# Patient Record
Sex: Female | Born: 1935 | Race: White | Hispanic: No | Smoking: Never smoker
Health system: Southern US, Community
[De-identification: ages and names within clinical notes are randomized; demographics above are authoritative.]

## PROBLEM LIST (undated history)

## (undated) DIAGNOSIS — F419 Anxiety disorder, unspecified: Secondary | ICD-10-CM

## (undated) DIAGNOSIS — I4729 Other ventricular tachycardia: Secondary | ICD-10-CM

## (undated) DIAGNOSIS — I5022 Chronic systolic (congestive) heart failure: Secondary | ICD-10-CM

## (undated) DIAGNOSIS — I472 Ventricular tachycardia, unspecified: Secondary | ICD-10-CM

## (undated) DIAGNOSIS — I255 Ischemic cardiomyopathy: Secondary | ICD-10-CM

## (undated) DIAGNOSIS — I10 Essential (primary) hypertension: Secondary | ICD-10-CM

## (undated) DIAGNOSIS — E785 Hyperlipidemia, unspecified: Secondary | ICD-10-CM

## (undated) DIAGNOSIS — K222 Esophageal obstruction: Secondary | ICD-10-CM

## (undated) DIAGNOSIS — I2510 Atherosclerotic heart disease of native coronary artery without angina pectoris: Secondary | ICD-10-CM

## (undated) DIAGNOSIS — M1990 Unspecified osteoarthritis, unspecified site: Secondary | ICD-10-CM

## (undated) DIAGNOSIS — E039 Hypothyroidism, unspecified: Secondary | ICD-10-CM

## (undated) DIAGNOSIS — K449 Diaphragmatic hernia without obstruction or gangrene: Secondary | ICD-10-CM

## (undated) DIAGNOSIS — Z950 Presence of cardiac pacemaker: Secondary | ICD-10-CM

## (undated) DIAGNOSIS — G4730 Sleep apnea, unspecified: Secondary | ICD-10-CM

## (undated) DIAGNOSIS — K5790 Diverticulosis of intestine, part unspecified, without perforation or abscess without bleeding: Secondary | ICD-10-CM

## (undated) DIAGNOSIS — I447 Left bundle-branch block, unspecified: Secondary | ICD-10-CM

## (undated) DIAGNOSIS — K219 Gastro-esophageal reflux disease without esophagitis: Secondary | ICD-10-CM

## (undated) DIAGNOSIS — N390 Urinary tract infection, site not specified: Secondary | ICD-10-CM

## (undated) HISTORY — PX: CARDIAC DEFIBRILLATOR PLACEMENT: SHX171

## (undated) HISTORY — PX: TONSILLECTOMY: SUR1361

## (undated) HISTORY — DX: Gastro-esophageal reflux disease without esophagitis: K21.9

## (undated) HISTORY — DX: Diverticulosis of intestine, part unspecified, without perforation or abscess without bleeding: K57.90

## (undated) HISTORY — DX: Hyperlipidemia, unspecified: E78.5

## (undated) HISTORY — DX: Esophageal obstruction: K22.2

## (undated) HISTORY — DX: Essential (primary) hypertension: I10

## (undated) HISTORY — PX: ANGIOPLASTY: SHX39

## (undated) HISTORY — PX: COLONOSCOPY W/ POLYPECTOMY: SHX1380

## (undated) HISTORY — DX: Unspecified osteoarthritis, unspecified site: M19.90

## (undated) HISTORY — PX: APPENDECTOMY: SHX54

## (undated) HISTORY — PX: TOTAL ABDOMINAL HYSTERECTOMY: SHX209

## (undated) HISTORY — PX: CATARACT EXTRACTION, BILATERAL: SHX1313

## (undated) HISTORY — DX: Ischemic cardiomyopathy: I25.5

## (undated) HISTORY — DX: Other ventricular tachycardia: I47.29

## (undated) HISTORY — PX: TOTAL KNEE ARTHROPLASTY: SHX125

## (undated) HISTORY — DX: Atherosclerotic heart disease of native coronary artery without angina pectoris: I25.10

## (undated) HISTORY — DX: Ventricular tachycardia, unspecified: I47.20

## (undated) HISTORY — PX: INSERT / REPLACE / REMOVE PACEMAKER: SUR710

## (undated) HISTORY — DX: Urinary tract infection, site not specified: N39.0

## (undated) HISTORY — DX: Ventricular tachycardia: I47.2

## (undated) HISTORY — PX: TUBAL LIGATION: SHX77

## (undated) HISTORY — DX: Diaphragmatic hernia without obstruction or gangrene: K44.9

## (undated) HISTORY — DX: Sleep apnea, unspecified: G47.30

## (undated) HISTORY — PX: CARDIAC CATHETERIZATION: SHX172

---

## 1997-06-01 ENCOUNTER — Inpatient Hospital Stay (HOSPITAL_COMMUNITY): Admission: RE | Admit: 1997-06-01 | Discharge: 1997-06-04 | Payer: Self-pay | Admitting: Orthopedic Surgery

## 1997-06-04 ENCOUNTER — Inpatient Hospital Stay (HOSPITAL_COMMUNITY)
Admission: RE | Admit: 1997-06-04 | Discharge: 1997-06-09 | Payer: Self-pay | Admitting: Physical Medicine and Rehabilitation

## 1997-06-14 ENCOUNTER — Other Ambulatory Visit: Admission: RE | Admit: 1997-06-14 | Discharge: 1997-06-14 | Payer: Self-pay | Admitting: Emergency Medicine

## 1997-06-18 ENCOUNTER — Other Ambulatory Visit: Admission: RE | Admit: 1997-06-18 | Discharge: 1997-06-18 | Payer: Self-pay

## 1997-09-22 ENCOUNTER — Ambulatory Visit (HOSPITAL_COMMUNITY): Admission: RE | Admit: 1997-09-22 | Discharge: 1997-09-23 | Payer: Self-pay | Admitting: Cardiology

## 1998-10-05 ENCOUNTER — Emergency Department (HOSPITAL_COMMUNITY): Admission: EM | Admit: 1998-10-05 | Discharge: 1998-10-05 | Payer: Self-pay | Admitting: Emergency Medicine

## 1998-10-06 ENCOUNTER — Encounter: Payer: Self-pay | Admitting: Emergency Medicine

## 1999-07-14 ENCOUNTER — Encounter: Payer: Self-pay | Admitting: Emergency Medicine

## 1999-07-14 ENCOUNTER — Encounter: Admission: RE | Admit: 1999-07-14 | Discharge: 1999-07-14 | Payer: Self-pay | Admitting: Emergency Medicine

## 2001-04-28 ENCOUNTER — Encounter: Admission: RE | Admit: 2001-04-28 | Discharge: 2001-04-28 | Payer: Self-pay | Admitting: Cardiology

## 2001-04-28 ENCOUNTER — Encounter: Payer: Self-pay | Admitting: Cardiology

## 2001-05-15 ENCOUNTER — Ambulatory Visit (HOSPITAL_COMMUNITY): Admission: RE | Admit: 2001-05-15 | Discharge: 2001-05-16 | Payer: Self-pay | Admitting: Cardiology

## 2001-05-30 ENCOUNTER — Ambulatory Visit (HOSPITAL_COMMUNITY): Admission: RE | Admit: 2001-05-30 | Discharge: 2001-05-30 | Payer: Self-pay | Admitting: Internal Medicine

## 2001-06-03 ENCOUNTER — Ambulatory Visit (HOSPITAL_COMMUNITY): Admission: RE | Admit: 2001-06-03 | Discharge: 2001-06-03 | Payer: Self-pay | Admitting: Internal Medicine

## 2001-06-03 ENCOUNTER — Encounter (INDEPENDENT_AMBULATORY_CARE_PROVIDER_SITE_OTHER): Payer: Self-pay | Admitting: *Deleted

## 2002-01-30 ENCOUNTER — Encounter: Payer: Self-pay | Admitting: Orthopedic Surgery

## 2002-02-03 ENCOUNTER — Inpatient Hospital Stay (HOSPITAL_COMMUNITY): Admission: RE | Admit: 2002-02-03 | Discharge: 2002-02-06 | Payer: Self-pay | Admitting: Orthopedic Surgery

## 2002-02-06 ENCOUNTER — Encounter: Payer: Self-pay | Admitting: Orthopedic Surgery

## 2002-05-12 ENCOUNTER — Encounter: Payer: Self-pay | Admitting: Emergency Medicine

## 2002-05-12 ENCOUNTER — Encounter: Admission: RE | Admit: 2002-05-12 | Discharge: 2002-05-12 | Payer: Self-pay | Admitting: Emergency Medicine

## 2002-10-09 ENCOUNTER — Encounter: Admission: RE | Admit: 2002-10-09 | Discharge: 2002-10-09 | Payer: Self-pay | Admitting: Emergency Medicine

## 2002-10-09 ENCOUNTER — Encounter: Payer: Self-pay | Admitting: Emergency Medicine

## 2002-11-04 ENCOUNTER — Encounter: Payer: Self-pay | Admitting: Internal Medicine

## 2002-11-04 ENCOUNTER — Ambulatory Visit (HOSPITAL_COMMUNITY): Admission: RE | Admit: 2002-11-04 | Discharge: 2002-11-04 | Payer: Self-pay | Admitting: Internal Medicine

## 2003-10-14 ENCOUNTER — Inpatient Hospital Stay (HOSPITAL_COMMUNITY): Admission: EM | Admit: 2003-10-14 | Discharge: 2003-10-16 | Payer: Self-pay

## 2003-11-16 ENCOUNTER — Ambulatory Visit: Payer: Self-pay | Admitting: Internal Medicine

## 2003-11-22 ENCOUNTER — Ambulatory Visit: Payer: Self-pay | Admitting: Internal Medicine

## 2003-12-10 ENCOUNTER — Ambulatory Visit: Payer: Self-pay | Admitting: Cardiology

## 2003-12-13 ENCOUNTER — Ambulatory Visit: Payer: Self-pay | Admitting: Internal Medicine

## 2003-12-14 ENCOUNTER — Inpatient Hospital Stay (HOSPITAL_COMMUNITY): Admission: AD | Admit: 2003-12-14 | Discharge: 2003-12-15 | Payer: Self-pay | Admitting: Internal Medicine

## 2003-12-22 ENCOUNTER — Ambulatory Visit: Payer: Self-pay | Admitting: Cardiology

## 2003-12-27 ENCOUNTER — Ambulatory Visit: Payer: Self-pay

## 2003-12-27 ENCOUNTER — Ambulatory Visit: Payer: Self-pay | Admitting: Internal Medicine

## 2004-02-23 ENCOUNTER — Ambulatory Visit: Payer: Self-pay | Admitting: Cardiology

## 2004-03-22 ENCOUNTER — Ambulatory Visit: Payer: Self-pay | Admitting: Internal Medicine

## 2004-04-14 ENCOUNTER — Ambulatory Visit: Payer: Self-pay

## 2004-07-13 ENCOUNTER — Ambulatory Visit: Payer: Self-pay | Admitting: Cardiology

## 2004-07-26 ENCOUNTER — Ambulatory Visit: Payer: Self-pay

## 2004-08-31 ENCOUNTER — Encounter (INDEPENDENT_AMBULATORY_CARE_PROVIDER_SITE_OTHER): Payer: Self-pay | Admitting: *Deleted

## 2004-08-31 ENCOUNTER — Encounter: Admission: RE | Admit: 2004-08-31 | Discharge: 2004-08-31 | Payer: Self-pay | Admitting: Emergency Medicine

## 2004-09-22 ENCOUNTER — Ambulatory Visit: Payer: Self-pay | Admitting: Cardiology

## 2004-10-06 ENCOUNTER — Ambulatory Visit: Payer: Self-pay | Admitting: Cardiology

## 2004-10-13 ENCOUNTER — Ambulatory Visit: Payer: Self-pay | Admitting: Cardiology

## 2004-10-20 ENCOUNTER — Ambulatory Visit: Payer: Self-pay | Admitting: Internal Medicine

## 2004-10-20 ENCOUNTER — Ambulatory Visit: Payer: Self-pay | Admitting: Cardiology

## 2004-11-03 ENCOUNTER — Ambulatory Visit: Payer: Self-pay | Admitting: Internal Medicine

## 2004-11-15 ENCOUNTER — Ambulatory Visit: Payer: Self-pay | Admitting: Cardiology

## 2004-12-01 ENCOUNTER — Ambulatory Visit: Payer: Self-pay | Admitting: Cardiology

## 2005-01-19 ENCOUNTER — Encounter: Admission: RE | Admit: 2005-01-19 | Discharge: 2005-01-19 | Payer: Self-pay | Admitting: Emergency Medicine

## 2005-01-19 ENCOUNTER — Encounter (INDEPENDENT_AMBULATORY_CARE_PROVIDER_SITE_OTHER): Payer: Self-pay | Admitting: *Deleted

## 2005-03-21 ENCOUNTER — Ambulatory Visit: Payer: Self-pay | Admitting: Cardiology

## 2005-03-30 ENCOUNTER — Ambulatory Visit: Payer: Self-pay | Admitting: Cardiology

## 2005-05-02 ENCOUNTER — Ambulatory Visit: Payer: Self-pay | Admitting: Cardiology

## 2005-06-14 ENCOUNTER — Ambulatory Visit: Payer: Self-pay | Admitting: Internal Medicine

## 2005-09-03 ENCOUNTER — Ambulatory Visit: Payer: Self-pay | Admitting: Cardiology

## 2005-11-19 ENCOUNTER — Ambulatory Visit: Payer: Self-pay | Admitting: Internal Medicine

## 2005-11-20 ENCOUNTER — Ambulatory Visit: Payer: Self-pay | Admitting: Internal Medicine

## 2005-12-20 ENCOUNTER — Ambulatory Visit: Payer: Self-pay | Admitting: Internal Medicine

## 2006-02-14 ENCOUNTER — Ambulatory Visit: Payer: Self-pay | Admitting: Internal Medicine

## 2006-02-14 ENCOUNTER — Ambulatory Visit: Payer: Self-pay | Admitting: Cardiology

## 2006-02-28 ENCOUNTER — Ambulatory Visit: Payer: Self-pay | Admitting: *Deleted

## 2006-02-28 LAB — CONVERTED CEMR LAB
ALT: 14 units/L (ref 0–40)
AST: 19 units/L (ref 0–37)
Albumin: 3.6 g/dL (ref 3.5–5.2)
Alkaline Phosphatase: 124 units/L — ABNORMAL HIGH (ref 39–117)
BUN: 32 mg/dL — ABNORMAL HIGH (ref 6–23)
Basophils Absolute: 0.1 10*3/uL (ref 0.0–0.1)
Basophils Relative: 0.8 % (ref 0.0–1.0)
Bilirubin, Direct: 0.1 mg/dL (ref 0.0–0.3)
CO2: 29 meq/L (ref 19–32)
Calcium: 9.9 mg/dL (ref 8.4–10.5)
Chloride: 106 meq/L (ref 96–112)
Cholesterol: 124 mg/dL (ref 0–200)
Creatinine, Ser: 1.1 mg/dL (ref 0.4–1.2)
Eosinophils Absolute: 0.5 10*3/uL (ref 0.0–0.6)
Eosinophils Relative: 5.7 % — ABNORMAL HIGH (ref 0.0–5.0)
GFR calc Af Amer: 61 mL/min
GFR calc non Af Amer: 51 mL/min
Glucose, Bld: 121 mg/dL — ABNORMAL HIGH (ref 70–99)
HCT: 31.7 % — ABNORMAL LOW (ref 36.0–46.0)
HDL: 31.1 mg/dL — ABNORMAL LOW (ref >39.0)
Hemoglobin: 10.8 g/dL — ABNORMAL LOW (ref 12.0–15.0)
LDL Cholesterol: 59 mg/dL (ref 0–99)
Lymphocytes Relative: 21 % (ref 12.0–46.0)
MCHC: 34.1 g/dL (ref 30.0–36.0)
MCV: 85.7 fL (ref 78.0–100.0)
Monocytes Absolute: 0.9 10*3/uL — ABNORMAL HIGH (ref 0.2–0.7)
Monocytes Relative: 11 % (ref 3.0–11.0)
Neutro Abs: 4.9 10*3/uL (ref 1.4–7.7)
Neutrophils Relative %: 61.5 % (ref 43.0–77.0)
Platelets: 247 10*3/uL (ref 150–400)
Potassium: 4.6 meq/L (ref 3.5–5.1)
Pro B Natriuretic peptide (BNP): 268 pg/mL — ABNORMAL HIGH (ref 0.0–100.0)
RBC: 3.71 M/uL — ABNORMAL LOW (ref 3.87–5.11)
RDW: 12.5 % (ref 11.5–14.6)
Sodium: 142 meq/L (ref 135–145)
TSH: 0.79 microintl units/mL (ref 0.35–5.50)
Total Bilirubin: 0.6 mg/dL (ref 0.3–1.2)
Total CHOL/HDL Ratio: 4
Total Protein: 6.4 g/dL (ref 6.0–8.3)
Triglycerides: 170 mg/dL — ABNORMAL HIGH (ref 0–149)
VLDL: 34 mg/dL (ref 0–40)
WBC: 8.1 10*3/uL (ref 4.5–10.5)

## 2006-05-15 ENCOUNTER — Ambulatory Visit: Payer: Self-pay | Admitting: *Deleted

## 2006-05-15 LAB — CONVERTED CEMR LAB
BUN: 29 mg/dL — ABNORMAL HIGH (ref 6–23)
Basophils Absolute: 0.1 10*3/uL (ref 0.0–0.1)
Basophils Relative: 1.5 % — ABNORMAL HIGH (ref 0.0–1.0)
CO2: 31 meq/L (ref 19–32)
Calcium: 9.9 mg/dL (ref 8.4–10.5)
Chloride: 108 meq/L (ref 96–112)
Creatinine, Ser: 0.9 mg/dL (ref 0.4–1.2)
Eosinophils Absolute: 0.6 10*3/uL (ref 0.0–0.6)
Eosinophils Relative: 9 % — ABNORMAL HIGH (ref 0.0–5.0)
GFR calc Af Amer: 77 mL/min
GFR calc non Af Amer: 64 mL/min
Glucose, Bld: 89 mg/dL (ref 70–99)
HCT: 32.7 % — ABNORMAL LOW (ref 36.0–46.0)
Hemoglobin: 11.1 g/dL — ABNORMAL LOW (ref 12.0–15.0)
Iron: 73 ug/dL (ref 42–145)
Lymphocytes Relative: 30.6 % (ref 12.0–46.0)
MCHC: 33.9 g/dL (ref 30.0–36.0)
MCV: 87 fL (ref 78.0–100.0)
Monocytes Absolute: 0.7 10*3/uL (ref 0.2–0.7)
Monocytes Relative: 11.2 % — ABNORMAL HIGH (ref 3.0–11.0)
Neutro Abs: 3 10*3/uL (ref 1.4–7.7)
Neutrophils Relative %: 47.7 % (ref 43.0–77.0)
Platelets: 294 10*3/uL (ref 150–400)
Potassium: 4.3 meq/L (ref 3.5–5.1)
RBC: 3.76 M/uL — ABNORMAL LOW (ref 3.87–5.11)
RDW: 12.8 % (ref 11.5–14.6)
Saturation Ratios: 23.8 % (ref 20.0–50.0)
Sodium: 144 meq/L (ref 135–145)
Transferrin: 218.7 mg/dL (ref >=212.0)
WBC: 6.4 10*3/uL (ref 4.5–10.5)

## 2006-06-05 ENCOUNTER — Encounter: Payer: Self-pay | Admitting: Cardiology

## 2006-06-05 ENCOUNTER — Ambulatory Visit: Payer: Self-pay

## 2006-06-07 ENCOUNTER — Ambulatory Visit: Payer: Self-pay

## 2006-06-12 ENCOUNTER — Ambulatory Visit: Payer: Self-pay | Admitting: Internal Medicine

## 2006-06-12 LAB — CONVERTED CEMR LAB
BUN: 51 mg/dL — ABNORMAL HIGH (ref 6–23)
CO2: 32 meq/L (ref 19–32)
Calcium: 9.7 mg/dL (ref 8.4–10.5)
Chloride: 100 meq/L (ref 96–112)
Creatinine, Ser: 1.3 mg/dL — ABNORMAL HIGH (ref 0.4–1.2)
GFR calc Af Amer: 50 mL/min
GFR calc non Af Amer: 42 mL/min
Glucose, Bld: 112 mg/dL — ABNORMAL HIGH (ref 70–99)
Potassium: 4.5 meq/L (ref 3.5–5.1)
Sodium: 138 meq/L (ref 135–145)

## 2006-07-09 ENCOUNTER — Ambulatory Visit: Payer: Self-pay | Admitting: Cardiology

## 2006-07-09 LAB — CONVERTED CEMR LAB
BUN: 47 mg/dL — ABNORMAL HIGH (ref 6–23)
Basophils Absolute: 0.1 10*3/uL (ref 0.0–0.1)
Basophils Relative: 0.8 % (ref 0.0–1.0)
CO2: 31 meq/L (ref 19–32)
Calcium: 9.1 mg/dL (ref 8.4–10.5)
Chloride: 104 meq/L (ref 96–112)
Creatinine, Ser: 1.3 mg/dL — ABNORMAL HIGH (ref 0.4–1.2)
Eosinophils Absolute: 0.6 10*3/uL (ref 0.0–0.6)
Eosinophils Relative: 7.6 % — ABNORMAL HIGH (ref 0.0–5.0)
GFR calc Af Amer: 50 mL/min
GFR calc non Af Amer: 42 mL/min
Glucose, Bld: 118 mg/dL — ABNORMAL HIGH (ref 70–99)
HCT: 31 % — ABNORMAL LOW (ref 36.0–46.0)
Hemoglobin: 10.4 g/dL — ABNORMAL LOW (ref 12.0–15.0)
Lymphocytes Relative: 21.8 % (ref 12.0–46.0)
MCHC: 33.5 g/dL (ref 30.0–36.0)
MCV: 85.6 fL (ref 78.0–100.0)
Monocytes Absolute: 0.7 10*3/uL (ref 0.2–0.7)
Monocytes Relative: 9.2 % (ref 3.0–11.0)
Neutro Abs: 4.8 10*3/uL (ref 1.4–7.7)
Neutrophils Relative %: 60.6 % (ref 43.0–77.0)
Platelets: 297 10*3/uL (ref 150–400)
Potassium: 4.1 meq/L (ref 3.5–5.1)
Pro B Natriuretic peptide (BNP): 153 pg/mL — ABNORMAL HIGH (ref 0.0–100.0)
RBC: 3.62 M/uL — ABNORMAL LOW (ref 3.87–5.11)
RDW: 12.3 % (ref 11.5–14.6)
Sodium: 140 meq/L (ref 135–145)
WBC: 7.9 10*3/uL (ref 4.5–10.5)

## 2006-08-19 ENCOUNTER — Ambulatory Visit: Payer: Self-pay

## 2006-09-13 ENCOUNTER — Ambulatory Visit: Payer: Self-pay | Admitting: Internal Medicine

## 2006-11-06 ENCOUNTER — Ambulatory Visit: Payer: Self-pay | Admitting: Cardiology

## 2007-01-30 ENCOUNTER — Ambulatory Visit: Payer: Self-pay | Admitting: Internal Medicine

## 2007-02-11 ENCOUNTER — Ambulatory Visit: Payer: Self-pay | Admitting: Cardiology

## 2007-02-11 LAB — CONVERTED CEMR LAB
BUN: 34 mg/dL — ABNORMAL HIGH (ref 6–23)
Basophils Absolute: 0.1 10*3/uL (ref 0.0–0.1)
Basophils Relative: 0.9 % (ref 0.0–1.0)
CO2: 30 meq/L (ref 19–32)
Calcium: 10.1 mg/dL (ref 8.4–10.5)
Chloride: 104 meq/L (ref 96–112)
Creatinine, Ser: 1.2 mg/dL (ref 0.4–1.2)
Eosinophils Absolute: 0.5 10*3/uL (ref 0.0–0.6)
Eosinophils Relative: 6.6 % — ABNORMAL HIGH (ref 0.0–5.0)
GFR calc Af Amer: 55 mL/min
GFR calc non Af Amer: 46 mL/min
Glucose, Bld: 114 mg/dL — ABNORMAL HIGH (ref 70–99)
HCT: 34.9 % — ABNORMAL LOW (ref 36.0–46.0)
Hemoglobin: 11.4 g/dL — ABNORMAL LOW (ref 12.0–15.0)
Lymphocytes Relative: 29.7 % (ref 12.0–46.0)
MCHC: 32.6 g/dL (ref 30.0–36.0)
MCV: 87.4 fL (ref 78.0–100.0)
Monocytes Absolute: 0.9 10*3/uL — ABNORMAL HIGH (ref 0.2–0.7)
Monocytes Relative: 12.1 % — ABNORMAL HIGH (ref 3.0–11.0)
Neutro Abs: 3.5 10*3/uL (ref 1.4–7.7)
Neutrophils Relative %: 50.7 % (ref 43.0–77.0)
Platelets: 227 10*3/uL (ref 150–400)
Potassium: 4.2 meq/L (ref 3.5–5.1)
RBC: 3.99 M/uL (ref 3.87–5.11)
RDW: 12.4 % (ref 11.5–14.6)
Sodium: 141 meq/L (ref 135–145)
WBC: 7.1 10*3/uL (ref 4.5–10.5)

## 2007-05-08 ENCOUNTER — Ambulatory Visit: Payer: Self-pay | Admitting: Internal Medicine

## 2007-07-10 ENCOUNTER — Ambulatory Visit: Payer: Self-pay | Admitting: Internal Medicine

## 2007-08-12 ENCOUNTER — Ambulatory Visit: Payer: Self-pay | Admitting: Cardiology

## 2007-08-12 LAB — CONVERTED CEMR LAB
BUN: 17 mg/dL (ref 6–23)
Basophils Absolute: 0.1 10*3/uL (ref 0.0–0.1)
Basophils Relative: 0.9 % (ref 0.0–3.0)
CO2: 29 meq/L (ref 19–32)
Calcium: 9.9 mg/dL (ref 8.4–10.5)
Chloride: 106 meq/L (ref 96–112)
Creatinine, Ser: 0.9 mg/dL (ref 0.4–1.2)
Eosinophils Absolute: 0.3 10*3/uL (ref 0.0–0.7)
Eosinophils Relative: 5.2 % — ABNORMAL HIGH (ref 0.0–5.0)
GFR calc Af Amer: 77 mL/min
GFR calc non Af Amer: 63 mL/min
Glucose, Bld: 116 mg/dL — ABNORMAL HIGH (ref 70–99)
HCT: 33.6 % — ABNORMAL LOW (ref 36.0–46.0)
Hemoglobin: 11.6 g/dL — ABNORMAL LOW (ref 12.0–15.0)
Lymphocytes Relative: 24.7 % (ref 12.0–46.0)
MCHC: 34.6 g/dL (ref 30.0–36.0)
MCV: 88.2 fL (ref 78.0–100.0)
Monocytes Absolute: 0.6 10*3/uL (ref 0.1–1.0)
Monocytes Relative: 10.1 % (ref 3.0–12.0)
Neutro Abs: 3.4 10*3/uL (ref 1.4–7.7)
Neutrophils Relative %: 59.1 % (ref 43.0–77.0)
Platelets: 236 10*3/uL (ref 150–400)
Potassium: 4.6 meq/L (ref 3.5–5.1)
RBC: 3.81 M/uL — ABNORMAL LOW (ref 3.87–5.11)
RDW: 12.4 % (ref 11.5–14.6)
Sodium: 141 meq/L (ref 135–145)
WBC: 5.8 10*3/uL (ref 4.5–10.5)

## 2007-10-22 DIAGNOSIS — E785 Hyperlipidemia, unspecified: Secondary | ICD-10-CM

## 2007-10-22 DIAGNOSIS — I10 Essential (primary) hypertension: Secondary | ICD-10-CM | POA: Insufficient documentation

## 2007-10-22 DIAGNOSIS — I5022 Chronic systolic (congestive) heart failure: Secondary | ICD-10-CM

## 2007-10-22 DIAGNOSIS — I255 Ischemic cardiomyopathy: Secondary | ICD-10-CM | POA: Insufficient documentation

## 2007-11-15 ENCOUNTER — Emergency Department (HOSPITAL_COMMUNITY): Admission: EM | Admit: 2007-11-15 | Discharge: 2007-11-15 | Payer: Self-pay | Admitting: Emergency Medicine

## 2007-12-15 ENCOUNTER — Ambulatory Visit: Payer: Self-pay | Admitting: Internal Medicine

## 2008-01-02 ENCOUNTER — Observation Stay (HOSPITAL_COMMUNITY): Admission: EM | Admit: 2008-01-02 | Discharge: 2008-01-02 | Payer: Self-pay | Admitting: *Deleted

## 2008-01-02 ENCOUNTER — Ambulatory Visit: Payer: Self-pay | Admitting: Internal Medicine

## 2008-01-14 ENCOUNTER — Ambulatory Visit: Payer: Self-pay

## 2008-01-23 ENCOUNTER — Ambulatory Visit: Payer: Self-pay | Admitting: Cardiology

## 2008-03-18 ENCOUNTER — Encounter: Payer: Self-pay | Admitting: Internal Medicine

## 2008-03-29 ENCOUNTER — Ambulatory Visit: Payer: Self-pay | Admitting: Internal Medicine

## 2008-08-04 ENCOUNTER — Ambulatory Visit: Payer: Self-pay | Admitting: Cardiology

## 2008-08-05 LAB — CONVERTED CEMR LAB
BUN: 24 mg/dL — ABNORMAL HIGH (ref 6–23)
Basophils Absolute: 0 10*3/uL (ref 0.0–0.1)
Basophils Relative: 0.6 % (ref 0.0–3.0)
CO2: 32 meq/L (ref 19–32)
Calcium: 9.7 mg/dL (ref 8.4–10.5)
Chloride: 107 meq/L (ref 96–112)
Creatinine, Ser: 1.1 mg/dL (ref 0.4–1.2)
Eosinophils Absolute: 0.4 10*3/uL (ref 0.0–0.7)
Eosinophils Relative: 6.4 % — ABNORMAL HIGH (ref 0.0–5.0)
GFR calc non Af Amer: 50.14 mL/min (ref >60)
Glucose, Bld: 91 mg/dL (ref 70–99)
HCT: 35.1 % — ABNORMAL LOW (ref 36.0–46.0)
Hemoglobin: 12.3 g/dL (ref 12.0–15.0)
Lymphocytes Relative: 26.7 % (ref 12.0–46.0)
Lymphs Abs: 1.8 10*3/uL (ref 0.7–4.0)
MCHC: 35.1 g/dL (ref 30.0–36.0)
MCV: 88.8 fL (ref 78.0–100.0)
Monocytes Absolute: 0.9 10*3/uL (ref 0.1–1.0)
Monocytes Relative: 13.3 % — ABNORMAL HIGH (ref 3.0–12.0)
Neutro Abs: 3.5 10*3/uL (ref 1.4–7.7)
Neutrophils Relative %: 53 % (ref 43.0–77.0)
Platelets: 186 10*3/uL (ref 150.0–400.0)
Potassium: 4.4 meq/L (ref 3.5–5.1)
RBC: 3.95 M/uL (ref 3.87–5.11)
RDW: 12.1 % (ref 11.5–14.6)
Sodium: 143 meq/L (ref 135–145)
WBC: 6.6 10*3/uL (ref 4.5–10.5)

## 2008-08-31 ENCOUNTER — Encounter: Payer: Self-pay | Admitting: Nurse Practitioner

## 2008-09-01 ENCOUNTER — Telehealth: Payer: Self-pay | Admitting: Internal Medicine

## 2008-09-01 ENCOUNTER — Encounter: Payer: Self-pay | Admitting: Nurse Practitioner

## 2008-09-03 ENCOUNTER — Ambulatory Visit: Payer: Self-pay | Admitting: Gastroenterology

## 2008-09-03 DIAGNOSIS — R634 Abnormal weight loss: Secondary | ICD-10-CM

## 2008-09-03 DIAGNOSIS — R1084 Generalized abdominal pain: Secondary | ICD-10-CM

## 2008-09-03 DIAGNOSIS — K222 Esophageal obstruction: Secondary | ICD-10-CM

## 2008-09-03 DIAGNOSIS — R110 Nausea: Secondary | ICD-10-CM

## 2008-09-20 ENCOUNTER — Ambulatory Visit: Payer: Self-pay | Admitting: Internal Medicine

## 2008-11-05 ENCOUNTER — Telehealth: Payer: Self-pay | Admitting: Internal Medicine

## 2008-11-08 ENCOUNTER — Ambulatory Visit: Payer: Self-pay | Admitting: Internal Medicine

## 2008-11-29 ENCOUNTER — Telehealth: Payer: Self-pay | Admitting: Cardiology

## 2008-12-09 ENCOUNTER — Telehealth (INDEPENDENT_AMBULATORY_CARE_PROVIDER_SITE_OTHER): Payer: Self-pay

## 2008-12-13 ENCOUNTER — Ambulatory Visit: Payer: Self-pay

## 2008-12-13 ENCOUNTER — Encounter (HOSPITAL_COMMUNITY): Admission: RE | Admit: 2008-12-13 | Discharge: 2009-01-05 | Payer: Self-pay | Admitting: Cardiology

## 2008-12-13 ENCOUNTER — Ambulatory Visit: Payer: Self-pay | Admitting: Cardiology

## 2008-12-22 ENCOUNTER — Emergency Department (HOSPITAL_COMMUNITY): Admission: EM | Admit: 2008-12-22 | Discharge: 2008-12-23 | Payer: Self-pay | Admitting: Emergency Medicine

## 2008-12-22 ENCOUNTER — Ambulatory Visit: Payer: Self-pay | Admitting: Cardiology

## 2008-12-24 ENCOUNTER — Telehealth (INDEPENDENT_AMBULATORY_CARE_PROVIDER_SITE_OTHER): Payer: Self-pay | Admitting: *Deleted

## 2008-12-24 ENCOUNTER — Ambulatory Visit: Payer: Self-pay | Admitting: Cardiology

## 2008-12-24 LAB — CONVERTED CEMR LAB
ALT: 191 units/L — ABNORMAL HIGH (ref 0–35)
AST: 182 units/L — ABNORMAL HIGH (ref 0–37)
Albumin: 4.1 g/dL (ref 3.5–5.2)
Alkaline Phosphatase: 189 units/L — ABNORMAL HIGH (ref 39–117)
BUN: 14 mg/dL (ref 6–23)
Basophils Absolute: 0.1 10*3/uL (ref 0.0–0.1)
Basophils Relative: 1 % (ref 0.0–3.0)
Bilirubin, Direct: 0 mg/dL (ref 0.0–0.3)
CO2: 28 meq/L (ref 19–32)
Calcium: 10.3 mg/dL (ref 8.4–10.5)
Chloride: 103 meq/L (ref 96–112)
Cholesterol: 115 mg/dL (ref 0–200)
Creatinine, Ser: 0.9 mg/dL (ref 0.4–1.2)
Eosinophils Absolute: 0.5 10*3/uL (ref 0.0–0.7)
Eosinophils Relative: 8 % — ABNORMAL HIGH (ref 0.0–5.0)
GFR calc non Af Amer: 63.15 mL/min (ref >60)
Glucose, Bld: 119 mg/dL — ABNORMAL HIGH (ref 70–99)
HCT: 37.9 % (ref 36.0–46.0)
HDL: 36.6 mg/dL — ABNORMAL LOW (ref >39.00)
Hemoglobin: 12.5 g/dL (ref 12.0–15.0)
LDL Cholesterol: 58 mg/dL (ref 0–99)
Lymphocytes Relative: 19.9 % (ref 12.0–46.0)
Lymphs Abs: 1.2 10*3/uL (ref 0.7–4.0)
MCHC: 32.9 g/dL (ref 30.0–36.0)
MCV: 93.3 fL (ref 78.0–100.0)
Monocytes Absolute: 0.7 10*3/uL (ref 0.1–1.0)
Monocytes Relative: 11.9 % (ref 3.0–12.0)
Neutro Abs: 3.6 10*3/uL (ref 1.4–7.7)
Neutrophils Relative %: 59.2 % (ref 43.0–77.0)
Platelets: 176 10*3/uL (ref 150.0–400.0)
Potassium: 4.1 meq/L (ref 3.5–5.1)
RBC: 4.06 M/uL (ref 3.87–5.11)
RDW: 12.9 % (ref 11.5–14.6)
Sodium: 138 meq/L (ref 135–145)
Total Bilirubin: 0.9 mg/dL (ref 0.3–1.2)
Total CHOL/HDL Ratio: 3
Total Protein: 6.9 g/dL (ref 6.0–8.3)
Triglycerides: 103 mg/dL (ref 0.0–149.0)
VLDL: 20.6 mg/dL (ref 0.0–40.0)
WBC: 6.1 10*3/uL (ref 4.5–10.5)

## 2008-12-28 ENCOUNTER — Encounter: Payer: Self-pay | Admitting: Cardiology

## 2009-01-24 ENCOUNTER — Ambulatory Visit: Payer: Self-pay | Admitting: Cardiology

## 2009-01-25 ENCOUNTER — Telehealth: Payer: Self-pay | Admitting: Cardiology

## 2009-01-25 ENCOUNTER — Telehealth (INDEPENDENT_AMBULATORY_CARE_PROVIDER_SITE_OTHER): Payer: Self-pay | Admitting: *Deleted

## 2009-01-26 ENCOUNTER — Telehealth: Payer: Self-pay | Admitting: Cardiology

## 2009-01-26 ENCOUNTER — Ambulatory Visit: Payer: Self-pay | Admitting: Cardiovascular Disease

## 2009-01-27 ENCOUNTER — Encounter: Admission: RE | Admit: 2009-01-27 | Discharge: 2009-01-27 | Payer: Self-pay | Admitting: Emergency Medicine

## 2009-01-28 ENCOUNTER — Telehealth: Payer: Self-pay | Admitting: Cardiology

## 2009-01-28 LAB — CONVERTED CEMR LAB
ALT: 20 units/L (ref 0–35)
AST: 27 units/L (ref 0–37)
Albumin: 4.1 g/dL (ref 3.5–5.2)
Alkaline Phosphatase: 87 units/L (ref 39–117)
Bilirubin, Direct: 0.1 mg/dL (ref 0.0–0.3)
Cholesterol: 162 mg/dL (ref 0–200)
HDL: 31.5 mg/dL — ABNORMAL LOW (ref >39.00)
LDL Cholesterol: 105 mg/dL — ABNORMAL HIGH (ref 0–99)
Total Bilirubin: 0.4 mg/dL (ref 0.3–1.2)
Total CHOL/HDL Ratio: 5
Total Protein: 7 g/dL (ref 6.0–8.3)
Triglycerides: 129 mg/dL (ref 0.0–149.0)
VLDL: 25.8 mg/dL (ref 0.0–40.0)

## 2009-02-03 ENCOUNTER — Telehealth: Payer: Self-pay | Admitting: Cardiology

## 2009-02-03 ENCOUNTER — Encounter: Payer: Self-pay | Admitting: Infectious Diseases

## 2009-02-07 ENCOUNTER — Encounter: Payer: Self-pay | Admitting: Cardiology

## 2009-02-07 ENCOUNTER — Telehealth: Payer: Self-pay | Admitting: Cardiology

## 2009-02-21 ENCOUNTER — Telehealth: Payer: Self-pay | Admitting: Cardiology

## 2009-02-21 ENCOUNTER — Ambulatory Visit: Payer: Self-pay | Admitting: Cardiology

## 2009-02-23 LAB — CONVERTED CEMR LAB
ALT: 19 units/L (ref 0–35)
AST: 26 units/L (ref 0–37)
Albumin: 4.1 g/dL (ref 3.5–5.2)
Alkaline Phosphatase: 113 units/L (ref 39–117)
Bilirubin, Direct: 0 mg/dL (ref 0.0–0.3)
Cholesterol: 117 mg/dL (ref 0–200)
HDL: 38.4 mg/dL — ABNORMAL LOW (ref >39.00)
LDL Cholesterol: 60 mg/dL (ref 0–99)
Total Bilirubin: 0.5 mg/dL (ref 0.3–1.2)
Total CHOL/HDL Ratio: 3
Total Protein: 6.9 g/dL (ref 6.0–8.3)
Triglycerides: 94 mg/dL (ref 0.0–149.0)
VLDL: 18.8 mg/dL (ref 0.0–40.0)

## 2009-02-25 ENCOUNTER — Encounter (INDEPENDENT_AMBULATORY_CARE_PROVIDER_SITE_OTHER): Payer: Self-pay | Admitting: *Deleted

## 2009-02-25 DIAGNOSIS — R12 Heartburn: Secondary | ICD-10-CM

## 2009-02-25 DIAGNOSIS — N393 Stress incontinence (female) (male): Secondary | ICD-10-CM

## 2009-02-25 DIAGNOSIS — Z87440 Personal history of urinary (tract) infections: Secondary | ICD-10-CM

## 2009-02-25 DIAGNOSIS — N952 Postmenopausal atrophic vaginitis: Secondary | ICD-10-CM

## 2009-02-25 DIAGNOSIS — N3020 Other chronic cystitis without hematuria: Secondary | ICD-10-CM | POA: Insufficient documentation

## 2009-02-25 DIAGNOSIS — Z9089 Acquired absence of other organs: Secondary | ICD-10-CM

## 2009-02-25 DIAGNOSIS — Z9889 Other specified postprocedural states: Secondary | ICD-10-CM

## 2009-02-25 DIAGNOSIS — Z8739 Personal history of other diseases of the musculoskeletal system and connective tissue: Secondary | ICD-10-CM

## 2009-03-01 ENCOUNTER — Ambulatory Visit: Payer: Self-pay | Admitting: Infectious Diseases

## 2009-03-02 ENCOUNTER — Encounter: Payer: Self-pay | Admitting: Infectious Diseases

## 2009-04-11 ENCOUNTER — Ambulatory Visit: Payer: Self-pay | Admitting: Internal Medicine

## 2009-04-11 DIAGNOSIS — Z8679 Personal history of other diseases of the circulatory system: Secondary | ICD-10-CM | POA: Insufficient documentation

## 2009-05-13 ENCOUNTER — Telehealth: Payer: Self-pay | Admitting: Infectious Diseases

## 2009-05-13 ENCOUNTER — Emergency Department (HOSPITAL_COMMUNITY): Admission: EM | Admit: 2009-05-13 | Discharge: 2009-05-13 | Payer: Self-pay | Admitting: Emergency Medicine

## 2009-06-16 ENCOUNTER — Ambulatory Visit: Payer: Self-pay | Admitting: Cardiology

## 2009-06-16 ENCOUNTER — Encounter: Payer: Self-pay | Admitting: Internal Medicine

## 2009-06-27 LAB — CONVERTED CEMR LAB
BUN: 19 mg/dL (ref 6–23)
Basophils Absolute: 0 10*3/uL (ref 0.0–0.1)
Basophils Relative: 0.5 % (ref 0.0–3.0)
CO2: 31 meq/L (ref 19–32)
Calcium: 10.4 mg/dL (ref 8.4–10.5)
Chloride: 101 meq/L (ref 96–112)
Creatinine, Ser: 0.9 mg/dL (ref 0.4–1.2)
Eosinophils Absolute: 0.2 10*3/uL (ref 0.0–0.7)
Eosinophils Relative: 3.4 % (ref 0.0–5.0)
GFR calc non Af Amer: 64.74 mL/min (ref >60)
Glucose, Bld: 109 mg/dL — ABNORMAL HIGH (ref 70–99)
HCT: 39.1 % (ref 36.0–46.0)
Hemoglobin: 13.3 g/dL (ref 12.0–15.0)
Lymphocytes Relative: 26.5 % (ref 12.0–46.0)
Lymphs Abs: 1.8 10*3/uL (ref 0.7–4.0)
MCHC: 34.1 g/dL (ref 30.0–36.0)
MCV: 90.5 fL (ref 78.0–100.0)
Magnesium: 2.4 mg/dL (ref 1.5–2.5)
Monocytes Absolute: 0.7 10*3/uL (ref 0.1–1.0)
Monocytes Relative: 10.6 % (ref 3.0–12.0)
Neutro Abs: 4 10*3/uL (ref 1.4–7.7)
Neutrophils Relative %: 59 % (ref 43.0–77.0)
Platelets: 232 10*3/uL (ref 150.0–400.0)
Potassium: 4.7 meq/L (ref 3.5–5.1)
Pro B Natriuretic peptide (BNP): 310 pg/mL — ABNORMAL HIGH (ref 0.0–100.0)
RBC: 4.32 M/uL (ref 3.87–5.11)
RDW: 13.6 % (ref 11.5–14.6)
Sodium: 140 meq/L (ref 135–145)
WBC: 6.7 10*3/uL (ref 4.5–10.5)

## 2009-08-12 ENCOUNTER — Encounter: Payer: Self-pay | Admitting: Internal Medicine

## 2009-09-16 ENCOUNTER — Telehealth: Payer: Self-pay | Admitting: Cardiology

## 2009-09-19 ENCOUNTER — Ambulatory Visit: Payer: Self-pay | Admitting: Infectious Diseases

## 2009-10-14 ENCOUNTER — Ambulatory Visit: Payer: Self-pay | Admitting: Internal Medicine

## 2009-10-18 ENCOUNTER — Encounter: Admission: RE | Admit: 2009-10-18 | Discharge: 2009-10-18 | Payer: Self-pay | Admitting: Emergency Medicine

## 2009-10-27 ENCOUNTER — Ambulatory Visit: Payer: Self-pay | Admitting: Internal Medicine

## 2009-10-31 ENCOUNTER — Telehealth: Payer: Self-pay | Admitting: Internal Medicine

## 2009-11-11 ENCOUNTER — Encounter: Payer: Self-pay | Admitting: Internal Medicine

## 2009-11-11 ENCOUNTER — Ambulatory Visit: Payer: Self-pay | Admitting: Cardiology

## 2009-11-14 ENCOUNTER — Telehealth: Payer: Self-pay | Admitting: Internal Medicine

## 2009-11-14 ENCOUNTER — Ambulatory Visit: Payer: Self-pay | Admitting: Internal Medicine

## 2009-11-14 DIAGNOSIS — I472 Ventricular tachycardia, unspecified: Secondary | ICD-10-CM | POA: Insufficient documentation

## 2009-11-14 LAB — CONVERTED CEMR LAB
ALT: 14 units/L (ref 0–35)
AST: 23 units/L (ref 0–37)
Albumin: 4.1 g/dL (ref 3.5–5.2)
Alkaline Phosphatase: 101 units/L (ref 39–117)
BUN: 23 mg/dL (ref 6–23)
Basophils Absolute: 0 10*3/uL (ref 0.0–0.1)
Basophils Relative: 0.5 % (ref 0.0–3.0)
Bilirubin, Direct: 0.1 mg/dL (ref 0.0–0.3)
CO2: 28 meq/L (ref 19–32)
Calcium: 10.2 mg/dL (ref 8.4–10.5)
Chloride: 105 meq/L (ref 96–112)
Creatinine, Ser: 0.9 mg/dL (ref 0.4–1.2)
Eosinophils Absolute: 0.3 10*3/uL (ref 0.0–0.7)
Eosinophils Relative: 5.1 % — ABNORMAL HIGH (ref 0.0–5.0)
GFR calc non Af Amer: 64.67 mL/min (ref >60)
Glucose, Bld: 104 mg/dL — ABNORMAL HIGH (ref 70–99)
HCT: 37.5 % (ref 36.0–46.0)
Hemoglobin: 12.8 g/dL (ref 12.0–15.0)
INR: 0.9 (ref 0.8–1.0)
Lymphocytes Relative: 26.7 % (ref 12.0–46.0)
Lymphs Abs: 1.8 10*3/uL (ref 0.7–4.0)
MCHC: 34.2 g/dL (ref 30.0–36.0)
MCV: 93 fL (ref 78.0–100.0)
Monocytes Absolute: 0.8 10*3/uL (ref 0.1–1.0)
Monocytes Relative: 11.4 % (ref 3.0–12.0)
Neutro Abs: 3.9 10*3/uL (ref 1.4–7.7)
Neutrophils Relative %: 56.3 % (ref 43.0–77.0)
Platelets: 200 10*3/uL (ref 150.0–400.0)
Potassium: 4.6 meq/L (ref 3.5–5.1)
Prothrombin Time: 9.9 s (ref 9.7–11.8)
RBC: 4.03 M/uL (ref 3.87–5.11)
RDW: 14 % (ref 11.5–14.6)
Sodium: 141 meq/L (ref 135–145)
Total Bilirubin: 0.7 mg/dL (ref 0.3–1.2)
Total Protein: 6.6 g/dL (ref 6.0–8.3)
WBC: 6.8 10*3/uL (ref 4.5–10.5)
aPTT: 27.3 s (ref 21.7–28.8)

## 2009-11-16 ENCOUNTER — Ambulatory Visit: Payer: Self-pay | Admitting: Internal Medicine

## 2009-11-16 ENCOUNTER — Ambulatory Visit (HOSPITAL_COMMUNITY): Admission: RE | Admit: 2009-11-16 | Discharge: 2009-11-16 | Payer: Self-pay | Admitting: Internal Medicine

## 2009-11-18 ENCOUNTER — Encounter: Payer: Self-pay | Admitting: Internal Medicine

## 2009-11-30 ENCOUNTER — Ambulatory Visit: Payer: Self-pay | Admitting: Internal Medicine

## 2010-02-09 NOTE — Cardiovascular Report (Signed)
Summary: Pre Op Orders   Pre Op Orders   Imported By: Roderic Ovens 11/09/2009 11:47:18  _____________________________________________________________________  External Attachment:    Type:   Image     Comment:   External Document

## 2010-02-09 NOTE — Letter (Signed)
Summary: Implantable Device Instructions  Architectural technologist, Main Office  [address]   [place], [place] [address]   Phone: [phone]  Fax: [phone]      Implantable Device Instructions  You are scheduled for:   ___x__ Generator Change-CRT-D to CRT-P  on November 16, 2009 at 10:45 am with Dr. Graciela Husbands.  1.  Please arrive at the Short Stay Center at [facility] at 8:45 am on the day of your procedure.  2.  Do not eat or drink after midnight. May take pills with a sip of water in the morning.  3.  Complete lab work on November 14, 2009 at 8:30.  The lab at [facility] is open from 8:30 AM to 1:30 PM and from 2:30 PM to 5:00 PM.  The lab at [facility] is open from 7:30 AM to 5:30 PM.  You do not have to be fasting.   4.  Plan for an overnight stay.  Bring your insurance cards and a list of your medications.  5.  Wash your chest and neck with antibacterial soap (any brand) the evening before and the morning of your procedure.  Rinse well.    *If you have ANY questions after you get home, please call the office [phone]. Claris Gladden, RN  *Every attempt is made to prevent procedures from being rescheduled.  Due to the nauture of Electrophysiology, rescheduling can happen.  The physician is always aware and directs the staff when this occurs.

## 2010-02-09 NOTE — Miscellaneous (Signed)
Summary: HIPAA Restrictions  HIPAA Restrictions   Imported By: Florinda Marker 03/02/2009 14:28:17  _____________________________________________________________________  External Attachment:    Type:   Image     Comment:   External Document

## 2010-02-09 NOTE — Cardiovascular Report (Signed)
Summary: Office Visit   Office Visit   Imported By: Roderic Ovens 10/28/2009 09:28:00  _____________________________________________________________________  External Attachment:    Type:   Image     Comment:   External Document

## 2010-02-09 NOTE — Assessment & Plan Note (Signed)
Summary: rov/ pacer @ [facility]/jml  Medications Added HYDROXYZINE HCL 25 MG TABS (HYDROXYZINE HCL) 1 tablet as needed      Allergies Added:   Referring Provider:  n/a Primary Provider:  Leslee Home, MD    CC:  rov/pacer at [facility].  History of Present Illness: 1. Coronary artery disease with prior myocardial infarction. 2. Congestive heart failure - chronic systolic. 3. Status post cardiac resynchronization therapy - implantable     cardioverter-defibrillator.  Mrs. tachycardia is seen in followup for coronary disease with prior myocardial infarction compensated by congestive heart failure-systolic. She is status post CRT implantation. There isn't intercurrent improvement in her left ventricular function up to about 45% following CRT. Her most recent   Myoview scan  from a fall 2010showed no evidence of ischemia and an ejection fraction of 54%.  She has had no recent chest pain or palpitations. She feels her exercise tolerance has been well-maintained.  Or other major issue has been syncope which was attributed to orthostatic intolerance. Her symptoms have been ameliorated by the decrease in her diuretics.  Current Medications (verified): 1)  Metamucil 48.57 % Powd (Psyllium) .... Daily 2)  Aspirin 81 Mg Tbec (Aspirin) .... Take One Tablet By Mouth Daily 3)  Prevacid 30 Mg Cpdr (Lansoprazole) .... One Tablet By Mouth Once Daily 4)  Furosemide 40 Mg Tabs (Furosemide) .... 1/2 Tab By Mouth Once Daily 5)  Toprol Xl 50 Mg Xr24h-Tab (Metoprolol Succinate) .[name].. 1 Tab Two Times A Day 6)  Ramipril 5 Mg Caps (Ramipril) .... Take One Capsule By Mouth Daily 7)  Isosorbide Mononitrate Cr 30 Mg Xr24h-Tab (Isosorbide Mononitrate) .... Take One Tablet By Mouth Daily 8)  Hydroxyzine Hcl 25 Mg Tabs (Hydroxyzine Hcl) .[name].. 1 Tablet As Needed 9)  Levothroid 125 Mcg Tabs (Levothyroxine Sodium) .[name].. 1 Tab Once Daily 10)  Tranxene-T 3.75 Mg Tabs (Clorazepate Dipotassium) .[name].. 1 Tab Prn 11)  Pravastatin Sodium  20 Mg Tabs (Pravastatin Sodium) .... Take One Tablet By Mouth Daily At Bedtime 12)  Estrace 0.1 Mg/gm Crea (Estradiol) .... Apply Twice Weekly Per Pcp  Allergies (verified): 1)  ! Sulfa  Past History:  Past Medical History: Last updated: 04/09/2009 1. Recent hospitalization for chest pain 12/2007 felt to be noncardiac. 2. Coronary artery disease status post prior anterior wall myocardial     infarction and multiple percutaneous coronary intervention. 3. Ischemic cardiomyopathy, ejection fraction 25-30% improved to 45%     with biventricular pacing. 4. Systolic heart failure improved to class I to II. 5. Status post implantable cardioverter-defibrillator biventricular     pacer (Dec 2005).-- St. Jude Atlas 6. Hypertension. 7. Hyperlipidemia. 8. Gastroesophageal reflux disease.  9. Esophageal Stricture, last dilated 2007 10. Hiatal Hernia 11. History of severe diverticular disease, left colon 2004. 12. Urinary Incontenance 13. Recurrent UTIs (Klebsiella pneumoniae)     Last Cx 02-03-09 (R- ancef, Nitrofurantoin, Augmentin, Zosyn)  Past Surgical History: Last updated: 04/09/2009 Angioplasty/Stent Hysterectomy Tubal Ligation Appendectomy St. Jude Atlas-implantation-2005  Family History: Last updated: 09/15/2008 No FH of Colon Cancer: Family History of Colon Polyps: Sister Family History of Heart Disease: Mother, Father, Brothers, Sisters Family History of Prostate Cancer: Brother Family History of Diabetes: Aunt, Daughter  Social History: Last updated: 09/15/2008 SOCIAL HISTORY:  Ms. Frick recently lost her husband in 2009 also lost the son to brain tumor.  She has a son who lives in [place], [place] who was in an automobile accident for DWI.  He has had a long history of alcohol problem.  She has helped him out financially in the past, but is ready to be much stricter. She has a daughter who lives in [place] with breast Ca. Patient has never smoked.  Alcohol Use -  no  Vital Signs:  Patient profile:   77 year old female Height:      62 inches Weight:      143 pounds Pulse rate:   80 / minute Pulse rhythm:   regular BP sitting:   108 / 56  (left arm) Cuff size:   regular  Vitals Entered By: Judithe Modest CMA (April 11, 2009 9:43 AM)  Physical Exam  General:  Well developed, well nourished, in no acute distress. Head:  normal HEENT Neck:  supple with flat neck veins Lungs:  clear to auscultation Heart:  irregular rhythm with normal S1 and S2. There is an early systolic murmur Abdomen:  soft nontender with active bowel Msk:  No major deformities. She does walk with a cane because of back issue Extremities:  without clubbing cyanosis or edema    ICD Specifications Following MD:  Sherryl Manges, MD     ICD Vendor:  St Jude     ICD Model Number:  [phone]     ICD Serial Number:  [id number] ICD DOI:  12/14/2003     ICD Implanting MD:  Sherryl Manges, MD  Lead 1:    Location: RA     DOI: 12/14/2003     Model #: 1488TC     Serial #: [id number]     Status: active Lead 2:    Location: RV     DOI: 12/14/2003     Model #: 1581     Serial #: [id number]     Status: active Lead 3:    Location: LV     DOI: 12/14/2003     Model #: 1056T     Serial #: [id number]     Status: active  Indications::  ICM, CHF   ICD Follow Up Remote Check?  No Battery Voltage:  2.54 V     Charge Time:  13.1 seconds     Underlying rhythm:  SR WITH FREQUENT PVC'S ICD Dependent:  No       ICD Device Measurements Atrium:  Amplitude: 3.0 mV, Impedance: 400 ohms, Threshold: 0.5 V at 0.5 msec Right Ventricle:  Amplitude: 7.9 mV, Impedance: 340 ohms, Threshold: 1.25 V at 0.5 msec Left Ventricle:  Impedance: 570 ohms, Threshold: 1.0 V at 0.8 msec Configuration: BIPOLAR Shock Impedance: 36 ohms   Episodes MS Episodes:  1     Percent Mode Switch:  <1%     Coumadin:  No Shock:  0     ATP:  0     Nonsustained:  0     Atrial Pacing:  1%     Ventricular Pacing:  93%  Brady Parameters Mode DDD      Lower Rate Limit:  50     Upper Rate Limit 120 PAV 170     Sensed AV Delay:  120  Tachy Zones VF:  222     VT:  200     VT1:  164     Next Remote Date:  06/13/2009     Next Cardiology Appt Due:  04/10/2010 Tech Comments:  Normal device function.  Pt with frequent PVC's during check today.  Mode switch episode was less than 1 minute. 1 episode of PMT, VA conduction time around 188 msec.  PVARP programmed at .  Since battery voltage less than 2.55, will start checks every 8 weeks.  ROV 12 months SK. Gypsy Balsam RN BSN  April 11, 2009 10:06 AM   Impression & Recommendations:  Problem # 1:  SYSTOLIC HEART FAILURE, CHRONIC (ICD-428.22) She isstable on her current medications. Adjustments were required because of her orthostatic syncope.catheters also been significant intercurrent improvement following CRT implantation Her updated medication list for this problem includes:    Aspirin 81 Mg Tbec (Aspirin) .[name]... Take one tablet by mouth daily    Furosemide 40 Mg Tabs (Furosemide) .[name]... 1/2 tab by mouth once daily    Toprol Xl 50 Mg Xr24h-tab (Metoprolol succinate) .[name]... 1 tab two times a day    Ramipril 5 Mg Caps (Ramipril) .[name]... Take one capsule by mouth daily    Isosorbide Mononitrate Cr 30 Mg Xr24h-tab (Isosorbide mononitrate) .[name]... Take one tablet by mouth daily  Problem # 2:  CARDIOMYOPATHY, ISCHEMIC (ICD-414.8) stable with a negative Myoview in the fall  Problem # 3:  HYPOTENSION, ORTHOSTATIC, HX OF (ICD-V12.50) reviewed the history with her. I suggested that she increase the head of her bed elevation to 6 inches. As most of her symptoms have been arising from bed  Problem # 4:  IMPLANTABLE  DEFIBRILLATOR  CRT ST J (ICD-V45.02) Device parameters and data were reviewed and no changes were made  Other Orders: EKG w/ Interpretation (93000)  Patient Instructions: 1)  You are scheduled for a device check from home on June 13, 2009. You may send your transmission at any time that day.  If you have a wireless device, the transmission will be sent automatically. After your physician reviews your transmission, you will receive a postcard with your next transmission date. 2)  Your physician wants you to follow-up in:  12 month with Dr Graciela Husbands.  You will receive a reminder letter in the mail two months in advance. If you don't receive a letter, please call our office to schedule the follow-up appointment.

## 2010-02-09 NOTE — Assessment & Plan Note (Signed)
Summary: icd close to eri/mt  Medications Added BENZONATATE 200 MG CAPS (BENZONATATE) three times a day CEFDINIR 300 MG CAPS (CEFDINIR) two times a day NITROSTAT 0.4 MG SUBL (NITROGLYCERIN) 1 tablet under tongue at onset of chest pain; you may repeat every 5 minutes for up to 3 doses.      Allergies Added:   Visit Type:  ov Referring Nyisha Clippard:  n/a Primary Ralyn Stlaurent:  Leslee Home, MD    CC:  swelling.  History of Present Illness: [name] is seen in followup for management of CAD and CHF. She has had multiple PCI procedures. She has an ischemic cardiomyopathy with an ejection fraction of 25-30% and improved to 45% after CRT.-D SThe  last echocardiogram was in May of 2008 at which time her ejection fraction was 45% and she then underwent Myoview scan he last fall demonstrated normalization of left ventricular function.   She's had some fatigue recently. There is ventricular stress in her life over the last year. she wonders to what degree this is contributing. She has mild chronic peripheral edema but no significant change in her functional status   Current Medications (verified): 1)  Metamucil 48.57 % Powd (Psyllium) .... Daily 2)  Aspirin 81 Mg Tbec (Aspirin) .... Take One Tablet By Mouth Daily 3)  Prevacid 30 Mg Cpdr (Lansoprazole) .... One Tablet By Mouth Once Daily 4)  Furosemide 40 Mg Tabs (Furosemide) .... 1/2 Tab By Mouth Once Daily 5)  Toprol Xl 50 Mg Xr24h-Tab (Metoprolol Succinate) .[name].. 1 Tab Two Times A Day 6)  Ramipril 5 Mg Caps (Ramipril) .... Take One Capsule By Mouth Daily 7)  Isosorbide Mononitrate Cr 30 Mg Xr24h-Tab (Isosorbide Mononitrate) .... Take One Tablet By Mouth Daily 8)  Levothroid 125 Mcg Tabs (Levothyroxine Sodium) .[name].. 1 Tab Once Daily 9)  Tranxene-T 3.75 Mg Tabs (Clorazepate Dipotassium) .[name].. 1 Tab Prn 10)  Pravastatin Sodium 20 Mg Tabs (Pravastatin Sodium) .... Take One Tablet By Mouth Daily At Bedtime 11)  Benzonatate 200 Mg Caps (Benzonatate)  .... Three Times A Day 12)  Cefdinir 300 Mg Caps (Cefdinir) .... Two Times A Day 13)  Nitrostat 0.4 Mg Subl (Nitroglycerin) .[name].. 1 Tablet Under Tongue At Onset of Chest Pain; You May Repeat Every 5 Minutes For Up To 3 Doses.  Allergies (verified): 1)  ! Sulfa  Past History:  Past Medical History: Last updated: 04/09/2009 1. Recent hospitalization for chest pain 12/2007 felt to be noncardiac. 2. Coronary artery disease status post prior anterior wall myocardial     infarction and multiple percutaneous coronary intervention. 3. Ischemic cardiomyopathy, ejection fraction 25-30% improved to 45%     with biventricular pacing. 4. Systolic heart failure improved to class I to II. 5. Status post implantable cardioverter-defibrillator biventricular     pacer (Dec 2005).-- St. Jude Atlas 6. Hypertension. 7. Hyperlipidemia. 8. Gastroesophageal reflux disease.  9. Esophageal Stricture, last dilated 2007 10. Hiatal Hernia 11. History of severe diverticular disease, left colon 2004. 12. Urinary Incontenance 13. Recurrent UTIs (Klebsiella pneumoniae)     Last Cx 02-03-09 (R- ancef, Nitrofurantoin, Augmentin, Zosyn)  Past Surgical History: Last updated: 04/09/2009 Angioplasty/Stent Hysterectomy Tubal Ligation Appendectomy St. Jude Atlas-implantation-2005  Family History: Last updated: 09/15/2008 No FH of Colon Cancer: Family History of Colon Polyps: Sister Family History of Heart Disease: Mother, Father, Brothers, Sisters Family History of Prostate Cancer: Brother Family History of Diabetes: Aunt, Daughter  Social History: Last updated: 09/15/2008 SOCIAL HISTORY:  [name] recently lost her husband in 2009 also lost  the son to brain tumor.  She has a son who lives in [place], [place] who was in an automobile accident for DWI.  He has had a long history of alcohol problem.  She has helped him out financially in the past, but is ready to be much stricter. She has a daughter who lives  in [place] with breast Ca. Patient has never smoked.  Alcohol Use - no  Vital Signs:  Patient profile:   77 year old female Height:      62 inches Weight:      137 pounds BMI:     25.15 Pulse rate:   64 / minute BP sitting:   98 / 60  (left arm) Cuff size:   regular  Vitals Entered By: Caralee Ates CMA (October 27, 2009 2:02 PM)  Physical Exam  General:  well-developed, well-nourished, and well-hydrated.   Head:  normal HEENT Neck:  supple with flat neck veins Lungs:  normal respiratory effort and normal breath sounds.   Heart:  normal rate, regular rhythm, and no murmur.   Abdomen:  soft, non-tender, and normal bowel sounds.   no flank pain.  Msk:  No major deformities. She does walk with a cane because of back issue Extremities:  left pretibial edema and right pretibial edema.   Neurologic:  Alert and  oriented x4;  grossly normal neurologically. Skin:  Intact without significant lesions or rashes.    ICD Specifications Following MD:  Sherryl Manges, MD     ICD Vendor:  [facility] Jude     ICD Model Number:  [phone]     ICD Serial Number:  [id number] ICD DOI:  12/14/2003     ICD Implanting MD:  Sherryl Manges, MD  Lead 1:    Location: RA     DOI: 12/14/2003     Model #: 1488TC     Serial #: [id number]     Status: active Lead 2:    Location: RV     DOI: 12/14/2003     Model #: 1581     Serial #: [id number]     Status: active Lead 3:    Location: LV     DOI: 12/14/2003     Model #: 1056T     Serial #: [id number]     Status: active  Indications::  ICM, CHF   ICD Follow Up Battery Voltage:  2.43 V     Charge Time:  14.5 seconds     Battery Est. Longevity:  [facility]I Underlying rhythm:  SR ICD Dependent:  No       ICD Device Measurements Atrium:  Amplitude: 3.0 mV, Impedance: 395 ohms, Threshold: 0.75 V at 0.5 msec Right Ventricle:  Amplitude: 4.7 mV, Impedance: 345 ohms, Threshold: 1.25 V at 0.5 msec Left Ventricle:  Impedance: 550 ohms, Threshold: 1.0 V at 0.8 msec Configuration: BIPOLAR Shock  Impedance: 36 ohms   Episodes MS Episodes:  2     Percent Mode Switch:  <1%     Coumadin:  No Shock:  0     ATP:  0     Nonsustained:  0     Atrial Therapies:  0 Atrial Pacing:  3%     Ventricular Pacing:  92%  Brady Parameters Mode DDD     Lower Rate Limit:  50     Upper Rate Limit 120 PAV 170     Sensed AV Delay:  120  Tachy Zones VF:  222     VT:  200  VT1:  164     Tech Comments:  DEVICE AT [facility].  NORMAL DEVICE FUNCTION.  NO CHANGES MADE. PT TO BE SCHEDULED FOR GEN CHANGE. Vella Kohler  October 27, 2009 2:32 PM  Impression & Recommendations:  Problem # 1:  CARDIOMYOPATHY, ISCHEMIC (ICD-414.8) there has been intercurrent normalization of her left ventricular systolic function. As a result, it is not clear that there is an indication for defibrillator generator replacement. This is further supported by recent data demonstrating a lack of benefit in octogenarians. As a consequence, after a lengthy discussion with her and her granddaughter, we elected to proceed with CRT-D removal and CRT P re implantation.  we have discussed potential benefits and risks including but not limited to infection S he understands risks and are willing to proceed Her updated medication list for this problem includes:    Aspirin 81 Mg Tbec (Aspirin) .[name]... Take one tablet by mouth daily    Furosemide 40 Mg Tabs (Furosemide) .[name]... 1/2 tab by mouth once daily    Toprol Xl 50 Mg Xr24h-tab (Metoprolol succinate) .[name]... 1 tab two times a day    Ramipril 5 Mg Caps (Ramipril) .[name]... Take one capsule by mouth daily    Isosorbide Mononitrate Cr 30 Mg Xr24h-tab (Isosorbide mononitrate) .[name]... Take one tablet by mouth daily    Nitrostat 0.4 Mg Subl (Nitroglycerin) .[name]... 1 tablet under tongue at onset of chest pain; you may repeat every 5 minutes for up to 3 doses.  Problem # 2:  DIASTOLIC HEART FAILURE, CHRONIC (ICD-428.32) stable on current medications Her updated medication list for this problem includes:    Aspirin  81 Mg Tbec (Aspirin) .[name]... Take one tablet by mouth daily    Furosemide 40 Mg Tabs (Furosemide) .[name]... 1/2 tab by mouth once daily    Toprol Xl 50 Mg Xr24h-tab (Metoprolol succinate) .[name]... 1 tab two times a day    Ramipril 5 Mg Caps (Ramipril) .[name]... Take one capsule by mouth daily    Isosorbide Mononitrate Cr 30 Mg Xr24h-tab (Isosorbide mononitrate) .[name]... Take one tablet by mouth daily    Nitrostat 0.4 Mg Subl (Nitroglycerin) .[name]... 1 tablet under tongue at onset of chest pain; you may repeat every 5 minutes for up to 3 doses.  Problem # 3:  IMPLANTABLE  DEFIBRILLATOR  CRT ST J (ICD-V45.02) Device parameters and data were reviewed and no changes were made

## 2010-02-09 NOTE — Miscellaneous (Signed)
Summary: med update  Clinical Lists Changes  Medications: Removed medication of BENAZEPRIL HCL 20 MG TABS (BENAZEPRIL HCL) 1 tab once daily

## 2010-02-09 NOTE — Assessment & Plan Note (Signed)
Summary: UTI?/PER TAMIKA/VS   Referring Provider:  n/a Primary Provider:  Leslee Home, MD    CC:  UTI? [name]  History of Present Illness: 77 yo F with hx of recurrent Klebsiella pneumonia UTIs, stress urinary incontenance, and cystitis glandularis on recent cystoscopy as well as recurrent UTIs. She was seen for an exacerbation in ED on Feb 03, 2009- her UCx grew > 100k K pneumo (R- Augmentin, Zosyn,  Septra and Nitrofurantoin). She was given Septra.  She was previously taking Nitrofurantoin chronically for suppression. States she is "stillhaving trouble". Saw family MD and was given 3 different strengths of medication. Has also been seen by urology. Had episode of shaking and chills and thought she was having a stroke. Was treated with IV antibiotics. Had UCX May 2011   COLONY COUNT:                 >=100,000 COLONIES/ML  CULTURE:                      KLEBSIELLA PNEUMONIAE  METHOD:                       MIC  AMPICILLIN:                   16                                RESISTANT  CEFAZOLIN:                    <=4                                SENSITIVE  CEFTRIAXONE:                  <=1                                SENSITIVE  CIPROFLOXACIN:                <=0.25                                SENSITIVE  GENTAMICIN:                   <=1                                SENSITIVE  LEVOFLOXACIN:                 <=0.12                                SENSITIVE  NITROFURANTOIN:               <=16                                SENSITIVE  TOBRAMYCIN:                   <=1  SENSITIVE  TRIMETH/SULFA:                40                                SENSITIVE She continues to have discomfort but states that this is improved from previous. No recent fever or chills.  No blood or cloudiness in her urine.   Preventive Screening-Counseling & Management  Alcohol-Tobacco     Alcohol drinks/day: 0     Smoking Status: never  Caffeine-Diet-Exercise     Caffeine  use/day: tea occassionally     Does Patient Exercise: yes     Type of exercise: exercises at home     Exercise (avg: min/session): 30-60     Times/week: 4  Safety-Violence-Falls     Seat Belt Use: yes  Current Medications (verified): 1)  Metamucil 48.57 % Powd (Psyllium) .... Daily 2)  Aspirin 81 Mg Tbec (Aspirin) .... Take One Tablet By Mouth Daily 3)  Prevacid 30 Mg Cpdr (Lansoprazole) .... One Tablet By Mouth Once Daily 4)  Furosemide 40 Mg Tabs (Furosemide) .... 1/2 Tab By Mouth Once Daily 5)  Toprol Xl 50 Mg Xr24h-Tab (Metoprolol Succinate) .[name].. 1 Tab Two Times A Day 6)  Ramipril 5 Mg Caps (Ramipril) .... Take One Capsule By Mouth Daily 7)  Isosorbide Mononitrate Cr 30 Mg Xr24h-Tab (Isosorbide Mononitrate) .... Take One Tablet By Mouth Daily 8)  Hydroxyzine Hcl 25 Mg Tabs (Hydroxyzine Hcl) .[name].. 1 Tablet As Needed 9)  Levothroid 125 Mcg Tabs (Levothyroxine Sodium) .[name].. 1 Tab Once Daily 10)  Tranxene-T 3.75 Mg Tabs (Clorazepate Dipotassium) .[name].. 1 Tab Prn 11)  Pravastatin Sodium 20 Mg Tabs (Pravastatin Sodium) .... Take One Tablet By Mouth Daily At Bedtime 12)  Estrace 0.1 Mg/gm Crea (Estradiol) .... Apply Twice Weekly Per Pcp 13)  Cimetidine 400 Mg Tabs (Cimetidine) .... Take One Two Times A Day  Allergies (verified): 1)  ! Sulfa    Updated Prior Medication List: METAMUCIL 48.57 % POWD (PSYLLIUM) daily ASPIRIN 81 MG TBEC (ASPIRIN) Take one tablet by mouth daily PREVACID 30 MG CPDR (LANSOPRAZOLE) one tablet by mouth once daily FUROSEMIDE 40 MG TABS (FUROSEMIDE) 1/2 tab by mouth once daily TOPROL XL 50 MG XR24H-TAB (METOPROLOL SUCCINATE) 1 tab two times a day RAMIPRIL 5 MG CAPS (RAMIPRIL) Take one capsule by mouth daily ISOSORBIDE MONONITRATE CR 30 MG XR24H-TAB (ISOSORBIDE MONONITRATE) Take one tablet by mouth daily HYDROXYZINE HCL 25 MG TABS (HYDROXYZINE HCL) 1 tablet as needed LEVOTHROID 125 MCG TABS (LEVOTHYROXINE SODIUM) 1 tab once daily TRANXENE-T 3.75 MG TABS  (CLORAZEPATE DIPOTASSIUM) 1 tab prn PRAVASTATIN SODIUM 20 MG TABS (PRAVASTATIN SODIUM) Take one tablet by mouth daily at bedtime ESTRACE 0.1 MG/GM CREA (ESTRADIOL) Apply twice weekly per PCP CIMETIDINE 400 MG TABS (CIMETIDINE) take one two times a day  Current Allergies (reviewed today): ! SULFA Review of Systems       has some mild supra-pubic "touchy" feeling. no pain.   Vital Signs:  Patient profile:   77 year old female Height:      62 inches (157.48 cm) Weight:      139 pounds (63.18 kg) BMI:     25.52 Temp:     97.7 degrees F (36.50 degrees C) Pulse rate:   75 / minute BP sitting:   119 / 65  (left arm)  Vitals Entered By: Baxter Hire) (September 19, 2009 10:10 AM) CC:  UTI?  Pain Assessment Patient in pain? yes     Location: lower abdomen Intensity: 2 Type: discomfort Onset of pain  Constant Nutritional Status BMI of 25 - 29 = overweight Nutritional Status Detail appetite is pretty good per patient  Does patient need assistance? Functional Status Self care Ambulation Normal   Physical Exam  General:  well-developed, well-nourished, and well-hydrated.   Lungs:  normal respiratory effort and normal breath sounds.   Heart:  normal rate, regular rhythm, and no murmur.   Abdomen:  soft, non-tender, and normal bowel sounds.   no flank pain.  Extremities:  left pretibial edema and right pretibial edema.     Impression & Recommendations:  Problem # 1:  CYSTITIS, CHRONIC (ICD-595.2)  she i s aware of bladder exercising and trying to completely evacuate her bladder at each, wiping front to back, taking cranberry pills/juice, drinking extra fluid. Will not treat her as she is not  symptomatic ( fevers, chills, flank pain, urinary d/c, hematuria). have asked that she call when she has these signs and will start her on therapy and recheck her UCx then.   Orders: Est. Patient Level III ([id number])

## 2010-02-09 NOTE — Procedures (Signed)
Summary: [facility] CHECK/MT  Medications Added METAMUCIL 48.57 % POWD (PSYLLIUM) two times a day      Allergies Added:   Current Medications (verified): 1)  Metamucil 48.57 % Powd (Psyllium) .... Two Times A Day 2)  Aspirin 81 Mg Tbec (Aspirin) .... Take One Tablet By Mouth Daily 3)  Prevacid 30 Mg Cpdr (Lansoprazole) .... One Tablet By Mouth Once Daily 4)  Furosemide 40 Mg Tabs (Furosemide) .... 1/2 Tab By Mouth Once Daily 5)  Toprol Xl 50 Mg Xr24h-Tab (Metoprolol Succinate) .[name].. 1 Tab Two Times A Day 6)  Ramipril 5 Mg Caps (Ramipril) .... Take One Capsule By Mouth Daily 7)  Isosorbide Mononitrate Cr 30 Mg Xr24h-Tab (Isosorbide Mononitrate) .... Take One Tablet By Mouth Daily 8)  Levothroid 125 Mcg Tabs (Levothyroxine Sodium) .[name].. 1 Tab Once Daily 9)  Tranxene-T 3.75 Mg Tabs (Clorazepate Dipotassium) .[name].. 1 Tab Prn 10)  Pravastatin Sodium 20 Mg Tabs (Pravastatin Sodium) .... Take One Tablet By Mouth Daily At Bedtime 11)  Nitrostat 0.4 Mg Subl (Nitroglycerin) .[name].. 1 Tablet Under Tongue At Onset of Chest Pain; You May Repeat Every 5 Minutes For Up To 3 Doses. 12)  Fexofenadine Hcl 180 Mg Tabs (Fexofenadine Hcl) .... Once Daily 13)  Cimetidine 400 Mg Tabs (Cimetidine) .... Once Daily  Allergies (verified): 1)  ! Sulfa   ICD Specifications Following MD:  Sherryl Manges, MD     ICD Vendor:  [facility] Jude     ICD Model Number:  [id number]     ICD Serial Number:  [id number] ICD DOI:  11/16/2009     ICD Implanting MD:  Sherryl Manges, MD  Lead 1:    Location: RA     DOI: 12/14/2003     Model #: 1488TC     Serial #: [id number]     Status: active Lead 2:    Location: RV     DOI: 12/14/2003     Model #: 1581     Serial #: [id number]     Status: active Lead 3:    Location: LV     DOI: 12/14/2003     Model #: 1056T     Serial #: [id number]     Status: active  Indications::  ICM, CHF  Explantation Comments: 11/16/09 [employer] [id number] explanted  ICD Follow Up Remote Check?  No Charge Time:  8.0 seconds      Battery Est. Longevity:  6.2 years Underlying rhythm:  SR ICD Dependent:  No       ICD Device Measurements Atrium:  Amplitude: 2.8 mV, Impedance: 390 ohms, Threshold: 0.75 V at 0.5 msec Right Ventricle:  Amplitude: 9.2 mV, Impedance: 360 ohms, Threshold: 1.25 V at 0.5 msec Left Ventricle:  Impedance: 580 ohms, Threshold: 1.25 V at 0.5 msec Configuration: BIPOLAR Shock Impedance: 39 ohms   Episodes MS Episodes:  1     Percent Mode Switch:  <1%     Coumadin:  No Shock:  0     ATP:  0     Nonsustained:  0     Atrial Pacing:  <1%     Ventricular Pacing:  97%  Brady Parameters Mode DDD     Lower Rate Limit:  50     Upper Rate Limit 120 PAV 170     Sensed AV Delay:  120  Tachy Zones VF:  222     VT:  200     VT1:  164     Next Cardiology Appt Due:  02/15/2010 Tech Comments:  Steri strips removed, no redness or edema.  RV autocapture on.  Device function normal.  ROV 02/15/10 with Dr. Graciela Husbands.  Altha Harm, LPN  November 30, 2009 10:09 AM

## 2010-02-09 NOTE — Procedures (Signed)
Summary: Cardiology Device Clinic    Allergies: 1)  ! Sulfa   ICD Specifications Following MD:  Sherryl Manges, MD     ICD Vendor:  [facility] Jude     ICD Model Number:  [phone]     ICD Serial Number:  [id number] ICD DOI:  12/14/2003     ICD Implanting MD:  Sherryl Manges, MD  Lead 1:    Location: RA     DOI: 12/14/2003     Model #: 1488TC     Serial #: [id number]     Status: active Lead 2:    Location: RV     DOI: 12/14/2003     Model #: 1581     Serial #: [id number]     Status: active Lead 3:    Location: LV     DOI: 12/14/2003     Model #: 1056T     Serial #: [id number]     Status: active  Indications::  ICM, CHF   ICD Follow Up Battery Voltage:  2.53 V     Charge Time:  13.3 seconds     Underlying rhythm:  SR ICD Dependent:  No       ICD Device Measurements Atrium:  Amplitude: 3.0 mV, Impedance: 410 ohms, Threshold: 0.5 V at 0.5 msec Right Ventricle:  Amplitude: 5.5 mV, Impedance: 345 ohms, Threshold: 1.25 V at 0.5 msec Left Ventricle:  Impedance: 560 ohms, Threshold: 1.0 V at 0.8 msec Configuration: BIPOLAR Shock Impedance: 36 ohms   Episodes MS Episodes:  1     Percent Mode Switch:  <1%     Coumadin:  No Shock:  0     ATP:  0     Nonsustained:  0     Atrial Therapies:  0 Atrial Pacing:  2%     Ventricular Pacing:  91%  Brady Parameters Mode DDD     Lower Rate Limit:  50     Upper Rate Limit 120 PAV 170     Sensed AV Delay:  120  Tachy Zones VF:  222     VT:  200     VT1:  164     Next Remote Date:  08/11/2009     Tech Comments:  1 AMS EPISODE LASTING LESS THAN 1 MINUTE.  2 PMT EPISODES-PVARP SET AT .  BATTERY VOLTAGE 2.53 V --CHECK EVERY 8 WKS.  PT AWARE OF BATTERY AND 8 WK CHECKS. CHANGED RV AMPLITUDE FROM 2.0 TO 2.5 V. MERLIN CHECK 08-11-09. Vella Kohler  June 16, 2009 11:23 AM

## 2010-02-09 NOTE — Progress Notes (Signed)
Summary: TALK TO DR (discuss w/ BB)   Phone Note From Other Clinic Call back at [phone] OR [phone]   Caller: Provider DR Lorenz Coaster Summary of Call: PER DR KELLER PLEASE CALL TO DISCUSS PT [phone] or [phone]. HE IS AWARE THAT DR Juanda Chance IS OUT TIL 26TH Initial call taken by: Edman Circle,  January 26, 2009 10:58 AM  Follow-up for Phone Call        I verified with Asher Muir in scheduling that Dr. Lorenz Coaster is ok to speak with Dr. Juanda Chance on 02/02/09. Sherri Rad, RN, BSN  January 26, 2009 11:54 AM

## 2010-02-09 NOTE — Progress Notes (Signed)
Summary: Question about doing exercises   Phone Note Call from Patient Call back at Home Phone [phone]   Caller: Patient Summary of Call: Pt calling with question about doing exercise at the [facility] Initial call taken by: Judie Grieve,  September 16, 2009 1:20 PM  Follow-up for Phone Call        I spoke with the pt. She wanted to know if she could do water aerobics. I advised she could do this. Follow-up by: Sherri Rad, RN, BSN,  September 16, 2009 4:55 PM

## 2010-02-09 NOTE — Progress Notes (Signed)
Summary: returning call  Medications Added PRAVASTATIN SODIUM 20 MG TABS (PRAVASTATIN SODIUM) Take one tablet by mouth daily at bedtime       Phone Note Call from Patient Call back at Home Phone [phone]   Caller: Patient Reason for Call: Talk to Nurse Summary of Call: returning call Initial call taken by: Migdalia Dk,  January 28, 2009 4:26 PM  Follow-up for Phone Call        I spoke with pt. Pt aware of results.    New/Updated Medications: PRAVASTATIN SODIUM 20 MG TABS (PRAVASTATIN SODIUM) Take one tablet by mouth daily at bedtime Prescriptions: PRAVASTATIN SODIUM 20 MG TABS (PRAVASTATIN SODIUM) Take one tablet by mouth daily at bedtime  #30 x 6   Entered by:   Sherri Rad, RN, BSN   Authorized by:   Lenoria Farrier, MD, [facility]   Signed by:   Sherri Rad, RN, BSN on 01/28/2009   Method used:   Electronically to        [facility] (retail)       [address]       [place], [place]  [address]       Ph: [phone] or [phone]       Fax: [phone]   RxID:   [id number]

## 2010-02-09 NOTE — Progress Notes (Signed)
Summary: Need device rep for procedure   Phone Note From Other Clinic Call back at [phone] ext- [phone]   Caller: Debbie with [facility] Summary of Call: Per Eunice Blase, the pt is [facility].They are requesting that a rep come to turn off her ICD for the procedure. She is scheduled for 10:30am procedure- rep would need to be there around 10:00am @ [address]. I will discuss with a rep for the pt's device and call Debbie back. Sherri Rad, RN, BSN  February 21, 2009 2:25 PM  I spoke with Joice Lofts- device RN regarding the above call. Per [facility]- she just gives the surgeons office the phone number for the device rep and makes them responsible for contacting the rep. I called and left Debbie a message stating this and the # for [employer] with [employer]- 1-800-PACEICD.  Initial call taken by: Sherri Rad, RN, BSN,  February 21, 2009 2:33 PM

## 2010-02-09 NOTE — Progress Notes (Signed)
Summary: Pt having chills  Phone Note Call from Patient   Caller: Daughter Summary of Call: Mother is shaking.  She was told by Dr Ninetta Lights to call if these symptoms return.  She was seen by Dr Ninetta Lights for recurrent UTI's. They are requesting an appointment for today.   We do not have a provider here this late in the day. , Pt was advised to call their primary care office for immediate help today.  Tomasita Morrow RN  May 13, 2009 3:40 PM

## 2010-02-09 NOTE — Progress Notes (Signed)
   Phone Note Other Incoming   Caller: Debbie Details for Reason: Pt.Information Initial call taken by: Marijean Niemann LOV,Stress over to [facility]  January 25, 2009 1:02 PM

## 2010-02-09 NOTE — Progress Notes (Signed)
   Phone Note Outgoing Call   Call placed by: rhonda Call placed to: Patient Summary of Call: adv patient not to take lasix am of procedure on 11/9

## 2010-02-09 NOTE — Assessment & Plan Note (Signed)
Summary: f21m  Medications Added FEXOFENADINE HCL 180 MG TABS (FEXOFENADINE HCL) once daily CIMETIDINE 400 MG TABS (CIMETIDINE) once daily      Allergies Added:   Visit Type:  Follow-up Referring Provider:  n/a Primary Provider:  Leslee Home, MD    CC:  some discomfort in chest (maybe acid).  History of Present Illness: [name] is 77 years old and return for management of CAD and CHF. She has had multiple PCI procedures and has had a prior anterior infarction. She had ischemic cardiopathy with an ejection fraction of 25-30% which improved to 45% after biventricular pacing. She has systolic CHF. She has had no recent chest pain shortness of breath or palpitations.  Her ICD/biventricular pacemaker recently reached ERI. She and Dr. Alberteen Spindle talked about the possibility of not replacing the ICD and just replacing the biventricular pacer. She has thought about this a great deal and now feels like she would like to have the ICD left on.  She is hoping to take a trip to [place] about 3 weeks after her ICD change.  Current Medications (verified): 1)  Metamucil 48.57 % Powd (Psyllium) .... Daily 2)  Aspirin 81 Mg Tbec (Aspirin) .... Take One Tablet By Mouth Daily 3)  Prevacid 30 Mg Cpdr (Lansoprazole) .... One Tablet By Mouth Once Daily 4)  Furosemide 40 Mg Tabs (Furosemide) .... 1/2 Tab By Mouth Once Daily 5)  Toprol Xl 50 Mg Xr24h-Tab (Metoprolol Succinate) .[name].. 1 Tab Two Times A Day 6)  Ramipril 5 Mg Caps (Ramipril) .... Take One Capsule By Mouth Daily 7)  Isosorbide Mononitrate Cr 30 Mg Xr24h-Tab (Isosorbide Mononitrate) .... Take One Tablet By Mouth Daily 8)  Levothroid 125 Mcg Tabs (Levothyroxine Sodium) .[name].. 1 Tab Once Daily 9)  Tranxene-T 3.75 Mg Tabs (Clorazepate Dipotassium) .[name].. 1 Tab Prn 10)  Pravastatin Sodium 20 Mg Tabs (Pravastatin Sodium) .... Take One Tablet By Mouth Daily At Bedtime 11)  Nitrostat 0.4 Mg Subl (Nitroglycerin) .[name].. 1 Tablet Under Tongue At Onset of Chest  Pain; You May Repeat Every 5 Minutes For Up To 3 Doses. 12)  Fexofenadine Hcl 180 Mg Tabs (Fexofenadine Hcl) .... Once Daily 13)  Cimetidine 400 Mg Tabs (Cimetidine) .... Once Daily  Allergies (verified): 1)  ! Sulfa  Past History:  Past Medical History: Reviewed history from 04/09/2009 and no changes required. 1. Recent hospitalization for chest pain 12/2007 felt to be noncardiac. 2. Coronary artery disease status post prior anterior wall myocardial     infarction and multiple percutaneous coronary intervention. 3. Ischemic cardiomyopathy, ejection fraction 25-30% improved to 45%     with biventricular pacing. 4. Systolic heart failure improved to class I to II. 5. Status post implantable cardioverter-defibrillator biventricular     pacer (Dec 2005).-- [employer] 6. Hypertension. 7. Hyperlipidemia. 8. Gastroesophageal reflux disease.  9. Esophageal Stricture, last dilated 2007 10. Hiatal Hernia 11. History of severe diverticular disease, left colon 2004. 12. Urinary Incontenance 13. Recurrent UTIs (Klebsiella pneumoniae)     Last Cx 02-03-09 (R- ancef, Nitrofurantoin, Augmentin, Zosyn)  Review of Systems       ROS is negative except as outlined in HPI.   Vital Signs:  Patient profile:   77 year old female Height:      62 inches Weight:      140 pounds BMI:     25.70 Pulse rate:   77 / minute BP sitting:   154 / 68  (left arm) Cuff size:   regular  Vitals Entered  By: Hardin Negus, RMA (November 11, 2009 10:01 AM)  Physical Exam  Additional Exam:  Gen. Well-nourished, in no distress   Neck: No JVD, thyroid not enlarged, no carotid bruits Lungs: No tachypnea, clear without rales, rhonchi or wheezes Cardiovascular: Rhythm regular, PMI not displaced,  heart sounds  normal, no murmurs or gallops, no peripheral edema, pulses normal in all 4 extremities. Abdomen: BS normal, abdomen soft and non-tender without masses or organomegaly, no hepatosplenomegaly. MS: No  deformities, no cyanosis or clubbing   Neuro:  No focal sns   Skin:  no lesions     ICD Specifications Following MD:  Sherryl Manges, MD     ICD Vendor:  [employer]     ICD Model Number:  [id number]     ICD Serial Number:  [id number] ICD DOI:  12/14/2003     ICD Implanting MD:  Sherryl Manges, MD  Lead 1:    Location: RA     DOI: 12/14/2003     Model #: 1488TC     Serial #: [id number]     Status: active Lead 2:    Location: RV     DOI: 12/14/2003     Model #: 1581     Serial #: [id number]     Status: active Lead 3:    Location: LV     DOI: 12/14/2003     Model #: 1056T     Serial #: [id number]     Status: active  Indications::  ICM, CHF   ICD Follow Up ICD Dependent:  No       ICD Device Measurements Configuration: BIPOLAR  Episodes Coumadin:  No  Brady Parameters Mode DDD     Lower Rate Limit:  50     Upper Rate Limit 120 PAV 170     Sensed AV Delay:  120  Tachy Zones VF:  222     VT:  200     VT1:  164     Impression & Recommendations:  Problem # 1:  IMPLANTABLE  DEFIBRILLATOR  CRT ST J (ICD-V45.02) She has an ICD/biventricular pacemaker that has reached ERI. Dr. Graciela Husbands had originally talked to her about not replacing the ICD and just replacing the biventricular pacemaker. She has thought about this a great deal and has decided she would like to have the ICD left in place and active. She is 77 years old but she is a very young 77 years old and travels and is very active  Problem # 2:  SYSTOLIC HEART FAILURE, CHRONIC (ICD-428.22) She has a history of systolic CHF. She usually me today and this problem is stable. Her updated medication list for this problem includes:    Aspirin 81 Mg Tbec (Aspirin) .[name]... Take one tablet by mouth daily    Furosemide 40 Mg Tabs (Furosemide) .[name]... 1/2 tab by mouth once daily    Toprol Xl 50 Mg Xr24h-tab (Metoprolol succinate) .[name]... 1 tab two times a day    Ramipril 5 Mg Caps (Ramipril) .[name]... Take one capsule by mouth daily    Isosorbide Mononitrate Cr 30 Mg Xr24h-tab  (Isosorbide mononitrate) .[name]... Take one tablet by mouth daily    Nitrostat 0.4 Mg Subl (Nitroglycerin) .[name]... 1 tablet under tongue at onset of chest pain; you may repeat every 5 minutes for up to 3 doses.  Problem # 3:  CORONARY ARTERY DISEASE (ICD-414.00) She has had multiple PCI procedures. She has had no chest pain this problem appears stable. Her updated medication list  for this problem includes:    Aspirin 81 Mg Tbec (Aspirin) .[name]... Take one tablet by mouth daily    Toprol Xl 50 Mg Xr24h-tab (Metoprolol succinate) .[name]... 1 tab two times a day    Ramipril 5 Mg Caps (Ramipril) .[name]... Take one capsule by mouth daily    Isosorbide Mononitrate Cr 30 Mg Xr24h-tab (Isosorbide mononitrate) .[name]... Take one tablet by mouth daily    Nitrostat 0.4 Mg Subl (Nitroglycerin) .[name]... 1 tablet under tongue at onset of chest pain; you may repeat every 5 minutes for up to 3 doses.  Orders: EKG w/ Interpretation (93000)  Problem # 4:  HYPERTENSION, BENIGN (ICD-401.1) This is controlled on current medications. Her updated medication list for this problem includes:    Aspirin 81 Mg Tbec (Aspirin) .[name]... Take one tablet by mouth daily    Furosemide 40 Mg Tabs (Furosemide) .[name]... 1/2 tab by mouth once daily    Toprol Xl 50 Mg Xr24h-tab (Metoprolol succinate) .[name]... 1 tab two times a day    Ramipril 5 Mg Caps (Ramipril) .[name]... Take one capsule by mouth daily  Patient Instructions: 1)  Labwork as scheduled per Dr. Graciela Husbands. 2)  Your physician recommends that you continue on your current medications as directed. Please refer to the Current Medication list given to you today.

## 2010-02-09 NOTE — Progress Notes (Signed)
Summary: question regarding procedure   Phone Note Call from Patient Call back at [facility] Phone [phone]   Caller: Other Relative- donna william- grand-dtr.  Reason for Call: Talk to Nurse Summary of Call: has question regarding procedure.  Initial call taken by: Lorne Skeens,  November 14, 2009 12:13 PM  Follow-up for Phone Call        spoke w/pt and adv her to go to Short stay at [facility] and to take all meds but Furosemide. Pt stated understanding.  Follow-up by: Claris Gladden RN,  November 14, 2009 12:37 PM

## 2010-02-09 NOTE — Cardiovascular Report (Signed)
Summary: Office Visit Remote   Office Visit Remote   Imported By: Roderic Ovens 11/17/2009 15:48:56  _____________________________________________________________________  External Attachment:    Type:   Image     Comment:   External Document

## 2010-02-09 NOTE — Assessment & Plan Note (Signed)
Summary: new pt recurrent uti   Referring Provider:  n/a Primary Provider:  Leslee Home, MD    CC:  new patient recurrent uti.  History of Present Illness: 77 yo F with hx of recurrent Klebsiella pneumonia UTIs, stress urinary incontenance, and cystitis glandularis on recent cystoscopy as well as recurrent UTIs. She was seen for an exacerbation in ED on Jan 27, her UCx grew > 100k K pneumo (R- Augmentin, Zosyn,  Septra and Nitrofurantoin). She was given Septra.  She takes Nitrofurantoin chronically for suppression. At her Urology f/u she was asx and was not started on f/u meds.   Today states she has had bacterial infection in her kidneys or bladder for several months. Her with family member who states that this has been ongoing since July 2010 after travel. Had chills then but none since. No fevers. Has lost wt during this period, food did not taste right. No dysuria. Feels uncomfortable after she finnishes. No back or flank pain. No dysuria or hematuria.    Preventive Screening-Counseling & Management  Alcohol-Tobacco     Alcohol drinks/day: 0     Smoking Status: never  Caffeine-Diet-Exercise     Caffeine use/day: tea occassionally     Does Patient Exercise: yes     Type of exercise: exercises at home     Exercise (avg: min/session): 30-60     Times/week: 4  Safety-Violence-Falls     Seat Belt Use: yes   Updated Prior Medication List: METAMUCIL 48.57 % POWD (PSYLLIUM) daily ASPIRIN 81 MG TBEC (ASPIRIN) Take one tablet by mouth daily PREVACID 30 MG CPDR (LANSOPRAZOLE) one tablet by mouth once daily FUROSEMIDE 40 MG TABS (FUROSEMIDE) 1/2 tab by mouth once daily TOPROL XL 50 MG XR24H-TAB (METOPROLOL SUCCINATE) 1 tab two times a day RAMIPRIL 5 MG CAPS (RAMIPRIL) Take one capsule by mouth daily ISOSORBIDE MONONITRATE CR 30 MG XR24H-TAB (ISOSORBIDE MONONITRATE) Take one tablet by mouth daily HYDROXYZINE HCL 25 MG TABS (HYDROXYZINE HCL) 1 tab three times a day as needed LEVOTHROID  125 MCG TABS (LEVOTHYROXINE SODIUM) 1 tab once daily MACRODANTIN 50 MG CAPS (NITROFURANTOIN MACROCRYSTAL) 1 tab qhs TRANXENE-T 3.75 MG TABS (CLORAZEPATE DIPOTASSIUM) 1 tab prn TRAMADOL HCL 50 MG TABS (TRAMADOL HCL) take 1 to 2 tabs every 8 hours as needed pain PRAVASTATIN SODIUM 20 MG TABS (PRAVASTATIN SODIUM) Take one tablet by mouth daily at bedtime ESTRACE 0.1 MG/GM CREA (ESTRADIOL) Apply twice weekly per PCP  Current Allergies (reviewed today): ! SULFA Past History:  Past medical, surgical, family and social histories (including risk factors) reviewed, and no changes noted (except as noted below).  Past Medical History: 1. Recent hospitalization for chest pain 12/2007 felt to be noncardiac. 2. Coronary artery disease status post prior anterior wall myocardial     infarction and multiple percutaneous coronary intervention. 3. Ischemic cardiomyopathy, ejection fraction 25-30% improved to 45%     with biventricular pacing. 4. Systolic heart failure improved to class I to II. 5. Status post implantable cardioverter-defibrillator biventricular     pacer (Dec 2005). 6. Hypertension. 7. Hyperlipidemia. 8. Gastroesophageal reflux disease.  9. Esophageal Stricture, last dilated 2007 10. Hiatal Hernia 11. History of severe diverticular disease, left colon 2004. 12. Urinary Incontenance 13. Recurrent UTIs (Klebsiella pneumoniae)     Last Cx 02-03-09 (R- ancef, Nitrofurantoin, Augmentin, Zosyn)  Past Surgical History: Angioplasty/Stent Hysterectomy Tubal Ligation Appendectomy  Family History: Reviewed history from 09/15/2008 and no changes required. No FH of Colon Cancer: Family History of Colon Polyps: Sister Family  History of Heart Disease: Mother, Father, Brothers, Sisters Family History of Prostate Cancer: Brother Family History of Diabetes: Aunt, Daughter  Social History: Reviewed history from 09/15/2008 and no changes required. SOCIAL HISTORY:  Ms. Golembeski recently lost  her husband in 2009 also lost the son to brain tumor.  She has a son who lives in [place], [place] who was in an automobile accident for DWI.  He has had a long history of alcohol problem.  She has helped him out financially in the past, but is ready to be much stricter. She has a daughter who lives in [place] with breast Ca. Patient has never smoked.  Alcohol Use - no  Review of Systems       see HPI. worsening urinary incontenance. monitors fluid intake, urinary output.   Vital Signs:  Patient profile:   77 year old female Height:      62 inches (157.48 cm) Weight:      139.7 pounds (63.50 kg) Temp:     98.3 degrees F (36.83 degrees C) oral Pulse rate:   82 / minute BP sitting:   127 / 71  (right arm)  Vitals Entered By: Baxter Hire) (March 01, 2009 3:42 PM) CC: new patient recurrent uti Pain Assessment Patient in pain? no      Nutritional Status Detail appetite is good per patient  Does patient need assistance? Functional Status Self care Ambulation Normal   Physical Exam  General:  well-developed, well-nourished, and well-hydrated.   Eyes:  pupils equal, pupils round, and pupils reactive to light.   Mouth:  pharynx pink and moist and no exudates.   Neck:  no masses.   Chest Wall:  pacer L ant chest wall, nontender Lungs:  normal respiratory effort and normal breath sounds.   Heart:  normal rate, regular rhythm, and no murmur.   Abdomen:  soft, non-tender, and normal bowel sounds.   no flank pain.  Extremities:  no edema in BLE   Impression & Recommendations:  Problem # 1:  UTI'S, RECURRENT (ICD-599.0)  Spoke with pt and her grand-daughter at length- she has most likely become colonized due to her bladder dysfuntion. She is currently asx and I have asked her to call me if she does develop symptoms (fever, chills, dysuria, hematuria, flank pain; these are explained to her). She does have 2 good oral options remaining that I would start her on if needed-  flouroquinolones and doxycycline. i stressed to her that if she is treated repeatedly, she is at high risk to develop an organism that is resistant to all antibiotics. She agrees to defer therapy to as needed and will call for antibiotics if she feels any change and we will reculture her and start her on anbx.   The following medications were removed from the medication list:    Macrodantin 50 Mg Caps (Nitrofurantoin macrocrystal) .[name]... 1 tab qhs  Orders: Consultation Level IV (40981)

## 2010-02-09 NOTE — Cardiovascular Report (Signed)
Summary: Office Visit   Office Visit   Imported By: Roderic Ovens 04/15/2009 13:26:40  _____________________________________________________________________  External Attachment:    Type:   Image     Comment:   External Document

## 2010-02-09 NOTE — Progress Notes (Signed)
Summary: question about med   Phone Note Call from Patient Call back at Home Phone [phone]   Caller: Patient Summary of Call: pt have question about medication Initial call taken by: Judie Grieve,  February 07, 2009 4:26 PM  Follow-up for Phone Call        I called and spoke with the pt. She states the pharmacy had a question about her receiving multiple rx's of the same class of drug. I called and spoke with her pharmacy and the question was about her ramipril. They had filled the 10mg  tabs on 1/26 and we decreased her dose to 5mg  on 1/27. I explained to the pharmacist that we did this due to low BP and that I had spoken with the pt and she is aware to only take the 5mg  (red) capsules. The pt is aware that I spoke with the pharmacy. Follow-up by: Sherri Rad, RN, BSN,  February 07, 2009 5:34 PM

## 2010-02-09 NOTE — Cardiovascular Report (Signed)
Summary: Office Visit   Office Visit   Imported By: Roderic Ovens 06/24/2009 12:46:15  _____________________________________________________________________  External Attachment:    Type:   Image     Comment:   External Document

## 2010-02-09 NOTE — Cardiovascular Report (Signed)
Summary: Office Visit   Office Visit   Imported By: Roderic Ovens 12/13/2009 16:21:39  _____________________________________________________________________  External Attachment:    Type:   Image     Comment:   External Document

## 2010-02-09 NOTE — Assessment & Plan Note (Signed)
Summary: orthostatic bp check  Nurse Visit   Vital Signs:  Patient profile:   77 year old female Pulse (ortho):   78 / minute Resp:     20 per minute BP standing:   104 / 58  Serial Vital Signs/Assessments:  Time      Position  BP       Pulse  Resp  Temp     By           Lying RA  96/46    68                    Sherri Rad, RN, BSN           Sitting   102/58   70                    Sherri Rad, RN, BSN           Standing  104/58   78                    Sherri Rad, RN, BSN  Comments: Standing - 110/58 HR- 78 Standing - 104/58 HR- 78 The pt was without any symptoms during these recordings. By: Sherri Rad, RN, BSN    Current Medications (verified): 1)  Metamucil 48.57 % Powd (Psyllium) .... Daily 2)  Aspirin 81 Mg Tbec (Aspirin) .... Take One Tablet By Mouth Daily 3)  Prevacid 30 Mg Cpdr (Lansoprazole) .... One Tablet By Mouth Once Daily 4)  Furosemide 40 Mg Tabs (Furosemide) .... Daily 5)  Toprol Xl 50 Mg Xr24h-Tab (Metoprolol Succinate) .[name].. 1 Tab Two Times A Day 6)  Altace 10 Mg Caps (Ramipril) .... One Tablet By Mouth Once Daily 7)  Isosorbide Mononitrate Cr 30 Mg Xr24h-Tab (Isosorbide Mononitrate) .... Take One Tablet By Mouth Daily 8)  Benazepril Hcl 20 Mg Tabs (Benazepril Hcl) .[name].. 1 Tab Once Daily 9)  Hydroxyzine Hcl 25 Mg Tabs (Hydroxyzine Hcl) .[name].. 1 Tab Three Times A Day As Needed 10)  Levothroid 125 Mcg Tabs (Levothyroxine Sodium) .[name].. 1 Tab Once Daily 11)  Macrodantin 50 Mg Caps (Nitrofurantoin Macrocrystal) .[name].. 1 Tab Qhs 12)  Tranxene-T 3.75 Mg Tabs (Clorazepate Dipotassium) .[name].. 1 Tab Prn 13)  Tramadol Hcl 50 Mg Tabs (Tramadol Hcl) .... Take 1 To 2 Tabs Every 8 Hours As Needed Pain 14)  Simvastatin 40 Mg Tabs (Simvastatin) .... Take One Tablet By Mouth Daily At Bedtime  Allergies (verified): No Known Drug Allergies

## 2010-02-09 NOTE — Assessment & Plan Note (Signed)
Summary: f60m  Medications Added CIMETIDINE 400 MG TABS (CIMETIDINE) take one two times a day        Visit Type:  Follow-up Referring Provider:  n/a Primary Provider:  Leslee Home, MD     History of Present Illness: The patient is 77 years old and return for management of CAD and CHF. She has had multiple PCI procedures. She has an ischemic cardiomyopathy with an ejection fraction of 25-30% and improved to 45% after CRT. She has an ICD and biventricular pacemaker which was implanted before 2007. His history of systolic CHF. Her last echocardiogram was in May of 2008 at which time her ejection fraction was 45%.  She's been doing fairly well. She has had some slight dizzy spells but she takes her blood pressure home and they have not been low. She's had no chest pain. She indicated that her ICD had about 4 months left and she is being followed on phone checks.    She was in the emergency room on May 6 at [facility] with a UTI.  Current Medications (verified): 1)  Metamucil 48.57 % Powd (Psyllium) .... Daily 2)  Aspirin 81 Mg Tbec (Aspirin) .... Take One Tablet By Mouth Daily 3)  Prevacid 30 Mg Cpdr (Lansoprazole) .... One Tablet By Mouth Once Daily 4)  Furosemide 40 Mg Tabs (Furosemide) .... 1/2 Tab By Mouth Once Daily 5)  Toprol Xl 50 Mg Xr24h-Tab (Metoprolol Succinate) .[name].. 1 Tab Two Times A Day 6)  Ramipril 5 Mg Caps (Ramipril) .... Take One Capsule By Mouth Daily 7)  Isosorbide Mononitrate Cr 30 Mg Xr24h-Tab (Isosorbide Mononitrate) .... Take One Tablet By Mouth Daily 8)  Hydroxyzine Hcl 25 Mg Tabs (Hydroxyzine Hcl) .[name].. 1 Tablet As Needed 9)  Levothroid 125 Mcg Tabs (Levothyroxine Sodium) .[name].. 1 Tab Once Daily 10)  Tranxene-T 3.75 Mg Tabs (Clorazepate Dipotassium) .[name].. 1 Tab Prn 11)  Pravastatin Sodium 20 Mg Tabs (Pravastatin Sodium) .... Take One Tablet By Mouth Daily At Bedtime 12)  Estrace 0.1 Mg/gm Crea (Estradiol) .... Apply Twice Weekly Per Pcp 13)  Cimetidine 400 Mg Tabs  (Cimetidine) .... Take One Two Times A Day  Allergies: 1)  ! Sulfa  Past History:  Past Medical History: Reviewed history from 04/09/2009 and no changes required. 1. Recent hospitalization for chest pain 12/2007 felt to be noncardiac. 2. Coronary artery disease status post prior anterior wall myocardial     infarction and multiple percutaneous coronary intervention. 3. Ischemic cardiomyopathy, ejection fraction 25-30% improved to 45%     with biventricular pacing. 4. Systolic heart failure improved to class I to II. 5. Status post implantable cardioverter-defibrillator biventricular     pacer (Dec 2005).-- [employer] 6. Hypertension. 7. Hyperlipidemia. 8. Gastroesophageal reflux disease.  9. Esophageal Stricture, last dilated 2007 10. Hiatal Hernia 11. History of severe diverticular disease, left colon 2004. 12. Urinary Incontenance 13. Recurrent UTIs (Klebsiella pneumoniae)     Last Cx 02-03-09 (R- ancef, Nitrofurantoin, Augmentin, Zosyn)  Review of Systems       ROS is negative except as outlined in HPI.   Vital Signs:  Patient profile:   77 year old female Height:      62 inches Weight:      137 pounds Pulse rate:   60 / minute Pulse rhythm:   regular BP sitting:   122 / 62  (left arm)  Vitals Entered By: Jacquelin Hawking, CMA (June 16, 2009 9:54 AM)  Physical Exam  Additional Exam:  Gen.  Well-nourished, in no distress   Neck: No JVD, thyroid not enlarged, no carotid bruits Lungs: No tachypnea, clear without rales, rhonchi or wheezes Cardiovascular: Rhythm regular, PMI not displaced,  heart sounds  normal, no murmurs or gallops, no peripheral edema, pulses normal in all 4 extremities. Abdomen: BS normal, abdomen soft and non-tender without masses or organomegaly, no hepatosplenomegaly. MS: No deformities, no cyanosis or clubbing   Neuro:  No focal sns   Skin:  no lesions     ICD Specifications Following MD:  Sherryl Manges, MD     ICD Vendor:  St Jude     ICD  Model Number:  [id number]     ICD Serial Number:  [id number] ICD DOI:  12/14/2003     ICD Implanting MD:  Sherryl Manges, MD  Lead 1:    Location: RA     DOI: 12/14/2003     Model #: 1488TC     Serial #: [id number]     Status: active Lead 2:    Location: RV     DOI: 12/14/2003     Model #: 1581     Serial #: [id number]     Status: active Lead 3:    Location: LV     DOI: 12/14/2003     Model #: [id number]     Serial #: [id number]     Status: active  Indications::  ICM, CHF   ICD Follow Up ICD Dependent:  No       ICD Device Measurements Configuration: BIPOLAR  Episodes Coumadin:  No  Brady Parameters Mode DDD     Lower Rate Limit:  50     Upper Rate Limit 120 PAV 170     Sensed AV Delay:  120  Tachy Zones VF:  222     VT:  200     VT1:  164     Impression & Recommendations:  Problem # 1:  SYSTOLIC HEART FAILURE, CHRONIC (ICD-428.22) She appears euvolemic today and well compensated. Her last ejection fraction was 45% in May of 2008. Her updated medication list for this problem includes:    Aspirin 81 Mg Tbec (Aspirin) .[name]... Take one tablet by mouth daily    Furosemide 40 Mg Tabs (Furosemide) .[name]... 1/2 tab by mouth once daily    Toprol Xl 50 Mg Xr24h-tab (Metoprolol succinate) .[name]... 1 tab two times a day    Ramipril 5 Mg Caps (Ramipril) .[name]... Take one capsule by mouth daily    Isosorbide Mononitrate Cr 30 Mg Xr24h-tab (Isosorbide mononitrate) .[name]... Take one tablet by mouth daily  Problem # 2:  CORONARY ARTERY DISEASE (ICD-414.00)  She has had multiple PCI and stent procedures. She has not had any recent chest pain. This appears stable. Her updated medication list for this problem includes:    Aspirin 81 Mg Tbec (Aspirin) .[name]... Take one tablet by mouth daily    Toprol Xl 50 Mg Xr24h-tab (Metoprolol succinate) .[name]... 1 tab two times a day    Ramipril 5 Mg Caps (Ramipril) .[name]... Take one capsule by mouth daily    Isosorbide Mononitrate Cr 30 Mg Xr24h-tab (Isosorbide mononitrate) .[name]... Take one tablet by  mouth daily  Orders: TLB-BMP (Basic Metabolic Panel-BMET) (80048-METABOL) TLB-BNP (B-Natriuretic Peptide) (83880-BNPR) TLB-CBC Platelet - w/Differential (85025-CBCD) TLB-Magnesium (Mg) (83735-MG)  Her updated medication list for this problem includes:    Aspirin 81 Mg Tbec (Aspirin) .[name]... Take one tablet by mouth daily    Toprol Xl 50 Mg Xr24h-tab (Metoprolol succinate) .[name]... 1 tab  two times a day    Ramipril 5 Mg Caps (Ramipril) .[name]... Take one capsule by mouth daily    Isosorbide Mononitrate Cr 30 Mg Xr24h-tab (Isosorbide mononitrate) .[name]... Take one tablet by mouth daily  Problem # 3:  IMPLANTABLE  DEFIBRILLATOR  CRT ST J (ICD-V45.02) Shas an ICD and biventricular pacemaker which was implanted before 2007. She saw Dr. Clide Cliff recently and is being followed by telephone and indicated she has about 4 months left on her battery life. She is planning on postponing any trips until after she has her generator change.  Problem # 4:  HYPERTENSION, BENIGN (ICD-401.1) This appears well controlled on current medications. Her updated medication list for this problem includes:    Aspirin 81 Mg Tbec (Aspirin) .[name]... Take one tablet by mouth daily    Furosemide 40 Mg Tabs (Furosemide) .[name]... 1/2 tab by mouth once daily    Toprol Xl 50 Mg Xr24h-tab (Metoprolol succinate) .[name]... 1 tab two times a day    Ramipril 5 Mg Caps (Ramipril) .[name]... Take one capsule by mouth daily  Patient Instructions: 1)  Your physician wants you to follow-up in: 6 months.  You will receive a reminder letter in the mail two months in advance. If you don't receive a letter, please call our office to schedule the follow-up appointment. 2)  Your physician recommends that you continue on your current medications as directed. Please refer to the Current Medication list given to you today. 3)  Your physician recommends that you have lab work today- bmet/cbc/bnp/magnesium (428.22;414.01)

## 2010-02-09 NOTE — Miscellaneous (Signed)
Summary: Device preload  Clinical Lists Changes  Observations: Added new observation of ICD IMPL DTE: 11/16/2009 (11/18/2009 7:31) Added new observation of ICD SERL#: [id number]  (11/18/2009 7:31) Added new observation of ICD MODL#: [id number]  (11/18/2009 2:13) Added new observation of ICDEXPLCOMM: 11/16/09 Linus Orn [id number] explanted  (11/18/2009 7:31)       ICD Specifications Following MD:  Sherryl Manges, MD     ICD Vendor:  St Jude     ICD Model Number:  [id number]     ICD Serial Number:  [id number] ICD DOI:  11/16/2009     ICD Implanting MD:  Sherryl Manges, MD  Lead 1:    Location: RA     DOI: 12/14/2003     Model #: [id number]     Serial #: [id number]     Status: active Lead 2:    Location: RV     DOI: 12/14/2003     Model #: 1581     Serial #: [id number]     Status: active Lead 3:    Location: LV     DOI: 12/14/2003     Model #: 1056T     Serial #: [id number]     Status: active  Indications::  ICM, CHF  Explantation Comments: 11/16/09 [facility] [id number] explanted  ICD Follow Up ICD Dependent:  No       ICD Device Measurements Configuration: BIPOLAR  Episodes Coumadin:  No  Brady Parameters Mode DDD     Lower Rate Limit:  50     Upper Rate Limit 120 PAV 170     Sensed AV Delay:  120  Tachy Zones VF:  222     VT:  200     VT1:  164

## 2010-02-09 NOTE — Progress Notes (Signed)
Summary: pt collapse    Phone Note Call from Patient Call back at Home Phone [phone] Call back at CELL# [phone]   Caller: Patient Reason for Call: Talk to Nurse Summary of Call: pt has some pain in chest and back right before christmas and she took a nitro and she colapse but they didn't find anything in the hospital but it happen again this morning about 3am but she feels fine now but she is concerned Initial call taken by: Omer Jack,  January 25, 2009 9:28 AM  Follow-up for Phone Call        [facility] @ cell # & home #. Sherri Rad, RN, BSN  January 25, 2009 11:26 AM   I spoke with the pt. She states that just before Christmas, she was at  church and ate some chili soup. She developed chest/back pain during the night (the pt also has a hiatal hernia). She took NTG and did not pass out, but her legs became very weak and she collapsed. Last night the pt got up about 3am to check and see if her burglar alarm was set. She got very weak in the legs again and even though she was holding on to her walker, her legs collapsed again. She does not think she actually blacked out.  The pt has been off her aspirin x 2 weeks in preparation for a mini face lift. She has cancelled this for thursday due to her incident last night. She has resumed her aspirin. She was off a few weeks ago for 2 weeks in preparation for her face lift, but had to r/s due to a cold. The pt states she has probably been off her aspirin in total for close to a month. Dr. Minda Ditto has her scheduled this thursday for a carotid US at [facility]. The pt denies any SOB/CP/ leg pain with her episode. I explained to the pt that she may be getting orthostatic. She is agreeable to come in tommorrow and let me check her orthostatic BP readings. We will proceed from there.  Follow-up by: Sherri Rad, RN, BSN,  January 25, 2009 3:18 PM

## 2010-02-09 NOTE — Progress Notes (Signed)
Summary: med change  Medications Added FUROSEMIDE 40 MG TABS (FUROSEMIDE) 1/2 tab by mouth once daily RAMIPRIL 5 MG CAPS (RAMIPRIL) Take one capsule by mouth daily       Phone Note Outgoing Call   Call placed by: Sherri Rad, RN, BSN,  February 03, 2009 6:08 PM Call placed to: Patient Summary of Call: I called the pt to advise her that per Dr. Regino Schultze discussion with Dr. Lorenz Coaster, Dr. Juanda Chance has decided to decrease Altace to 5mg  once daily and Lasix 20mg  once daily. The pt is aware of these changes. I also advised her that Dr. Juanda Chance prefers that the pt only be off aspirin a week total for her mini face lift. The pt will let Dr. Stephens November know.   Initial call taken by: Sherri Rad, RN, BSN,  February 03, 2009 6:11 PM    New/Updated Medications: FUROSEMIDE 40 MG TABS (FUROSEMIDE) 1/2 tab by mouth once daily RAMIPRIL 5 MG CAPS (RAMIPRIL) Take one capsule by mouth daily Prescriptions: RAMIPRIL 5 MG CAPS (RAMIPRIL) Take one capsule by mouth daily  #30 x 6   Entered by:   Sherri Rad, RN, BSN   Authorized by:   Lenoria Farrier, MD, [facility]   Signed by:   Sherri Rad, RN, BSN on 02/03/2009   Method used:   Electronically to        [facility] (retail)       [address]       [place], [place]  [address]       Ph: [phone] or [phone]       Fax: [phone]   RxID:   [id number]

## 2010-02-09 NOTE — Consult Note (Signed)
[facility]: [facility]   Imported By: Florinda Marker 03/02/2009 14:27:38  _____________________________________________________________________  External Attachment:    Type:   Image     Comment:   External Document

## 2010-02-09 NOTE — Letter (Signed)
Summary: Device-Delinquent Phone Journalist, newspaper, Main Office  [address]   [place], [place] [address]   Phone: [phone]  Fax: [phone]     August 12, 2009 MRN: [id number]   [facility] [address] [place], [place]  [address]   Dear [name],  According to our records, you were scheduled for a device phone transmission on 08-11-2009.     We did not receive any results from this check.  If you transmitted on your scheduled day, please call us to help troubleshoot your system.  If you forgot to send your transmission, please send one upon receipt of this letter.  Thank you,   Architectural technologist [facility]

## 2010-02-09 NOTE — Miscellaneous (Signed)
Summary: Problems and Medications updated  Clinical Lists Changes  Problems: Added new problem of PERSONAL HISTORY OF ARTHRITIS (ICD-V13.4) Added new problem of HEARTBURN (ICD-787.1) Added new problem of APPENDECTOMY, HX OF (ICD-V45.79) Added new problem of TOTAL KNEE REPLACEMENT, HX OF (ICD-V45.89) Added new problem of TONSILLECTOMY, HX OF (ICD-V45.79) Added new problem of UTI'S, RECURRENT (ICD-599.0) Added new problem of POSTMENOPAUSAL ATROPHIC VAGINITIS (ICD-627.3) Added new problem of FEMALE STRESS INCONTINENCE (ICD-625.6) Added new problem of CYSTITIS, CHRONIC (ICD-595.2) Medications: Added new medication of ESTRACE 0.1 MG/GM CREA (ESTRADIOL) Apply twice weekly per PCP

## 2010-02-15 ENCOUNTER — Encounter (INDEPENDENT_AMBULATORY_CARE_PROVIDER_SITE_OTHER): Payer: Medicare Other | Admitting: Internal Medicine

## 2010-02-15 ENCOUNTER — Encounter: Payer: Self-pay | Admitting: Internal Medicine

## 2010-02-15 DIAGNOSIS — Z95810 Presence of automatic (implantable) cardiac defibrillator: Secondary | ICD-10-CM

## 2010-02-15 DIAGNOSIS — I5032 Chronic diastolic (congestive) heart failure: Secondary | ICD-10-CM

## 2010-02-15 DIAGNOSIS — I2589 Other forms of chronic ischemic heart disease: Secondary | ICD-10-CM

## 2010-02-15 DIAGNOSIS — E785 Hyperlipidemia, unspecified: Secondary | ICD-10-CM

## 2010-02-23 NOTE — Assessment & Plan Note (Signed)
Summary: follow up/eph/mt/per amber/kl      Allergies Added:   Referring Provider:  n/a Primary Provider:  Leslee Home, MD    CC:  3 month follow up.  Pt states she is feeling well overall.  History of Present Illness: [name] is seen in followup for ischemic cardiomyopathy depressed left ventricular function and congestive heart failure. She is status post CRT-D implantation and recently underwent generator replacement. After long discussions she elected to pursue CRT-D replacement as opposed to pacemaker replacement.  Her most recent ejection fraction is 45% after biventricular pacing. She has systolic CHF. She has had no recent chest pain shortness of breath or palpitations.  She took her trip to [place] and had a great time. She is brought here for to keep at the beach. She loves walking on the sand even with her walker.     Current Medications (verified): 1)  Metamucil 48.57 % Powd (Psyllium) .... Two Times A Day 2)  Aspirin 81 Mg Tbec (Aspirin) .... Take One Tablet By Mouth Daily 3)  Prevacid 30 Mg Cpdr (Lansoprazole) .... One Tablet By Mouth Once Daily 4)  Furosemide 40 Mg Tabs (Furosemide) .... 1/2 Tab By Mouth Once Daily 5)  Toprol Xl 50 Mg Xr24h-Tab (Metoprolol Succinate) .[name].. 1 Tab Two Times A Day 6)  Ramipril 5 Mg Caps (Ramipril) .... Take One Capsule By Mouth Daily 7)  Isosorbide Mononitrate Cr 30 Mg Xr24h-Tab (Isosorbide Mononitrate) .... Take One Tablet By Mouth Daily 8)  Levothroid 125 Mcg Tabs (Levothyroxine Sodium) .[name].. 1 Tab Once Daily 9)  Tranxene-T 3.75 Mg Tabs (Clorazepate Dipotassium) .[name].. 1 Tab Prn 10)  Pravastatin Sodium 20 Mg Tabs (Pravastatin Sodium) .... Take One Tablet By Mouth Daily At Bedtime 11)  Nitrostat 0.4 Mg Subl (Nitroglycerin) .[name].. 1 Tablet Under Tongue At Onset of Chest Pain; You May Repeat Every 5 Minutes For Up To 3 Doses. 12)  Fexofenadine Hcl 180 Mg Tabs (Fexofenadine Hcl) .... Once Daily 13)  Cimetidine 400 Mg Tabs (Cimetidine)  .... Once Daily  Allergies (verified): 1)  ! Sulfa  Past History:  Past Medical History: Last updated: 04/09/2009 1. Recent hospitalization for chest pain 12/2007 felt to be noncardiac. 2. Coronary artery disease status post prior anterior wall myocardial     infarction and multiple percutaneous coronary intervention. 3. Ischemic cardiomyopathy, ejection fraction 25-30% improved to 45%     with biventricular pacing. 4. Systolic heart failure improved to class I to II. 5. Status post implantable cardioverter-defibrillator biventricular     pacer (Dec 2005).-- St. Jude Atlas 6. Hypertension. 7. Hyperlipidemia. 8. Gastroesophageal reflux disease.  9. Esophageal Stricture, last dilated 2007 10. Hiatal Hernia 11. History of severe diverticular disease, left colon 2004. 12. Urinary Incontenance 13. Recurrent UTIs (Klebsiella pneumoniae)     Last Cx 02-03-09 (R- ancef, Nitrofurantoin, Augmentin, Zosyn)  Past Surgical History: Last updated: 04/09/2009 Angioplasty/Stent Hysterectomy Tubal Ligation Appendectomy St. Jude Atlas-implantation-2005  Family History: Last updated: 09/15/2008 No FH of Colon Cancer: Family History of Colon Polyps: Sister Family History of Heart Disease: Mother, Father, Brothers, Sisters Family History of Prostate Cancer: Brother Family History of Diabetes: Aunt, Daughter  Social History: Last updated: 09/15/2008 SOCIAL HISTORY:  [name] recently lost her husband in 2009 also lost the son to brain tumor.  She has a son who lives in [place], [place] who was in an automobile accident for DWI.  He has had a long history of alcohol problem.  She has helped him out financially in the  past, but is ready to be much stricter. She has a daughter who lives in [place] with breast Ca. Patient has never smoked.  Alcohol Use - no  Vital Signs:  Patient profile:   77 year old female Height:      62 inches Weight:      143 pounds BMI:     26.25 Pulse rate:   80 /  minute Pulse rhythm:   regular BP sitting:   118 / 62  (right arm) Cuff size:   regular  Vitals Entered By: Judithe Modest CMA (February 15, 2010 9:14 AM)  Physical Exam  General:  The patient was alert and oriented in no acute distress. HEENT Normal.  Neck veins were flat, carotids were brisk.  Lungs were clear.  Heart sounds were regular without murmurs or gallops.  Abdomen was soft with active bowel sounds. There is no clubbing cyanosis or edema. Skin Warm and dry    EKG  Procedure date:  02/15/2010  Findings:      P. synchronous pacing at 80 with evidence of biventricular capture  PPM Specifications Following MD:  P. synchronous pacing with evidence of biventricular capture       ICD Specifications Following MD:  Sherryl Manges, MD     ICD Vendor:  St Jude     ICD Model Number:  [id number]     ICD Serial Number:  [id number] ICD DOI:  11/16/2009     ICD Implanting MD:  Sherryl Manges, MD  Lead 1:    Location: RA     DOI: 12/14/2003     Model #: [id number]     Serial #: [id number]     Status: active Lead 2:    Location: RV     DOI: 12/14/2003     Model #: [id number]     Serial #: [id number]     Status: active Lead 3:    Location: LV     DOI: 12/14/2003     Model #: [id number]     Serial #: [id number]     Status: active  Indications::  ICM, CHF  Explantation Comments: 11/16/09 [employer] [id number] explanted  ICD Follow Up Battery Voltage:  95% V     Charge Time:  8.0 seconds     Battery Est. Longevity:  5.9-7.0 yrs Underlying rhythm:  SR ICD Dependent:  No       ICD Device Measurements Atrium:  Amplitude: 4.2 mV, Impedance: 390 ohms, Threshold: 0.75 V at 0.5 msec Right Ventricle:  Amplitude: 12.0 mV, Impedance: 360 ohms, Threshold: 1.0 V at 0.5 msec Left Ventricle:  Impedance: 550 ohms, Threshold: 1.25 V at 0.8 msec Configuration: BIPOLAR Shock Impedance: 39 ohms   Episodes MS Episodes:  21     Percent Mode Switch:  <1%     Coumadin:  No Shock:  0     ATP:  0     Nonsustained:  0      Atrial Therapies:  0 Atrial Pacing:  1.7%     Ventricular Pacing:  97%  Brady Parameters Mode DDD     Lower Rate Limit:  50     Upper Rate Limit 120 PAV 170     Sensed AV Delay:  120  Tachy Zones VF:  222     VT:  200     VT1:  164     Next Remote Date:  05/18/2010     Next Cardiology Appt Due:  08/09/2010 Tech Comments:  21  AMS EPISODES--LONGEST WAS 3 MIN 52 SECONDS. NORMAL DEVICE FUNCTION.  NO CHANGES MADE. MERLIN 05-18-10 AND ROV IN 6 MTHS W/SK. Vella Kohler  February 15, 2010 10:26 AM  Impression & Recommendations:  Problem # 1:  PAROXYSMAL VENTRICULAR TACHYCARDIA (ICD-427.1)  no intercurrent ventricular tachycardia  Problem # 2:  DIASTOLIC HEART FAILURE, CHRONIC (ICD-428.32) stable on her current medications. With improved ejection fraction we will withhold Aldactone Her updated medication list for this problem includes:    Aspirin 81 Mg Tbec (Aspirin) .[name]... Take one tablet by mouth daily    Furosemide 40 Mg Tabs (Furosemide) .[name]... 1/2 tab by mouth once daily    Toprol Xl 50 Mg Xr24h-tab (Metoprolol succinate) .[name]... 1 tab two times a day    Ramipril 5 Mg Caps (Ramipril) .[name]... Take one capsule by mouth daily    Isosorbide Mononitrate Cr 30 Mg Xr24h-tab (Isosorbide mononitrate) .[name]... Take one tablet by mouth daily    Nitrostat 0.4 Mg Subl (Nitroglycerin) .[name]... 1 tablet under tongue at onset of chest pain; you may repeat every 5 minutes for up to 3 doses.  Problem # 3:  IMPLANTABLE  DEFIBRILLATOR  CRT ST J (ICD-V45.02) Device parameters and data were reviewed and no changes were made   Orders: EKG w/ Interpretation (93000)  Problem # 4:  CARDIOMYOPATHY, ISCHEMIC (ICD-414.8) stable on current medications  Her updated medication list for this problem includes:    Aspirin 81 Mg Tbec (Aspirin) .[name]... Take one tablet by mouth daily    Furosemide 40 Mg Tabs (Furosemide) .[name]... 1/2 tab by mouth once daily    Toprol Xl 50 Mg Xr24h-tab (Metoprolol succinate) .[name]... 1 tab two times a  day    Ramipril 5 Mg Caps (Ramipril) .[name]... Take one capsule by mouth daily    Isosorbide Mononitrate Cr 30 Mg Xr24h-tab (Isosorbide mononitrate) .[name]... Take one tablet by mouth daily    Nitrostat 0.4 Mg Subl (Nitroglycerin) .[name]... 1 tablet under tongue at onset of chest pain; you may repeat every 5 minutes for up to 3 doses.  Problem # 5:  HYPERTENSION, BENIGN (ICD-401.1) well-controlled  Her updated medication list for this problem includes:    Aspirin 81 Mg Tbec (Aspirin) .[name]... Take one tablet by mouth daily    Furosemide 40 Mg Tabs (Furosemide) .[name]... 1/2 tab by mouth once daily    Toprol Xl 50 Mg Xr24h-tab (Metoprolol succinate) .[name]... 1 tab two times a day    Ramipril 5 Mg Caps (Ramipril) .[name]... Take one capsule by mouth daily  Her updated medication list for this problem includes:    Aspirin 81 Mg Tbec (Aspirin) .[name]... Take one tablet by mouth daily    Furosemide 40 Mg Tabs (Furosemide) .[name]... 1/2 tab by mouth once daily    Toprol Xl 50 Mg Xr24h-tab (Metoprolol succinate) .[name]... 1 tab two times a day    Ramipril 5 Mg Caps (Ramipril) .[name]... Take one capsule by mouth daily  Problem # 6:  HYPERLIPIDEMIA-MIXED (ICD-272.4)  her last LFTs were November 2011; they were normal. Her last lipids a year ago had an HDL in the 40s and an LDL in the 60s Her updated medication list for this problem includes:    Pravastatin Sodium 20 Mg Tabs (Pravastatin sodium) .[name]... Take one tablet by mouth daily at bedtime  Her updated medication list for this problem includes:    Pravastatin Sodium 20 Mg Tabs (Pravastatin sodium) .[name]... Take one tablet by mouth daily at bedtime  Patient Instructions: 1)  Your physician recommends  that you schedule a follow-up appointment in: 6 months with Dr. Graciela Husbands 2)  Remote Merlin device check on May 18, 2010. 3)  Your physician recommends that you continue on your current medications as directed. Please refer to the Current Medication list given to you today.

## 2010-03-16 NOTE — Cardiovascular Report (Signed)
Summary: Office Visit   Office Visit   Imported By: Roderic Ovens 03/10/2010 11:41:13  _____________________________________________________________________  External Attachment:    Type:   Image     Comment:   External Document

## 2010-03-16 NOTE — Cardiovascular Report (Signed)
Summary: Office Visit   Office Visit   Imported By: Roderic Ovens 03/06/2010 15:37:43  _____________________________________________________________________  External Attachment:    Type:   Image     Comment:   External Document

## 2010-03-20 ENCOUNTER — Encounter: Payer: Self-pay | Admitting: *Deleted

## 2010-03-21 LAB — SURGICAL PCR SCREEN
MRSA, PCR: NEGATIVE
Staphylococcus aureus: NEGATIVE

## 2010-03-28 LAB — COMPREHENSIVE METABOLIC PANEL
Albumin: 3.9 g/dL (ref 3.5–5.2)
Alkaline Phosphatase: 108 U/L (ref 39–117)
BUN: 18 mg/dL (ref 6–23)
Potassium: 4.5 mEq/L (ref 3.5–5.1)
Sodium: 137 mEq/L (ref 135–145)
Total Protein: 6.4 g/dL (ref 6.0–8.3)

## 2010-03-28 LAB — URINE CULTURE: Colony Count: >=100000

## 2010-03-28 LAB — DIFFERENTIAL
Basophils Relative: 0 % (ref 0–1)
Eosinophils Absolute: 0.2 10*3/uL (ref 0.0–0.7)
Monocytes Absolute: 0.4 10*3/uL (ref 0.1–1.0)
Monocytes Relative: 5 % (ref 3–12)
Neutrophils Relative %: 85 % — ABNORMAL HIGH (ref 43–77)

## 2010-03-28 LAB — CBC
HCT: 39.5 % (ref 36.0–46.0)
Platelets: 187 10*3/uL (ref 150–400)
RDW: 13.1 % (ref 11.5–15.5)

## 2010-03-28 LAB — URINALYSIS, ROUTINE W REFLEX MICROSCOPIC
Bilirubin Urine: NEGATIVE
Protein, ur: NEGATIVE mg/dL
Urobilinogen, UA: 0.2 mg/dL (ref 0.0–1.0)

## 2010-04-01 ENCOUNTER — Emergency Department (HOSPITAL_COMMUNITY)
Admission: EM | Admit: 2010-04-01 | Discharge: 2010-04-01 | Disposition: A | Discharge status: Home / Self Care | Payer: Medicare Other | Attending: Emergency Medicine | Admitting: Emergency Medicine

## 2010-04-01 DIAGNOSIS — I10 Essential (primary) hypertension: Secondary | ICD-10-CM | POA: Insufficient documentation

## 2010-04-01 DIAGNOSIS — R5381 Other malaise: Secondary | ICD-10-CM | POA: Insufficient documentation

## 2010-04-01 DIAGNOSIS — E860 Dehydration: Secondary | ICD-10-CM | POA: Insufficient documentation

## 2010-04-01 DIAGNOSIS — I509 Heart failure, unspecified: Secondary | ICD-10-CM | POA: Insufficient documentation

## 2010-04-01 DIAGNOSIS — R197 Diarrhea, unspecified: Secondary | ICD-10-CM | POA: Insufficient documentation

## 2010-04-01 DIAGNOSIS — R112 Nausea with vomiting, unspecified: Secondary | ICD-10-CM | POA: Insufficient documentation

## 2010-04-01 DIAGNOSIS — Z95810 Presence of automatic (implantable) cardiac defibrillator: Secondary | ICD-10-CM | POA: Insufficient documentation

## 2010-04-01 DIAGNOSIS — E785 Hyperlipidemia, unspecified: Secondary | ICD-10-CM | POA: Insufficient documentation

## 2010-04-01 DIAGNOSIS — K219 Gastro-esophageal reflux disease without esophagitis: Secondary | ICD-10-CM | POA: Insufficient documentation

## 2010-04-01 DIAGNOSIS — I252 Old myocardial infarction: Secondary | ICD-10-CM | POA: Insufficient documentation

## 2010-04-01 DIAGNOSIS — E039 Hypothyroidism, unspecified: Secondary | ICD-10-CM | POA: Insufficient documentation

## 2010-04-01 LAB — DIFFERENTIAL
Basophils Absolute: 0 10*3/uL (ref 0.0–0.1)
Basophils Relative: 0 % (ref 0–1)
Eosinophils Relative: 2 % (ref 0–5)
Monocytes Absolute: 1.4 10*3/uL — ABNORMAL HIGH (ref 0.1–1.0)

## 2010-04-01 LAB — URINALYSIS, ROUTINE W REFLEX MICROSCOPIC
Bilirubin Urine: NEGATIVE
Ketones, ur: NEGATIVE mg/dL
Nitrite: NEGATIVE
Protein, ur: NEGATIVE mg/dL
Specific Gravity, Urine: 1.011 (ref 1.005–1.030)
Urobilinogen, UA: 0.2 mg/dL (ref 0.0–1.0)

## 2010-04-01 LAB — BASIC METABOLIC PANEL
Calcium: 10.5 mg/dL (ref 8.4–10.5)
GFR calc non Af Amer: >60 mL/min (ref >60)
Glucose, Bld: 96 mg/dL (ref 70–99)
Sodium: 131 mEq/L — ABNORMAL LOW (ref 135–145)

## 2010-04-01 LAB — CBC
HCT: 42 % (ref 36.0–46.0)
MCHC: 35.2 g/dL (ref 30.0–36.0)
RDW: 12.8 % (ref 11.5–15.5)

## 2010-04-01 LAB — URINE MICROSCOPIC-ADD ON

## 2010-04-10 LAB — POCT CARDIAC MARKERS: Troponin i, poc: <0.05 ng/mL (ref 0.00–0.09)

## 2010-04-11 LAB — POCT I-STAT, CHEM 8
Creatinine, Ser: 1 mg/dL (ref 0.4–1.2)
HCT: 34 % — ABNORMAL LOW (ref 36.0–46.0)
Hemoglobin: 11.6 g/dL — ABNORMAL LOW (ref 12.0–15.0)
Potassium: 3.4 mEq/L — ABNORMAL LOW (ref 3.5–5.1)
Sodium: 140 mEq/L (ref 135–145)
TCO2: 27 mmol/L (ref 0–100)

## 2010-04-11 LAB — URINALYSIS, ROUTINE W REFLEX MICROSCOPIC
Leukocytes, UA: NEGATIVE
Nitrite: POSITIVE — AB
Specific Gravity, Urine: 1.036 — ABNORMAL HIGH (ref 1.005–1.030)
pH: 7.5 (ref 5.0–8.0)

## 2010-04-11 LAB — URINE MICROSCOPIC-ADD ON

## 2010-04-11 LAB — BASIC METABOLIC PANEL
Chloride: 102 mEq/L (ref 96–112)
GFR calc Af Amer: >60 mL/min (ref >60)
GFR calc non Af Amer: >60 mL/min (ref >60)
Potassium: 3.3 mEq/L — ABNORMAL LOW (ref 3.5–5.1)

## 2010-04-11 LAB — DIFFERENTIAL
Eosinophils Absolute: 0.1 10*3/uL (ref 0.0–0.7)
Eosinophils Relative: 1 % (ref 0–5)
Lymphocytes Relative: 8 % — ABNORMAL LOW (ref 12–46)
Lymphs Abs: 0.9 10*3/uL (ref 0.7–4.0)
Monocytes Relative: 8 % (ref 3–12)
Neutrophils Relative %: 84 % — ABNORMAL HIGH (ref 43–77)

## 2010-04-11 LAB — POCT CARDIAC MARKERS
CKMB, poc: 1.2 ng/mL (ref 1.0–8.0)
Myoglobin, poc: 81.6 ng/mL (ref 12–200)

## 2010-04-11 LAB — CBC
HCT: 34.6 % — ABNORMAL LOW (ref 36.0–46.0)
MCV: 90.7 fL (ref 78.0–100.0)
RBC: 3.81 MIL/uL — ABNORMAL LOW (ref 3.87–5.11)
WBC: 10.9 10*3/uL — ABNORMAL HIGH (ref 4.0–10.5)

## 2010-04-11 LAB — URINE CULTURE

## 2010-04-11 LAB — APTT: aPTT: 26 seconds (ref 24–37)

## 2010-04-11 LAB — D-DIMER, QUANTITATIVE: D-Dimer, Quant: 1.68 ug/mL-FEU — ABNORMAL HIGH (ref 0.00–0.48)

## 2010-04-11 LAB — PROTIME-INR: Prothrombin Time: 13.9 seconds (ref 11.6–15.2)

## 2010-05-18 ENCOUNTER — Encounter: Payer: BLUE CROSS/BLUE SHIELD | Admitting: *Deleted

## 2010-05-23 NOTE — Assessment & Plan Note (Signed)
[facility]                         ELECTROPHYSIOLOGY OFFICE NOTE   NAME:[name], [name]                    MRN:          [id number]  DATE:07/10/2007                            DOB:          29-Oct-1921    HISTORY:  Ms. Kitchen comes in today in followup for her CRTD implanted.  She has undergone a terribly stressful time with recent death of her  husband following cancer, her son had heart problems, but died of  cancer, and her daughter being diagnosed with breast cancer.  Her other  son apparently has spent much of the time in jail with drugs, and over  the last 7 years though has been doing quite well.  She denies any  intercurrent problems with changes in her functional status without  chest pains or worsening shortness of breath.   MEDICATIONS:  Unchanged.   PHYSICAL EXAMINATION:  Her blood pressure is 104/62 with a pulse of 69.  Her neck veins were 7.  Her lungs were clear.  Her heart sounds were  regular.  Abdomen was soft with active bowel sounds.  The extremities  were without peripheral edema.   IMPRESSION:  1. Coronary artery disease with prior myocardial infarction.  2. Congestive heart failure - chronic systolic.  3. Status post cardiac resynchronization therapy - implantable      cardioverter-defibrillator.   Ms. Toda is doing surprisingly well given her situation.   We will see her again in 1 year's time and she will follow remotely in  the interim.     Duke Salvia, MD, [facility]  Electronically Signed    SCK/MedQ  DD: 07/10/2007  DT: 07/11/2007  Job #: [id number]   cc:   Reuben Likes, M.D.

## 2010-05-23 NOTE — H&P (Signed)
[name], COVEY             ACCOUNT NO.:  [id number]   MEDICAL RECORD NO.:  [id number]          PATIENT TYPE:  OBV   LOCATION:  2006                         FACILITY:  [facility]   PHYSICIAN:  Bevelyn Buckles. Bensimhon, MDDATE OF BIRTH:  [birth year]   DATE OF ADMISSION:  01/02/2008  DATE OF DISCHARGE:                              HISTORY & PHYSICAL   Primary cardiologist, Dr. Charlies Constable.   PRIMARY CARE PHYSICIAN:  Dr. Leslee Home.   REASON FOR ADMISSION:  Chest and epigastric pain.   [name] is an 77 year old woman with a history of hypertension,  hyperlipidemia, esophageal stricture depression congestive heart failure  secondary to ischemic cardiomyopathy and EF of 25% now 45% status post  biV ICD and coronary artery disease status post previous anterior MI  with a totally occluded LAD and several previous stents to the RCA.  Last catheterization was in December 2005.  This showed a normal left  main, LAD was totally occluded in the mid section with right-to-left  collaterals. Left circumflex had an 80% distal lesion which was stented  with a Taxus 2.75x15 mm drug-eluting stent. The RCA and 10% in-stent  restenosis and 50% distal lesion.  EF was 25%.  She subsequently  received a biV ICD. Her echocardiogram in May of 2008 showed EF of 45%  with moderate to severe mitral regurgitation.   She has been having a difficult time lately due to the death of her  husband and loss of a son due to a brain tumor in this past year.  Over  the past few days she has felt weak. Today she had what she calls chest  tightness but points to her epigastric area.  This started early this  morning. It is not worse with exertion or eating. There has been no  response to nitroglycerin.  EKG showed a V pacing.  First set of cardiac  markers is negative.  She has had mild nausea but no vomiting.  No  fevers. No chills.  No orthopnea or PND.  No lower extremity edema.   REVIEW OF SYSTEMS:  As per HPI  and problem list.  Otherwise all systems  negative.   PROBLEM LIST:  1. Coronary artery disease as described above.  2. Congestive heart failure secondary to ischemic cardiomyopathy.      a.     Status post biV ICD.      b.     Most recent echocardiogram ejection fraction now 45% with       moderate to severe mitral regurgitation (EF previously 25%).  3. Situational depression due to recent death of her husband and her      son.  4. History of esophageal stricture.  5. Hypertension.  6. Hyperlipidemia.  7. Osteoarthritis.  8. Hypothyroidism.  9. Anemia.   CURRENT MEDICATIONS:  Benazepril 10 mg a day, simvastatin 40 a day,  Coreg 25 b.i.d., Plavix 75 a day, aspirin 81, Prilosec, Arthrotec, Lasix  40 a day, Synthroid 137 mcg a day, Lexapro 10 mg a day.   ALLERGIES:  No known drug allergies.   SOCIAL HISTORY:  She lives  alone.  She is recently widowed.  She does  not smoke or drink alcohol.   FAMILY HISTORY:  Noncontributory.   PHYSICAL EXAMINATION:  She is an elderly frail woman in no acute  distress.  VITAL SIGNS:  Blood pressure is 140, heart rate 75, temperature is 98.2.  HEENT:  Normal.  NECK:  Supple.  The JVP is about 6-7 cm of water with CV waves.  Carotids are 2+ bilaterally without any bruits. There is no  lymphadenopathy or thyromegaly.  CARDIAC:  She has very distant heart sounds.  She is regular. No obvious  murmur.  Cannot hear a mitral regurgitation.  There is no S3.  LUNGS:  Clear.  ABDOMEN: Soft, nontender, nondistended. No hepatosplenomegaly.  No  bruits, no masses.  Good bowel sounds.  EXTREMITIES:  Warm.  No cyanosis, clubbing, or edema.  NEURO:  Alert and oriented x3.  Cranial nerves II-XII are intact.  Moves  all four extremities without difficulty.  Affect is flattened.   EKG shows sinus rhythm with V pacing.   Chest chest x-ray is normal.   White count is 10.9, hemoglobin 11, platelets 209. Troponin 0.02.  Sodium 137, potassium 4.1, BUN 26,  creatinine 1, glucose is 181.   ASSESSMENT:  1. Atypical chest and epigastric pain.  2. History of coronary artery disease and congestive heart failure as      described above.  3. Situational depression.   PLAN/DISCUSSION:  I do not think [name]'s pain is cardiac in  nature.  However, given her history will bring her in for 23-hour  observation and formal rule-out myocardial infarction. Will check LFTs,  amylase and lipase.  Possible outpatient Myoview, will leave this to the  discretion of Dr. Juanda Chance.      Bevelyn Buckles. Bensimhon, MD  Electronically Signed     DRB/MEDQ  D:  01/02/2008  T:  01/02/2008  Job:  [id number]   cc:   Everardo Beals. Juanda Chance, MD, [facility]  Reuben Likes, M.D.

## 2010-05-23 NOTE — Progress Notes (Signed)
[facility]                        PERIPHERAL VASCULAR OFFICE NOTE   NAME:Sissel, SHAKARA TWEEDY                    MRN:          [id number]  DATE:07/09/2006                            DOB:          1921-05-05    PRIMARY CARE PHYSICIAN:  Reuben Likes, M.D.   CLINICAL HISTORY:  Ms. Moeser is 77 years old and returns for  management of congestive heart failure and ischemic heart disease.  She  has coronary artery disease and has had multiple coronary interventions.  Has ischemic cardiomyopathy with an ejection fraction that has improved  from about 25-30% to 45% following ICD by the pacer implantation.  She  says she has been doing fairly well recently, although she does say that  she gives out easily.  She has no shortness of breath.  She has had no  chest pain, and she has had occasional palpitations.  She just got back  from a trip to [place].   Her past medical history is significant for GERD, hypertension,  hyperlipidemia, and venous insufficiency of the lower extremities.   Her current medications include multivitamins, thyroxin,  Benazepril/hydrochlorothiazide, simvastatin, Coreg, Plavix, furosemide,  diclofenac, aspirin, and Prilosec.   PHYSICAL EXAMINATION:  VITAL SIGNS:  Blood pressure 126/64, pulse 78 and  regular.  NECK:  There was no venous distention.  The carotid pulses were full.  CHEST:  Clear without rales or rhonchi.  CARDIAC:  Rhythm was regular.  I could hear no murmurs or gallops.  ABDOMEN:  Soft.  Normal bowel sounds.  There was no hepatosplenomegaly.  EXTREMITIES:  No peripheral edema.  Pedal pulses were full.   We interrogated her pacemaker, and she was atrial tracking most of the  time and ventricular pacing almost all of the time.  Her lead impedances  and thresholds were good on all three leads.  She had no ventricular  tachycardia.   IMPRESSION:  1. Congestive heart failure, now compensated, probably class 2.  2.  Coronary artery disease, status post multiple percutaneous coronary      interventions.  3. Ejection fraction 25-30%, improved to 45% following biventricular      pacing.  4. Status post implantable cardioverter/defibrillator biventricular      pacer.  5. Hypertension.  6. Hyperlipidemia.  7. Gastroesophageal reflux disease.  8. Venous insufficiency in the lower extremities.   RECOMMENDATIONS:  I think Ms. Guyett is doing quite well.  Her ejection  fraction is improved dramatically with BiV pacing.  We will get a BNP,  BMP, and CBC on her today.  I will plan to see her back in four months.   ADDENDUM:  Her BUN and creatinine have gone from 25/1 to 51/1.3 on June 12, 2006.  We will cut her Lasix back from 80 b.i.d. to 80 in the morning  and 40 in the afternoon.  We are going to recheck that today and decide  if we need to cut back her Lasix further if her renal function is any  worse today than it was in June.     Bruce Elvera Lennox Juanda Chance, MD, [facility]  Electronically Signed  BRB/MedQ  DD: 07/09/2006  DT: 07/10/2006  Job #: [id number]

## 2010-05-23 NOTE — Assessment & Plan Note (Signed)
[facility]                            CARDIOLOGY OFFICE NOTE   NAME:Mullenbach, TA FAIR                    MRN:          [id number]  DATE:02/11/2007                            DOB:          [birth year]    PRIMARY CARE PHYSICIAN:  Reuben Likes, M.D.   CLINICAL HISTORY:  Ms. Mormile is 77 years old and returned for follow-  up management of her congestive heart failure and coronary disease.  She  has had prior myocardial infarctions and percutaneous coronary  interventions and has an ischemic cardiomyopathy with an ejection  fraction initially as low as 25%.  She had congestive heart failure and  underwent placement of a BiV pacer and ICD with marked improvement.  The  ejection fraction improved to 45%.   She says she has been doing quite well.  She has had no chest pain or  major short major shortness of breath.  She does have occasional  palpitations.  She feels like she is getting along quite well.   PAST MEDICAL HISTORY:  1. Hypertension.  2. Hyperlipidemia.  3. GERD.   CURRENT MEDICATIONS:  1. Levothyroxine.  2. Furosemide.  3. Benazepril/hydrochlorothiazide 10.  4. Simvastatin 40.  5. Coreg 25 b.i.d.  6. Plavix.  7. Aspirin.  8. Prilosec.  9. Arthrotec.   PHYSICAL EXAMINATION:  VITAL SIGNS:  The blood pressure is 160/73 and  the pulse 69 and regular.  NECK:  There was no venous distension.  The carotid pulses were full  without bruits.  CHEST:  Clear.  CARDIAC:  Rhythm was regular.  I could hear no murmurs or gallops.  ABDOMEN:  Soft with normal bowel sounds.  EXTREMITIES:  Peripheral pulses were full, and there as no peripheral  edema.   SOCIAL HISTORY:  Her son died recently of a brain tumor.  There was a  great deal of stress involved with his fourth wife regarding the estate.  She has a daughter who lives in [place] and was diagnosed with breast  cancer.   RECOMMENDATIONS:  I think Ms. Ruffino is doing quite well.   Will plan to  cut her Lasix back from 80 to 40 a day.  We will get a BMP and CBC  today.  Will plan to see her back in 6 months.     Bruce Elvera Lennox Juanda Chance, MD, [facility]  Electronically Signed    BRB/MedQ  DD: 02/11/2007  DT: 02/12/2007  Job #: [id number]

## 2010-05-23 NOTE — Assessment & Plan Note (Signed)
[facility]                            CARDIOLOGY OFFICE NOTE   NAME:Bajaj, MECHILLE VARGHESE                    MRN:          [id number]  DATE:11/06/2006                            DOB:          04-May-1921    CLINICAL HISTORY:  [name] is 77 years old and has an ischemic  cardiomyopathy with congestive heart failure and is status post ICD and  Bi-V pacer implantation.  She has also had multiple coronary inventions  including stenting of the right coronary artery and circumflex artery  and a known chronic total occlusion of the LAD.  Her ejection fraction  was 25%, but improved to 45% following her ICD implantation.   She says she has been doing quite well and has no recent chest pain or  shortness of breath.  She does have occasional palpitations.  She has  been under some increased stress recently.  Her son recently died of a  brain tumor and he did have several problems leading up to that.  She  has a daughter who lives here in [place] who recently had a diagnosis  of breast cancer.   PAST MEDICAL HISTORY:  1. Hypertension.  2. Hyperlipidemia.  3. GERD.   CURRENT MEDICATIONS:  Levothyroxine, benazepril/hydrochlorothiazide,  simvastatin, Coreg, Plavix, Diclofenac, aspirin, Prilosec and  furosemide.   PHYSICAL EXAMINATION:  VITAL SIGNS:  Blood pressure 130/66, pulse 67 and  regular.  NECK:  No venous distention.  The carotid pulses were full without  bruits.  CHEST:  Clear without rales or rhonchi.  HEART:  Cardiac rhythm was regular.  I could hear no murmurs or gallops.  ABDOMEN:  Soft with normal bowel sounds.  EXTREMITIES:  Peripheral pulses were full and there is no peripheral  edema.   We interrogated her pacemaker and she was pacing the ventricle 100% of  the time and the atrium only a very small percentage of the time.  She  has good thresholds on both the right and left ventricle and atrium.  She had no episodes of ventricular  tachycardia and no mode switches.   IMPRESSION:  1. Coronary artery disease, status post prior anterior wall infarction      and prior multiple percutaneous interventions.  2. Ischemic cardiomyopathy and ejection fraction 25-30% improved to      45% following Bi-V pacing.  3. Status post implantable cardioverter-defibrillator and Bi-V      pacemaker implantation.  4. Congestive heart failure related to systolic left ventricular      dysfunction, Class II.  5. Hypertension.  6. Hyperlipidemia.  7. Gastroesophageal reflux disease.   RECOMMENDATIONS:  I think Ms. Cichowski is doing well at present.  Her  renal function has been borderline.  Will cut her Lasix back from 40 mg  in the morning and a half of a 40 mg in the afternoon, to just 40 mg in  the morning.  She recently had laboratory studies with Dr. Lorenz Coaster and we  will see if we can obtain those and we will plan to see her back in 4  months.  If she has any recurrence in  swelling, she is to reinstitute  the half tablet of Lasix in the afternoon and let us known.     Bruce Elvera Lennox Juanda Chance, MD, [facility]  Electronically Signed    BRB/MedQ  DD: 11/06/2006  DT: 11/07/2006  Job #: [id number]   cc:   Reuben Likes, M.D.

## 2010-05-23 NOTE — Assessment & Plan Note (Signed)
[facility]                            CARDIOLOGY OFFICE NOTE   NAME:[name], Tracy SCHOENFELDER                    MRN:          [id number]  DATE:08/12/2007                            DOB:          Jun 14, 1921    PRIMARY CARE PHYSICIAN:  Tracy Likes, MD.   CLINICAL HISTORY:  [name] is 77 years old and returns for followup  management of coronary heart disease and congestive heart failure.  She  has had prior myocardial infarctions and prior percutaneous  interventions, and has ischemic cardiomyopathy with an ejection fraction  that was as low as 25%.  She was treated with an ICD Bi-V pacer and her  ejection fraction improved to 45%, and a congestive heart failure  symptoms also improved.   Unfortunately, her husband, almost 68 years, died about 4 months ago of  cancer.  She also recently lost a son due to a brain tumor 2 months  before that.  She is coping fairly well with this.  She done fairly well  from the standpoint of her heart.  She has had no recent chest pain or  palpitations and no change in her shortness of breath.   PAST MEDICAL HISTORY:  Significant for hypertension, hyperlipidemia, and  GERD.   CURRENT MEDICATIONS:  1. Benazepril/hydrochlorothiazide 10 mg daily.  2. Simvastatin 40 mg daily.  3. Coreg 25 mg b.i.d.  4. Plavix 75 mg daily.  5. Aspirin.  6. Prilosec.  7. Arthrotec.  8. Furosemide 40 mg daily.  9. Levothyroxine.  10.Lexapro, which is new and prescribed by Dr. Lorenz [name].   PHYSICAL EXAMINATION:  VITAL SIGNS:  Her blood pressure is 128/70 and  pulse 64 and regular.  NECK:  There was no venous distension.  The carotid pulses were full  without bruits.  CHEST:  Clear.  CARDIAC:  Rhythm was regular.  I can hear no murmurs or gallops.  ABDOMEN:  Soft with normal bowel sounds.  EXTREMITIES:  Peripheral pulses were equal and there is no peripheral  edema.   Electrocardiogram showed atrial tracking and ventricular  pacing.   IMPRESSION:  1. Coronary heart disease status post prior anterior wall myocardial      infarction and multiple percutaneous interventions.  2. Ischemic cardiomyopathy, ejection fraction of 25-30% improved to      45% on biventricular pacing implantable cardioverter-defibrillator.  3. Congestive heart failure.  Systolic history of  heart failure class      II.  4. Hypertension.  5. Hyperlipidemia.  6. Gastroesophageal reflux disease.   RECOMMENDATIONS:  I think [name] is doing well from the standpoint  of her heart.  We will get a BMP and CBC on her today.  We will not make  any change in her medications.  I will see her back in 6 months.     Tracy Elvera Lennox Juanda Chance, MD, [facility]  Electronically Signed    BRB/MedQ  DD: 08/12/2007  DT: 08/13/2007  Job #: [id number]

## 2010-05-23 NOTE — Assessment & Plan Note (Signed)
[facility]                            CARDIOLOGY OFFICE NOTE   NAME:Mahadeo, KIMBERLLY NORGARD                    MRN:          [id number]  DATE:01/23/2008                            DOB:          1921-04-27    PRIMARY CARE PHYSICIAN:  Reuben Likes, MD   CLINICAL HISTORY:  Ms. Mersman is 77 year old and returned for followup  visit after recent overnight hospitalization.  She came to the emergency  room on Christmas eve with the chest pain, was kept overnight, and ruled  out and we allowed her to go home and she subsequently had a Myoview  scan, which showed some mild apical ischemia and just appears of a known  total LAD.   She done well since that time, has had no recurrent chest pain,  shortness breath, or palpitations.   She has a history of prior myocardial infarctions and prior percutaneous  interventions, has ischemic cardiomyopathy, ejection fraction that was  as low as 25%, but improved to 45% following implantation of a BiV pacer  and ICD.  She had congestive heart failure which was class III and is  improved to class I to II.   PAST MEDICAL HISTORY:  Significant for hypertension, hyperlipidemia, and  GERD.   CURRENT MEDICATIONS:  1. Benazepril/hydrochlorothiazide 10 daily.  2. Simvastatin 40 mg daily.  3. Metamucil.  4. Coreg 25 mg b.i.d.  5. Plavix.  6. Aspirin 81 mg daily.  7. Prilosec 20 mg daily,.  8. Arthrotec.  9. Furosemide 40 mg daily.  10.Levothyroxine.  11.Lexapro.   PHYSICAL EXAMINATION:  VITAL SIGNS:  The blood pressure was 145/70, the  pulse 70 and regular.  NECK:  There was no venous tension.  The carotid pulses were full  without bruits.  CHEST:  Clear.  CARDIAC:  Rhythm was regular.  No murmurs or gallops.  ABDOMEN:  Soft with normal bowel sounds.  EXTREMITIES:  Peripheral pulses were equal and there is no peripheral  edema.   IMPRESSION:  1. Recent hospitalization for chest pain felt to be noncardiac.  2.  Coronary artery disease status post prior anterior wall myocardial      infarction and multiple percutaneous coronary intervention.  3. Ischemic cardiomyopathy, ejection fraction 25-30% improved to 45%      with biventricular pacing.  4. Systolic heart failure improved to class I to II.  5. Status post implantable cardioverter-defibrillator biventricular      pacer.  6. Hypertension.  7. Hyperlipidemia.  8. Gastroesophageal reflux disease.   SOCIAL HISTORY:  Ms. Westley recently lost her husband last year and  also lost the son to brain tumor.  She has a son who lives in [place],  [place] who was in an automobile accident for DWI.  He has had a long  history of alcohol problem.  She has helped him out financially in the  past, but is ready to be much stricter.   RECOMMENDATIONS:  I think, Ms. Nau is doing very well.  We will not  make any changes in her therapy.  She recently had lab work in the  hospital, so do  not need to do that day.  I will see her back in 6  months and she has a schedule for followup with her ICD.     Bruce Elvera Lennox Juanda Chance, MD, [facility]  Electronically Signed    BRB/MedQ  DD: 01/23/2008  DT: 01/24/2008  Job #: [id number]

## 2010-05-23 NOTE — Letter (Signed)
Jun 07, 2006    Bruce R. Juanda Chance, MD, [facility]  [address]  [place], [place] [address]   RE:  HAWRAA, STAMBAUGH  MRN:  [id number]  /  DOB:  04/28/1921   Dear Smitty Cords:   Ms. Peppel was set up to see [place] today.  As you know, she has ischemic  heart disease and mitral regurgitation.  She came in about three weeks  ago and saw Jacolyn Reedy, PA-C, who undertook a Myoview and an  echocardiogram.  The Myoview showed what sounds like pretty stable  perfusion defects, compared to a couple of years ago.  Her  echocardiogram, however, was notable for the inter-current deterioration  of her mitral regurgitation from what was described as mild to moderate  to severe.   She has had some peripheral edema.  She has had increasing shortness of  breath and occasional episodes of nocturnal dyspnea.  She has tried to  further exclude salt from her diet without significant success.   Her home blood pressures are about 120.   CURRENT MEDICATIONS:  1. Thyroxine 150.  2. Benazepril 10.  3. Diclofenac.  4. Simvastatin.  5. Coreg 25 mg b.i.d.  6. Plavix 75.  7. Furosemide 80 mg daily.  8. She also takes aspirin 81 mg.   PHYSICAL EXAMINATION:  VITAL SIGNS:  Blood pressure 143/70, pulse 78.  LUNGS:  Clear.  HEART:  Sounds regular and indeed I could not hear a murmur, even going  out to her back.  EXTREMITIES:  Had 1+ peripheral edema.   IMPRESSION:  1. Congestive heart failure - acute on chronic systolic.  2. Coronary artery disease with      a.     Prior myocardial infarction and prior percutaneous coronary       intervention with stable Myoview scan.  3. Mitral regurgitation - worsening.  4. Status post [employer] implantable cardioverter      defibrillator, with normal function.  5. Relative hypertension.   Bruce, I have taken the privilege of having Ms. Kuba come back to see  you in about one month.  In the interim she is going to [place].  I have  asked that she  increase her Lasix 80 mg daily to 80 mg b.i.d. for five  days.  In addition, will plan to check her BMET next Thursday, prior to  her leaving, to make sure that she does not need any augmented potassium  repletion.   Further, I have asked that she take her Benazepril 10 mg b.i.d. to  decrease after-load and hopefully decrease her mitral regurgitation.  We  will plan to see her again in one year's time as part of the device  followup and otherwise we will have her follow up with you.    Sincerely,      Duke Salvia, MD, [facility]  Electronically Signed    SCK/MedQ  DD: 06/07/2006  DT: 06/07/2006  Job #: [id number]   CC:    Reuben Likes, M.D.

## 2010-05-24 ENCOUNTER — Encounter: Payer: Self-pay | Admitting: *Deleted

## 2010-05-26 NOTE — H&P (Signed)
[name], [name]             ACCOUNT NO.:  [id number]   MEDICAL RECORD NO.:  [id number]          PATIENT TYPE:  EMS   LOCATION:  MAJO                         FACILITY:  [facility]   PHYSICIAN:  Doylene Canning. Ladona Ridgel, M.D.  DATE OF BIRTH:  10/07/1921   DATE OF ADMISSION:  10/13/2003  DATE OF DISCHARGE:                                HISTORY & PHYSICAL   ADMISSION DIAGNOSES:  Unstable angina and congestive heart failure.   CHIEF COMPLAINT:  Difficulty breathing and my chest hurts.   HISTORY OF PRESENT ILLNESS:  The patient is a very pleasant 77 year old  woman with a history of known coronary disease and mild LV dysfunction in  the past with left bundle branch block. She underwent catheterization  initially in 1999 and then again in May 2003. At that time she underwent  angioplasty of the right coronary artery. She has a chronically occluded  LAD. The patient has a history of congestive heart failure and left bundle  branch block as noted. She also has a history of bilateral knee replacement  surgeries and a history of thyroid disease. She also has a history of  esophageal stricture. The patient states that she was in her usual state of  health until several weeks ago when she noted worsening exercise intolerance  with dyspnea on exertion. Over the last week or so she has had chest  pressure with exertion as well. She also complains of headache. She denies  peripheral edema.   FAMILY HISTORY:  Noncontributory at her advanced age.   SOCIAL HISTORY:  The patient denies tobacco or ethanol abuse.   REVIEW OF SYSTEMS:  Negative for vision or hearing problems, except that she  has had cataract surgery in the past. She has had cough or hemoptysis. She  denies nausea, vomiting, diarrhea, or constipation. She does have chest pain  and shortness of breath as previously noted. There is no arm or shoulder  pain with her chest pain. She describes it as a chest tightness and chest  pressure-type  sensation. Her denies nausea, vomiting, diarrhea, or  constipation. She denies arthritic complaint at present. She denies skin  changes. The rest of her review of systems is negative.   PHYSICAL EXAMINATION:  GENERAL: She is a pleasant, well-appearing, elderly  woman in no address.  VITAL SIGNS: Blood pressure 145/76, pulse 76 and irregular with frequent  premature beats, respirations 18, temperature 98.  HEENT: Normocephalic and atraumatic. Pupils equal and round. Oropharynx is  moist. Sclerae anicteric.  NECK: Without any jugular venous distention or thyromegaly. Trachea midline.  LUNGS: Clear bilaterally to auscultation. No rhonchi or wheezes. Rales in  the bases bilaterally.  CARDIOVASCULAR: Regular rate and rhythm with normal S1 and S2. There is an  S4 gallop.  ABDOMEN: Soft, nontender, nondistended. There is no organomegaly.  EXTREMITIES: No clubbing, cyanosis, or edema.  SKIN: No lesions.  NEUROLOGIC: Cranial nerves II-XII intact. Strength is 4+/5 and symmetric.  The rest of her neurologic exam was nonfocal.   EKG demonstrates a sinus rhythm with a left bundle branch block.   IMPRESSION:  1.  Congestive heart failure.  2.  Unstable angina.  3.  History of coronary disease.   PLAN:  Will admit her to the hospital and obtain a right and left heart  catheterization. Will start her on IV heparin, nitroglycerin, Lasix,  Integrilin, Plavix, and beta blocker. Will check a BNP. She may be a  candidate for __________ therapy depending on her LV function.       GWT/MEDQ  D:  10/14/2003  T:  10/14/2003  Job:  [id number]   cc:   Reuben Likes, M.D.  [address]  [place]  [place] [address]  Fax: [phone]

## 2010-05-26 NOTE — Op Note (Signed)
NAMEWILLETTA, YORK             ACCOUNT NO.:  [id number]   MEDICAL RECORD NO.:  [id number]          PATIENT TYPE:  INP   LOCATION:  4735                         FACILITY:  [facility]   PHYSICIAN:  Doylene Canning. Ladona Ridgel, M.D.  DATE OF BIRTH:  1921-10-03   DATE OF PROCEDURE:  12/14/2003  DATE OF DISCHARGE:                                 OPERATIVE REPORT   PROCEDURE PERFORMED:  Implantation of a biventricular ICD.   INDICATIONS FOR PROCEDURE:  Ischemic cardiomyopathy with left bundle branch  block, ejection fraction 25%, and congestive heart failure, class 3.   I:  Introduction.  The patient is an 77 year old woman with known coronary  artery disease status post angioplasty in the past.  The patient has a  history of severe LV dysfunction with ejection fraction of 25%.  She has  developed class III heart failure.  This is despite maximal medical therapy.  She has left bundle branch block and for this reason, is referred for BiV  ICD implantation.   II:  PROCEDURE:  After informed consent was obtained, the patient was taken  to the diagnostic EP lab in a fasting state.  After the usual preparation  and draping, intravenous Fentanyl and Midazolam were given for sedation.  30  mL of lidocaine was infiltrated into the left infraclavicular region.  A 9  cm incision was carried out over this region and electrocautery utilized to  dissect down to the fascial plane.  The subclavian vein was punctured x 3  after 10 mL of IV contrast was injected into the left upper extremity  demonstrating the vein to be patent.  The St. Jude model 1581, 65 cm active  fixation defibrillation lead was then advanced into the right ventricle and  placed in the RV septum.  The R waves there measured 9-10 millivolts and  with the lead actively fixed, the pacing impedance was 593 ohms and the  pacing threshold 0.3 volts at 0.5 milliseconds.  10 volt pacing did not  stimulate the diaphragm.  With the defibrillation lead in  satisfactory  position in the right ventricle, attention was turned to placement of the  atrial lead.  It was placed in the right atrial appendage with the P waves  measuring 3 millivolts and the atrial threshold 0.8 volts at 0.5  milliseconds.  The pacing impedance with the lead actively fixed was 447  ohms.  At this point, 10 volts stimulation was delivered demonstrating no  diaphragmatic stimulation.  With both atrial and defibrillation leads in  satisfactory position, attention was turned to placement of the left  ventricular  lead.  The coronary sinus was cannulated without particular  difficulty and venography of the coronary sinus was then carried out.  The  St. Jude model 1056 86 cm passive fixation defibrillation lead, serial  number [id number], was then advanced into a lateral vein approximately 2/3 from  base to apex.  At this location, the LV waves measured 15 millivolts and the  pacing threshold 1.4 volts at 0.5 milliseconds.  10 volts pacing did not  stimulate the diaphragm.  Pacing impedance was 1100 ohms.  At this point,  the guiding catheter was then removed and the leads were secured to the  subpectoralis fascia with a figure-of-eight silk suture.  In addition, the  sewing sleeves were secured with silk suture.  Electrocautery was utilized  to make a subcutaneous pocket.  Kanamycin irrigation was utilized to  irrigate the pocket and electrocautery utilized to assure hemostasis.  The  St. Jude Atlas Plus HF model V-343 biventricular defibrillator, serial  number [id number], was then connected to the atrial, defibrillation, and LV  pacing leads and placed back in the subcutaneous pocket.  Defibrillation  threshold testing was then carried out.   After the patient was more deeply sedated with Fentanyl and Versed, VF was  induced with a T-wave shock.  A 15 joule shock was initially delivered and  terminated ventricular fibrillation and restored sinus rhythm.  Five minutes  was  allowed to elapse and a second DFT test carried out.  Again VF was  induced with a T wave shock and, again, a 15 joule shock was delivered,  again terminating ventricular fibrillation and restoring sinus rhythm.  At  this point, no additional defibrillation threshold testing was carried out  and the incision was closed with a layer of 2-0 Vicryl followed by a layer  of 3-0 Vicryl followed by a layer of 4-0 Vicryl.  Benzoin was painted on the  skin, Steri-Strips were applied, and a pressure dressing was placed, and the  patient was returned to her room in satisfactory condition.   III:  COMPLICATIONS:  There were no immediate procedure complications.   IV:  RESULTS:  Successful implantation of a St. Jude biventricular  defibrillator in a patient with an ischemic cardiomyopathy, ejection  fraction 25%, class 3 heart failure, and left bundle branch block.       GWT/MEDQ  D:  12/14/2003  T:  12/14/2003  Job:  [id number]   cc:   Reuben Likes, M.D.  [address]  [place]  [place] [address]  Fax: [phone]   Charlies Constable, M.D. [facility]

## 2010-05-26 NOTE — Discharge Summary (Signed)
[place]  Patient:    [name], [name] Visit Number: [id number] MRN: [id number]          Service Type: CAT Location: 6500 [id number] Attending Physician:  Lenoria Farrier Dictated by:   Pennelope Bracken, N.P. Admit Date:  05/15/2001 Disc. Date: 05/16/01                             Discharge Summary  DATE OF BIRTH:  January 10, 1947.  REASON FOR ADMISSION:  Exertional chest pain.  DISCHARGE DIAGNOSES:  1. Coronary artery disease, cardiac catheterization in September 1999 showing     left anterior descending totalled, 70% diagonal, 99% diagonal two, 60%     circumflex, 50% right coronary artery, ejection fraction 50-55%.     Cardiolite performed January 2003, ejection fraction 42%, status post     percutaneous transluminal coronary angioplasty of the left anterior     descending coronary artery.  2. History of congestive heart failure.  3. History of left bundle branch block.  4. History of osteoarthritis, bilateral feet.  5. Left knee replacement.  6. Hypertension.  7. Family history of coronary artery disease.  8. Hyperthyroid disease.  9. History  of esophageal strictures. 10. Abnormality on chest x-ray, with follow-up chest CT suggesting a left     upper lobe lesion, was benign and could represent an osteochondroma.  PRIMARY CARE PHYSICIAN:  Dr. Lorenz Coaster.  HISTORY OF PRESENT ILLNESS:  This delightful 77 year old lady with medical history as outlined above was admitted for cardiac catheterization to further evaluate progressive exertional chest pain and tightness while on nitrate therapy.  HOSPITAL COURSE:  Patient was admitted in stable condition.  Her nitrate dose was increased.  She was taken to the catheterization lab by Dr. Juanda Chance on May 8 with findings as follows:  LAD totally occluded, 70% stenosis distally. Circumflex had a 40% mid- and 60% distal stenoses.  The RCA showed 90% mid- and 90% distal vessel occlusions.  A left  ventriculogram revealed inferior and apical hypokinesis with an ejection fraction around 55%.  A stent was placed in the mid-RCA, reducing stenosis there from 90 to less than 10, and a stent was placed in the distal RCA, also reducing that 90% stenosis to less than 10. The patient tolerated the procedure well and recovered uneventfully.  Patient was found to be suitable for discharge by Dr. Juanda Chance.  DISCHARGE PHYSICAL EXAMINATION:  VITAL SIGNS:  Blood pressure 97/50, pulse 63, respirations 20, pulse oximetry 96% on room air.  Temperature 97.8.  GENERAL:  The patient offered no complaints of chest pain, dyspnea, or palpitation.  The patient was in no acute distress.  NECK:  Without JVD or bruit.  CHEST:  Lungs clear to auscultation bilaterally.  CARDIAC:  Regular rate and rhythm without murmur, rub, or gallop.  EXTREMITIES:  Without edema.  Catheter stick site in the right femoral area was without signs of hematoma or bleeding.  LABORATORY DATA:  At discharge, patients hemogram was as follows:  WBC 5.9, hemoglobin 11.8, hematocrit 34.0, and platelets 224.  Admission chemistry revealed sodium of 140, potassium 4.4, BUN is 30 and creatinine 0.9, with glucose at 121.  Chest x-ray performed at [facility] on May 7 revealed no interval change in linear fibrosis at the medial right middle lobe.  Heart size was upper limits of normal.  This represented no interval change since May 07, 1997.  EKG at  discharge revealed normal sinus rhythm with signs of left bundle branch block, signs of left axis deviation.  DISPOSITION:  The patient is discharged to home in the care of her family.  DISCHARGE MEDICATIONS:  1. Aspirin 325 mg one q.d.  2. Plavix 75 mg one p.o. q.d.  3. Protonix 40 mg one q.h.s.  4. Premarin 1.25 mg one q.d.  5. Toprol XL 100 mg one q.d.  6. Calcium carbonate 500 mg one q.d.  7. Lotensin 20 mg one q.d.  8. Hydrochlorothiazide 12.5 mg q.d.  9. Synthroid 137  mcg one q.d. 10. There is apparently an additional supplemental medication that the patient     takes on the advice of her internist. 11. Imdur 60 mg one q.d. as needed. 12. Nitroglycerin 0.4 mg sublingual one SL every five minutes p.r.n. chest     pain.  DISCHARGE INSTRUCTIONS:  Activity restrictions:  No heavy lifting, driving, straining, or sexual activity for two days.  Diet recommended:  Low fat, low cholesterol.  The patient agrees to call if her groin wound becomes hard or painful.  Call the office.  SPECIAL INSTRUCTIONS:  The patient is encouraged to perform 30 minutes of consistent activity on her stationary bike every day as her lower extremity osteoarthritis allows.  FOLLOW-UP:  With Dr. Juanda Chance May 20 at 3 oclock.  She agrees to call in the interim with any questions, problems, or concerns, or change or increase in symptoms. Dictated by:   Pennelope Bracken, N.P. Attending Physician:  Lenoria Farrier DD:  05/16/01 TD:  05/16/01 Job: [id number]

## 2010-05-26 NOTE — Assessment & Plan Note (Signed)
[name]                           GASTROENTEROLOGY OFFICE NOTE   NAME:[name], [name]                    MRN:          [id number]  DATE:11/19/2005                            DOB:          08-Aug-1921    [name] is a delightful 77 year old white female with history of  coronary artery disease, gastroesophageal reflux, esophageal stricture and  recurrent solid food dysphagia.  She also has a history of severe  diverticulosis of the left colon, shown on last colonoscopy in October,  2004.  She had a small polyp removed at that time which was not retrieved.  She now has noticed solid food dysphagia, especially with apples or certain  solid foods, not with liquids.  She also has some odynophagia, especially  with carbonated drinks or with coffee.   MEDICATIONS:  1. Multiple vitamins.  2. Levothyroxine 150 mcg daily.  3. Prevacid 30 mg p.o. daily.  4. Benazepril HCL 10 mg p.o. daily.  5. Diclofenac 75 mg decreased from b.i.d. to daily.  6. Simvastatin 40 mg p.o. daily.  7. Stool softener or Metamucil b.i.d.  8. Coreg 25 mg p.o. b.i.d.  9. Plavix 75 mg p.o. daily.  10.Furosemide 80 mg p.o. daily.   PHYSICAL EXAM:  Blood pressure 112/66, pulse 64 and weight 156 pounds.  Patient was alert, oriented, no distress.  LUNGS:  Clear to auscultation.  COR:  With normal S1, normal S2.  ABDOMEN:  Soft, mildly tender in epigastric and subxiphoid area and across  the supraumbilical area.  No tenderness in left or right upper quadrant or  lower quadrants.  Bowel sounds were normal.  EXTREMITIES:  No edema.   IMPRESSION:  1. An 77 year old white female with recurrent solid food dysphagia and a      history of benign distal esophageal stricture, was dilated in 2004.      Her symptoms are suggestive of recurrent esophageal stricture,      epigastric pain may be due to esophagitis or even esophageal ulcer,      rule out Barrett's esophagus.  2. History  of severe diverticular disease of the left colon.  The patient      is taking stool softeners and fiber supplements.  3. Coronary artery disease status post percutaneous coronary      interventions.  Patient currently on Plavix and aspirin.   PLAN:  1. The patient is scheduled for upper endoscopy with esophageal      dilatation.  Because of risk of clotting of her stents, we will keep      her on Plavix and aspirin for the procedures, which will have to be      done very carefully under conscious sedation.  2. Increase Prevacid to 30 mg p.o. b.i.d. then switch to Prilosec 20 mg      p.o. b.i.d. for cost reasons.  3. Decrease Voltaren to 75 mg p.o. daily.     [name]. Juanda Chance, MD  Electronically Signed    DMB/MedQ  DD: 11/19/2005  DT: 11/19/2005  Job #: [id number]   cc:   Reuben Likes, M.D.

## 2010-05-26 NOTE — Cardiovascular Report (Signed)
[name], [name]             ACCOUNT NO.:  [id number]   MEDICAL RECORD NO.:  [id number]          PATIENT TYPE:  INP   LOCATION:  6533                         FACILITY:  [facility]   PHYSICIAN:  Charlies Constable, M.D. [facility] DATE OF BIRTH:  09-07-1921   DATE OF PROCEDURE:  10/14/2003  DATE OF DISCHARGE:                              CARDIAC CATHETERIZATION   INDICATIONS FOR PROCEDURE:  The patient is 77 years old and has documented  coronary artery disease with previous anterior wall infarction and stenting  of the right coronary artery x2 in May of 2003.  She was admitted yesterday  with chest pain and shortness of breath with signs of mild congestive heart  failure.  She was scheduled for evaluation with angiography today.   DESCRIPTION OF PROCEDURE:  The procedure was performed via the right femoral  artery using arterial sheath and 6 French preformed coronary catheters.  A  front wall arterial puncture was performed and Omnipaque contrast was used.  After completion of the diagnostic study, we made the decision to do  intervention on the lesion in the distal circumflex artery.   The patient had been on Integrilin, but she developed a slight hematoma  around her right femoral artery site, so we stopped the Integrilin and used  Angiomax boluses and infusion.  She was given 300 mg of Plavix as well.  We  used the CLS 3.56 guiding catheter with sideholes and a short Luge wire.  We  crossed the lesion in the distal circumflex artery with the wire without  difficulty.  We stented the vessel directly with a 2.75 x 16 mm Taxus stent,  deploying this with one inflation of 14 atmospheres for 30 seconds.  We then  postdilated with a 3.0 x 12 mm Quantum Maverick performing one inflation up  to 16 atmospheres for 30 seconds.  Repeat diagnostic studies were then  performed through the guiding catheter.  The patient tolerated the procedure  well and left the laboratory in satisfactory condition.   RESULTS:  The right atrial pressure was 4 mean, the pulmonary artery  pressure was 26/11 with a mean of 18.  The pulmonary wedge pressure was 9.  The left ventricular pressure was 113/13, the aortic pressure was 113/65.   1.  The left main coronary artery was free of significant disease.  2.  The left anterior descending artery gave rise to a large septal      perforator and small diagonal branch and then was completely occluded.  3.  The circumflex artery gave rise to a marginal branch and a large      posterolateral branch.  There was 80% stenosis in the distal circumflex      artery before the posterolateral branch.  4.  The right coronary artery is a very large dominant vessel that gave rise      to a right ventricular branch, a posterior descending branch, and four      posterolateral branches.  There was less than 10% stenoses at the stent      sites in the mid and distal right coronary artery.  There  was 50%      narrowing before the last two posterolateral branches.  There were      collaterals from the right ventricular branch to the LAD and there were      distal lesions in the LAD of 70 and 80%.  5.  The left ventriculogram performed in the RAO projection was somewhat      under-opacified.  The apex was hypokinetic and the inferior wall was      hypokinetic.  The estimated ejection fraction was 25%.   Following stenting of the lesion in the distal circumflex artery, the  stenosis improved from 80% to 0%.   CONCLUSION:  1.  Coronary artery disease, status post prior anterior wall myocardial      infarction and prior stenting of the right coronary artery with less      than 10% stenosis at the stent sites in the mid and distal right      coronary artery and 50% narrowing in the posterolateral branch of the      right coronary artery, 80% narrowing in the distal circumflex artery,      and total occlusion of the left anterior descending.  2.  Apical and inferior wall  hypokinesis with an estimated ejection fraction      of 25%.  3.  Successful stenting of the lesion in the distal circumflex artery using      a Taxus drug-eluding stent with improvement in percent diameter      narrowing from 80% to 0%.   DISPOSITION:  The patient was returned to the postanesthesia care unit for  further observation.  The lesion in the distal circumflex artery was only  part of the patient's problem.  She also probably had congestive heart  failure on admission, although, her volume status is euvolemic or slightly  over-diuresed.  Will probably keep her in the hospital an extra day to  adjust her medicines for her ischemic cardiomyopathy.       BB/MEDQ  D:  10/14/2003  T:  10/15/2003  Job:  [id number]

## 2010-05-26 NOTE — Assessment & Plan Note (Signed)
[facility]                              CARDIOLOGY OFFICE NOTE   NAME:[name], [name]                    MRN:          [id number]  DATE:09/03/2005                            DOB:          1921-03-06    PRIMARY CARE PHYSICIAN:  Reuben Likes, MD   CLINICAL HISTORY:  [name] is 77 years old and has coronary artery  disease with a previous anterior wall infarction, previous stenting of both  the right and circumflex arteries.  She has ischemic cardiomyopathy and has  an implantable ICD by the pacer.  Her ejection fraction improved from 25-30%  to 35-40% by last echocardiogram.   When I saw her last time, we thought she had mild volume overload and  increased her Lasix to 80 mg a day.  She says she has done well since that  time.  She has had occasional palpitations but no angina.  She has to walk  with a walker due to her back but she has remained active and is traveling.  She has been to [place] and plans to go to [place] next month.  She has  shortness of breath just with moderate exertion.   PAST MEDICAL HISTORY:  1. Hypertension.  2. Hyperlipidemia.  3. GERD.   CURRENT MEDICATIONS:  Levothyroxine, Prevacid,  benazepril/hydrochlorothiazide, hydroxyzine, diclofenac, simvastatin, Coreg,  Plavix, and __________.   PHYSICAL EXAMINATION:  VITAL SIGNS:  Blood pressure 118/64, pulse 70 and  regular.  NECK:  No venous distention.  Carotid pulses were full without bruits.  CHEST:  Clear.  CARDIAC:  Rhythm was regular.  I could hear no murmurs or gallops.  ABDOMEN:  Soft without organomegaly.  EXTREMITIES:  Peripheral pulses are full.  No peripheral edema.   LABORATORY DATA:  An electrocardiogram showed atrial tracking and  ventricular pacing.   She has a Best boy in and she is on the house-call remote  testing with EP followups yearly.  Will pull up her last Teletrace data.  The [employer] Jude data does not give [place] impedance  values to track her congestive  heart failure according to [place].   IMPRESSION:  1. Coronary artery disease, status post prior anterior wall myocardial      infarction, status post stenting circumflex and right coronary artery      with drug-eluting stents.  2. Ischemic cardiomyopathy with ejection fraction of 25-30%, improved to      35-40% following biventricular pacing.  3. Status post implantable cardioverter defibrillator  biventricular pacer      with an Atlas St[employer] Jude device.  4. Hypertension.  5. Hyperlipidemia.  6. Gastroesophageal reflux disease.  7. Venous insufficiency in the lower extremities.  8. Status post implantable cardioverter defibrillator implantation.   RECOMMENDATIONS:  I think [name] is doing well.  Her volume status  appears good.  Will plan to get a BNP on her today.  I will continue to  follow her with telephone checks of her ICD BiV pacer without house-call.  I  will plan to see her back in six months.  Bruce Elvera Lennox Juanda Chance, MD, [facility]    BRB/MedQ  DD:  09/03/2005  DT:  09/04/2005  Job #:  [id number]

## 2010-05-26 NOTE — Cardiovascular Report (Signed)
[place]. [facility]  Patient:    [name] Visit Number: [id number] MRN: [id number]          Service Type: CAT Location: 6500 [id number] Attending Physician:  Lenoria Farrier Dictated by:   Everardo Beals Juanda Chance, M.D. [facility] Proc. Date: 05/15/01 Admit Date:  05/15/2001 Discharge Date: 05/16/2001   CC:         Cardiopulmonary Laboratory  Reuben Likes, M.D.   Cardiac Catheterization  PROCEDURES PERFORMED: Cardiac catheterization and percutaneous coronary intervention.  CLINICAL HISTORY: The patient is 77 years old and has documented coronary disease with a previous intervention on the LAD which subsequently occluded. She was recently out [name] and had an episode of chest pain, was seen in the hospital for this. She continued to have recurrent angina and I saw her and we arranged for her to come in for catheterization. She wanted to delay the procedure because of an anniversary, and so it was scheduled for today.  DESCRIPTION OF PROCEDURE: The procedure was performed via the right femoral artery using an arterial sheath and 6 French preformed coronary catheters.  A front wall arterial puncture was performed and Omnipaque contrast was used. After completion of the diagnostic study, we made a decision to proceed with intervention on the right coronary artery.  The patient was given Angiomax bolus and infusion and was given 300 mg of Plavix. We used a 6 [place] guiding catheter with side holes and a short BMW wire. We crossed the lesions in the mid and distal right coronary artery with the wire without difficulty. We pre-dilated with a 3.5 x 15 mm Quantum Maverick. We performed one inflation of 8 atmospheres for 30 seconds in the distal lesion and one inflation of 8 atmospheres for 30 seconds in the proximal lesion. We then stented the distal lesion with a 3.0 x 13 mm Zeta deploying this with one inflation of 12 atmospheres for 30 seconds.  We  then deployed a 3.5 x 18 mm Zeta in the lesion in the midportion of the right coronary artery and deployed this with one inflation of 10 atmospheres for 30 seconds. We then post-dilated with a 3.5 x 15 mm Quantum Maverick.  We positioned a balloon just inside the proximal edge and performed one inflation up to 16 atmospheres for 30 seconds. Repeat diagnostic studies were then performed through the guiding catheter. The patient tolerated the procedure well and left the laboratory in satisfactory condition.  RESULTS: Left main coronary artery: The left main coronary artery was free of significant disease.  Left anterior descending: The left anterior descending artery gave rise to two diagonal branches and two septal perforators and then was completely occluded but recanalized. The recanalization filled the midportion but the vessel was again occluded in its mid to distal portion. The first diagonal branch had a 70% ostial stenosis. The very distal LAD filled via collaterals from a right ventricular branch off the right coronary artery.  The circumflex artery: The circumflex artery gave rise to a marginal branch and two posterolateral branches. There was 40% narrowing in the mid circumflex artery and 60% narrowing in the distal circumflex artery.  Right coronary artery: The right coronary artery was a moderately large vessel that gave rise to a right ventricular branch, posterior descending branch and four posterolateral branches. There was 90% narrowing in the midportion of the vessel and there was another 90% lesion in the distal portion of the vessel just before the bend.  LEFT  VENTRICULOGRAPHY: The left ventriculogram performed in the RAO projection showed hypokinesis of a small portion of the apex and hypokinesis of the inferior wall.  The estimated ejection fraction was 55%.  Following stenting of the lesion in the mid right coronary artery, the stenosis improved from 90% to less  than 10%.  Following stenting of the lesion in the distal right coronary artery, the stenosis improved from 90% to less than 10%.  CONCLUSIONS: 1. Coronary artery disease, status post prior percutaneous coronary    intervention with 70% stenosis in the first diagonal branch of the    left anterior descending and total occlusion of the mid left anterior    descending (old), 40% mid and 60% distal stenosis in the circumflex artery,    90% mid and 90% distal stenosis in the right coronary artery with    apical wall hypokinesis and inferior wall hypokinesis. 2. Successful stenting of tandem lesions in the mid and distal right coronary    artery with improvement in the mid lesion from 90% to less than 10% and    improvement in the distal lesion from 90% to less than 10%.  DISPOSITION: The patient was returned to the postangioplasty unit for further observation. Dictated by:   Everardo Beals Juanda Chance, M.D. [facility] Attending Physician:  Lenoria Farrier DD:  05/15/01 TD:  05/18/01 Job: [id number]

## 2010-05-26 NOTE — Op Note (Signed)
NAME:  [name], [name]                       ACCOUNT NO.:  [id number]   MEDICAL RECORD NO.:  [id number]                   PATIENT TYPE:  INP   LOCATION:  2887                                 FACILITY:  [facility]   PHYSICIAN:  Lenard Galloway. Chaney [name], M.D.           DATE OF BIRTH:  [birth year]   DATE OF PROCEDURE:  02/03/2002  DATE OF DISCHARGE:                                 OPERATIVE REPORT   PREOPERATIVE DIAGNOSIS:  Severe osteoarthritis, right knee.   POSTOPERATIVE DIAGNOSIS:  Severe osteoarthritis, right knee.   PROCEDURE:  Total knee replacement on the right using a keeled tibial tray  size 3 cemented with a standard right cemented femoral component, a three-  pegged cemented patella, and a 15 mm poly insert.   SURGEON:  Lenard Galloway. Chaney [name], M.D.   ASSISTANT:  Legrand Pitts. Duffy, P.A.   ANESTHESIA:  General.   DRAINS:  Hemovac.   COMPLICATIONS:  None.   DESCRIPTION OF PROCEDURE:  After satisfactory general anesthesia, a  pneumatic tourniquet was placed about the right upper thigh.  The right  lower extremity was prepped with Duraprep and draped out in the usual  manner.  A Vi-Drape was placed over the operative site.  The right lower  extremity was then wrapped out with an Esmarch and the tourniquet was  elevated.  A straight incision made extending above the patella and carried  down to the tibial tubercle.  Skin edges were retracted.  Bleeders were  coagulated.  A long medial parapatellar incision was made and the patella  was everted laterally.  The knee was then flexed to 90 degrees.  Both the  medial and lateral meniscus were excised.  Tibial guide #1 was placed over  the anterior aspect of the tibia and was placed at what was felt the  appropriate cutting level.  This was fixed with fixation pins.  The proximal  end of the tibia was amputated and a nice cut was achieved.  The remnants of  the menisci were excised.  The posterior fat pad about the patella was  partially  removed.  Excellent access to the joint was achieved.  The femur  was sized and a notch was placed over the anterior aspect of the femur.  A  standard femoral guide was placed over the distal end of the femur and drill  holes were made, the intramedullary rod inserted.  The C-clamp was inserted  into femoral guide #1 with the knee flexed to 90 degrees.  This set up the  proper rotation.  Fixation pins were then inserted in the femoral guide.  Using a capture guide, anterior and posterior cuts were made.  Femoral guide  #2 was placed over the anterior aspect of the femur, held in place with the  intramedullary rod.  This was placed at what was felt to be the appropriate  level and fixed with fixation pins.  Just the end of the femur  was then  amputated.  The guide was removed and the spacer block was inserted.  With a  15 mm spacer block, there was excellent balancing of the collateral  ligaments both in flexion and extension.  Femoral guide #3 was placed over  the distal end of the femur, locked in place with fixation pins.  Chamfer  cuts were made and drill holes were placed on the distal end of the femur.  All excess bone was removed.  The tibia was then subluxed anteriorly with a  McCale retractor.  A size 3 trial was placed over the proximal end of the  proximal end of the tibia and seated very nicely.  This was fixed in place  with fixation pins.  The tower was applied and the drill hole was placed for  the keel.  The keel was then placed in the guide and driven flush.  A 15 mm  poly was inserted and the trial femoral component was inserted.  The knee  was put through a full range of flexion and extension.  In extension this  appeared to be stable, was flexed easily to 140 degrees.  There was no  spinning of the poly.  With the knee in extension, the posterior aspect of  the pat ella was then amputated.  Three holes were placed for the posterior  aspect of the patella.  The trial  patella was inserted and the knee was put  through a full range of motion.  A lateral release was not needed.  There  was nice tracking of the patella in the midline with no tilt.  All the  components were removed.  The pulse saline lavage was used to remove all  debris.  Glue was mixed and the tibial component was inserted first, the  femoral component second with a trial poly inserted, and glue was placed in  the posterior aspect of the patella and the patella was inserted and held in  place with a clamp.  All excess glue was removed.  Once the glue had set up,  the trial poly was removed and the tourniquet was dropped.  All bleeders  were coagulated.  Good hemostasis was achieved.  The knee was irrigated once  again with copious amounts of saline solution.  Excess glue was removed with  a small osteotome.  Throughout the procedure the knee was irrigated with  antibiotic solution.  The final 15 mm poly was then inserted in place.  The  knee was then put through a full range of motion.  There was a wonderful  flexion and extension and excellent stability.  The Hemovac was inserted and  the long parapatellar incision was closed with the knee flexed using heavy  Ethibond.  Vicryl was used to close the subcutaneous tissue and skin staples  used to close the skin.  Sterile dressings were applied.  The patient turned  to recovery in excellent condition.  Technically this went extremely well.                                               [name], M.D.    RAM/MEDQ  D:  02/03/2002  T:  02/03/2002  Job:  [id number]
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
## 2010-05-26 NOTE — Discharge Summary (Signed)
[name], [name]             ACCOUNT NO.:  [id number]   MEDICAL RECORD NO.:  [id number]          PATIENT TYPE:  INP   LOCATION:  3734                         FACILITY:  [facility]   PHYSICIAN:  Charlies Constable, M.D. [facility] DATE OF BIRTH:  Aug 28, 1921   DATE OF ADMISSION:  10/13/2003  DATE OF DISCHARGE:  10/15/2003                                 DISCHARGE SUMMARY   PRIMARY CARE PHYSICIAN:  Dr. Leslee Home in [place]   DISCHARGE DIAGNOSES:  1.  Admitted with chest pain/dyspnea.  2.  Status post left heart catheterization October 14, 2003.  Previous tandem      stents to the right coronary artery are patent.  She had a stent placed      in the left circumflex reducing an 80% lesion to zero by Dr. Charlies Constable.  She will be on 6 months Plavix with aspirin coverage.  3.  Ischemic cardiomyopathy with ejection fraction 25% at this left heart      catheterization.   SECONDARY DIAGNOSES:  1.  History of anterior wall myocardial infarction.  2.  History of coronary artery disease.  The patient is status post stents      x2 to the right coronary artery.  It is known that the left anterior      descending is 100% occluded at the first diagonal and there are distal      80 and 70% tandem stenoses.  The left anterior descending feeds by right-      to-left collaterals.  3.  Left bundle branch block with QRS about 160 msec.  4.  Congestive heart failure II-II symptoms.  5.  Hypothyroidism.  6.  Status post bilateral total knee arthroplasties.  7.  Esophageal stricture.   PROCEDURE:  Left heart catheterization October 14, 2003.  This study shows  that the tandem stents in the mid and distal right coronary artery have less  than 10% in-stent restenoses and the left circumflex had a distal lesion of  80% which was reduced to zero with a stent.  The left anterior descending  proved to be 100% occluded after the first diagonal.  There were tandem 80%  and then 70% stenoses in the distal left  anterior descending coronary artery  fed by right-to-left collaterals.  She had global hypokinesis with ejection  fraction 25%.   DISCHARGE DISPOSITION:  Ms. [name] is ready for discharge  postoperative day #2 after undergoing left heart catheterization with stent  placement.  She is achieving 92% oxygen saturation on room air.  Her  postoperative laboratories all look good and complete blood count on  postoperative #1 white cells 8.6 hemoglobin 11.5, hematocrit 32.7, platelets  219.  Her serum electrolytes on postoperative procedure day #1 sodium 136,  potassium 3.8, chloride 103, bicarbonate 30, BUN 15, creatinine 0.9, glucose  115.  Her right groin is healing well without bruit and right lower  extremity is well perfused.  Telemetry shows sinus rhythm with occasional  PVCs.  Her medications have been adjusted to optimize her congestive heart  failure symptoms in light of ischemic  cardiomyopathy.   DISCHARGE MEDICATIONS:  1.  Coreg 25 mg b.i.d.  2.  Lasix 40 mg daily.  3.  Benazepril 20 mg daily.  4.  Aspirin 325 mg daily.  5.  Plavix 75 mg daily.  6.  Synthroid 150 mcg daily.  7.  Prevacid 30 mg daily.  8.  Isosorbide mononitrate 60 mg to be taken as needed.  9.  Potassium chloride 20 mEq daily.  10. For pain management, Tylenol 325 mg one to two tabs every 4-6 hours as      needed for pain.  11. She also takes nitroglycerin 0.4 mg one tab under the tongue every 5      minutes x3 doses as needed for chest pain.  12. Hydroxizine 12.5 mg daily as needed.   She is asked to stop taking her Toprol which she had taken previously and  also to stop taking hydrochlorothiazide.  If her benazepril is a  benazepril/hydrochlorothiazide combination antihypertensive, she is asked to  stop taking that and switch to benazepril.   DISCHARGE DIET:  Low sodium, low cholesterol diet.  She is urged to keep her  salt content low.   She may shower, calling [phone] if she experiences pain  or swelling at the  cath site.  She has an office visit with Dr. Juanda Chance Monday, November 01, 2003, at 10 o'clock in the morning and she will get echocardiogram at Dr.  Regino Schultze office Thursday, October 28, 2003, at 11:30.  Dr. Juanda Chance wishes to  reassess her left ventricular function as an outpatient.   BRIEF HISTORY:  The patient is a pleasant 77 year old female.  She has a  known history of coronary artery disease and mild left ventricular  dysfunction.  She has left bundle branch block.  She underwent  catheterization initially in 1999 and then again in May of 2003.  In May,  she underwent angioplasty of the right coronary artery.  She has a  chronically occluded LAD.  The patient has history of congestive heart  failure.  She also has a history of bilateral knee replacement surgeries and  a history of thyroid disease.  She also has a history of esophageal  stricture.  The patient states she was in her usual state of health until  several weeks ago when she noted worsening exercise intolerance with dyspnea  on exertion.  Within the last week or so, she has had chest pressure with  exertion as well.  She also complains of headache.  She denies peripheral  edema.  The plan is to admit her to the hospital and start on IV heparin,  nitroglycerin, Lasix, Integrilin, Plavix and a beta-blocker.  BNP will be  checked and she will have left and right heart catheterization.  She may be  a candidate for a device placement depending on LV function.   HOSPITAL COURSE:  The patient was admitted to [facility] through  the emergency room complaining of chest pain and progressive dyspnea on  exertion.  Because of her prior cardiac history and risk factors, she  underwent left heart catheterization October 6th with a finding of a distal  lesion of the left circumflex.  This was stented at the time of the catheterization reducing an 80% stenosis to zero.  The patient has done well  since the  procedure and has had no further chest pain or shortness of  breath.  She goes home with the medication and follow up as dictated.   OVERALL PLAN:  1.  To change from Toprol to Coreg 25 mg daily.  2.  Increase Lasix from 20 mg daily to 40 mg daily.  3.  Supply potassium chloride supplementation 20 mEq daily.  4.  Add Plavix to aspirin 75 mg daily for 6 months for stent protection.  5.  Continue Synthroid at 150 mcg daily.  6.  This patient may qualify under MADIT II indications for ICD and with      wide QRS possible __________      placement.  Repeat echocardiogram will be taken as an outpatient to      verify her ejection fraction.  The patient also gives a history of tachy      palpitations presyncope.  Perhaps a Holter monitor might help      distinguish a tachyarrhythmia.       GM/MEDQ  D:  10/15/2003  T:  10/16/2003  Job:  [id number]   cc:   Reuben Likes, M.D.  [address]  [place]  [place] [address]  Fax: [phone]

## 2010-05-26 NOTE — H&P (Signed)
NAME:  [name], [name]                       ACCOUNT NO.:  [id number]   MEDICAL RECORD NO.:  [id number]                   PATIENT TYPE:  INP   LOCATION:  NA                                   FACILITY:  [facility]   PHYSICIAN:  Rodney A. Chaney Malling, M.D.           DATE OF BIRTH:  02-05-1921   DATE OF ADMISSION:  02/03/2002  DATE OF DISCHARGE:                                HISTORY & PHYSICAL   CHIEF COMPLAINT:  Right knee pain for the last year.   HISTORY OF PRESENT ILLNESS:  This 77 year old white female patient presented  to Dr. Chaney Malling with a history of a left knee replacement by Dr. Chaney Malling  on Jun 01, 1997.  She has done well with her left knee but has had probably  a four to five year history of gradual onset but progressively worsening  right knee pain.  She did have a right knee arthroscopy on August 08, 1999,  and did have arthritic changes noted in the knee at that time.  Since that  knee scope the pain in her knee has been getting worse.  She has had no  other injury to her knee.   At this point in the pain in her right knee is present mostly when she is  walking. It is described as an aching sensation over the medial joint line  without radiation.  She was having some severe pain in the knee about  Thanksgiving time and it seemed to be hurting more in the popliteal area and  that was treated with a cortisone shot by Dr. Chaney Malling and that relieved  all the popliteal pain that she was having.  The pain in the knee does  increase with any walking and decreases with sitting and the cortisone shot.  The knee does pop, grind and occasionally give way.  There is no night pain,  locking, catching, or edema.  She has been using either a cane or a crutch  in 2001 to assist her with ambulation.  She is currently taking diclofenac  for pain relief and that provides a moderate amount of relief.  She has had  Hyalgan in the past and that provided no relief.   ALLERGIES:  No known  drug allergies.   CURRENT MEDICATIONS:  (She is unsure of the dosages but will bring  verification of these to the hospital for her preadmission visit).  1. Toprol 100 mg one tablet p.o. q.a.m.  2. Lotensin 20 mg one tablet p.o. q.p.m.  3. Hydroxyzine 25 mg one tablet p.o. b.i.d.  4. Diclofenac 75 mg one tablet p.o. b.i.d.  5. Imdur 60 mg one tablet p.o. q.d. p.r.n.  6. Aspirin 81 mg one tablet p.o. q.d.  7. Prevacid 30 mg one tablet p.o. q.d.  8. Premarin 0.625 mg one tablet p.o. q.d.  9. Lasix 20 mg one tablet p.o. q.d. p.r.n. swelling.  10.      Multivitamin one  tablet p.o. q.d.  11.      Calcium 600 mg one tablet p.o. b.i.d.  12.      Metamucil one packet p.o. q.d.  She might need it b.i.d. while in     the hospital.  13.      She takes some combination of either Synthroid and/or Levothroid     and she is unsure of the doses.   PAST MEDICAL HISTORY:  1. Hypertension.  2. Hypothyroidism.  3. Hiatal hernia and gastroesophageal reflux disease.  4. Coronary artery disease with stents placed in July 2003.  She is     scheduled for a stress test on January 29, 2002, by Dr. Charlies Constable.  5. Left bundle branch block.  6. History of congestive heart failure eight years ago.  7. Mild diverticulosis via endoscopy.  8. Problems with prolapsed bladder.  9. She denies any history of diabetes mellitus, peptic ulcer disease, asthma     or any other chronic medical condition other than noted previously.   PAST SURGICAL HISTORY:  1. Appendectomy in 1937.  2. Tonsillectomy in 1955.  3. Hysterectomy in 1965 by Dr. Laural Benes.  4. Surgery for prolapsed bladder in 1990 by Dr. Patsi Sears.  5. Angioplasty by Dr. Charlies Constable in 1995.  6. Esophageal dilation by Dr. Lina Sar in 1998.  7. Left total knee arthroplasty Jun 01, 1997.  8. Right knee arthroscopy August 08, 1999.  9. Removal of bilateral cataracts in 2003.  10.      Cardiac stress test July 2003.   HABITS:  She denies any history of  cigarette smoking, alcohol use or drug  use.   SOCIAL HISTORY:  She is married and lives with her husband in a two story  house with one step into the main entrance. She does have a bedroom she can  use either on the first or second floor but there is an elevator in the  house.  She has four children.  They are all alive.  She is a housewife.  Her medical doctor is Dr. Reuben Likes, and his phone number is [phone].  Her cardiologist is Dr. Charlies Constable with  [facility] and her GI doctor is Dr.  Lina Sar with [facility].   FAMILY HISTORY:  Her mother died at the age of 55 with heart disease and a  history of a pacemaker.  Her father died at the age of 87 with congestive  heart failure.  She has two brothers who are alive, one age 45 with coronary  artery disease and one age 40 with testicular cancer.  She had three  brothers who passed away, one at age 66 with unknown disease, one who was  older with lymph node cancer, and one brother who drowned as a teenager.  She does have two living sisters, one age 23 with osteoarthritis, one age 65  with coronary artery disease.  She had one sister who died at age 12 with  heart disease and one who died at age 73 with coronary artery disease and  emphysema.  Her children are ages 24, 82, 84, and 107 and the 75 year old has  diabetes.   REVIEW OF SYSTEMS:  She does wear partial dentures on the lower jaw line.  She wears glasses for reading.  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  She does have  dyspnea on exertion with stairs.  She had the flu  approximately two weeks  ago but has been recovered from that.  She does have problems with chronic  constipation which she takes clear gelatine in cranberry juice twice a day  to prevent that.  All other systems are negative and noncontributory.   PHYSICAL EXAMINATION:  GENERAL APPEARANCE:  A well-developed, well- nourished, mildly overweight white  female in no acute distress.  She walks  with a limp and the use of a cane.  Antalgic gait.  Mood and affect are  appropriate.  She talks easily with the examiner.  VITAL SIGNS:  Height 5 feet 4 inches, weight 159 pounds, BMI 26-1/2.  Temperature 97 degrees F, pulse 76, respiratory rate 14 and blood pressure  152/74.  HEENT:  Normocephalic and atraumatic without frontal or maxillary sinus test  to palpation.  Conjunctiva pink.  Sclerae are anicteric.  PERRLA.  EOMs  intact.  No visible external ear deformities.  Hearing grossly intact.  Tympanic membranes pearly gray bilaterally with good light reflex.  Nose and  nasal septum midline.  Nasal mucosa pink and moist without exudates or  polyps noted.  Buckle mucosa pink and moist.  Good dentition.  Pharynx  without erythema or exudate.  Tongue and uvula midline.  Tongue without  fasciculations and uvula rises equally with phonation.  NECK:  No visible masses or lesions noted.  Trachea midline.  No palpable  lymphadenopathy nor thyromegaly.  Carotids +2 bilaterally without bruits.  Full range of motion and nontender to palpation along the cervical spine.  RESPIRATORY:  Respirations even and unlabored.  Breath sounds clear to  auscultation bilaterally without rales or wheezes noted.  CARDIOVASCULAR:  Regular rate and rhythm. S1 and S2 present without murmurs,  rubs, or clicks noted.  ABDOMEN:  Rounded abdominal contour.  Bowel sounds present x4 quadrants.  She has a well-healed low midline and a right lower quadrant transverse  incision line.  No erythema or ecchymosis.  No pain with palpation. No  hepatosplenomegaly.  No CVA tenderness.  Femoral pulses are +2 bilaterally.  Nontender to palpation along the entire length of the vertebral column.  BREASTS/GU/RECTAL/PELVIC:  Examinations deferred at this time.  MUSCULOSKELETAL:  No obvious deformities bilateral upper extremities with  full range of motion of these extremities without pain.   Radial pulses +2  bilaterally.  She has full range of motion of her hips, ankles, and toes  bilaterally.  DP and PT pulses are +2.  No lower extremity edema.  No pain  with palpation of the calf bilaterally.  Negative Homan's bilaterally.  Left  knee has a well-healed midline incision line.  She has full extension and  flexion to 120 degrees without crepitus.  No pain with palpation along the  joint line.  She does open a little bit medially with valgus stress to the  knee but she has a negative anterior drawer and no pain or effusion about  the knee.  Left knee is lacking probably 10 degrees of full extension.  There is flexion to 120 degrees.  There is a moderate amount of crepitus  with range of motion of the knee.  She does have some blue spider veins  noted about the medial aspect of the knee.  She does complain of pain with  palpation along the medial joint line, none really laterally.  Minimal effusion in the knee. Stable to varus and valgus stress and negative  anterior drawer.  NEUROLOGIC:  Alert and oriented x3.  Cranial nerves II-XII are grossly  intact.  Strength 5/5 bilateral upper and lower extremities.  Deep tendon  reflexes 2+ bilateral upper and lower extremities.  Sensation intact to  light touch.  Rapid alternating movements intact.   RADIOLOGIC FINDINGS:  X-rays taken of her right knee shows total collapse of  the medial compartment with bone on bone articulation  in this compartment.  There were marginal osteophytes noted.   IMPRESSION:  1. End-stage osteoarthritis medial compartment right knee, status post left     knee replacement May of 1999.  2. Coronary artery disease with history of stent July 2003, scheduled for     stress test January 29, 2002.  3. History of congestive heart failure.  4. Hypertension.  5. Hypothyroidism.  6. Hiatal hernia with gastroesophageal reflux disease.  7. History of prolapsed bladder.  8. Chronic constipation.   PLAN:  The  patient will be admitted to [facility] on  February 03, 2002, where she will undergo a right total knee arthroplasty by  Dr. Rinaldo Ratel. She will have the stress test prior to the surgery and  will be cleared by Dr. Juanda Chance prior to undergoing surgery.  If we have any  medical issues while she is hospitalized, we will consult [facility].      Legrand Pitts Duffy, P.A.                      Rodney A. Chaney Malling, M.D.   KED/MEDQ  D:  01/28/2002  T:  01/28/2002  Job:  [id number]

## 2010-05-26 NOTE — Letter (Signed)
March 04, 2008    W. Delia Chimes, MD  [address] [address]  [place], [place] [address]   RE:  Tracy, [name]  MRN:  [id number]  /  DOB:  04-Aug-1921   [name] has coronary heart disease with previous myocardial  infarctions and has an implanted ICD and pacemaker and has compensated  heart failure.  Her cardiac condition has been stable, and I think her  risks for surgery and general anesthesia are not greatly increased.  I  understand that she has plans for cosmetic surgery with you and she  discussed this with me earlier, and we decided this was okay for her to  proceed with this.   She has an ICD in place and this will need to be turned off prior to the  procedure, since cautery will be used during the procedure.  Once you  have a date for this surgery, you can contact the ICD representative  with [employer].  This is Bonna Gains, and he can be reached at  [phone].  He or one of his team can arrange to turn off the  defibrillator prior to surgery, and they will also need to turn it back  on after surgery.   Let me know if you have any questions.    Sincerely,      Bruce R. Juanda Chance, MD, [facility]  Electronically Signed    BRB/MedQ  DD: 03/04/2008  DT: 03/05/2008  Job #: [id number]

## 2010-05-26 NOTE — Discharge Summary (Signed)
NAME:  [name], [name]                       ACCOUNT NO.:  [id number]   MEDICAL RECORD NO.:  [id number]                   PATIENT TYPE:  INP   LOCATION:  5035                                 FACILITY:  [facility]   PHYSICIAN:  Rodney A. Chaney Malling, M.D.           DATE OF BIRTH:  [birth year]   DATE OF ADMISSION:  02/03/2002  DATE OF DISCHARGE:  02/06/2002                                 DISCHARGE SUMMARY   ADMISSION DIAGNOSES:  1. End-stage osteoarthritis, medial compartment of right knee; status post     left knee replacement in May 1999.  2. Coronary artery disease with history of stent.  3. Hypertension.  4. Hypothyroidism.  5. Hiatal hernia with gastroesophageal reflux disease.  6. Chronic constipation.  7. History of congestive heart failure.  8. History of prolapsed bladder.   DISCHARGE DIAGNOSES:  1. End-stage osteoarthritis, medial compartment of right knee, status post     right knee replacement now and left knee replacement in May of 1999.  2. Acute blood loss anemia secondary to surgery.  3. Mild transient hyponatremia.  4. Constipation.  5. Coronary artery disease with history of stent.  6. Hypertension.  7. Hypothyroidism.  8. Chronic constipation.  9. Hiatal hernia with gastroesophageal reflux disease.  10.      History of congestive heart failure.  11.      History of prolapsed bladder.   SURGICAL PROCEDURE:  On February 03, 2002, the patient underwent a right  total knee arthroplasty by Dr. Lenard Galloway. Mortenson, assisted by Legrand Pitts  Duffy, P.A.C.  She had an LCS complete size standard 15-mm insert with an  LCS complete primary femoral component, cemented standard right, and an LCS  complete metal-backed patella, cemented, size standard, Depuy MBT keeled  tibial tray, cemented, size 3, and Smart-Set bone cement.   COMPLICATIONS:  None.   CONSULTS:  1. Rehab Medicine and physical therapy consult, February 04, 2002.  2. Case Management consult, February 05, 2002.  3. Occupational therapy consult, February 06, 2002.   HISTORY OF PRESENT ILLNESS:  This 77 year old white female patient presented  to Dr. Chaney Malling with a history of a left knee replacement in May of 1999.  She did well with the left knee, but the right knee has been bothering her  now for four to five years.  Pain is present with walking and it is an  aching sensation over the medial joint line without radiation.  Pain  increases with walking and decreases with sitting and the use of cortisone.  The knee pops, grinds and gives way at times.  X-rays have shown arthritic  changes in the knee and she has failed conservative treatment and because of  that, she is presenting for a right knee replacement.   HOSPITAL COURSE:  The patient tolerated her surgical procedure well without  immediate postoperative complications.  She was transferred to 5000.  Postop  day 1, hemoglobin was  9.9, hematocrit 29.3.  She had some mild hyponatremia  which was monitored and adjusted and supplemented with her IV fluids.  She  was complaining of some right calf pain and that was monitored.  On postop  day 2, her sodium had improved to 132, hemoglobin 8.7, hematocrit 25.3.  She  was switched to p.o. pain medications and a Doppler was obtained to rule out  a DVT, which was negative.  She was continued on therapy per protocol.  She  made good progress and on February 06, 2002, was doing well.  She did have a  bowel movement with the use of a laxative and she had some mild leukocytosis  with a white count of 10.8.  Chest x-ray showed no infiltrate and after her  bowel movement, she was able to be discharged home.   DISCHARGE INSTRUCTIONS:   DIET:  She can resume her previous pre-hospitalization diet.   MEDICATIONS:  She can resume her pre-hospitalization medications except for  her aspirin and Voltaren.  She can resume the aspirin after she is finished  with Arixtra.  Home medications included:  1.  Toprol 100 mg p.o. q.a.m.  2. Lotensin 20 mg p.o. q.p.m.  3. Hydroxyzine 25 mg p.o. b.i.d.  4. Imdur 60 mg p.o. daily p.r.n.  5. Prevacid 30 mg p.o. daily.  6. Premarin 0.625 mg p.o. daily.  7. Lasix 20 mg p.o. daily p.r.n.  8. Multivitamin p.o. daily.  9. Calcium 600 mg p.o. b.i.d.  10.      Metamucil one packet p.o. daily.  11.      Hypothyroid medicine.   Additional medications include:  1. Arixtra 25 mg subcu daily at 8 p.m., with the last dose February 09, 2002.  2. Percocet 5/325 mg one to two p.o. q.4-6h. p.r.n. for pain.  3. OxyContin 10 mg one tablet p.o. q.12h. for pain.   ACTIVITY:  She is to be out of bed partial weightbearing 50% or less on the  right leg with the use of the walker.  She is arranged for home health PT  per [facility].   WOUND CARE:  She needs to keep her right knee incision clean and dry and may  shower after no drainage from the wound for two days.  She is to notify Dr.  Chaney Malling of temperature greater than or equal to 101.5 degrees Fahrenheit,  chills, pain unrelieved by pain medications or foul-smelling drainage from  the wound.  She is arranged for a home CPM machine 0 to 90 degrees 6 to 8  hours a day.   FOLLOWUP:  She is to follow up to see Dr. Chaney Malling on Wednesday, February 11, 2002, and needs to call [phone] for that appointment.   LABORATORY DATA:  Lower extremity venous Doppler done February 05, 2002  showed no evidence of DVT, superficial thrombosis or Baker's cyst  bilaterally.   Chest x-ray on January 30, 2002 showed no acute abnormality.   On February 04, 2002, hemoglobin 9.9, hematocrit 29.3.  On the 29th,  hemoglobin 8.7, hematocrit 25.3, and on February 06, 2002, white count 10.8,  hemoglobin 9.2, hematocrit 27.4 and platelets 205,000.   On February 04, 2002, sodium 129, potassium 3.7, glucose 160, calcium 8.1. On the 29th, sodium 132, potassium 3.9, glucose 155, and on February 06, 2002,  sodium 134, potassium 3.5  and glucose 154.  All other laboratory studies  were within normal limits.     Legrand Pitts Duffy, P.A.  Rodney A. Chaney Malling, M.D.    KED/MEDQ  D:  03/26/2002  T:  03/28/2002  Job:  [id number]   cc:   Reuben Likes, M.D.  [address]  [place]  [place] [address]  Fax: [phone]   Charlies Constable, M.D. [facility]

## 2010-05-26 NOTE — H&P (Signed)
NAME:  [name], [name]                       ACCOUNT NO.:  [id number]   MEDICAL RECORD NO.:  [id number]                   PATIENT TYPE:  AMB   LOCATION:  ENDO                                 FACILITY:  [facility]   PHYSICIAN:  Lina Sar, M.D. [facility]               DATE OF BIRTH:  07-02-1921   DATE OF ADMISSION:  11/04/2002  DATE OF DISCHARGE:                                HISTORY & PHYSICAL   PROCEDURE:  Upper endoscopy with esophageal dilation and colonoscopy.   INDICATION:  This 77 year old white female has history of benign distal  esophageal stricture.  She has had recurrent solid food dysphagia and  symptoms to gastroesophageal reflux.  She was given samples of Prevacid on  the last visit and was scheduled for upper endoscopy with esophageal  dilatation.  She also is due for repeat colonoscopy.  The patient has known  symptomatic diverticulosis and was due for a repeat colonoscopy last year  but because of cardiac evaluation, the procedure was postponed.  She has had  mild chronic constipation.  Stool has been Hemoccult negative.  The patient  was started on Metamucil.   ENDOSCOPE:  Olympus single-channel video scope.   SEDATION:  1. Versed 5 mg IV.  2. Fentanyl 50 mcg IV.   FINDINGS:  Olympus single channel video scope passed over chin, into the  posterior pharynx, into esophagus.  The patient was monitored by pulse  oximetry.  Her oxygen saturations were normal.  Proximal and mid esophageal  mucosa was unremarkable.  There was a mild fibrotic esophageal stricture at  the GE junction which was nonobstructing, and the endoscope traversed  without resistance.  There was no bleeding from the stricture.   STOMACH:  The stomach was insufflated with air and showed small sliding  hiatal hernia, normal gastric mucosa, normal rugal pattern, and pyloric  outlet.  Retroflexion of endoscope revealed normal fundus and cardia.   DUODENUM:  Duodenal bulb and descending duodenum was  normal.  Endoscope was  then brought back into the stomach; guidewire was placed through the  endoscope.  Endoscope was retracted and Savary dilators passed over the  guidewire without fluoroscopic guidance, using 15, 16, and 17 mm Savary  dilators.  There was no blood on any of the dilators.  The patient tolerated  the procedure well.   IMPRESSION:  1. Benign distal esophageal stricture causing dysphagia, status post     dilation to [address] [place].  2. Chronic gastroesophageal reflux.   PLAN:  1. The patient is to continue antireflux measures.  2. Prevacid 30 mg daily.  3. Postdilatation orders.   COLONOSCOPY:   ENDOSCOPE:  Olympus single-channel video scope.   SEDATION:  Addition of Versed 5 mg IV and fentanyl 50 mcg IV.   FINDINGS:  Olympus single-channel video endoscope was passed through the  anus to the rectosigmoid colon.  The patient was again monitored by pulse  oximetry.  Her oxygen saturations were satisfactory.  Her prep was  excellent.  Anal canal and rectal ampulla was normal.  There were small  internal hemorrhoids which did not show any stigmata of bleeding.  The  sigmoid colon was very tortuous and narrow with large, deep diverticula.  It  was very difficult to negotiate through this narrow segment of the sigmoid  colon.  The patient had a little discomfort during the procedure.  Procedure  was almost unable to continue because of the narrowing of the lumen of the  sigmoid colon, but we were able to advance through with some difficulty.  The mucosa of the descending colon and splenic flexure was normal.  Scattered diverticula were extending through the transverse colon to the  right colon.  Cecal pouch was reached without difficulty and showed normal  ileocecal valve and cecal pouch.  Colonoscope was then retracted after video  photographs were obtained.  A small polyp was seen at 20 cm from the rectal  os on the way out which was pedunculated, thick, white  discoloration.  It  was snared without difficulty and sent to pathology.  The patient tolerated  the procedure well.   PLAN:  1. Postpolypectomy instructions which included avoidance of aspirin for two     weeks or any anticoagulant.  2. Await results from pathology.  3. Continue on Metamucil 1 tbs. daily and stool softener as well.  The     patient has a partial obstruction of the sigmoid colon and may be in     future a candidate for sigmoid resection if her diverticulosis becomes     more bothersome to her or if she develops diverticulitis.  In view of her     age of 61 and a history of heart disease, it will be preferable to treat     her conservatively.                                               Lina Sar, M.D. [facility]    DB/MEDQ  D:  11/04/2002  T:  11/04/2002  Job:  [id number]

## 2010-05-26 NOTE — Discharge Summary (Signed)
[name], [name]             ACCOUNT NO.:  [id number]   MEDICAL RECORD NO.:  [id number]          PATIENT TYPE:  INP   LOCATION:  3734                         FACILITY:  [facility]   PHYSICIAN:  Doylene Canning. Ladona Ridgel, M.D.  DATE OF BIRTH:  26-Aug-1921   DATE OF ADMISSION:  10/13/2003  DATE OF DISCHARGE:                                 DISCHARGE SUMMARY   ADDENDUM:  This addendum concerns the laboratory studies that were done here  at [facility].  BNP this admission was 407.7.  The CK studies; after  catheterization CK was 29, CK-MB was 0.8, HGB A1c was 6.1, TSH was 0.335.       GM/MEDQ  D:  10/15/2003  T:  10/16/2003  Job:  [id number]

## 2010-05-26 NOTE — Assessment & Plan Note (Signed)
[facility]                            CARDIOLOGY OFFICE NOTE   NAME:[name]                    MRN:          [id number]  DATE:02/14/2006                            DOB:          02/26/1921    PRIMARY CARE PHYSICIAN:  Dr. Reuben Likes.   CLINICAL HISTORY:  [name] is 77 years old and had previous anterior  wall infarction and has had previous stenting of the right and  circumflex arteries.  [name] has an ischemic cardiomyopathy and has had an  ICD BiV pacer in place.  Her ejection fraction improved from 25% - 30%  to 35% - 40% by her last echo.   [name] says [name] has been doing quite well.  [name] has had no chest pain,  shortness of breath, or palpitations.  Planning a trip to [facility] soon.   PAST MEDICAL HISTORY:  Significant for hypertension, hyperlipidemia, and  GERD.   CURRENT MEDICATIONS:  Levothyroxine, benazepril, diclofenac,  simvastatin, Coreg, Plavix, furosemide, aspirin, and Prilosec.   EXAMINATION:  Blood pressure is 147/71, pulse 78 and regular.  There was  no venous distention.  Carotid pulses were full without bruits.  CHEST:  Clear without rales or rhonchi.  CARDIAC:  Rhythm was regular.  There was no murmurs or gallops.  ABDOMEN:  Soft with normal bowel sounds.  Peripheral pulses were full, there was no peripheral edema.   IMPRESSION:  1. Coronary artery disease.  Status post prior anterior wall      myocardial infarction and status post stenting of the circumflex      and right coronary artery with drug-eluting stents.  2. Ischemic cardiomyopathy, ejection fraction of 25% - 30% improved to      35% - 40% following biventricular pacing.  3. Status post implantable cardioverter defibrillator biventricular      pacer with good pacer function.  4. Hypertension.  5. Hyperlipidemia.  6. Gastroesophageal reflux disease.  7. Venous insufficiency, lower extremities.   RECOMMENDATIONS:  I think [name] is doing quite  well.  We  interrogated her ICD BiV pacer today and [name] is capturing well in both  chambers and has a good rate response from her  own atrial atrium.  We will plan to get a fasting lipid, liver, CBC,  BNP, Chem 7, and TSH.  I will plan to see her back in 6 months.     Bruce Elvera Lennox Juanda Chance, MD, [facility]  Electronically Signed    BRB/MedQ  DD: 02/14/2006  DT: 02/14/2006  Job #: [id number]

## 2010-05-26 NOTE — Discharge Summary (Signed)
NAMEPINKI, [name]             ACCOUNT NO.:  [id number]   MEDICAL RECORD NO.:  [id number]          PATIENT TYPE:  INP   LOCATION:  4735                         FACILITY:  [facility]   PHYSICIAN:  Doylene Canning. Ladona Ridgel, M.D.  DATE OF BIRTH:  02/26/1921   DATE OF ADMISSION:  12/14/2003  DATE OF DISCHARGE:  12/15/2003                                 DISCHARGE SUMMARY   DISCHARGE DIAGNOSES:  1.  Post procedure day #1 after status post implantation of St. Jude ATLAS +      HF BiV/ICD with defibrillator threshold study less than or equal to 15      joules.  2.  History of coronary artery disease/anterior wall myocardial infarction.  3.  Status post stenting x2 of the right coronary artery in 2003.  4.  Status post stent 80% left circumflex October 30, 2003.  Chronically      occluded left anterior descending.  5.  Ischemic cardiomyopathy, ejection fraction 25%.  6.  Class III congestive heart failure, continued symptoms even after latest      stent/revascularization.  7.  Left bundle branch block.   SECONDARY DIAGNOSES:  1.  Gastroesophageal reflux disease.  2.  Hypertension.  3.  Dyslipidemia.  4.  Degenerative joint disease.   PROCEDURE:  December 14, 2003, implantation of St. Jude BiV ICD successfully  accomplished by Dr. Doylene Canning. Ladona Ridgel with no complications post procedure.   DISPOSITION:  The patient is ready for discharge post procedure day #1 after  implantation of St. Jude BiV ICD.  The patient has had her post procedure  antibiotics.  The incision was looking well.  There was no swelling,  ecchymosis or drainage.  The pain is controlled with oral analgesics so the  patient is using Tylenol currently.  The patient is A sensing and V pacing.  Chest x-ray has been examined and shows that the leads are appropriate and  there is no pneumothorax.  Mobility issues have been discussed with the  patient.  She goes home on the following medication.   DISCHARGE MEDICATIONS:  1.   Hydroxyzine 25 mg three tablets at bedtime.  2.  Prevacid 30 mg daily.  3.  Diclofenac 75 mg twice daily.  4.  Multivitamin daily.  5.  Lasix 40 mg daily.  6.  K-Dur 20 mEq twice daily.  7.  External  carotid artery 325 mg daily.  8.  Plavix 75 mg daily.  9.  Coreg 12.5 mg tablets one and one half tablets twice daily.  10. Levothyroxine 150 mcg daily.  11. Benazepril 10 mg daily.  12. For pain, the patient is taking Tylenol 325 mg one to two tablets every      four to six hours as needed for pain.   Mobility has been discussed with the patient.   DIET:  Low sodium, low cholesterol diet.   The patient is advised to keep the incision dry for the next week.  She may  sponge bathe until Tuesday, December 21, 2003, and then she may recommence  tub bath or showering.   FOLLOW UP:  1.  [facility], [place], [place], office to see Dr. Juanda Chance      Wednesday, December 22, 2003, at 10:15 in the morning.  2.  She will present to the ICD clinic Monday, December 27, 2003, at 9:30 in      the morning.  3.  She will see Dr. Ladona Ridgel March 22, 2004, at 11:40 in the morning.   BRIEF HISTORY:  [name] is an 77 year old patient.  She sees Dr. Juanda Chance  as her cardiologist and she see Dr. Leslee Home as her primary care  physician.  She has a known history of coronary artery disease and is status  post anterior myocardial infarction.  One month ago she had stenting of a  distal 80% left circumflex lesion.  The patient still presents with  significant congestive heart failure symptoms.  She has longstanding left  ventricular dysfunction with ejection fraction 25% by catheterization in  October.  She has left bundle branch block.  Her heart failure is presently  class III.  The patient states that she has mild to moderate improvement in  her symptoms after angioplasty and stenting.  She has trouble with routine  daily activities like walking or doing housework.  She denies  peripheral  edema.  Treatment options have been discussed and implantation of  biventricular cardioverter defibrillator is reasonable and the patient meets  the criteria for this.  We will schedule this on an elective basis  approximately eight to 10 weeks after intervention.   HOSPITAL COURSE:  The patient presented December 14, 2003, for elective  implantation of biventricular cardioverter defibrillator.  This was done  successfully without complications as dictated above.  The patient  discharging post procedure day #1.       GM/MEDQ  D:  12/15/2003  T:  12/15/2003  Job:  [id number]   cc:   Reuben Likes, M.D.  [address]  [place]  [place] [address]  Fax: [phone]

## 2010-06-15 ENCOUNTER — Encounter: Payer: Self-pay | Admitting: *Deleted

## 2010-07-25 ENCOUNTER — Ambulatory Visit (INDEPENDENT_AMBULATORY_CARE_PROVIDER_SITE_OTHER): Payer: Medicare Other | Admitting: Internal Medicine

## 2010-07-25 ENCOUNTER — Encounter: Payer: Self-pay | Admitting: Internal Medicine

## 2010-07-25 DIAGNOSIS — T82198A Other mechanical complication of other cardiac electronic device, initial encounter: Secondary | ICD-10-CM | POA: Insufficient documentation

## 2010-07-25 DIAGNOSIS — M79606 Pain in leg, unspecified: Secondary | ICD-10-CM

## 2010-07-25 DIAGNOSIS — Z950 Presence of cardiac pacemaker: Secondary | ICD-10-CM | POA: Insufficient documentation

## 2010-07-25 DIAGNOSIS — R079 Chest pain, unspecified: Secondary | ICD-10-CM

## 2010-07-25 DIAGNOSIS — I2589 Other forms of chronic ischemic heart disease: Secondary | ICD-10-CM

## 2010-07-25 DIAGNOSIS — M79609 Pain in unspecified limb: Secondary | ICD-10-CM

## 2010-07-25 DIAGNOSIS — I4581 Long QT syndrome: Secondary | ICD-10-CM

## 2010-07-25 DIAGNOSIS — I5022 Chronic systolic (congestive) heart failure: Secondary | ICD-10-CM

## 2010-07-25 DIAGNOSIS — Z95810 Presence of automatic (implantable) cardiac defibrillator: Secondary | ICD-10-CM

## 2010-07-25 DIAGNOSIS — R42 Dizziness and giddiness: Secondary | ICD-10-CM | POA: Insufficient documentation

## 2010-07-25 DIAGNOSIS — I472 Ventricular tachycardia: Secondary | ICD-10-CM

## 2010-07-25 LAB — ICD DEVICE OBSERVATION
AL AMPLITUDE: 4.2 mv
AL IMPEDENCE ICD: 387.5 Ohm
ATRIAL PACING ICD: 0.74 pct
BAMS-0001: 150 {beats}/min
CHARGE TIME: 8.6 s
DEVICE MODEL ICD: 815822
LV LEAD IMPEDENCE ICD: 525 Ohm
LV LEAD THRESHOLD: 1 V
MODE SWITCH EPISODES: 56
RV LEAD THRESHOLD: 1.125 V
TOT-0006: 20111109000000
TOT-0007: 0
TOT-0008: 0
TZAT-0001SLOWVT: 1
TZAT-0004FASTVT: 8
TZAT-0013FASTVT: 1
TZAT-0018FASTVT: NEGATIVE
TZAT-0020SLOWVT: 1 ms
TZON-0003FASTVT: 300 ms
TZON-0005SLOWVT: 6
TZST-0001FASTVT: 2
TZST-0001FASTVT: 3
TZST-0001FASTVT: 5
TZST-0001SLOWVT: 2
TZST-0001SLOWVT: 4
TZST-0003FASTVT: 36 J
TZST-0003FASTVT: 40 J
TZST-0003FASTVT: 40 J
TZST-0003SLOWVT: 15 J
TZST-0003SLOWVT: 30 J
TZST-0003SLOWVT: 40 J
VENTRICULAR PACING ICD: 95 pct
VF: 0

## 2010-07-25 NOTE — Assessment & Plan Note (Signed)
Stable on current medications 

## 2010-07-25 NOTE — Assessment & Plan Note (Addendum)
Episodes of chest pain seem related to Be related to eating. I suspect that they are GI in origin. She is advised to let us know if they occur with exertion

## 2010-07-25 NOTE — Progress Notes (Signed)
HPI  [name] is a 77 y.o. female  seen in followup for management of CAD and CHF. She has had multiple PCI procedures. She has an ischemic cardiomyopathy with an ejection fraction of 25-30% and improved to 45% after CRT.-D SThe last echocardiogram was in May of 2008 at which time her ejection fraction was 45% and she then underwent Myoview scan he last fall demonstrated normalization of left ventricular function.  She underwent ICD generator replacement in November 2011;  She has an implanted Riata lead 1500 series And a QuickSite LV lead  She's had 2 episodes of chest pain. These have both been precipitated by eating and pills getting stuck. She has had problems with esophageal stricture. She has had no exertional chest discomfort.  She's also had episodes of transient weakness. These last minutes. They're unassociated with chest pain or shortness of breath.  Past Medical History  Diagnosis Date  . Chest pain 12/09    recent hospitalization for it. felt to be noncardiac.   [name] CAD (coronary artery disease)     s/p prior anterior wall myocardial infarction and multiple percutaneous coronary intervention.  . Ischemic cardiomyopathy     EF 25-30% improved to 45% with biventricular pacing.   . Systolic heart failure     improved to class I to II   . Automatic implantable cardioverter/defibrillator (AICD) activation     s/p implantable cardioverter-defibrillator bivenrticular pacer. 12/05. St, Jude Medtronic. remote-no.  [name] HTN (hypertension)   . Hyperlipidemia   . GERD (gastroesophageal reflux disease)   . Esophageal stricture     last dialated 2007  . Hiatal hernia   . DD (diverticular disease)     Hx of it - severe. Left colon 2004  . Other urinary problems     urinary incontenance  . UTI (lower urinary tract infection)     recurrent. (Klebsiella pneumoniae). Last Cx 02/03/09. (R-ancef, nitrofurantoin, Augmentin, Zyosyn)    Past Surgical History  Procedure Date  .  Angioplasty     stent  . Unspecified area hysterectomy   . Tubal ligation   . Appendectomy     Current Outpatient Prescriptions  Medication Sig Dispense Refill  . aspirin (ASPIR-81) 81 MG EC tablet Take 81 mg by mouth daily.        . cimetidine (TAGAMET) 400 MG tablet Take 400 mg by mouth 2 (two) times daily.        . furosemide (LASIX) 20 MG tablet Take 20 mg by mouth daily.        . isosorbide mononitrate (IMDUR) 30 MG 24 hr tablet Take 30 mg by mouth daily.        . lansoprazole (PREVACID) 30 MG capsule Take 30 mg by mouth daily.        [name] levothyroxine (SYNTHROID, LEVOTHROID) 125 MCG tablet Take 125 mcg by mouth daily.        . metoprolol (TOPROL XL) 50 MG 24 hr tablet Take 50 mg by mouth 2 (two) times daily.        . nitroGLYCERIN (NITROSTAT) 0.4 MG SL tablet Place 0.4 mg under the tongue every 5 (five) minutes as needed. At the onset of chest pain, Up to 3 doses       . pravastatin (PRAVACHOL) 20 MG tablet Take 20 mg by mouth at bedtime.        . Psyllium (METAFIBER) 48.57 % POWD Take by mouth 2 (two) times daily.        . ramipril (ALTACE)  5 MG capsule Take 5 mg by mouth daily.          Allergies  Allergen Reactions  . Sulfonamide Derivatives     REACTION: rash and itching    Review of Systems negative except from HPI and PMH  Physical Exam Well developed and well nourished in no acute distress HENT normal E scleral and icterus clear Neck Supple Defibrillator pocket well healed JVP flat; carotids brisk and full Clear to ausculation Regular rate and rhythm, no murmurs gallops or rub Soft with active bowel sounds No clubbing cyanosis and edema Alert and oriented, grossly normal motor and sensory function Skin Warm and Dry    Assessment and  Plan

## 2010-07-25 NOTE — Assessment & Plan Note (Signed)
Having episodes of leg and back pain. He has diminished peripheral pulses. We'll obtain arterial Dopplers.

## 2010-07-25 NOTE — Patient Instructions (Addendum)
Your physician has requested that you have a lower extremity arterial duplex. This test is an ultrasound of the arteries in the legs. It looks at arterial blood flow in the legs. Allow one hour for Lower Arterial scans. There are no restrictions or special instructions.  Your physician wants you to follow-up in: 1 year. You will receive a reminder letter in the mail two months in advance. If you don't receive a letter, please call our office to schedule the follow-up appointment.  Your physician recommends that you continue on your current medications as directed. Please refer to the Current Medication list given to you today.

## 2010-07-25 NOTE — Assessment & Plan Note (Signed)
The patient's device was interrogated.  The information was reviewed. No changes were made in the programming.    

## 2010-07-25 NOTE — Assessment & Plan Note (Signed)
As above.

## 2010-07-25 NOTE — Assessment & Plan Note (Signed)
Lead function is stable. This was reviewed with the patient.

## 2010-07-25 NOTE — Assessment & Plan Note (Addendum)
Spells of lightheadedness are not apparently arrhythmic as there is no evidence of arrhythmia on the device. She does have episodes of atrial fibrillation. But she has had none in recent months  I asked her to record her blood pressure during these episodes. She has had problems with lightheadedness related to hypotension in the past.

## 2010-08-01 ENCOUNTER — Encounter (INDEPENDENT_AMBULATORY_CARE_PROVIDER_SITE_OTHER): Payer: Medicare Other | Admitting: *Deleted

## 2010-08-01 DIAGNOSIS — I739 Peripheral vascular disease, unspecified: Secondary | ICD-10-CM

## 2010-08-01 DIAGNOSIS — M79606 Pain in leg, unspecified: Secondary | ICD-10-CM

## 2010-08-02 ENCOUNTER — Encounter: Payer: Self-pay | Admitting: Internal Medicine

## 2010-08-14 ENCOUNTER — Ambulatory Visit: Payer: BLUE CROSS/BLUE SHIELD | Admitting: Internal Medicine

## 2010-08-31 ENCOUNTER — Encounter: Payer: Self-pay | Admitting: Internal Medicine

## 2010-09-13 ENCOUNTER — Encounter: Payer: Self-pay | Admitting: Internal Medicine

## 2010-09-13 ENCOUNTER — Ambulatory Visit (INDEPENDENT_AMBULATORY_CARE_PROVIDER_SITE_OTHER): Payer: Medicare Other | Admitting: Internal Medicine

## 2010-09-13 VITALS — BP 132/68 | HR 60 | Ht 63.0 in | Wt 133.0 lb

## 2010-09-13 DIAGNOSIS — K219 Gastro-esophageal reflux disease without esophagitis: Secondary | ICD-10-CM

## 2010-09-13 DIAGNOSIS — R1013 Epigastric pain: Secondary | ICD-10-CM

## 2010-09-13 MED ORDER — ALIGN PO CAPS: 1.0000 | ORAL_CAPSULE | Freq: Every day | ORAL | Status: DC

## 2010-09-13 MED ORDER — SUCRALFATE 1 GM/10ML PO SUSP: 1.0000 g | Freq: Two times a day (BID) | ORAL | Status: AC

## 2010-09-13 NOTE — Patient Instructions (Addendum)
We have sent the following medications to your pharmacy for you to pick up at your convenience: Carafate. We have given you samples of Align. This puts good bacteria back into your intestines. You should take 1 capsule by mouth once daily. If this works well for you, it can be purchased over the counter. CC: Dr Leslee Home

## 2010-09-13 NOTE — Progress Notes (Signed)
[name] 1921-09-25 MRN [id number]    History of Present Illness:  This is an 77 year old white female with episodes of epigastric pain, bloating and gas. She has a hiatal hernia and benign esophageal stricture which has required periodic esophageal dilatations and has reappeared over many years. Her exams were in 2004, 2007 and in November 2010. She is chronically on a proton pump inhibitor; Prevacid 30 mg at bedtime. She at this time denies any dysphagia. She has recently had several episodes of subxiphoid and substernal chest pain. She has also choked on water. She denies nocturnal cough or hoarseness. Her last colonoscopy in 2004 showed severe diverticulosis of the left colon and an 8 mm pedunculated polyp which was removed from the sigmoid colon. She takes Metamucil twice a day. A CT scan of the abdomen and pelvis in 2007 showed a mildly dilated stomach and small bowel with no specific sign of obstruction.   Past Medical History  Diagnosis Date  . Chest pain 12/09    recent hospitalization for it. felt to be noncardiac.   [name] CAD (coronary artery disease)     s/p prior anterior wall myocardial infarction and multiple percutaneous coronary intervention.  . Ischemic cardiomyopathy     EF 25-30% improved to 45% with biventricular pacing.   . Systolic heart failure     improved to class I to II   . Automatic implantable cardioverter/defibrillator (AICD) activation     s/p implantable cardioverter-defibrillator bivenrticular pacer. 12/05. St, Jude Medtronic. remote-no.  [name] HTN (hypertension)   . Hyperlipidemia   . GERD (gastroesophageal reflux disease)   . Esophageal stricture     last dialated 2007  . Hiatal hernia   . DD (diverticular disease)     Hx of it - severe. Left colon 2004  . Other urinary problems     urinary incontenance  . UTI (lower urinary tract infection)     recurrent. (Klebsiella pneumoniae). Last Cx 02/03/09. (R-ancef, nitrofurantoin, Augmentin, Zyosyn)  .  Arthritis   . PVT (paroxysmal ventricular tachycardia)    Past Surgical History  Procedure Date  . Angioplasty     stent  . Unspecified area hysterectomy   . Tubal ligation   . Appendectomy   . Cardiac defibrillator placement     [facility]    reports that she has never smoked. She has never used smokeless tobacco. She reports that she does not drink alcohol or use illicit drugs. family history includes Alcohol abuse in her son; Breast cancer in her daughter; Colon polyps in her sister; Diabetes in her daughter and unspecified family member; Heart disease in her brother, father, mother, and sister; and Prostate cancer in her brother.  There is no history of Colon cancer. Allergies  Allergen Reactions  . Sulfonamide Derivatives     REACTION: rash and itching        Review of Systems: Denies dysphagia. Positive for subxiphoid and epigastric pain.  The remainder of the 10  point ROS is negative except as outlined in H&P   Physical Exam: General appearance  Well developed, in no distress. Eyes- non icteric. HEENT nontraumatic, normocephalic. Mouth no lesions, tongue papillated, no cheilosis. Neck supple without adenopathy, thyroid not enlarged, no carotid bruits, no JVD. Lungs Clear to auscultation bilaterally. Cor normal S1 normal S2, regular rhythm , no murmur,  quiet precordium. Abdomen soft nontender with normal active bowel sounds. No distention. No tympany or bruit. Rectal: Soft Hemoccult negative stool. Extremities no pedal edema. Skin no  lesions. Neurological alert and oriented x 3. Psychological normal mood and affect.   Assessment and Plan:  Problem #1 Episodic epigastric pain related to a hiatal hernia which has been a problem for the past many years. At this time she does not have dysphagia and therefore I would avoid doing an endoscopy due to her age and presence of coronary artery disease. I suspect she has intermittent esophageal spasm or esophageal  dysmotility. We are going to add Carafate slurry 10 cc by mouth twice a day. She will continue antireflux measures. I have given her samples of a probiotic to take for gas. She will continue on Prevacid 30 mg daily and Metamucil day. We will consider a barium swallow and upper GI series if symptoms continue.  Problem #1 History of colon polyps. Due to coronary artery disease, she is not a candidate for colonoscopy.  09/13/2010 Lina Sar

## 2010-10-12 LAB — URINALYSIS, ROUTINE W REFLEX MICROSCOPIC
Bilirubin Urine: NEGATIVE
Hgb urine dipstick: NEGATIVE
Ketones, ur: NEGATIVE mg/dL
Specific Gravity, Urine: 1.01 (ref 1.005–1.030)
pH: 6.5 (ref 5.0–8.0)

## 2010-10-12 LAB — COMPREHENSIVE METABOLIC PANEL
ALT: 13 U/L (ref 0–35)
AST: 21 U/L (ref 0–37)
Alkaline Phosphatase: 91 U/L (ref 39–117)
CO2: 27 mEq/L (ref 19–32)
Calcium: 9.8 mg/dL (ref 8.4–10.5)
Chloride: 104 mEq/L (ref 96–112)
GFR calc Af Amer: >60 mL/min (ref >60)
GFR calc non Af Amer: >60 mL/min (ref >60)
Potassium: 3.9 mEq/L (ref 3.5–5.1)
Sodium: 137 mEq/L (ref 135–145)
Total Bilirubin: 0.6 mg/dL (ref 0.3–1.2)

## 2010-10-12 LAB — MAGNESIUM: Magnesium: 2.1 mg/dL (ref 1.5–2.5)

## 2010-10-12 LAB — POCT CARDIAC MARKERS

## 2010-10-12 LAB — CBC
Platelets: 209 10*3/uL (ref 150–400)
WBC: 10.7 10*3/uL — ABNORMAL HIGH (ref 4.0–10.5)

## 2010-10-12 LAB — URINE CULTURE: Colony Count: >=100000

## 2010-10-12 LAB — TROPONIN I: Troponin I: 0.02 ng/mL (ref 0.00–0.06)

## 2010-10-12 LAB — BASIC METABOLIC PANEL
BUN: 26 mg/dL — ABNORMAL HIGH (ref 6–23)
Creatinine, Ser: 1 mg/dL (ref 0.4–1.2)
GFR calc Af Amer: >60 mL/min (ref >60)
GFR calc non Af Amer: 53 mL/min — ABNORMAL LOW (ref >60)
Potassium: 4.1 mEq/L (ref 3.5–5.1)

## 2010-10-12 LAB — B-NATRIURETIC PEPTIDE (CONVERTED LAB): Pro B Natriuretic peptide (BNP): 193 pg/mL — ABNORMAL HIGH (ref 0.0–100.0)

## 2010-10-12 LAB — LIPASE, BLOOD: Lipase: 17 U/L (ref 11–59)

## 2010-10-12 LAB — CK TOTAL AND CKMB (NOT AT ARMC)
CK, MB: 3.1 ng/mL (ref 0.3–4.0)
Relative Index: INVALID (ref 0.0–2.5)

## 2010-10-12 LAB — APTT: aPTT: 26 seconds (ref 24–37)

## 2010-10-12 LAB — PROTIME-INR: Prothrombin Time: 14 seconds (ref 11.6–15.2)

## 2010-10-26 ENCOUNTER — Encounter: Payer: Medicare Other | Admitting: *Deleted

## 2010-10-30 ENCOUNTER — Encounter: Payer: Self-pay | Admitting: *Deleted

## 2010-11-09 ENCOUNTER — Telehealth: Payer: Self-pay | Admitting: Internal Medicine

## 2010-11-09 NOTE — Telephone Encounter (Signed)
Patient c/o waking x 2 days with pain at device site.  She denies any redness, swelling of the site or fever or chills.  Patient to come in 11/5 @ 12pm with the device clinic.

## 2010-11-09 NOTE — Telephone Encounter (Signed)
Pt calling re the past few weeks having some pain at the device area  or [phone]

## 2010-11-13 ENCOUNTER — Ambulatory Visit (INDEPENDENT_AMBULATORY_CARE_PROVIDER_SITE_OTHER): Payer: Medicare Other | Admitting: *Deleted

## 2010-11-13 DIAGNOSIS — I5032 Chronic diastolic (congestive) heart failure: Secondary | ICD-10-CM

## 2010-11-13 DIAGNOSIS — I2589 Other forms of chronic ischemic heart disease: Secondary | ICD-10-CM

## 2010-11-13 LAB — ICD DEVICE OBSERVATION
AL AMPLITUDE: 4.5 mv
AL THRESHOLD: 0.75 V
HV IMPEDENCE: 40 Ohm
MODE SWITCH EPISODES: 10
PACEART VT: 0
RV LEAD THRESHOLD: 1.25 V
TOT-0006: 20111109000000
TOT-0010: 3
TZAT-0001FASTVT: 1
TZAT-0001SLOWVT: 1
TZAT-0012SLOWVT: 200 ms
TZAT-0019SLOWVT: 7.5 V
TZAT-0020FASTVT: 1 ms
TZAT-0020SLOWVT: 1 ms
TZON-0004SLOWVT: 45
TZON-0005FASTVT: 6
TZON-0005SLOWVT: 6
TZON-0010SLOWVT: 40 ms
TZST-0001FASTVT: 2
TZST-0001FASTVT: 5
TZST-0001SLOWVT: 4
TZST-0003FASTVT: 25 J
TZST-0003FASTVT: 40 J
TZST-0003SLOWVT: 15 J
VENTRICULAR PACING ICD: 97 pct
VF: 0

## 2010-11-13 NOTE — Progress Notes (Signed)
ICD check 

## 2010-12-28 ENCOUNTER — Encounter: Payer: Self-pay | Admitting: Internal Medicine

## 2011-01-10 DIAGNOSIS — M9981 Other biomechanical lesions of cervical region: Secondary | ICD-10-CM | POA: Diagnosis not present

## 2011-01-10 DIAGNOSIS — M5030 Other cervical disc degeneration, unspecified cervical region: Secondary | ICD-10-CM | POA: Diagnosis not present

## 2011-01-15 DIAGNOSIS — M9981 Other biomechanical lesions of cervical region: Secondary | ICD-10-CM | POA: Diagnosis not present

## 2011-01-15 DIAGNOSIS — M5030 Other cervical disc degeneration, unspecified cervical region: Secondary | ICD-10-CM | POA: Diagnosis not present

## 2011-01-22 DIAGNOSIS — M9981 Other biomechanical lesions of cervical region: Secondary | ICD-10-CM | POA: Diagnosis not present

## 2011-01-22 DIAGNOSIS — M5030 Other cervical disc degeneration, unspecified cervical region: Secondary | ICD-10-CM | POA: Diagnosis not present

## 2011-01-24 DIAGNOSIS — M9981 Other biomechanical lesions of cervical region: Secondary | ICD-10-CM | POA: Diagnosis not present

## 2011-01-24 DIAGNOSIS — M5030 Other cervical disc degeneration, unspecified cervical region: Secondary | ICD-10-CM | POA: Diagnosis not present

## 2011-01-29 DIAGNOSIS — M9981 Other biomechanical lesions of cervical region: Secondary | ICD-10-CM | POA: Diagnosis not present

## 2011-01-29 DIAGNOSIS — M5030 Other cervical disc degeneration, unspecified cervical region: Secondary | ICD-10-CM | POA: Diagnosis not present

## 2011-02-01 DIAGNOSIS — M5030 Other cervical disc degeneration, unspecified cervical region: Secondary | ICD-10-CM | POA: Diagnosis not present

## 2011-02-01 DIAGNOSIS — M9981 Other biomechanical lesions of cervical region: Secondary | ICD-10-CM | POA: Diagnosis not present

## 2011-02-05 DIAGNOSIS — M9981 Other biomechanical lesions of cervical region: Secondary | ICD-10-CM | POA: Diagnosis not present

## 2011-02-05 DIAGNOSIS — M5030 Other cervical disc degeneration, unspecified cervical region: Secondary | ICD-10-CM | POA: Diagnosis not present

## 2011-02-06 ENCOUNTER — Ambulatory Visit (INDEPENDENT_AMBULATORY_CARE_PROVIDER_SITE_OTHER): Payer: Medicare Other | Admitting: Family Medicine

## 2011-02-06 ENCOUNTER — Encounter: Payer: Self-pay | Admitting: Family Medicine

## 2011-02-06 DIAGNOSIS — E039 Hypothyroidism, unspecified: Secondary | ICD-10-CM | POA: Diagnosis not present

## 2011-02-06 DIAGNOSIS — I10 Essential (primary) hypertension: Secondary | ICD-10-CM

## 2011-02-06 DIAGNOSIS — E785 Hyperlipidemia, unspecified: Secondary | ICD-10-CM

## 2011-02-06 LAB — CBC WITH DIFFERENTIAL/PLATELET
Basophils Relative: 0.7 % (ref 0.0–3.0)
Eosinophils Relative: 4.2 % (ref 0.0–5.0)
Hemoglobin: 13.2 g/dL (ref 12.0–15.0)
Lymphocytes Relative: 25.3 % (ref 12.0–46.0)
Monocytes Relative: 9.7 % (ref 3.0–12.0)
Neutro Abs: 4.1 10*3/uL (ref 1.4–7.7)
RBC: 4.22 Mil/uL (ref 3.87–5.11)
WBC: 6.8 10*3/uL (ref 4.5–10.5)

## 2011-02-06 LAB — TSH: TSH: 0.28 u[IU]/mL — ABNORMAL LOW (ref 0.35–5.50)

## 2011-02-06 LAB — BASIC METABOLIC PANEL
BUN: 23 mg/dL (ref 6–23)
CO2: 29 mEq/L (ref 19–32)
Chloride: 102 mEq/L (ref 96–112)
Creatinine, Ser: 0.9 mg/dL (ref 0.4–1.2)
Potassium: 3.9 mEq/L (ref 3.5–5.1)

## 2011-02-06 LAB — LIPID PANEL
Cholesterol: 123 mg/dL (ref 0–200)
LDL Cholesterol: 65 mg/dL (ref 0–99)

## 2011-02-06 LAB — HEPATIC FUNCTION PANEL
ALT: 15 U/L (ref 0–35)
AST: 21 U/L (ref 0–37)
Total Protein: 7 g/dL (ref 6.0–8.3)

## 2011-02-06 NOTE — Progress Notes (Signed)
  Subjective:    Patient ID: [name], female    DOB: 1921/03/06, 78 y.o.   MRN: [id number]  HPI New to establish.  Previous MD- Lorenz Coaster.  Cards- Graciela Husbands.  GI- Brodie.  OphthoElmer Picker.  Health Maintenance- no longer paps.  Getting yearly mammogram.  No longer interested in DEXA scans.  HTN- chronic problem, well controlled today.  On Lasix, toprol xl, ramipril.  No CP, SOB, HAs, edema, visual changes.  Hyperlipidemia- chronic problem, on Pravastatin.  Overdue for labs.  Denies abd pain, N/V.  Hypothyroid- chronic problem, on synthroid.  Due for labs.  Denies fatigue, hair loss.   Review of Systems For ROS see HPI     Objective:   Physical Exam  Constitutional: She is oriented to person, place, and time. She appears well-developed and well-nourished. No distress.  HENT:  Head: Normocephalic and atraumatic.  Eyes: Conjunctivae and EOM are normal. Pupils are equal, round, and reactive to light.  Neck: Normal range of motion. Neck supple. No thyromegaly present.  Cardiovascular: Normal rate, regular rhythm, normal heart sounds and intact distal pulses.   No murmur heard. Pulmonary/Chest: Effort normal and breath sounds normal. No respiratory distress.  Abdominal: Soft. She exhibits no distension. There is no tenderness.  Musculoskeletal: She exhibits edema (trace LE edema bilaterally).  Lymphadenopathy:    She has no cervical adenopathy.  Neurological: She is alert and oriented to person, place, and time.  Skin: Skin is warm and dry.  Psychiatric: She has a normal mood and affect. Her behavior is normal.          Assessment & Plan:

## 2011-02-06 NOTE — Assessment & Plan Note (Signed)
Chronic problem.  Maintained on replacement tx.  Overdue for labs.  Check labs.  Adjust meds prn

## 2011-02-06 NOTE — Assessment & Plan Note (Signed)
Chronic problem.  Adequate control today.  Asymptomatic.  No changes. 

## 2011-02-06 NOTE — Assessment & Plan Note (Signed)
Chronic problem.  Tolerating statin w/out difficulty.  Overdue for labs.  Check labs.  Adjust meds prn

## 2011-02-06 NOTE — Patient Instructions (Signed)
Schedule your complete physical at your convenience We'll notify you of your lab results Call with any questions or concerns Welcome!  We're glad to have you!!!

## 2011-02-07 LAB — T3, FREE: T3, Free: 3.8 pg/mL (ref 2.3–4.2)

## 2011-02-07 LAB — T4, FREE: Free T4: 0.99 ng/dL (ref 0.60–1.60)

## 2011-02-12 DIAGNOSIS — M9981 Other biomechanical lesions of cervical region: Secondary | ICD-10-CM | POA: Diagnosis not present

## 2011-02-15 ENCOUNTER — Encounter: Payer: Medicare Other | Admitting: *Deleted

## 2011-02-19 ENCOUNTER — Encounter: Payer: Self-pay | Admitting: *Deleted

## 2011-02-19 DIAGNOSIS — M9981 Other biomechanical lesions of cervical region: Secondary | ICD-10-CM | POA: Diagnosis not present

## 2011-03-05 DIAGNOSIS — M9981 Other biomechanical lesions of cervical region: Secondary | ICD-10-CM | POA: Diagnosis not present

## 2011-03-14 DIAGNOSIS — M9981 Other biomechanical lesions of cervical region: Secondary | ICD-10-CM | POA: Diagnosis not present

## 2011-03-21 DIAGNOSIS — M9981 Other biomechanical lesions of cervical region: Secondary | ICD-10-CM | POA: Diagnosis not present

## 2011-04-02 DIAGNOSIS — M9981 Other biomechanical lesions of cervical region: Secondary | ICD-10-CM | POA: Diagnosis not present

## 2011-04-04 ENCOUNTER — Encounter: Payer: Medicare Other | Admitting: Family Medicine

## 2011-04-09 ENCOUNTER — Encounter: Payer: Medicare Other | Admitting: Family Medicine

## 2011-04-16 ENCOUNTER — Ambulatory Visit (INDEPENDENT_AMBULATORY_CARE_PROVIDER_SITE_OTHER): Payer: Medicare Other | Admitting: Family Medicine

## 2011-04-16 ENCOUNTER — Encounter: Payer: Self-pay | Admitting: Family Medicine

## 2011-04-16 VITALS — BP 128/80 | HR 82 | Temp 98.2°F | Ht 60.0 in | Wt 134.8 lb

## 2011-04-16 DIAGNOSIS — E039 Hypothyroidism, unspecified: Secondary | ICD-10-CM | POA: Diagnosis not present

## 2011-04-16 DIAGNOSIS — R51 Headache: Secondary | ICD-10-CM

## 2011-04-16 DIAGNOSIS — K5901 Slow transit constipation: Secondary | ICD-10-CM

## 2011-04-16 DIAGNOSIS — R413 Other amnesia: Secondary | ICD-10-CM

## 2011-04-16 LAB — TSH: TSH: 0.92 u[IU]/mL (ref 0.35–5.50)

## 2011-04-16 NOTE — Progress Notes (Signed)
  Subjective:    Patient ID: [name], female    DOB: 04-Feb-1921, 78 y.o.   MRN: [id number]  HPI HAs- pt reports 6 months ago had series of 3 HAs.  Reports they were not severe but after the 3rd HA, noted a change in memory.  Is concerned about TIA.  Denies visual changes during time of HA, motor or sensory deficits.  Pt reports she is forgetting things and this is bothersome to her.  Will forget where she placed things.  Able to identify people, not getting lost.  Last HA was 'quite a while ago'.  Constipation- pt reports she has recently had small, 'marble' like stools.  Increased metamucil to TID and this has improved.   Review of Systems For ROS see HPI     Objective:   Physical Exam  Constitutional: She is oriented to person, place, and time. She appears well-developed and well-nourished. No distress.  HENT:  Head: Normocephalic and atraumatic.       TMs normal bilaterally No TTP over sinuses  Eyes: Conjunctivae and EOM are normal. Pupils are equal, round, and reactive to light.  Neck: Normal range of motion. Neck supple. No thyromegaly present.  Cardiovascular: Normal rate, regular rhythm and intact distal pulses.   Murmur heard. Pulmonary/Chest: Effort normal and breath sounds normal. No respiratory distress.  Abdominal: Soft. Bowel sounds are normal. She exhibits no distension. There is no tenderness. There is no rebound and no guarding.  Musculoskeletal: She exhibits no edema.  Lymphadenopathy:    She has no cervical adenopathy.  Neurological: She is alert and oriented to person, place, and time.  Skin: Skin is warm and dry.  Psychiatric: She has a normal mood and affect. Her behavior is normal.          Assessment & Plan:

## 2011-04-16 NOTE — Patient Instructions (Signed)
Follow up in 3 months to recheck cholesterol- don't eat before this appt We'll notify you of your lab results Continue the Metamucil 3x/day for the constipation We'll keep a close eye on your memory but it sounds like the normal aging process Call with any questions or concerns Happy Early [place] Ouch!!!!

## 2011-04-17 ENCOUNTER — Encounter: Payer: Self-pay | Admitting: *Deleted

## 2011-04-26 ENCOUNTER — Ambulatory Visit (INDEPENDENT_AMBULATORY_CARE_PROVIDER_SITE_OTHER): Payer: Medicare Other | Admitting: Family Medicine

## 2011-04-26 ENCOUNTER — Encounter: Payer: Self-pay | Admitting: Family Medicine

## 2011-04-26 VITALS — BP 130/75 | HR 78 | Temp 98.5°F | Ht 60.0 in | Wt 133.0 lb

## 2011-04-26 DIAGNOSIS — M25579 Pain in unspecified ankle and joints of unspecified foot: Secondary | ICD-10-CM

## 2011-04-26 NOTE — Patient Instructions (Signed)
Go to [address] and get your xray- we'll call you with your results (you can go tomorrow if needed) Wear the post-op shoe to protect the foot ICE! Tylenol/ibuprofen as needed for pain Call with any questions or concerns Hang in there!

## 2011-04-26 NOTE — Progress Notes (Signed)
  Subjective:    Patient ID: [name], female    DOB: 1921/07/25, 78 y.o.   MRN: [id number]  HPI Leg swelling- was stepping up on the side walk and fell a little over a week.  Still having some discomfort, particularly in the L leg.  Pain is concentrated over the 2-4th MTP joints on L.  + swelling when wearing shoes.   Review of Systems For ROS see HPI     Objective:   Physical Exam  Vitals reviewed. Constitutional: She appears well-developed and well-nourished. No distress.  Musculoskeletal: She exhibits edema (trace to 1+ edema of ankles bilaterally).       Feet:          Assessment & Plan:

## 2011-04-26 NOTE — Assessment & Plan Note (Signed)
New.  Occurred after fall 10 days ago.  Get xrays.  Supplied post-op shoe.  Ice.  Reviewed supportive care and red flags that should prompt return.  Pt expressed understanding and is in agreement w/ plan.

## 2011-04-27 ENCOUNTER — Telehealth: Payer: Self-pay | Admitting: *Deleted

## 2011-04-27 ENCOUNTER — Ambulatory Visit (INDEPENDENT_AMBULATORY_CARE_PROVIDER_SITE_OTHER)
Admission: RE | Admit: 2011-04-27 | Discharge: 2011-04-27 | Disposition: A | Discharge status: Home / Self Care | Payer: Medicare Other | Source: Ambulatory Visit | Attending: Family Medicine | Admitting: Family Medicine

## 2011-04-27 DIAGNOSIS — M2140 Flat foot [pes planus] (acquired), unspecified foot: Secondary | ICD-10-CM | POA: Diagnosis not present

## 2011-04-27 DIAGNOSIS — M79609 Pain in unspecified limb: Secondary | ICD-10-CM | POA: Diagnosis not present

## 2011-04-27 DIAGNOSIS — M25579 Pain in unspecified ankle and joints of unspecified foot: Secondary | ICD-10-CM | POA: Diagnosis not present

## 2011-04-27 DIAGNOSIS — S92909A Unspecified fracture of unspecified foot, initial encounter for closed fracture: Secondary | ICD-10-CM

## 2011-04-27 DIAGNOSIS — M19079 Primary osteoarthritis, unspecified ankle and foot: Secondary | ICD-10-CM | POA: Diagnosis not present

## 2011-04-27 NOTE — Telephone Encounter (Signed)
Spoke to pt to advise results/instructions. Pt understood. Placed referral for Ortho and advised when we have an apt available someone will call her, pt understood

## 2011-04-29 NOTE — Assessment & Plan Note (Signed)
New.  Not apparent when speaking to pt.  Able to hold conversation very easily.  Pt's minor deficits seem to be normal age related process.  Will continue to follow.

## 2011-04-29 NOTE — Assessment & Plan Note (Signed)
Chronic problem.  Check labs.  Adjust meds prn  

## 2011-04-29 NOTE — Assessment & Plan Note (Signed)
New.  Pt currently asymptomatic.  Has not had HA in 'quite some time'.  Discussed nature of TIAs and by pt's report- this does not seem to have been what happened.  Pt now aware what to look for and will seek immediate medical attention if concerned.

## 2011-04-29 NOTE — Assessment & Plan Note (Signed)
New.  Pt managing w/ metamucil but this may be indicative of low thyroid.  Check labs.  Adjust meds prn.

## 2011-05-02 DIAGNOSIS — M25579 Pain in unspecified ankle and joints of unspecified foot: Secondary | ICD-10-CM | POA: Diagnosis not present

## 2011-05-03 DIAGNOSIS — M9981 Other biomechanical lesions of cervical region: Secondary | ICD-10-CM | POA: Diagnosis not present

## 2011-05-07 DIAGNOSIS — M9981 Other biomechanical lesions of cervical region: Secondary | ICD-10-CM | POA: Diagnosis not present

## 2011-05-14 DIAGNOSIS — M9981 Other biomechanical lesions of cervical region: Secondary | ICD-10-CM | POA: Diagnosis not present

## 2011-05-18 DIAGNOSIS — M25579 Pain in unspecified ankle and joints of unspecified foot: Secondary | ICD-10-CM | POA: Diagnosis not present

## 2011-05-21 DIAGNOSIS — M9981 Other biomechanical lesions of cervical region: Secondary | ICD-10-CM | POA: Diagnosis not present

## 2011-05-24 ENCOUNTER — Encounter: Payer: Self-pay | Admitting: Family Medicine

## 2011-05-24 ENCOUNTER — Ambulatory Visit (INDEPENDENT_AMBULATORY_CARE_PROVIDER_SITE_OTHER): Payer: Medicare Other | Admitting: Family Medicine

## 2011-05-24 VITALS — BP 128/77 | HR 74 | Temp 97.8°F | Ht 60.0 in | Wt 132.6 lb

## 2011-05-24 DIAGNOSIS — R609 Edema, unspecified: Secondary | ICD-10-CM | POA: Insufficient documentation

## 2011-05-24 DIAGNOSIS — R5381 Other malaise: Secondary | ICD-10-CM

## 2011-05-24 DIAGNOSIS — R5383 Other fatigue: Secondary | ICD-10-CM | POA: Diagnosis not present

## 2011-05-24 DIAGNOSIS — J302 Other seasonal allergic rhinitis: Secondary | ICD-10-CM

## 2011-05-24 DIAGNOSIS — J309 Allergic rhinitis, unspecified: Secondary | ICD-10-CM

## 2011-05-24 NOTE — Progress Notes (Signed)
  Subjective:    Patient ID: [name], female    DOB: [birth year], 78 y.o.   MRN: [id number]  HPI Edema- chronic problem, will occur intermittently.  Is taking Lasix.  Has been more pronounced over the last few weeks.  No pain.  Denies increased salt intake but Sunday had [place] food.  sxs have improved today.    Fatigue- pt reports energy level has been 'not too good'.  sxs for the last few weeks.  sxs have improved over the last 2 days.  Reports she has had increased stress over the last year.  Hoarseness/cough- sxs started 'a couple of weeks ago'.  Attributing this to allergies.  Cough is unchanged day to night.     Review of Systems For ROS see HPI     Objective:   Physical Exam  Vitals reviewed. Constitutional: She is oriented to person, place, and time. She appears well-developed and well-nourished. No distress.  HENT:  Head: Normocephalic and atraumatic.  Right Ear: Tympanic membrane normal.  Left Ear: Tympanic membrane normal.  Nose: Mucosal edema and rhinorrhea present. Right sinus exhibits no maxillary sinus tenderness and no frontal sinus tenderness. Left sinus exhibits no maxillary sinus tenderness and no frontal sinus tenderness.  Mouth/Throat: Mucous membranes are normal. Posterior oropharyngeal erythema (w/ PND) present.  Eyes: Conjunctivae and EOM are normal. Pupils are equal, round, and reactive to light.  Neck: Normal range of motion. Neck supple. No thyromegaly present.  Cardiovascular: Normal rate, regular rhythm, normal heart sounds and intact distal pulses.   No murmur heard. Pulmonary/Chest: Effort normal and breath sounds normal. No respiratory distress. She has no wheezes. She has no rales.  Abdominal: Soft. She exhibits no distension. There is no tenderness.  Musculoskeletal: She exhibits no edema (no appreciable LE edema).  Lymphadenopathy:    She has no cervical adenopathy.  Neurological: She is alert and oriented to person, place, and time.    Skin: Skin is warm and dry.  Psychiatric: She has a normal mood and affect. Her behavior is normal.          Assessment & Plan:

## 2011-05-24 NOTE — Patient Instructions (Signed)
Schedule your complete physical for July- don't eat before this appt If you again have fatigue or increased swelling- please call cardiology Make sure you are taking it easy- you are 56!!! Your nasal congestion, post-nasal drip, and hoarseness are all allergy related Enjoy your trip!!!  Be Safe!!!

## 2011-05-27 NOTE — Assessment & Plan Note (Signed)
New.  This has already resolved.  Likely due to pt's recent food choices- reviewed importance of low Na diet.  No crackles on lung exam to suggest fluid overload.  Pt aware that if she again has swelling she needs to contact cards.  Pt expressed understanding and is in agreement w/ plan.

## 2011-05-27 NOTE — Assessment & Plan Note (Signed)
New.  This is cause of pt's laryngitis and cough.  Start OTC antihistamine prn but pt reports she is going to the beach for a prolonged stay which should alone improve allergies.  Pt expressed understanding and is in agreement w/ plan.

## 2011-05-27 NOTE — Assessment & Plan Note (Signed)
New.  Pt's sxs have improved by time of OV.  Pt admits to increased stress and high level of activity.  Pt admits she fails to recognize her age and physical limitations.  Discussed activity modification.  Will follow.

## 2011-07-02 ENCOUNTER — Encounter: Payer: Self-pay | Admitting: Family Medicine

## 2011-07-02 ENCOUNTER — Ambulatory Visit (INDEPENDENT_AMBULATORY_CARE_PROVIDER_SITE_OTHER): Payer: Medicare Other | Admitting: Family Medicine

## 2011-07-02 VITALS — BP 125/74 | HR 76 | Temp 98.2°F | Ht 60.0 in | Wt 131.8 lb

## 2011-07-02 DIAGNOSIS — J3489 Other specified disorders of nose and nasal sinuses: Secondary | ICD-10-CM | POA: Diagnosis not present

## 2011-07-02 MED ORDER — MUPIROCIN CALCIUM 2 % NA OINT: TOPICAL_OINTMENT | Freq: Two times a day (BID) | NASAL | Status: DC

## 2011-07-02 NOTE — Patient Instructions (Addendum)
Use the Bactroban as directed If your symptoms don't improve- please call Have a great summer! Hang in there!

## 2011-07-02 NOTE — Progress Notes (Signed)
  Subjective:    Patient ID: LEINAALA CATANESE, female    DOB: 26-Sep-1921, 78 y.o.   MRN: [id number]  HPI Nasal sores- sxs started 3-4 months ago, redness both internally and externally.  + throbbing.  At one point had 3 areas in nose that looked like ingrown hairs.  Family member w/ similar.  No similar lesions elsewhere on body- only in nose.  Has used neosporin w/out relief.  Areas are somewhat improved today but pt reports they will flare and then improve.   Review of Systems For ROS see HPI     Objective:   Physical Exam  Vitals reviewed. Constitutional: She appears well-developed and well-nourished. No distress.  HENT:  Head: Normocephalic and atraumatic.  Nose: Nose normal.       No sores visible on exam.          Assessment & Plan:

## 2011-07-03 NOTE — Assessment & Plan Note (Signed)
New.  Based on pt's description and report that family member has similar, suspect MRSA.  Start nasal bactroban.  Reviewed supportive care and red flags that should prompt return.  Pt expressed understanding and is in agreement w/ plan.

## 2011-07-16 ENCOUNTER — Other Ambulatory Visit: Payer: Self-pay | Admitting: Family Medicine

## 2011-07-16 MED ORDER — PRAVASTATIN SODIUM 20 MG PO TABS: 20.0000 mg | ORAL_TABLET | Freq: Every day | ORAL | Status: DC

## 2011-07-16 NOTE — Telephone Encounter (Signed)
refill pravastatin 20mg  , no qty/inst./fill date listed  Last instructions Take 20 mg by mouth at bedtime.   last ov 6.24.13

## 2011-07-16 NOTE — Telephone Encounter (Signed)
Refill done.  

## 2011-07-18 ENCOUNTER — Ambulatory Visit: Payer: Medicare Other | Admitting: Family Medicine

## 2011-07-31 ENCOUNTER — Encounter: Payer: Self-pay | Admitting: Internal Medicine

## 2011-07-31 ENCOUNTER — Ambulatory Visit (INDEPENDENT_AMBULATORY_CARE_PROVIDER_SITE_OTHER): Payer: Medicare Other | Admitting: Internal Medicine

## 2011-07-31 VITALS — BP 150/90 | HR 86 | Ht 60.0 in | Wt 131.4 lb

## 2011-07-31 DIAGNOSIS — I5032 Chronic diastolic (congestive) heart failure: Secondary | ICD-10-CM

## 2011-07-31 DIAGNOSIS — I2510 Atherosclerotic heart disease of native coronary artery without angina pectoris: Secondary | ICD-10-CM

## 2011-07-31 DIAGNOSIS — I2589 Other forms of chronic ischemic heart disease: Secondary | ICD-10-CM

## 2011-07-31 DIAGNOSIS — I472 Ventricular tachycardia: Secondary | ICD-10-CM | POA: Diagnosis not present

## 2011-07-31 DIAGNOSIS — Z95810 Presence of automatic (implantable) cardiac defibrillator: Secondary | ICD-10-CM

## 2011-07-31 DIAGNOSIS — R609 Edema, unspecified: Secondary | ICD-10-CM

## 2011-07-31 LAB — ICD DEVICE OBSERVATION
AL IMPEDENCE ICD: 387.5 Ohm
ATRIAL PACING ICD: 0.26 pct
BAMS-0001: 150 {beats}/min
BAMS-0003: 70 {beats}/min
DEVICE MODEL ICD: 815822
FVT: 0
LV LEAD IMPEDENCE ICD: 487.5 Ohm
LV LEAD THRESHOLD: 1.5 V
RV LEAD AMPLITUDE: 8.3 mv
RV LEAD IMPEDENCE ICD: 337.5 Ohm
TOT-0007: 0
TOT-0008: 0
TOT-0009: 0
TZAT-0004FASTVT: 8
TZAT-0004SLOWVT: 8
TZAT-0012FASTVT: 200 ms
TZAT-0013FASTVT: 1
TZAT-0013SLOWVT: 4
TZAT-0018FASTVT: NEGATIVE
TZAT-0019FASTVT: 7.5 V
TZON-0003FASTVT: 300 ms
TZON-0003SLOWVT: 365 ms
TZON-0004FASTVT: 30
TZON-0010FASTVT: 40 ms
TZON-0010SLOWVT: 40 ms
TZST-0001FASTVT: 3
TZST-0001FASTVT: 4
TZST-0001SLOWVT: 2
TZST-0001SLOWVT: 3
TZST-0001SLOWVT: 5
TZST-0003FASTVT: 40 J
TZST-0003SLOWVT: 40 J
VENTRICULAR PACING ICD: 97 pct

## 2011-07-31 NOTE — Assessment & Plan Note (Signed)
Lead stable 

## 2011-07-31 NOTE — Assessment & Plan Note (Signed)
The patient has a mildt amount of edema;  good discussed having her taking her diuretic daily for 4 days and seeing if that doesn't alleviate the problem

## 2011-07-31 NOTE — Assessment & Plan Note (Signed)
No intercurrent Ventricular tachycardia  

## 2011-07-31 NOTE — Progress Notes (Signed)
HPI  [name] is a 78 y.o. female seen in followup for management of CAD and CHF. She has had multiple PCI procedures. She has an ischemic cardiomyopathy with an ejection fraction of 25-30% and improved to 45% after CRT.-D  The last echocardiogram was in May of 2008 at which time her ejection fraction was 45% and she then underwent Myoview scan FALL 2011demonstrated normalization of left ventricular function.  She underwent ICD generator replacement in November 2011; She has an implanted Riata lead 1500 series And a QuickSite LV lead    She has infrequent chest pain associated with exertion of predictable amount. She is getting around quite well. She is anticipating a trip [place]  She has had some problems with peripheral edema; this has been more notable over the last couple of months. She does not add salt. She has not avoided though. She takes a diuretic on an as-needed basis and she takes it at night.  Past Medical History  Diagnosis Date  . Chest pain 12/09    recent hospitalization for it. felt to be noncardiac.   [name] CAD (coronary artery disease)     s/p prior anterior wall myocardial infarction and multiple percutaneous coronary intervention.  . Ischemic cardiomyopathy     EF 25-30% improved to 45% with biventricular pacing.   . Systolic heart failure     improved to class I to II   . Automatic implantable cardioverter/defibrillator (AICD) activation     s/p implantable cardioverter-defibrillator bivenrticular pacer. 12/05. [employer]. remote-no.  [name] HTN (hypertension)   . Hyperlipidemia   . GERD (gastroesophageal reflux disease)   . Esophageal stricture     last dialated 2007  . Hiatal hernia   . DD (diverticular disease)     Hx of it - severe. Left colon 2004  . Other urinary problems     urinary incontenance  . UTI (lower urinary tract infection)     recurrent. (Klebsiella pneumoniae). Last Cx 02/03/09. (R-ancef,  nitrofurantoin, Augmentin, Zyosyn)  . Arthritis   . PVT (paroxysmal ventricular tachycardia)     Past Surgical History  Procedure Date  . Angioplasty     stent  . Unspecified area hysterectomy   . Tubal ligation   . Appendectomy   . Cardiac defibrillator placement     [employer]    Current Outpatient Prescriptions  Medication Sig Dispense Refill  . aspirin (ASPIR-81) 81 MG EC tablet Take 81 mg by mouth daily.        . cimetidine (TAGAMET) 400 MG tablet Take 400 mg by mouth 2 (two) times daily.        . furosemide (LASIX) 20 MG tablet Take 20 mg by mouth daily.        . isosorbide mononitrate (IMDUR) 30 MG 24 hr tablet Take 30 mg by mouth daily.        . lansoprazole (PREVACID) 30 MG capsule Take 30 mg by mouth daily.        [name] levothyroxine (SYNTHROID, LEVOTHROID) 125 MCG tablet Take 125 mcg by mouth daily.        . metoprolol (TOPROL XL) 50 MG 24 hr tablet Take 50 mg by mouth 2 (two) times daily.        . nitroGLYCERIN (NITROSTAT) 0.4 MG SL tablet Place 0.4 mg under the tongue every 5 (five) minutes as needed. At the onset of chest pain, Up to 3 doses       .  pravastatin (PRAVACHOL) 20 MG tablet Take 1 tablet (20 mg total) by mouth at bedtime.  30 tablet  0  . Psyllium (METAFIBER) 48.57 % POWD Take by mouth 3 (three) times daily.       . ramipril (ALTACE) 5 MG capsule Take 5 mg by mouth daily.        . mupirocin nasal ointment (BACTROBAN NASAL) 2 % Place into the nose 2 (two) times daily. for five (5) days. After application, press sides of nose together and gently massage.  10 g  0    Allergies  Allergen Reactions  . Sulfonamide Derivatives     REACTION: rash and itching    Review of Systems negative except from HPI and PMH  Physical Exam BP 150/90  Pulse 86  Ht 5' (1.524 m)  Wt 131 lb 6.4 oz (59.603 kg)  BMI 25.66 kg/m2 Well developed and well nourished in no acute distress HENT normal E scleral and icterus clear Neck Supple JVP flat; carotids brisk and  full Clear to ausculation Regular rate and rhythm, S4 and an early systolic murmur Soft with active bowel sounds No clubbing cyanosis Trace Edema Alert and oriented, grossly normal motor and sensory function Skin Warm and Dry    Assessment and  Plan

## 2011-07-31 NOTE — Assessment & Plan Note (Signed)
The patient's device was interrogated.  The information was reviewed. No changes were made in the programming.    

## 2011-07-31 NOTE — Patient Instructions (Addendum)
Your physician wants you to follow-up in:  YEAR WITH DR KLEIN  You will receive a reminder letter in the mail two months in advance. If you don't receive a letter, please call our office to schedule the follow-up appointment. Your physician recommends that you continue on your current medications as directed. Please refer to the Current Medication list given to you today. 

## 2011-07-31 NOTE — Assessment & Plan Note (Addendum)
With stable chronic angina. Her history is not all that reliable. She doe not sem limited.

## 2011-08-01 ENCOUNTER — Telehealth: Payer: Self-pay | Admitting: Family Medicine

## 2011-08-01 DIAGNOSIS — M9981 Other biomechanical lesions of cervical region: Secondary | ICD-10-CM | POA: Diagnosis not present

## 2011-08-01 MED ORDER — RAMIPRIL 5 MG PO CAPS: 5.0000 mg | ORAL_CAPSULE | Freq: Every day | ORAL | Status: DC

## 2011-08-01 NOTE — Telephone Encounter (Signed)
Refill: Ramipril 5mg  capsule. Take 1 capsule by mouth daily. Qty 30. Last fill 05-22-11

## 2011-08-01 NOTE — Telephone Encounter (Signed)
rx sent to pharmacy by e-script  

## 2011-08-07 ENCOUNTER — Ambulatory Visit (INDEPENDENT_AMBULATORY_CARE_PROVIDER_SITE_OTHER): Payer: Medicare Other | Admitting: Family Medicine

## 2011-08-07 ENCOUNTER — Encounter: Payer: Self-pay | Admitting: *Deleted

## 2011-08-07 ENCOUNTER — Encounter: Payer: Self-pay | Admitting: Family Medicine

## 2011-08-07 VITALS — BP 124/82 | HR 94 | Temp 97.9°F | Ht 60.0 in | Wt 129.8 lb

## 2011-08-07 DIAGNOSIS — Z0000 Encounter for general adult medical examination without abnormal findings: Secondary | ICD-10-CM | POA: Diagnosis not present

## 2011-08-07 DIAGNOSIS — E785 Hyperlipidemia, unspecified: Secondary | ICD-10-CM | POA: Diagnosis not present

## 2011-08-07 DIAGNOSIS — M949 Disorder of cartilage, unspecified: Secondary | ICD-10-CM

## 2011-08-07 DIAGNOSIS — E039 Hypothyroidism, unspecified: Secondary | ICD-10-CM

## 2011-08-07 DIAGNOSIS — M8580 Other specified disorders of bone density and structure, unspecified site: Secondary | ICD-10-CM

## 2011-08-07 DIAGNOSIS — I10 Essential (primary) hypertension: Secondary | ICD-10-CM | POA: Diagnosis not present

## 2011-08-07 DIAGNOSIS — M810 Age-related osteoporosis without current pathological fracture: Secondary | ICD-10-CM | POA: Insufficient documentation

## 2011-08-07 DIAGNOSIS — M9981 Other biomechanical lesions of cervical region: Secondary | ICD-10-CM | POA: Diagnosis not present

## 2011-08-07 DIAGNOSIS — M899 Disorder of bone, unspecified: Secondary | ICD-10-CM | POA: Diagnosis not present

## 2011-08-07 LAB — CBC WITH DIFFERENTIAL/PLATELET
Basophils Absolute: 0 10*3/uL (ref 0.0–0.1)
Eosinophils Absolute: 0.3 10*3/uL (ref 0.0–0.7)
HCT: 39.5 % (ref 36.0–46.0)
Lymphs Abs: 1.6 10*3/uL (ref 0.7–4.0)
MCHC: 33.4 g/dL (ref 30.0–36.0)
MCV: 92 fl (ref 78.0–100.0)
Monocytes Absolute: 0.6 10*3/uL (ref 0.1–1.0)
Platelets: 186 10*3/uL (ref 150.0–400.0)
RDW: 13.6 % (ref 11.5–14.6)

## 2011-08-07 LAB — LIPID PANEL
Cholesterol: 116 mg/dL (ref 0–200)
LDL Cholesterol: 63 mg/dL (ref 0–99)
Total CHOL/HDL Ratio: 3
Triglycerides: 63 mg/dL (ref 0.0–149.0)

## 2011-08-07 LAB — HEPATIC FUNCTION PANEL
ALT: 15 U/L (ref 0–35)
Albumin: 4.1 g/dL (ref 3.5–5.2)
Alkaline Phosphatase: 125 U/L — ABNORMAL HIGH (ref 39–117)
Bilirubin, Direct: 0.1 mg/dL (ref 0.0–0.3)
Total Protein: 6.9 g/dL (ref 6.0–8.3)

## 2011-08-07 LAB — BASIC METABOLIC PANEL
Calcium: 9.9 mg/dL (ref 8.4–10.5)
GFR: 81.2 mL/min (ref >60.00)
Glucose, Bld: 97 mg/dL (ref 70–99)
Sodium: 140 mEq/L (ref 135–145)

## 2011-08-07 MED ORDER — LEVOTHYROXINE SODIUM 125 MCG PO TABS: 125.0000 ug | ORAL_TABLET | Freq: Every day | ORAL | Status: DC

## 2011-08-07 MED ORDER — PRAVASTATIN SODIUM 20 MG PO TABS: 20.0000 mg | ORAL_TABLET | Freq: Every day | ORAL | Status: DC

## 2011-08-07 MED ORDER — FUROSEMIDE 20 MG PO TABS: 20.0000 mg | ORAL_TABLET | Freq: Every day | ORAL | Status: DC

## 2011-08-07 MED ORDER — METOPROLOL SUCCINATE ER 50 MG PO TB24: 50.0000 mg | ORAL_TABLET | Freq: Two times a day (BID) | ORAL | Status: DC

## 2011-08-07 MED ORDER — RAMIPRIL 5 MG PO CAPS: 5.0000 mg | ORAL_CAPSULE | Freq: Every day | ORAL | Status: DC

## 2011-08-07 MED ORDER — ISOSORBIDE MONONITRATE ER 30 MG PO TB24: 30.0000 mg | ORAL_TABLET | Freq: Every day | ORAL | Status: DC

## 2011-08-07 NOTE — Progress Notes (Signed)
  Subjective:    Patient ID: [name], female    DOB: [birth year], 78 y.o.   MRN: [id number]  HPI Here today for CPE.  Risk Factors: HTN- chronic problem.  Was elevated at [facility] earlier this month but normal here today.  Pt reports high stress level that day due to family reasons.  On toprol, imdur, lasix.  No CP, SOB, HAs, visual changes, edema. Hyperlipidemia- chronic problem.  On pravastatin.  Denies abd pain, N/V, myalgias. Hypothyroid- chronic problem, on synthroid.  Denies cold/heat intolerance, dry brittle hair/nails Itching- occuring diffusely and intermittently.  No pattern to the itching.  Pt reports hot showers/baths relieve sxs.  Has seen derm previously and used hydrocortisone cream w/out relief. Physical Activity: exercising regularly Fall Risk: mild-moderate risk, ambulates w/ cane Depression: denies current sxs Hearing: normal to conversational tones, mildly decreased to whispered voice ADL's: independent Cognitive: normal linear thought process, memory and attention intact Home Safety: lives alone, has good support system w/ neighbors Height, Weight, BMI, Visual Acuity: see vitals, vision corrected to 20/20 w/ glasses Counseling: UTD on mammo, DEXA in 2010 w/ osteopenia and pt not interested in repeat, no need for pap or colonoscopy. Labs Ordered: See A&P Care Plan: See A&P    Review of Systems Patient reports no vision/ hearing changes, adenopathy,fever, weight change,  persistant/recurrent hoarseness , swallowing issues, chest pain, palpitations, edema, persistant/recurrent cough, hemoptysis, dyspnea (rest/exertional/paroxysmal nocturnal), gastrointestinal bleeding (melena, rectal bleeding), abdominal pain, significant heartburn, bowel changes, GU symptoms (dysuria, hematuria, incontinence), Gyn symptoms (abnormal  bleeding, pain),  syncope, focal weakness, memory loss, numbness & tingling, skin/hair/nail changes, abnormal bruising or bleeding, anxiety, or  depression.     Objective:   Physical Exam  General Appearance:    Alert, cooperative, no distress, appears stated age  Head:    Normocephalic, without obvious abnormality, atraumatic  Eyes:    PERRL, conjunctiva/corneas clear, EOM's intact, fundi    benign, both eyes  Ears:    Normal TM's and external ear canals, both ears  Nose:   Nares normal, septum midline, mucosa normal, no drainage    or sinus tenderness  Throat:   Lips, mucosa, and tongue normal; teeth and gums normal  Neck:   Supple, symmetrical, trachea midline, no adenopathy;    Thyroid: no enlargement/tenderness/nodules  Back:     Symmetric, no curvature, ROM normal, no CVA tenderness  Lungs:     Clear to auscultation bilaterally, respirations unlabored  Chest Wall:    No tenderness or deformity   Heart:    Regular rate and rhythm, S1 and S2 normal, II-III/VI SEM murmur,  No rub or gallop  Breast Exam:    No tenderness, masses, or nipple abnormality  Abdomen:     Soft, non-tender, bowel sounds active all four quadrants,    no masses, no organomegaly  Genitalia:    deferred  Rectal:    Extremities:   Extremities normal, atraumatic, no cyanosis or edema  Pulses:   2+ and symmetric all extremities  Skin:   Skin color, texture, turgor normal, no rashes or lesions  Lymph nodes:   Cervical, supraclavicular, and axillary nodes normal  Neurologic:   CNII-XII intact, normal strength, sensation and reflexes    throughout          Assessment & Plan:

## 2011-08-07 NOTE — Patient Instructions (Addendum)
Schedule a nurse visit for your depomedrol (steroid shot) injection 1-2 days before you go Follow up in 6 months to recheck BP and cholesterol We'll notify you of your lab results and make any changes if needed Take a benadryl prior to bed for your itching Schedule your mammogram at your convenience Keep up the good work!  You look great! Have a great trip!

## 2011-08-08 DIAGNOSIS — N3020 Other chronic cystitis without hematuria: Secondary | ICD-10-CM | POA: Diagnosis not present

## 2011-08-08 DIAGNOSIS — N952 Postmenopausal atrophic vaginitis: Secondary | ICD-10-CM | POA: Diagnosis not present

## 2011-08-08 DIAGNOSIS — N393 Stress incontinence (female) (male): Secondary | ICD-10-CM | POA: Diagnosis not present

## 2011-08-09 ENCOUNTER — Other Ambulatory Visit: Payer: Self-pay | Admitting: Family Medicine

## 2011-08-09 LAB — VITAMIN D 1,25 DIHYDROXY
Vitamin D2 1, 25 (OH)2: <8 pg/mL
Vitamin D3 1, 25 (OH)2: 45 pg/mL

## 2011-08-09 MED ORDER — LANSOPRAZOLE 30 MG PO CPDR: 30.0000 mg | DELAYED_RELEASE_CAPSULE | Freq: Every day | ORAL | Status: DC

## 2011-08-09 NOTE — Telephone Encounter (Signed)
refill Lansoprazole DR 30 mg Capsule #30 take 1 capsule every day as needed Last fill 6.22.13 Last ov 7.30.13 CPE

## 2011-08-09 NOTE — Telephone Encounter (Signed)
rx sent to pharmacy by e-script  

## 2011-08-10 ENCOUNTER — Encounter: Payer: Self-pay | Admitting: *Deleted

## 2011-08-14 DIAGNOSIS — Z01810 Encounter for preprocedural cardiovascular examination: Secondary | ICD-10-CM | POA: Insufficient documentation

## 2011-08-14 NOTE — Assessment & Plan Note (Signed)
Tolerating statin w/out difficulty.  Check labs.  Adjust meds prn. 

## 2011-08-14 NOTE — Assessment & Plan Note (Signed)
Check Vit D.  Pt declines repeat DEXA

## 2011-08-14 NOTE — Assessment & Plan Note (Signed)
Pt's PE WNL.  UTD on mammo.  No need for pap, colonoscopy at her age.  Prefers not to repeat DEXA.  Check labs.  Anticipatory guidance provided.

## 2011-08-14 NOTE — Assessment & Plan Note (Signed)
Chronic problem.  Adequate control.  Asymptomatic.  No changes. 

## 2011-08-14 NOTE — Assessment & Plan Note (Signed)
Chronic problem.  Check labs to ensure that synthroid dose is correct

## 2011-08-15 DIAGNOSIS — M9981 Other biomechanical lesions of cervical region: Secondary | ICD-10-CM | POA: Diagnosis not present

## 2011-08-20 ENCOUNTER — Ambulatory Visit (INDEPENDENT_AMBULATORY_CARE_PROVIDER_SITE_OTHER): Payer: Medicare Other | Admitting: *Deleted

## 2011-08-20 ENCOUNTER — Telehealth: Payer: Self-pay | Admitting: Family Medicine

## 2011-08-20 DIAGNOSIS — M549 Dorsalgia, unspecified: Secondary | ICD-10-CM | POA: Diagnosis not present

## 2011-08-20 MED ORDER — PRAVASTATIN SODIUM 20 MG PO TABS: 20.0000 mg | ORAL_TABLET | Freq: Every day | ORAL | Status: DC

## 2011-08-20 MED ORDER — METHYLPREDNISOLONE ACETATE 80 MG/ML IJ SUSP
80.0000 mg | Freq: Once | INTRAMUSCULAR | Status: AC
  Administered 2011-08-20: 80 mg via INTRAMUSCULAR

## 2011-08-20 NOTE — Telephone Encounter (Signed)
Refill: pravastatin sodium 20mg  tab. Take 1 tablet by mouth at bedtime. Qty 30. Last fill 07-16-11

## 2011-08-20 NOTE — Telephone Encounter (Signed)
rx sent to pharmacy by e-script  

## 2011-08-31 ENCOUNTER — Other Ambulatory Visit: Payer: Self-pay | Admitting: Family Medicine

## 2011-09-04 DIAGNOSIS — Z1231 Encounter for screening mammogram for malignant neoplasm of breast: Secondary | ICD-10-CM | POA: Diagnosis not present

## 2011-09-14 ENCOUNTER — Telehealth: Payer: Self-pay | Admitting: Family Medicine

## 2011-09-14 NOTE — Telephone Encounter (Signed)
Called pt to advise the prescription has been faxed for the hearing test, left vm to advise the rx has been sent and if any further assistance is needed to please contact the office

## 2011-09-14 NOTE — Telephone Encounter (Signed)
Ok to fax written script for hearing test (use prescription pad)

## 2011-09-14 NOTE — Telephone Encounter (Signed)
Pt called stated she needed an order sent to [facility] for a hearing test, fax# [phone] Pt stated she has lost her hearing aid and insurance will not pay for the test w/o an order

## 2011-09-14 NOTE — Telephone Encounter (Signed)
Please advise 

## 2011-09-17 DIAGNOSIS — H903 Sensorineural hearing loss, bilateral: Secondary | ICD-10-CM | POA: Diagnosis not present

## 2011-10-05 ENCOUNTER — Other Ambulatory Visit: Payer: Self-pay | Admitting: Family Medicine

## 2011-10-05 MED ORDER — RAMIPRIL 5 MG PO CAPS: 5.0000 mg | ORAL_CAPSULE | Freq: Every day | ORAL | Status: DC

## 2011-10-05 NOTE — Telephone Encounter (Signed)
rx sent to pharmacy by e-script  

## 2011-10-05 NOTE — Telephone Encounter (Signed)
RAMIPRIL 5MG  CAPSULE QTY: 30 LAST REFILL:08/31/11 TAKE 1 CAPSULE (5 MG TOTAL) BY MOUTH DAILY

## 2011-10-09 ENCOUNTER — Ambulatory Visit (INDEPENDENT_AMBULATORY_CARE_PROVIDER_SITE_OTHER): Payer: Medicare Other

## 2011-10-09 DIAGNOSIS — Z23 Encounter for immunization: Secondary | ICD-10-CM

## 2011-10-29 DIAGNOSIS — M9981 Other biomechanical lesions of cervical region: Secondary | ICD-10-CM | POA: Diagnosis not present

## 2011-11-05 ENCOUNTER — Encounter: Payer: Medicare Other | Admitting: *Deleted

## 2011-11-06 ENCOUNTER — Encounter: Payer: Self-pay | Admitting: *Deleted

## 2011-11-29 DIAGNOSIS — M9981 Other biomechanical lesions of cervical region: Secondary | ICD-10-CM | POA: Diagnosis not present

## 2011-12-17 ENCOUNTER — Ambulatory Visit (INDEPENDENT_AMBULATORY_CARE_PROVIDER_SITE_OTHER): Payer: Medicare Other | Admitting: Family Medicine

## 2011-12-17 ENCOUNTER — Encounter: Payer: Self-pay | Admitting: Family Medicine

## 2011-12-17 VITALS — BP 130/68 | HR 80 | Temp 97.6°F | Ht <= 58 in | Wt 125.8 lb

## 2011-12-17 DIAGNOSIS — R251 Tremor, unspecified: Secondary | ICD-10-CM | POA: Insufficient documentation

## 2011-12-17 DIAGNOSIS — J329 Chronic sinusitis, unspecified: Secondary | ICD-10-CM

## 2011-12-17 DIAGNOSIS — R259 Unspecified abnormal involuntary movements: Secondary | ICD-10-CM

## 2011-12-17 MED ORDER — AMOXICILLIN 875 MG PO TABS: 875.0000 mg | ORAL_TABLET | Freq: Two times a day (BID) | ORAL | Status: DC

## 2011-12-17 NOTE — Assessment & Plan Note (Signed)
sxs have finally reached the point of being bothersome to pt.  Would like med to tx if possible.  Discussed appropriateness of neuro referral prior to implementing meds.  Pt expressed understanding and is in agreement w/ plan.

## 2011-12-17 NOTE — Progress Notes (Signed)
  Subjective:    Patient ID: [name], female    DOB: [birth year], 78 y.o.   MRN: [id number]  HPI URI- sxs started 4 weeks ago w/ nasal congestion, cough.  Thought she was improving but now thinks illness is returning.  No fevers.  Cough is intermittently productive of white sputum.  R ear pain.  + facial pain/pressure.  + HA- unusual for pt.  No known sick contacts.  No N/V/D.  Allergic to sulfa.  Head tremor- present for 'years' but pt recently saw a video of herself and was upset by the amount of movement.  No tremor of hands or feet.  Some change in writing recently.   Review of Systems For ROS see HPI     Objective:   Physical Exam  Vitals reviewed. Constitutional: She is oriented to person, place, and time. She appears well-developed and well-nourished. No distress.  HENT:  Head: Normocephalic and atraumatic.  Right Ear: Tympanic membrane normal.  Left Ear: Tympanic membrane normal.  Nose: Mucosal edema and rhinorrhea present. Right sinus exhibits maxillary sinus tenderness and frontal sinus tenderness. Left sinus exhibits maxillary sinus tenderness and frontal sinus tenderness.  Mouth/Throat: Uvula is midline and mucous membranes are normal. Posterior oropharyngeal erythema present. No oropharyngeal exudate.  Eyes: Conjunctivae normal and EOM are normal. Pupils are equal, round, and reactive to light.  Neck: Normal range of motion. Neck supple.  Cardiovascular: Normal rate, regular rhythm and normal heart sounds.   Pulmonary/Chest: Effort normal and breath sounds normal. No respiratory distress. She has no wheezes.  Lymphadenopathy:    She has no cervical adenopathy.  Neurological: She is alert and oriented to person, place, and time. No cranial nerve deficit.       Stooped, mildly shuffling gait Constant bobbing head tremor No tremor of extremities          Assessment & Plan:

## 2011-12-17 NOTE — Patient Instructions (Addendum)
Start the Amoxicillin twice daily- take w/ food Delsym as needed for cough Drink plenty of fluids REST! Hang in there! Happy Holidays!!!

## 2011-12-17 NOTE — Assessment & Plan Note (Signed)
New.  Pt's sxs and PE consistent w/ infxn.  Start abx.  Reviewed supportive care and red flags that should prompt return.  Pt expressed understanding and is in agreement w/ plan.  

## 2011-12-19 ENCOUNTER — Encounter: Payer: Self-pay | Admitting: Neurology

## 2011-12-19 ENCOUNTER — Ambulatory Visit (INDEPENDENT_AMBULATORY_CARE_PROVIDER_SITE_OTHER): Payer: Medicare Other | Admitting: Neurology

## 2011-12-19 VITALS — BP 130/60 | HR 68 | Temp 97.7°F | Resp 16 | Wt 129.0 lb

## 2011-12-19 DIAGNOSIS — G243 Spasmodic torticollis: Secondary | ICD-10-CM | POA: Diagnosis not present

## 2011-12-19 NOTE — Progress Notes (Signed)
Subjective:   [name] was seen in consultation in the movement disorder clinic at the request of Neena Rhymes, MD.  The evaluation is for tremor.  The patient is a 78 y.o. right handed female with a history of tremor.  The tremor has been present for at least 10 years.  The tremor is only present in the head.  The patient reports that she has had tremor for years but it did not bother her until recently.  She saw herself in a video recently, realized the tremor was worse than she thought and now she would like some treatment.  There is a hx of tremor in her mother and her mothers sister, but it was "slight."  The patient has some neck pain but was told it was due to osteoporosis.  She is able to turn head to drive.  She believes that it shakes in the "no" direction.      Current/Previously tried tremor medications: none  Current medications that may exacerbate tremor:  none   Allergies  Allergen Reactions  . Sulfonamide Derivatives     REACTION: rash and itching    Current Outpatient Prescriptions on File Prior to Visit  Medication Sig Dispense Refill  . amoxicillin (AMOXIL) 875 MG tablet Take 1 tablet (875 mg total) by mouth 2 (two) times daily.  20 tablet  0  . aspirin (ASPIR-81) 81 MG EC tablet Take 81 mg by mouth daily.        . cimetidine (TAGAMET) 400 MG tablet Take 400 mg by mouth 2 (two) times daily.        . furosemide (LASIX) 20 MG tablet Take 1 tablet (20 mg total) by mouth daily.  30 tablet  6  . isosorbide mononitrate (IMDUR) 30 MG 24 hr tablet Take 1 tablet (30 mg total) by mouth daily.  30 tablet  6  . lansoprazole (PREVACID) 30 MG capsule Take 1 capsule (30 mg total) by mouth daily.  90 capsule  1  . levothyroxine (SYNTHROID, LEVOTHROID) 125 MCG tablet Take 1 tablet (125 mcg total) by mouth daily.  30 tablet  6  . metoprolol succinate (TOPROL XL) 50 MG 24 hr tablet Take 1 tablet (50 mg total) by mouth 2 (two) times daily.  30 tablet  6  . mupirocin nasal  ointment (BACTROBAN) 2 % Place into the nose 2 (two) times daily. for five (5) days. After application, press sides of nose together and gently massage.      . nitroGLYCERIN (NITROSTAT) 0.4 MG SL tablet Place 0.4 mg under the tongue every 5 (five) minutes as needed. At the onset of chest pain, Up to 3 doses       . pravastatin (PRAVACHOL) 20 MG tablet Take 1 tablet (20 mg total) by mouth at bedtime.  30 tablet  6  . Psyllium (METAFIBER) 48.57 % POWD Take by mouth 3 (three) times daily.       . ramipril (ALTACE) 5 MG capsule Take 1 capsule (5 mg total) by mouth daily.  30 capsule  6  . [DISCONTINUED] ramipril (ALTACE) 5 MG capsule Take 1 capsule (5 mg total) by mouth daily.  30 capsule  3    Past Medical History  Diagnosis Date  . Chest pain 12/09    recent hospitalization for it. felt to be noncardiac.   [name] CAD (coronary artery disease)     s/p prior anterior wall myocardial infarction and multiple percutaneous coronary intervention.  . Ischemic cardiomyopathy  EF 25-30% improved to 45% with biventricular pacing.   . Systolic heart failure     improved to class I to II   . Automatic implantable cardioverter/defibrillator (AICD) activation     s/p implantable cardioverter-defibrillator bivenrticular pacer. 12/05. St, Jude Medtronic. remote-no.  [name] HTN (hypertension)   . Hyperlipidemia   . GERD (gastroesophageal reflux disease)   . Esophageal stricture     last dialated 2007  . Hiatal hernia   . DD (diverticular disease)     Hx of it - severe. Left colon 2004  . Other urinary problems     urinary incontenance  . UTI (lower urinary tract infection)     recurrent. (Klebsiella pneumoniae). Last Cx 02/03/09. (R-ancef, nitrofurantoin, Augmentin, Zyosyn)  . Arthritis   . PVT (paroxysmal ventricular tachycardia)     Past Surgical History  Procedure Date  . Angioplasty     stent  . Unspecified area hysterectomy   . Tubal ligation   . Appendectomy   . Cardiac defibrillator placement      St Jude Atlas    History   Social History  . Marital Status: Widowed    Spouse Name: N/A    Number of Children: N/A  . Years of Education: N/A   Occupational History  . Not on file.   Social History Main Topics  . Smoking status: Never Smoker   . Smokeless tobacco: Never Used  . Alcohol Use: No  . Drug Use: No  . Sexually Active: Not on file   Other Topics Concern  . Not on file   Social History Narrative   Widowed, husband died of brain tumor. Son lives in [place], [place]- was in a MVA for DWI. He has had a long hx of alcohol problems, she has helped him out financially in the past but is ready to be much stricter. Pt. Has a daughter who live in [place] with Breast Ca. She also lost a son to a brain tumor.     No family status information on file.    Review of Systems A complete 10 system ROS was obtained and was negative apart from what is mentioned.   Objective:   VITALS:   Filed Vitals:   12/19/11 0848  BP: 130/60  Pulse: 68  Temp: 97.7 F (36.5 C)  Resp: 16  Weight: 129 lb (58.514 kg)   Gen:  Appears stated age and in NAD. HEENT:  Normocephalic, atraumatic. The mucous membranes are moist. The superficial temporal arteries are without ropiness or tenderness. Cardiovascular: Regular rate and rhythm. Lungs: Clear to auscultation bilaterally. Neck: There are no carotid bruits noted bilaterally.  NEUROLOGICAL:  Orientation:  The patient is alert and oriented x 3.  Recent and remote memory are intact.  Attention span and concentration are normal.  Able to name objects and repeat without trouble.  Fund of knowledge is appropriate Cranial nerves: There is good facial symmetry. The pupils are equal round and reactive to light bilaterally.  Fundoscopic exam is attempted but the disc margins are not well visualized bilaterally.  . Extraocular muscles are intact and visual fields are full to confrontational testing. Speech is fluent and clear. Soft palate rises symmetrically and  there is no tongue deviation. Hearing is intact to conversational tone. Tone: Tone is good throughout.  No rigidity is noted. Sensation: Sensation is intact to light touch throughout (facial, trunk, extremities). Vibration is intact at the bilateral big ankle. There is no extinction with double simultaneous stimulation. There is no  sensory dermatomal level identified. Coordination:  The patient has no dysdiadichokinesia or dysmetria. Motor: Strength is 5/5 in the bilateral upper and lower extremities.  Shoulder shrug is equal bilaterally.  There is no pronator drift.  There are no fasciculations noted. DTR's: Deep tendon reflexes are 1/4 at the bilateral biceps, triceps, brachioradialis, patella and achilles.  Plantar responses are downgoing bilaterally. Gait and Station: The patient has a stooped posture and because of that is somewhat off balance. MOVEMENT EXAM: Tremor:  There is no hand tremor present.  There is very intermittent head tremor (the patient states that it gets significantly worse at times).  There is hypertrophy of the right sternocleidomastoid muscle and there is significant limitation with neck extension.  There is slight shoulder elevation on the left.     Assessment:   Cervical dystonia.  The patient and I talked about the diagnosis.  We talked about various treatment options, which includes not doing anything at all.  We talked about the fact that all medications do not work well for the treatment of this.  Plan:   After a long discussion, including the black blox warning, she decided that she would like to try and pursue Botox injections.  Risks, benefits, side effects and alternative therapies were discussed.  The opportunity to ask questions was given and they were answered to the best of my ability.  The patient expressed understanding and willingness to follow the outlined treatment protocols.  We will try to get prior authorization for this.  I think that her head tremor  may involve both sternocleidomastoid muscles, but the right seems more hypertrophied so we are going to start with that as well as the left levator scapulae.  We talked about risks of dysphagia and head "heaviness."  Understanding is expressed.  She wants to wait until after a January vacation to start this, and I certainly have no objection to that.

## 2011-12-19 NOTE — Patient Instructions (Addendum)
Call to schedule appointment for Botox injections.  [phone]

## 2011-12-21 ENCOUNTER — Ambulatory Visit: Payer: Medicare Other | Admitting: Neurology

## 2011-12-27 DIAGNOSIS — M9981 Other biomechanical lesions of cervical region: Secondary | ICD-10-CM | POA: Diagnosis not present

## 2012-01-07 DIAGNOSIS — M9981 Other biomechanical lesions of cervical region: Secondary | ICD-10-CM | POA: Diagnosis not present

## 2012-01-08 ENCOUNTER — Encounter: Payer: Self-pay | Admitting: *Deleted

## 2012-01-14 DIAGNOSIS — M9981 Other biomechanical lesions of cervical region: Secondary | ICD-10-CM | POA: Diagnosis not present

## 2012-01-19 ENCOUNTER — Emergency Department (HOSPITAL_COMMUNITY): Payer: Medicare Other

## 2012-01-19 ENCOUNTER — Encounter (HOSPITAL_COMMUNITY): Payer: Self-pay | Admitting: Emergency Medicine

## 2012-01-19 ENCOUNTER — Emergency Department (HOSPITAL_COMMUNITY)
Admission: EM | Admit: 2012-01-19 | Discharge: 2012-01-19 | Disposition: A | Discharge status: Home / Self Care | Payer: Medicare Other | Attending: Emergency Medicine | Admitting: Emergency Medicine

## 2012-01-19 DIAGNOSIS — Z8679 Personal history of other diseases of the circulatory system: Secondary | ICD-10-CM | POA: Insufficient documentation

## 2012-01-19 DIAGNOSIS — I10 Essential (primary) hypertension: Secondary | ICD-10-CM | POA: Diagnosis not present

## 2012-01-19 DIAGNOSIS — Z87440 Personal history of urinary (tract) infections: Secondary | ICD-10-CM | POA: Insufficient documentation

## 2012-01-19 DIAGNOSIS — Z95810 Presence of automatic (implantable) cardiac defibrillator: Secondary | ICD-10-CM | POA: Diagnosis not present

## 2012-01-19 DIAGNOSIS — Z8739 Personal history of other diseases of the musculoskeletal system and connective tissue: Secondary | ICD-10-CM | POA: Insufficient documentation

## 2012-01-19 DIAGNOSIS — Z7982 Long term (current) use of aspirin: Secondary | ICD-10-CM | POA: Insufficient documentation

## 2012-01-19 DIAGNOSIS — R079 Chest pain, unspecified: Secondary | ICD-10-CM | POA: Insufficient documentation

## 2012-01-19 DIAGNOSIS — E785 Hyperlipidemia, unspecified: Secondary | ICD-10-CM | POA: Insufficient documentation

## 2012-01-19 DIAGNOSIS — I5020 Unspecified systolic (congestive) heart failure: Secondary | ICD-10-CM | POA: Diagnosis not present

## 2012-01-19 DIAGNOSIS — I2510 Atherosclerotic heart disease of native coronary artery without angina pectoris: Secondary | ICD-10-CM | POA: Insufficient documentation

## 2012-01-19 DIAGNOSIS — Z8719 Personal history of other diseases of the digestive system: Secondary | ICD-10-CM | POA: Insufficient documentation

## 2012-01-19 DIAGNOSIS — R0789 Other chest pain: Secondary | ICD-10-CM | POA: Diagnosis not present

## 2012-01-19 DIAGNOSIS — Z79899 Other long term (current) drug therapy: Secondary | ICD-10-CM | POA: Insufficient documentation

## 2012-01-19 DIAGNOSIS — Z9889 Other specified postprocedural states: Secondary | ICD-10-CM | POA: Diagnosis not present

## 2012-01-19 LAB — CBC
HCT: 39.2 % (ref 36.0–46.0)
Hemoglobin: 13.4 g/dL (ref 12.0–15.0)
WBC: 6.6 10*3/uL (ref 4.0–10.5)

## 2012-01-19 LAB — BASIC METABOLIC PANEL
BUN: 18 mg/dL (ref 6–23)
CO2: 30 mEq/L (ref 19–32)
Chloride: 103 mEq/L (ref 96–112)
Glucose, Bld: 102 mg/dL — ABNORMAL HIGH (ref 70–99)
Potassium: 4.9 mEq/L (ref 3.5–5.1)

## 2012-01-19 NOTE — ED Notes (Signed)
Pt states that about an hour ago, she had centralized chest pain that radiated to her jaw.  Pt took 2 aspirin and 2 nitro (separately) and is feeling some better.  Pain 8/10.  Pt has a Facilities manager.

## 2012-01-19 NOTE — ED Provider Notes (Signed)
History     CSN: [id number]  Arrival date & time 01/19/12  1731   First MD Initiated Contact with Patient 01/19/12 1810      Chief Complaint  Patient presents with  . Chest Pain    (Consider location/radiation/quality/duration/timing/severity/associated sxs/prior treatment) Patient is a 79 y.o. female presenting with chest pain. The history is provided by the patient and a relative.  Chest Pain Pertinent negatives for primary symptoms include no fever, no shortness of breath, no cough, no palpitations and no abdominal pain.   pt with hx ischemic cm, cad, pacemaker/defib, states onset cp today at approximately 4 pm, radiate to right face. Was constant. Lasted approximately 30 minutes. Relieved a couple minutes after taking ntg. Feels similar to prior cardiac cp which she gets infrequently. Denies associated sob, nv or diaphoresis. Pain was not pleuritic, and has resolved. No leg pain or swelling. Denies other recent cp. No cough, fever, or other uri c/o. Has pacer/defib, denies palpitations, no hx shock being delivered.     Past Medical History  Diagnosis Date  . Chest pain 12/09    recent hospitalization for it. felt to be noncardiac.   [name] CAD (coronary artery disease)     s/p prior anterior wall myocardial infarction and multiple percutaneous coronary intervention.  . Ischemic cardiomyopathy     EF 25-30% improved to 45% with biventricular pacing.   . Systolic heart failure     improved to class I to II   . Automatic implantable cardioverter/defibrillator (AICD) activation     s/p implantable cardioverter-defibrillator bivenrticular pacer. 12/05. St, Jude Medtronic. remote-no.  [name] HTN (hypertension)   . Hyperlipidemia   . GERD (gastroesophageal reflux disease)   . Esophageal stricture     last dialated 2007  . Hiatal hernia   . DD (diverticular disease)     Hx of it - severe. Left colon 2004  . Other urinary problems     urinary incontenance  . UTI (lower urinary tract  infection)     recurrent. (Klebsiella pneumoniae). Last Cx 02/03/09. (R-ancef, nitrofurantoin, Augmentin, Zyosyn)  . Arthritis   . PVT (paroxysmal ventricular tachycardia)     Past Surgical History  Procedure Date  . Angioplasty     stent  . Unspecified area hysterectomy   . Tubal ligation   . Appendectomy   . Cardiac defibrillator placement     [facility]  . Cataract extraction, bilateral   . Total knee arthroplasty     bilateral    Family History  Problem Relation Age of Onset  . Colon cancer Neg Hx   . Colon polyps Sister   . Heart disease Mother   . Heart disease Father   . Heart disease Brother     multiple brothers  . Heart disease Sister     multiple sisters  . Prostate cancer Brother   . Diabetes Daughter   . Diabetes      aunt  . Breast cancer Daughter   . Alcohol abuse Son     History  Substance Use Topics  . Smoking status: Never Smoker   . Smokeless tobacco: Never Used  . Alcohol Use: No    OB History    Grav Para Term Preterm Abortions TAB SAB Ect Mult Living                  Review of Systems  Constitutional: Negative for fever and chills.  HENT: Negative for neck pain.   Eyes: Negative for redness.  Respiratory: Negative for cough and shortness of breath.   Cardiovascular: Positive for chest pain. Negative for palpitations and leg swelling.  Gastrointestinal: Negative for abdominal pain.  Genitourinary: Negative for flank pain.  Musculoskeletal: Negative for back pain.  Skin: Negative for rash.  Neurological: Negative for headaches.  Hematological: Does not bruise/bleed easily.  Psychiatric/Behavioral: Negative for confusion.    Allergies  Sulfonamide derivatives  Home Medications   Current Outpatient Rx  Name  Route  Sig  Dispense  Refill  . ASPIRIN 81 MG PO TBEC   Oral   Take 81 mg by mouth daily.           . FUROSEMIDE 20 MG PO TABS   Oral   Take 20 mg by mouth every other day.         . ISOSORBIDE MONONITRATE ER  30 MG PO TB24   Oral   Take 30 mg by mouth every morning.         [name] LEVOTHYROXINE SODIUM 125 MCG PO TABS   Oral   Take 1 tablet (125 mcg total) by mouth daily.   30 tablet   6   . METOPROLOL SUCCINATE ER 50 MG PO TB24   Oral   Take 50 mg by mouth 2 (two) times daily.         [name] NITROGLYCERIN 0.4 MG SL SUBL   Sublingual   Place 0.4 mg under the tongue every 5 (five) minutes as needed. At the onset of chest pain, Up to 3 doses         . PRAVASTATIN SODIUM 20 MG PO TABS   Oral   Take 1 tablet (20 mg total) by mouth at bedtime.   30 tablet   6   . PSYLLIUM 48.57 % PO POWD   Oral   Take 15 mLs by mouth at bedtime.          [name] RAMIPRIL 5 MG PO CAPS   Oral   Take 5 mg by mouth every morning.           BP 119/60  Pulse 90  Temp 98.4 F (36.9 C) (Oral)  Resp 18  SpO2 96%  Physical Exam  Nursing note and vitals reviewed. Constitutional: She is oriented to person, place, and time. She appears well-developed and well-nourished. No distress.  HENT:  Mouth/Throat: Oropharynx is clear and moist.  Eyes: Conjunctivae normal are normal. No scleral icterus.  Neck: Neck supple. No tracheal deviation present.  Cardiovascular: Normal rate, regular rhythm, normal heart sounds and intact distal pulses.   Pulmonary/Chest: Effort normal and breath sounds normal. No respiratory distress.  Abdominal: Soft. Normal appearance and bowel sounds are normal. She exhibits no distension. There is no tenderness.  Musculoskeletal: She exhibits no edema and no tenderness.  Neurological: She is alert and oriented to person, place, and time.  Skin: Skin is warm and dry. No rash noted.  Psychiatric: She has a normal mood and affect.    ED Course  Procedures (including critical care time)   Labs Reviewed  CBC  BASIC METABOLIC PANEL    Results for orders placed during the hospital encounter of 01/19/12  CBC      Component Value Range   WBC 6.6  4.0 - 10.5 K/uL   RBC 4.36  3.87 -  5.11 MIL/uL   Hemoglobin 13.4  12.0 - 15.0 g/dL   HCT 65.7  84.6 - 96.2 %   MCV 89.9  78.0 - 100.0 fL  MCH 30.7  26.0 - 34.0 pg   MCHC 34.2  30.0 - 36.0 g/dL   RDW 56.2  13.0 - 86.5 %   Platelets 205  150 - 400 K/uL  BASIC METABOLIC PANEL      Component Value Range   Sodium 140  135 - 145 mEq/L   Potassium 4.9  3.5 - 5.1 mEq/L   Chloride 103  96 - 112 mEq/L   CO2 30  19 - 32 mEq/L   Glucose, Bld 102 (*) 70 - 99 mg/dL   BUN 18  6 - 23 mg/dL   Creatinine, Ser 7.84  0.50 - 1.10 mg/dL   Calcium 69.6 (*) 8.4 - 10.5 mg/dL   GFR calc non Af Amer 79 (*) >90 mL/min   GFR calc Af Amer >90  >90 mL/min  TROPONIN I      Component Value Range   Troponin I <0.30  <0.30 ng/mL   Dg Chest Port 1 View  01/19/2012  *RADIOLOGY REPORT*  Clinical Data: Chest pain  PORTABLE CHEST - 1 VIEW  Comparison: 10/18/2009  Findings: Left chest wall AICD is noted with leads in the right atrial appendage, coronary sinus and right ventricle.  Normal heart size.  Marked asymmetric elevation of the right hemidiaphragm. Moderate hiatal hernia.  No effusions or edema.  No airspace consolidation.  Expansile lesion of the left fifth rib posteriorly is unchanged.  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  IMPRESSION:  1.  Chronic asymmetric elevation of the right hemidiaphragm. 2.  No acute findings.   Original Report Authenticated By: Signa Kell, M.D.       MDM  Iv ns. Monitor. Ecg.  Labs.   Reviewed nursing notes and prior charts for additional history.    Recheck no chest pain.  Discussed pt with [place] card md on call, including hx, prior caths, todays cp etc.  - he indicates may d/c pt to home w imdur 30 mg a day, and to have f/u closely with Dr Graciela Husbands early this week.  He also states if pt prefers, they can admit for cp r/o.   Discussed tx options w pt per discussion with her on call cardiologist.  Pt requests d/c from ed to home, states will f/u Monday.  Does not want to be  admitted to hospital.         Suzi Roots, MD 01/20/12 1530

## 2012-01-21 ENCOUNTER — Encounter: Payer: Self-pay | Admitting: Physician Assistant

## 2012-01-21 ENCOUNTER — Encounter: Payer: Self-pay | Admitting: Internal Medicine

## 2012-01-21 ENCOUNTER — Ambulatory Visit (INDEPENDENT_AMBULATORY_CARE_PROVIDER_SITE_OTHER): Payer: Medicare Other | Admitting: *Deleted

## 2012-01-21 ENCOUNTER — Ambulatory Visit (INDEPENDENT_AMBULATORY_CARE_PROVIDER_SITE_OTHER): Payer: Medicare Other | Admitting: Physician Assistant

## 2012-01-21 ENCOUNTER — Telehealth: Payer: Self-pay | Admitting: Internal Medicine

## 2012-01-21 VITALS — BP 138/76 | HR 71 | Ht <= 58 in | Wt 132.2 lb

## 2012-01-21 DIAGNOSIS — I2589 Other forms of chronic ischemic heart disease: Secondary | ICD-10-CM | POA: Diagnosis not present

## 2012-01-21 DIAGNOSIS — I5022 Chronic systolic (congestive) heart failure: Secondary | ICD-10-CM | POA: Diagnosis not present

## 2012-01-21 DIAGNOSIS — I472 Ventricular tachycardia: Secondary | ICD-10-CM

## 2012-01-21 DIAGNOSIS — R079 Chest pain, unspecified: Secondary | ICD-10-CM | POA: Diagnosis not present

## 2012-01-21 DIAGNOSIS — I2510 Atherosclerotic heart disease of native coronary artery without angina pectoris: Secondary | ICD-10-CM | POA: Diagnosis not present

## 2012-01-21 DIAGNOSIS — Z95810 Presence of automatic (implantable) cardiac defibrillator: Secondary | ICD-10-CM | POA: Diagnosis not present

## 2012-01-21 DIAGNOSIS — M9981 Other biomechanical lesions of cervical region: Secondary | ICD-10-CM | POA: Diagnosis not present

## 2012-01-21 DIAGNOSIS — I4729 Other ventricular tachycardia: Secondary | ICD-10-CM

## 2012-01-21 LAB — ICD DEVICE OBSERVATION
BAMS-0003: 70 {beats}/min
DEVICE MODEL ICD: 815822
TZAT-0001FASTVT: 1
TZAT-0004SLOWVT: 8
TZAT-0012FASTVT: 200 ms
TZAT-0012SLOWVT: 200 ms
TZAT-0013SLOWVT: 4
TZAT-0018SLOWVT: NEGATIVE
TZAT-0019FASTVT: 7.5 V
TZAT-0019SLOWVT: 7.5 V
TZAT-0020FASTVT: 1 ms
TZON-0003SLOWVT: 365 ms
TZON-0004FASTVT: 30
TZON-0004SLOWVT: 45
TZON-0005FASTVT: 6
TZON-0010FASTVT: 40 ms
TZST-0001FASTVT: 4
TZST-0001SLOWVT: 3
TZST-0001SLOWVT: 5
TZST-0003FASTVT: 25 J
TZST-0003FASTVT: 36 J

## 2012-01-21 MED ORDER — ISOSORBIDE MONONITRATE ER 30 MG PO TB24: 45.0000 mg | ORAL_TABLET | Freq: Every morning | ORAL | Status: DC

## 2012-01-21 NOTE — Patient Instructions (Addendum)
Your physician has requested that you have a lexiscan myoview DX 786.50; PT GOING OUT OF THE COUNTRY NEXT WEEK; PER SCOTT WEAVER, PAC NEEDS TO BE TOMORROW BUT NO LATER THEN WED 1/15. For further information please visit [email or web address] Please follow instruction sheet, as given.   Your physician has recommended you make the following change in your medication: INCREASE IMDUR TO 45 MG DAILY, RX SENT TO [facility] [address]  PLEASE FOLLOW UP WITH DR. Graciela Husbands IN THE NEXT 1-2 MONTHS PER SCOTT WEAVER, PAC

## 2012-01-21 NOTE — Progress Notes (Signed)
[address] [place], [place]  [address] Phone: [phone], Fax:  [phone]  Date:  01/21/2012   Name:  [name]   DOB:  Jun 05, 1921   MRN:  [id number]  PCP:  Neena Rhymes, MD  Primary Cardiologist:  Dr. Sherryl Manges  Primary Electrophysiologist:  Dr. Sherryl Manges    History of Present Illness: [name] is a 79 y.o. female who returns for followup after a recent trip the emergency room for chest pain.  She has a hx of CAD, s/p previous anterior wall MI and stenting of the RCA x2 in 5/03, s/p Taxus DES to the dCFX 10/05, chronic systolic CHF, ischemic cardiomyopathy with prior EF 25-30% improved to 45% after CRT-D placement, ventricular tachycardia, HTN, HL, GERD, esophageal stricture status post prior dilatation. Last LHC 10/05: RCA stent site patent, PL branch of the RCA 50%, dCFX 80% treated with Taxus DES. Echo 5/08: EF 45%, inferoposterior HK, mild HK of the anterior wall, moderate to severe MR, moderate LAE, mildly increased PASP. Last nuclear study a 12/10: No scar or ischemia, EF 54%.  ABIs 7/12:  Normal bilaterally.  Last seen by Dr. Graciela Husbands in 7/13.  Patient was seen in the emergency room 01/19/12 with complaints of chest pain. Chest x-ray without acute findings (chronic asymmetric elevation of the right hemidiaphragm). ECG was ventricular paced.  Cardiac markers were normal. EDP discussed with on-call physician and decision was made to proceed with initiation of isosorbide and followup today.  Of note, patient already taking isosorbide QD.    She notes occasional chest discomfort. This often comes on with exertion. This has been stable over several years without significant change. She also has had chest discomfort associated with her esophageal stricture in the past. The symptoms that prompted her to go the ED did not remind her of her previous unstable angina that she experienced prior to PCI. She has been quite busy over the last several  weeks. She's been cooking several meals at home as well as for her church. She is preparing to go to [place] and the [employer] next week. She's been working several hours a day and has not been sleeping very well.  Her chest discomfort lasted about 4 hours. Nitroglycerin seemed to make it better. She did have radiation to her right jaw and back. She also noted shortness of breath. No assoc nausea or diaphoresis.  She has not had a recurrence. She denies orthopnea or PND. She has mild chronic pedal edema. No change.  She notes class IIb dyspnea. She denies syncope or near-syncope or rapid palpitations.  Labs (7/13):  K 4.2, creatinine 0.7, ALT 15, LDL 63, TSH 1.86 Labs (1/14):  K 4.9, creatinine 0.61, Hgb 13.4  Wt Readings from Last 3 Encounters:  01/21/12 132 lb 3.2 oz (59.966 kg)  12/19/11 129 lb (58.514 kg)  12/17/11 125 lb 12.8 oz (57.063 kg)     Past Medical History  Diagnosis Date  . Chest pain 12/09    recent hospitalization for it. felt to be noncardiac.   [name] CAD (coronary artery disease)     s/p prior anterior wall myocardial infarction and multiple percutaneous coronary intervention.  . Ischemic cardiomyopathy     EF 25-30% improved to 45% with biventricular pacing.   . Systolic heart failure     improved to class I to II   . Automatic implantable cardioverter/defibrillator (AICD) activation     s/p implantable cardioverter-defibrillator bivenrticular pacer. 12/05. St, Jude  Atlas. remote-no.  [name] HTN (hypertension)   . Hyperlipidemia   . GERD (gastroesophageal reflux disease)   . Esophageal stricture     last dialated 2007  . Hiatal hernia   . DD (diverticular disease)     Hx of it - severe. Left colon 2004  . Other urinary problems     urinary incontenance  . UTI (lower urinary tract infection)     recurrent. (Klebsiella pneumoniae). Last Cx 02/03/09. (R-ancef, nitrofurantoin, Augmentin, Zyosyn)  . Arthritis   . PVT (paroxysmal ventricular tachycardia)     Current  Outpatient Prescriptions  Medication Sig Dispense Refill  . aspirin (ASPIR-81) 81 MG EC tablet Take 81 mg by mouth daily.        . furosemide (LASIX) 20 MG tablet Take 20 mg by mouth every other day.      . isosorbide mononitrate (IMDUR) 30 MG 24 hr tablet Take 30 mg by mouth every morning.      [name] levothyroxine (SYNTHROID, LEVOTHROID) 125 MCG tablet Take 1 tablet (125 mcg total) by mouth daily.  30 tablet  6  . metoprolol succinate (TOPROL-XL) 50 MG 24 hr tablet Take 50 mg by mouth 2 (two) times daily.      . nitroGLYCERIN (NITROSTAT) 0.4 MG SL tablet Place 0.4 mg under the tongue every 5 (five) minutes as needed. At the onset of chest pain, Up to 3 doses      . pravastatin (PRAVACHOL) 20 MG tablet Take 1 tablet (20 mg total) by mouth at bedtime.  30 tablet  6  . Psyllium (METAFIBER) 48.57 % POWD Take 15 mLs by mouth at bedtime.       . ramipril (ALTACE) 5 MG capsule Take 5 mg by mouth every morning.        Allergies: Allergies  Allergen Reactions  . Sulfonamide Derivatives     REACTION: rash and itching    Social History:  The patient  reports that she has never smoked. She has never used smokeless tobacco. She reports that she does not drink alcohol or use illicit drugs.   ROS:  Please see the history of present illness.      All other systems reviewed and negative.   PHYSICAL EXAM: VS:  BP 138/76  Pulse 71  Ht 4' 9.5" (1.461 m)  Wt 132 lb 3.2 oz (59.966 kg)  BMI 28.11 kg/m2 Well nourished, well developed, in no acute distress HEENT: normal Neck: no JVD Cardiac:  normal S1, S2; RRR; no murmur Lungs:  clear to auscultation bilaterally, no wheezing, rhonchi or rales Abd: soft, nontender, no hepatomegaly Ext: very trace bilateral LE edema Skin: warm and dry Neuro:  CNs 2-12 intact, no focal abnormalities noted  EKG:  NSR, HR 71, ventricular paced     Device Interrogation today:  No sustained ventricular arrhythmias noted  ASSESSMENT AND PLAN:  1. Chest Pain:   She has a  long hx of symptoms that are fairly consistent with stable exertional angina.  Prolonged episode 2 days ago preceded by several days of exhaustive activities at her church, etc.  She has rested more since her trip to the ED and does feel better.  No VT on ICD interrogation today.  She already takes Isosorbide 30 mg QD.  We had a long discussion regarding further testing vs medical Rx alone.  She would like to pursue evaluation for ischemia and would agree to further workup if indicated (ie cardiac cath).  -  Increase Isosorbide to 45 mg QD.  -  Arrange Lexiscan Myoview.  If abnormal, plan cardiac cath.  2. Coronary Artery Disease:  Continue ASA, statin.  Plan myoview as noted.  3. Chronic Systolic CHF:  Volume stable.  Continue beta blocker and ACE.  4. Ventricular Tachycardia:  No evidence of VTach on ICD interrogation.  5. Disposition:  Follow up with Dr. Sherryl Manges in 1-2 mos or sooner if myoview abnormal.  Signed, Tereso Newcomer, PA-C  10:30 AM 01/21/2012

## 2012-01-21 NOTE — Progress Notes (Signed)
Patient seen at the request of Tereso Newcomer, [place].  See PaceArt for details.  Device interrogation only.  Gypsy Balsam, RN, BSN 01/21/2012

## 2012-01-21 NOTE — Telephone Encounter (Deleted)
g

## 2012-01-22 ENCOUNTER — Encounter: Payer: Self-pay | Admitting: Cardiology

## 2012-01-22 ENCOUNTER — Encounter (HOSPITAL_COMMUNITY): Payer: Medicare Other

## 2012-01-23 ENCOUNTER — Ambulatory Visit (HOSPITAL_COMMUNITY): Payer: Medicare Other | Attending: Cardiovascular Disease | Admitting: Radiology

## 2012-01-23 VITALS — Ht <= 58 in | Wt 129.0 lb

## 2012-01-23 DIAGNOSIS — I2510 Atherosclerotic heart disease of native coronary artery without angina pectoris: Secondary | ICD-10-CM

## 2012-01-23 DIAGNOSIS — I447 Left bundle-branch block, unspecified: Secondary | ICD-10-CM | POA: Insufficient documentation

## 2012-01-23 DIAGNOSIS — R0989 Other specified symptoms and signs involving the circulatory and respiratory systems: Secondary | ICD-10-CM | POA: Insufficient documentation

## 2012-01-23 DIAGNOSIS — R0609 Other forms of dyspnea: Secondary | ICD-10-CM | POA: Diagnosis not present

## 2012-01-23 DIAGNOSIS — R079 Chest pain, unspecified: Secondary | ICD-10-CM | POA: Diagnosis not present

## 2012-01-23 DIAGNOSIS — I509 Heart failure, unspecified: Secondary | ICD-10-CM | POA: Diagnosis not present

## 2012-01-23 DIAGNOSIS — I5022 Chronic systolic (congestive) heart failure: Secondary | ICD-10-CM

## 2012-01-23 DIAGNOSIS — R0789 Other chest pain: Secondary | ICD-10-CM | POA: Insufficient documentation

## 2012-01-23 DIAGNOSIS — R0602 Shortness of breath: Secondary | ICD-10-CM

## 2012-01-23 DIAGNOSIS — I10 Essential (primary) hypertension: Secondary | ICD-10-CM | POA: Insufficient documentation

## 2012-01-23 DIAGNOSIS — I2589 Other forms of chronic ischemic heart disease: Secondary | ICD-10-CM

## 2012-01-23 MED ORDER — TECHNETIUM TC 99M SESTAMIBI GENERIC - CARDIOLITE
33.0000 | Freq: Once | INTRAVENOUS | Status: AC | PRN
  Administered 2012-01-23: 33 via INTRAVENOUS

## 2012-01-23 MED ORDER — TECHNETIUM TC 99M SESTAMIBI GENERIC - CARDIOLITE
11.0000 | Freq: Once | INTRAVENOUS | Status: AC | PRN
  Administered 2012-01-23: 11 via INTRAVENOUS

## 2012-01-23 MED ORDER — ADENOSINE (DIAGNOSTIC) 3 MG/ML IV SOLN
0.5600 mg/kg | Freq: Once | INTRAVENOUS | Status: AC
  Administered 2012-01-23: 32.7 mg via INTRAVENOUS

## 2012-01-23 NOTE — Progress Notes (Signed)
[facility] SITE 3 NUCLEAR MED [address] [name], [place] [address] [phone]    Cardiology Nuclear Med Study  [name] is a 79 y.o. female     MRN : [id number]     DOB: Feb 18, 1921  Procedure Date: 01/23/2012  Nuclear Med Background Indication for Stress Test:  Evaluation for Ischemia and PTCA/Stent Patency History: Chronic CHF, ICM, '03 Stent x 2 RCA,10-05 Heart Catheterization-RCA stent patent, RCA 50%, totalled LAD, CFX 80%> Stent CFX, EF=25%, 12-05 AICD, 5-08 Echo: EF=45%, moderate to severe MR, and 12/10 Myocardial Perfusion Study-No scar or ischemia, EF=54%' Cardiac Risk Factors: Family History - CAD, Hypertension, LBBB and Lipids  Symptoms: Chest Pain/tightness with/without exertion (last occurrence 01-19-12),  DOE and SOB   Nuclear Pre-Procedure Caffeine/Decaff Intake:  None > 12 hrs NPO After: 9:00pm   Lungs:  clear O2 Sat: 96% on room air. IV 0.9% NS with Angio Cath:  20g  IV Site: R Antecubital x 1, tolerated well IV Started by:  Irean Hong, RN  Chest Size (in):  38 Cup Size: C  Height: 4' 9.5" (1.461 m)  Weight:  129 lb (58.514 kg)  BMI:  Body mass index is 27.43 kg/(m^2). Tech Comments:  Held metoprolol this am    Nuclear Med Study 1 or 2 day study: 1 day  Stress Test Type:  Adenosine  Reading MD: Kristeen Miss, MD  Order Authorizing Provider:  Sherryl Manges, MD, and Tereso Newcomer, [facility]  Resting Radionuclide: Technetium 80m Sestamibi  Resting Radionuclide Dose: 11.0 mCi   Stress Radionuclide:  Technetium 103m Sestamibi  Stress Radionuclide Dose: 33.0 mCi           Stress Protocol Rest HR: 75 Stress HR: 84  Rest BP: 133/75 Stress BP: 162/79  Exercise Time (min): n/a METS: n/a   Predicted Max HR: 132 bpm % Max HR: 63.64 bpm Rate Pressure Product: 21308    Dose of Adenosine (mg):  32.8 Dose of Lexiscan: n/a mg  Dose of Atropine (mg): n/a Dose of Dobutamine: n/a mcg/kg/min (at max HR)  Stress Test Technologist: Irean Hong, RN   Nuclear Technologist:  Domenic Polite, CNMT     Rest Procedure:  Myocardial perfusion imaging was performed at rest 45 minutes following the intravenous administration of Technetium 27m Sestamibi. Rest ECG: NSR with biventricular pacing.  Stress Procedure:  The patient received IV adenosine at 140 mcg/kg/min for 4 minutes.  The patient complained of chest and throat tightness, and headache. There were frequent PVC's, and couplet x 2. Technetium 60m Sestamibi was injected at the 2 minute mark and quantitative spect images were obtained after a 45 minute delay. Stress ECG: No significant change from baseline ECG  QPS Raw Data Images:  There is interference from nuclear activity from structures below the diaphragm. This does not affect the ability to read the study. Stress Images:  The uptake seems to be inhomogeneous.  There is mild attenuation of the apex and inferior basal.  walls  with normal uptake in the other regions.   Rest Images:  The uptake seems to be inhomogeneous.  There is mild attenuation of the apex and inferior basal.  walls  with normal uptake in the other regions. Subtraction (SDS):  No evidence of ischemia. Transient Ischemic Dilatation (Normal <1.22):  1.03 Lung/Heart Ratio (Normal <0.45):  0.31  Quantitative Gated Spect Images QGS EDV:  88 ml QGS ESV:  57 ml  Impression Exercise Capacity:  Adenosine study with no exercise. BP Response:  Normal  blood pressure response. Clinical Symptoms:  No significant symptoms noted. ECG Impression:  No significant ST segment change suggestive of ischemia. Comparison with Prior Nuclear Study: No significant change from previous study 12/13/08  Overall Impression:  Low risk stress nuclear study.  The inferior and apical defects are likely due to attenuation artifact.    LV Ejection Fraction: 35%.   The actual EF is higher than the 35% obtained by the computer (by visual estimation the EF is around 55-60%).    LV Wall Motion:  NL LV  Function; NL Wall Motion.   [name], [name]., MD, [facility] 01/23/2012, 5:55 PM Office - [phone] Pager [phone]

## 2012-01-24 ENCOUNTER — Telehealth: Payer: Self-pay | Admitting: *Deleted

## 2012-01-24 ENCOUNTER — Encounter: Payer: Self-pay | Admitting: Physician Assistant

## 2012-01-24 NOTE — Telephone Encounter (Signed)
Message copied by Tarri Fuller on Thu Jan 24, 2012 10:28 AM ------      Message from: [place], [place] T      Created: Thu Jan 24, 2012  9:57 AM       Low risk stress test.      No sign of ischemia.      Continue with current treatment plan.      Tereso Newcomer, PA-C  9:57 AM 01/24/2012

## 2012-01-24 NOTE — Telephone Encounter (Signed)
Pt notified about myoview results with verbal understanding today, pt said she is very happy not that she can go on her trip to the [facility] Tuesday with her grandson

## 2012-01-25 ENCOUNTER — Ambulatory Visit (INDEPENDENT_AMBULATORY_CARE_PROVIDER_SITE_OTHER): Payer: Medicare Other | Admitting: Family Medicine

## 2012-01-25 ENCOUNTER — Encounter: Payer: Self-pay | Admitting: Family Medicine

## 2012-01-25 VITALS — BP 138/84 | HR 83 | Temp 97.8°F | Ht 59.75 in | Wt 132.8 lb

## 2012-01-25 DIAGNOSIS — J309 Allergic rhinitis, unspecified: Secondary | ICD-10-CM

## 2012-01-25 DIAGNOSIS — M549 Dorsalgia, unspecified: Secondary | ICD-10-CM | POA: Diagnosis not present

## 2012-01-25 DIAGNOSIS — L509 Urticaria, unspecified: Secondary | ICD-10-CM | POA: Diagnosis not present

## 2012-01-25 DIAGNOSIS — F419 Anxiety disorder, unspecified: Secondary | ICD-10-CM | POA: Insufficient documentation

## 2012-01-25 DIAGNOSIS — J302 Other seasonal allergic rhinitis: Secondary | ICD-10-CM

## 2012-01-25 DIAGNOSIS — F411 Generalized anxiety disorder: Secondary | ICD-10-CM | POA: Diagnosis not present

## 2012-01-25 MED ORDER — ALPRAZOLAM 0.25 MG PO TABS: 0.2500 mg | ORAL_TABLET | Freq: Two times a day (BID) | ORAL | Status: DC | PRN

## 2012-01-25 MED ORDER — FLUTICASONE PROPIONATE 50 MCG/ACT NA SUSP: 2.0000 | Freq: Every day | NASAL | Status: DC

## 2012-01-25 MED ORDER — METHYLPREDNISOLONE ACETATE 80 MG/ML IJ SUSP
80.0000 mg | Freq: Once | INTRAMUSCULAR | Status: AC
  Administered 2012-01-25: 80 mg via INTRAMUSCULAR

## 2012-01-25 NOTE — Patient Instructions (Addendum)
Start OTC Claritin or Zyrtec daily to decrease allergy congestion Add Flonase- 2 sprays each nostril daily Take the Xanax as needed for anxiety REST!  RELAX!  Slow down a little! Call with any questions or concerns Have a wonderful trip!!!

## 2012-01-25 NOTE — Progress Notes (Signed)
  Subjective:    Patient ID: [name], female    DOB: 06/09/1921, 79 y.o.   MRN: [id number]  HPI CP- went to ER and had normal, '[name] risk' nuclear stress.  Has not had recurrent chest pain since.  Reports she was 'exhausted' and got 'too run down over the holidays'.  Sinus pressure- pt was seen Dec 9 for sinus infection.  Pt reports upon completion of Amox she again developed nasal pain, sinus pressure.  Recently has been unable to use netti pot due to congestion/blockage.  No fevers.  No ear pain, no tooth pain.  + eye redness.  Anxiety- granddaughter reports pt has been very anxious and when she is anxious she will break out in diffuse hives.  Has taken husband's left over 'tranquilizer' (xanax) w/ some relief.  Back pain- pt has chronic pain and last time prior to flying received depo-medrol injxn w/ good pain relief and prevented hives.   Review of Systems For ROS see HPI     Objective:   Physical Exam  Vitals reviewed. Constitutional: She is oriented to person, place, and time. She appears well-developed and well-nourished. No distress.  HENT:  Head: Normocephalic and atraumatic.  Right Ear: Tympanic membrane normal.  Left Ear: Tympanic membrane normal.  Nose: Mucosal edema and rhinorrhea present. Right sinus exhibits no maxillary sinus tenderness and no frontal sinus tenderness. Left sinus exhibits no maxillary sinus tenderness and no frontal sinus tenderness.  Mouth/Throat: Mucous membranes are normal. Posterior oropharyngeal erythema (w/ PND) present.  Eyes: Conjunctivae normal and EOM are normal. Pupils are equal, round, and reactive to light.  Neck: Normal range of motion. Neck supple. No thyromegaly present.  Cardiovascular: Normal rate, regular rhythm, normal heart sounds and intact distal pulses.   No murmur heard. Pulmonary/Chest: Effort normal and breath sounds normal. No respiratory distress. She has no wheezes. She has no rales.  Abdominal: Soft. She exhibits  no distension. There is no tenderness.  Musculoskeletal: She exhibits no edema.  Lymphadenopathy:    She has no cervical adenopathy.  Neurological: She is alert and oriented to person, place, and time.  Skin: Skin is warm and dry.  Psychiatric: She has a normal mood and affect. Her behavior is normal.          Assessment & Plan:

## 2012-02-10 NOTE — Assessment & Plan Note (Signed)
No definitive cause but family notes hives tend to only appear w/ high stress.  Will start xanax prn (see above)

## 2012-02-10 NOTE — Assessment & Plan Note (Signed)
New.  Pt to start low dose xanax prn to help w/ anxiety.  Discussed risk of meds- sedation, addiction potential- pt aware and accepts risk.  Will follow.

## 2012-02-10 NOTE — Assessment & Plan Note (Signed)
Recurrent issue for pt, no evidence of infxn.  Start otc antihistamine and nasal steroid.  Reviewed supportive care and red flags that should prompt return.  Pt expressed understanding and is in agreement w/ plan.

## 2012-02-10 NOTE — Assessment & Plan Note (Signed)
Chronic problem.  Pt's back tends to hurt on prolonged plane rides, last trip got depo-medrol injxn prior to flight and had no pain.  Pt is leaving for [place], requesting injxn.

## 2012-02-18 ENCOUNTER — Telehealth: Payer: Self-pay | Admitting: Family Medicine

## 2012-02-18 DIAGNOSIS — I10 Essential (primary) hypertension: Secondary | ICD-10-CM

## 2012-02-18 MED ORDER — RAMIPRIL 5 MG PO CAPS: 5.0000 mg | ORAL_CAPSULE | Freq: Every morning | ORAL | Status: DC

## 2012-02-18 NOTE — Telephone Encounter (Signed)
Refill for ramipril sent to [facility] on [address], [place]

## 2012-02-18 NOTE — Telephone Encounter (Signed)
refill  Ramipril (Cap) 5 MG Take 5 mg by mouth every morning. #30 wt/3-refills last fill 1.9.14

## 2012-02-19 ENCOUNTER — Ambulatory Visit (INDEPENDENT_AMBULATORY_CARE_PROVIDER_SITE_OTHER): Payer: Medicare Other | Admitting: Neurology

## 2012-02-19 ENCOUNTER — Encounter: Payer: Self-pay | Admitting: Neurology

## 2012-02-19 VITALS — BP 118/64 | HR 84 | Temp 97.8°F | Resp 12 | Ht 59.0 in | Wt 132.0 lb

## 2012-02-19 DIAGNOSIS — G243 Spasmodic torticollis: Secondary | ICD-10-CM

## 2012-02-19 NOTE — Progress Notes (Signed)
Subjective:   [name] was seen in consultation in the movement disorder clinic for f/u on cervicaldystonia.  She is accompanied by her granddaughter, [name], who supplements the hx.  The patient is a 79 y.o. right handed female with a history of tremor.  The tremor has been present for at least 10 years.  The tremor is only present in the head.  The patient reports that she has had tremor for years but it did not bother her until recently.  She saw herself in a video recently, realized the tremor was worse than she thought and now she would like some treatment.  There is a hx of tremor in her mother and her mothers sister, but it was "slight."  The patient has some neck pain but was told it was due to osteoporosis.  She is able to turn head to drive.  She believes that it shakes in the "no" direction.    Her granddaughter noticed this past week that it was so bad that they thought something serious was wrong.  The patient has taken a trip to [place] since last visit.    Current/Previously tried tremor medications: none  Current medications that may exacerbate tremor:  none   Allergies  Allergen Reactions  . Sulfonamide Derivatives     REACTION: rash and itching    Current Outpatient Prescriptions on File Prior to Visit  Medication Sig Dispense Refill  . ALPRAZolam (XANAX) 0.25 MG tablet Take 1 tablet (0.25 mg total) by mouth 2 (two) times daily as needed for sleep.  20 tablet  0  . aspirin (ASPIR-81) 81 MG EC tablet Take 81 mg by mouth daily.        . fluticasone (FLONASE) 50 MCG/ACT nasal spray Place 2 sprays into the nose daily.  16 g  6  . furosemide (LASIX) 20 MG tablet Take 20 mg by mouth every other day.      . isosorbide mononitrate (IMDUR) 30 MG 24 hr tablet Take 1.5 tablets (45 mg total) by mouth every morning.  45 tablet  11  . levothyroxine (SYNTHROID, LEVOTHROID) 125 MCG tablet Take 1 tablet (125 mcg total) by mouth daily.  30 tablet  6  . metoprolol succinate (TOPROL-XL)  50 MG 24 hr tablet Take 50 mg by mouth 2 (two) times daily.      . nitroGLYCERIN (NITROSTAT) 0.4 MG SL tablet Place 0.4 mg under the tongue every 5 (five) minutes as needed. At the onset of chest pain, Up to 3 doses      . pravastatin (PRAVACHOL) 20 MG tablet Take 1 tablet (20 mg total) by mouth at bedtime.  30 tablet  6  . Psyllium (METAFIBER) 48.57 % POWD Take 15 mLs by mouth at bedtime.       . ramipril (ALTACE) 5 MG capsule Take 1 capsule (5 mg total) by mouth every morning.  30 capsule  5   No current facility-administered medications on file prior to visit.    Past Medical History  Diagnosis Date  . Chest pain 12/09    recent hospitalization for it. felt to be noncardiac.   [name] CAD (coronary artery disease)     a. s/p prior anterior wall myocardial infarction and multiple percutaneous coronary intervention.;  b. Aden MV 1/14:  low risk, inf and apical defect likely due to atten artifact, no ischemia, EF 35% (visually normal and est 55-60%)  . Ischemic cardiomyopathy     EF 25-30% improved to 45% with biventricular pacing.   [name]  Systolic heart failure     improved to class I to II   . Automatic implantable cardioverter/defibrillator (AICD) activation     s/p implantable cardioverter-defibrillator bivenrticular pacer. 12/05. St, Jude [employer]. remote-no.  [name] HTN (hypertension)   . Hyperlipidemia   . GERD (gastroesophageal reflux disease)   . Esophageal stricture     last dialated 2007  . Hiatal hernia   . DD (diverticular disease)     Hx of it - severe. Left colon 2004  . Other urinary problems     urinary incontenance  . UTI (lower urinary tract infection)     recurrent. (Klebsiella pneumoniae). Last Cx 02/03/09. (R-ancef, nitrofurantoin, Augmentin, Zyosyn)  . Arthritis   . PVT (paroxysmal ventricular tachycardia)     Past Surgical History  Procedure Laterality Date  . Angioplasty      stent  . Unspecified area hysterectomy    . Tubal ligation    . Appendectomy    . Cardiac  defibrillator placement      [facility]  . Cataract extraction, bilateral    . Total knee arthroplasty      bilateral    History   Social History  . Marital Status: Widowed    Spouse Name: N/A    Number of Children: N/A  . Years of Education: N/A   Occupational History  . Not on file.   Social History Main Topics  . Smoking status: Never Smoker   . Smokeless tobacco: Never Used  . Alcohol Use: No  . Drug Use: No  . Sexually Active: Not on file   Other Topics Concern  . Not on file   Social History Narrative   Widowed, husband died of brain tumor. Son lives in [place], [place]- was in a MVA for DWI. He has had a long hx of alcohol problems, she has helped him out financially in the past but is ready to be much stricter. Pt. Has a daughter who live in [place] with Breast Ca. She also lost a son to a brain tumor.     Family Status  Relation Status Death Age  . Mother Deceased     MI  . Father Deceased     MI  . Sister Deceased     2, CAD  . Brother Deceased     3, trauma, CAD, CA  . Sister Alive     2,   . Brother Alive     2  . Child Deceased     1, CA (lung)  . Child Alive     3, Rh Factor, Breast CA    Review of Systems A complete 10 system ROS was obtained and was negative apart from what is mentioned.   Objective:   VITALS:   Filed Vitals:   02/19/12 1101  BP: 118/64  Pulse: 84  Temp: 97.8 F (36.6 C)  Resp: 12  Height: 4\' 11"  (1.499 m)  Weight: 132 lb (59.875 kg)   Gen:  Appears stated age and in NAD. HEENT:  Normocephalic, atraumatic. The mucous membranes are moist. The superficial temporal arteries are without ropiness or tenderness. Cardiovascular: Regular rate and rhythm. Lungs: Clear to auscultation bilaterally. Neck: There are no carotid bruits noted bilaterally.  NEUROLOGICAL:  Orientation:  The patient is alert and oriented x 3.  Recent and remote memory are intact.  Attention span and concentration are normal.  Able to name objects and  repeat without trouble.  Fund of knowledge is appropriate Cranial nerves: There  is good facial symmetry. The pupils are equal round and reactive to light bilaterally.  Fundoscopic exam is attempted but the disc margins are not well visualized bilaterally.  . Extraocular muscles are intact and visual fields are full to confrontational testing. Speech is fluent and clear. Soft palate rises symmetrically and there is no tongue deviation. Hearing is intact to conversational tone. Tone: Tone is good throughout.  No rigidity is noted. Sensation: Sensation is intact to light touch throughout (facial, trunk, extremities). Vibration is intact at the bilateral big ankle. There is no extinction with double simultaneous stimulation. There is no sensory dermatomal level identified. Coordination:  The patient has no dysdiadichokinesia or dysmetria. Motor: Strength is 5/5 in the bilateral upper and lower extremities.  Shoulder shrug is equal bilaterally.  There is no pronator drift.  There are no fasciculations noted. DTR's: Deep tendon reflexes are 1/4 at the bilateral biceps, triceps, brachioradialis, patella and achilles.  Plantar responses are downgoing bilaterally. Gait and Station: The patient has a stooped posture and because of that is somewhat off balance. MOVEMENT EXAM: Tremor:  There is no hand tremor present.  There is very intermittent head tremor again today (the patient states that it gets significantly worse at times).  There is hypertrophy of the right sternocleidomastoid muscle and there is significant limitation with neck extension.  There is slight shoulder elevation on the left and the head is sidebent to the L today.     Assessment:   Cervical dystonia.  The patient and I talked about the diagnosis.  We talked about various treatment options, which includes not doing anything at all.  We talked about the fact that all medications do not work well for the treatment of this.  Plan:   After a  long discussion, including the black blox warning, she decided that she would like to try and pursue Botox injections.  Risks, benefits, side effects and alternative therapies were discussed.  The opportunity to ask questions was given and they were answered to the best of my ability.  The patient expressed understanding and willingness to follow the outlined treatment protocols.  We had prior auth but the codes changed from 12/15/13to Jan 2014 so we will need to reauthorize.  I am hopeful that we will be able to inject next week.  I think that her head tremor may involve both sternocleidomastoid muscles, but the right seems more hypertrophied so we are going to start with that as well as the left levator scapulae.  We talked about risks of dysphagia and head "heaviness."  Understanding is expressed.  She wants to wait until after a January vacation to start this, and I certainly have no objection to that.

## 2012-02-19 NOTE — Patient Instructions (Addendum)
Please call next Wednesday regarding your Botox appointment.

## 2012-02-21 ENCOUNTER — Ambulatory Visit: Payer: Medicare Other | Admitting: Neurology

## 2012-02-27 DIAGNOSIS — H40019 Open angle with borderline findings, low risk, unspecified eye: Secondary | ICD-10-CM | POA: Diagnosis not present

## 2012-02-27 DIAGNOSIS — H35039 Hypertensive retinopathy, unspecified eye: Secondary | ICD-10-CM | POA: Diagnosis not present

## 2012-02-27 DIAGNOSIS — H35369 Drusen (degenerative) of macula, unspecified eye: Secondary | ICD-10-CM | POA: Diagnosis not present

## 2012-02-27 DIAGNOSIS — Z961 Presence of intraocular lens: Secondary | ICD-10-CM | POA: Diagnosis not present

## 2012-02-29 ENCOUNTER — Ambulatory Visit (INDEPENDENT_AMBULATORY_CARE_PROVIDER_SITE_OTHER): Payer: Medicare Other | Admitting: Internal Medicine

## 2012-02-29 ENCOUNTER — Ambulatory Visit (INDEPENDENT_AMBULATORY_CARE_PROVIDER_SITE_OTHER): Payer: Medicare Other | Admitting: Neurology

## 2012-02-29 ENCOUNTER — Encounter: Payer: Self-pay | Admitting: Neurology

## 2012-02-29 ENCOUNTER — Encounter: Payer: Self-pay | Admitting: Internal Medicine

## 2012-02-29 VITALS — BP 113/65 | HR 79 | Ht 59.0 in | Wt 128.8 lb

## 2012-02-29 VITALS — BP 116/64 | HR 80 | Temp 98.0°F | Resp 20 | Wt 129.0 lb

## 2012-02-29 DIAGNOSIS — I472 Ventricular tachycardia: Secondary | ICD-10-CM | POA: Diagnosis not present

## 2012-02-29 DIAGNOSIS — T82198A Other mechanical complication of other cardiac electronic device, initial encounter: Secondary | ICD-10-CM | POA: Diagnosis not present

## 2012-02-29 DIAGNOSIS — I509 Heart failure, unspecified: Secondary | ICD-10-CM

## 2012-02-29 DIAGNOSIS — I2589 Other forms of chronic ischemic heart disease: Secondary | ICD-10-CM

## 2012-02-29 DIAGNOSIS — Z95810 Presence of automatic (implantable) cardiac defibrillator: Secondary | ICD-10-CM | POA: Diagnosis not present

## 2012-02-29 DIAGNOSIS — G243 Spasmodic torticollis: Secondary | ICD-10-CM | POA: Diagnosis not present

## 2012-02-29 DIAGNOSIS — Z01810 Encounter for preprocedural cardiovascular examination: Secondary | ICD-10-CM

## 2012-02-29 LAB — ICD DEVICE OBSERVATION
AL AMPLITUDE: 4.8 mv
AL IMPEDENCE ICD: 425 Ohm
BAMS-0001: 150 {beats}/min
BAMS-0003: 70 {beats}/min
CHARGE TIME: 9.5 s
DEVICE MODEL ICD: 815822
HV IMPEDENCE: 40 Ohm
LV LEAD IMPEDENCE ICD: 562.5 Ohm
LV LEAD THRESHOLD: 1.75 V
RV LEAD THRESHOLD: 1.875 V
TOT-0006: 20111109000000
TOT-0007: 0
TOT-0008: 0
TZAT-0004FASTVT: 8
TZAT-0004SLOWVT: 8
TZAT-0012FASTVT: 200 ms
TZAT-0013FASTVT: 1
TZAT-0018FASTVT: NEGATIVE
TZAT-0018SLOWVT: NEGATIVE
TZAT-0019SLOWVT: 7.5 V
TZON-0003FASTVT: 300 ms
TZON-0005SLOWVT: 6
TZON-0010SLOWVT: 40 ms
TZST-0001FASTVT: 3
TZST-0001FASTVT: 5
TZST-0001SLOWVT: 3
TZST-0003FASTVT: 36 J
TZST-0003FASTVT: 40 J
TZST-0003FASTVT: 40 J
TZST-0003SLOWVT: 30 J
TZST-0003SLOWVT: 40 J
VENTRICULAR PACING ICD: 94 pct
VF: 2

## 2012-02-29 MED ORDER — ONABOTULINUMTOXINA 100 UNITS IJ SOLR
100.0000 [IU] | Freq: Once | INTRAMUSCULAR | Status: AC
  Administered 2012-02-29: 100 [IU] via INTRAMUSCULAR

## 2012-02-29 NOTE — Patient Instructions (Signed)

## 2012-02-29 NOTE — Progress Notes (Signed)
[facility]um [facility]   Procedure Note Botox  Attending: Dr. Lurena Joiner Tat  Preoperative Diagnosis(es): Cervical Dystonia  Result History  Onset of effect: n/a  Duration of Benefit: n/a  Adverse Effects: n/a   Consent obtained from: The patient Benefits discussed included, but were not limited to decreased muscle tightness, increased joint range of motion, and decreased pain.  Risk discussed included, but were not limited pain and discomfort, bleeding, bruising, excessive weakness, venous thrombosis, muscle atrophy and dysphagia.  A copy of the patient medication guide was given to the patient which explains the blackbox warning.  Patients identity and treatment sites confirmed yes.  Examination:  Prior to the procedure, the pt was reexamined.  Today, her head was most definitely turned to the R, but the head remained held in flexion.  I felt confident that the L SCM was a major contributor but will do the R SCM as well because of the head flexion.  The R splenius capitus may need to be added in the future.  Details of Procedure: Skin was cleaned with alcohol.  A 30 gauge, 1/2 inch needle was introduced to the target muscles. Prior to injection, the needle plunger was aspirated to make sure the needle was not within a blood vessel.  There was no blood retrieved on aspiration.    Following is a summary of the muscles injected  And the amount of Botulinum toxin used:   Dilution 0.9% preservative free saline mixed with 100 u Botox type A to make 10 U per 0.1cc  Injections  Location Left  Right Units Number of sites        Sternocleidomastoid 60 40 100   Splenius Capitus, posterior approach      Splenius Capitus, lateral approach      Levator Scapulae      Trapezius            TOTAL UNITS:   100    Agent: Botulinum Type A ( Onobotulinum Toxin type A ).  1 vials of Botox were used, each containing 100 units and freshly diluted with 1 mL of sterile, non-preserved saline   Total injected  (Units): 100  Total wasted (Units): none wasted   Pt tolerated procedure well without complications.   Reinjection is anticipated in 3 months.  Reassessment will be in 4 weeks.

## 2012-02-29 NOTE — Assessment & Plan Note (Signed)
The patient desires orthopedic procedures. Functionally, she was stable to undergo those from a cardiovascular point of view. I have reviewed with her that these are big surgeries. We will be available as needed.

## 2012-02-29 NOTE — Assessment & Plan Note (Signed)
Lead stable

## 2012-02-29 NOTE — Assessment & Plan Note (Signed)
Continue current medications. Myoview was negative.

## 2012-02-29 NOTE — Progress Notes (Signed)
Patient Care Team: Sheliah Hatch, MD as PCP - General (Family Medicine)   HPI  [name] is a 79 y.o. female Seen in followup for coronary artery disease with multiple prior PCI and congestive heart failure. She has an ischemic cardiomyopathy and is status post CRT-D implantation with interval improvement. Generator replacement in November 2011.  She has a RIATA 1500 lead in place    Last [facility] 10/05: RCA stent site patent, PL branch of the RCA 50%, dCFX 80% treated with Taxus DES Nuclear study a 12/10: No scar or ischemia, EF 54%. Repeat nuclear study 1/14 because of chest pain demonstrated no significant ischemia. Ejection fraction is measured at 35% although visualization thought to be about 55-60% with normal wall motion  She recently returned from [place] were climbed 150 steps albeit slowly.  No chest pains. There is no edema. She is interested in having surgery on her hammertoe, hip and knee.   Past Medical History  Diagnosis Date  . Chest pain 12/09    recent hospitalization for it. felt to be noncardiac.   [name] CAD (coronary artery disease)     a. s/p prior anterior wall myocardial infarction and multiple percutaneous coronary intervention.;  b. Aden MV 1/14:  low risk, inf and apical defect likely due to atten artifact, no ischemia, EF 35% (visually normal and est 55-60%)  . Ischemic cardiomyopathy     EF 25-30% improved to 45% with biventricular pacing.   . Systolic heart failure     improved to class I to II   . Automatic implantable cardioverter/defibrillator (AICD) activation     s/p implantable cardioverter-defibrillator bivenrticular pacer. 12/05. St, Jude Medtronic. remote-no.  [name] HTN (hypertension)   . Hyperlipidemia   . GERD (gastroesophageal reflux disease)   . Esophageal stricture     last dialated 2007  . Hiatal hernia   . DD (diverticular disease)     Hx of it - severe. Left colon 2004  . Other urinary problems     urinary incontenance  . UTI  (lower urinary tract infection)     recurrent. (Klebsiella pneumoniae). Last Cx 02/03/09. (R-ancef, nitrofurantoin, Augmentin, Zyosyn)  . Arthritis   . PVT (paroxysmal ventricular tachycardia)     Past Surgical History  Procedure Laterality Date  . Angioplasty      stent  . Unspecified area hysterectomy    . Tubal ligation    . Appendectomy    . Cardiac defibrillator placement      [facility]  . Cataract extraction, bilateral    . Total knee arthroplasty      bilateral    Current Outpatient Prescriptions  Medication Sig Dispense Refill  . ALPRAZolam (XANAX) 0.25 MG tablet Take 1 tablet (0.25 mg total) by mouth 2 (two) times daily as needed for sleep.  20 tablet  0  . aspirin (ASPIR-81) 81 MG EC tablet Take 81 mg by mouth daily.        . fluticasone (FLONASE) 50 MCG/ACT nasal spray Place 2 sprays into the nose daily.  16 g  6  . furosemide (LASIX) 20 MG tablet Take 20 mg by mouth every other day.      . isosorbide mononitrate (IMDUR) 30 MG 24 hr tablet Take 1.5 tablets (45 mg total) by mouth every morning.  45 tablet  11  . levothyroxine (SYNTHROID, LEVOTHROID) 125 MCG tablet Take 1 tablet (125 mcg total) by mouth daily.  30 tablet  6  . metoprolol succinate (TOPROL-XL) 50  MG 24 hr tablet Take 50 mg by mouth 2 (two) times daily.      . nitroGLYCERIN (NITROSTAT) 0.4 MG SL tablet Place 0.4 mg under the tongue every 5 (five) minutes as needed. At the onset of chest pain, Up to 3 doses      . pravastatin (PRAVACHOL) 20 MG tablet Take 1 tablet (20 mg total) by mouth at bedtime.  30 tablet  6  . Psyllium (METAFIBER) 48.57 % POWD Take 15 mLs by mouth at bedtime.       . ramipril (ALTACE) 5 MG capsule Take 1 capsule (5 mg total) by mouth every morning.  30 capsule  5   No current facility-administered medications for this visit.    Allergies  Allergen Reactions  . Sulfonamide Derivatives     REACTION: rash and itching    Review of Systems negative except from HPI and  PMH  Physical Exam BP 113/65  Pulse 79  Ht 4\' 11"  (1.499 m)  Wt 128 lb 12.8 oz (58.423 kg)  BMI 26 kg/m2  Well developed and well nourished in no acute distress HENT normal E scleral and icterus clear Neck Supple JVP flat; carotids brisk and full Clear to ausculation  Regular rate and rhythm, no murmurs gallops or rub Soft with active bowel sounds No clubbing cyanosis none Edema Alert and oriented, grossly normal motor and sensory function Skin Warm and Dry    Assessment and  Plan

## 2012-02-29 NOTE — Assessment & Plan Note (Signed)
No intercurrent VT 

## 2012-03-03 ENCOUNTER — Other Ambulatory Visit: Payer: Self-pay | Admitting: Family Medicine

## 2012-03-03 DIAGNOSIS — I10 Essential (primary) hypertension: Secondary | ICD-10-CM

## 2012-03-04 NOTE — Telephone Encounter (Signed)
Refill for Toprol-XL sent to [facility] on [address]

## 2012-03-05 ENCOUNTER — Encounter: Payer: Self-pay | Admitting: Lab

## 2012-03-06 ENCOUNTER — Encounter: Payer: Self-pay | Admitting: Family Medicine

## 2012-03-06 ENCOUNTER — Ambulatory Visit (INDEPENDENT_AMBULATORY_CARE_PROVIDER_SITE_OTHER): Payer: Medicare Other | Admitting: Family Medicine

## 2012-03-06 VITALS — BP 140/80 | HR 89 | Temp 98.2°F | Ht 59.75 in | Wt 131.0 lb

## 2012-03-06 DIAGNOSIS — M2041 Other hammer toe(s) (acquired), right foot: Secondary | ICD-10-CM

## 2012-03-06 DIAGNOSIS — M25559 Pain in unspecified hip: Secondary | ICD-10-CM | POA: Diagnosis not present

## 2012-03-06 DIAGNOSIS — M2040 Other hammer toe(s) (acquired), unspecified foot: Secondary | ICD-10-CM | POA: Diagnosis not present

## 2012-03-06 DIAGNOSIS — M25551 Pain in right hip: Secondary | ICD-10-CM

## 2012-03-06 NOTE — Assessment & Plan Note (Signed)
New.  Pt prefers to see ortho rather than podiatry.  Referral made

## 2012-03-06 NOTE — Patient Instructions (Addendum)
Start tylenol arthritis 3x/day for the pain in your hip We'll call you with your ortho appt Call with any questions or concerns Happy Early Birthday!!!

## 2012-03-06 NOTE — Progress Notes (Signed)
  Subjective:    Patient ID: [name], female    DOB: Aug 11, 1921, 79 y.o.   MRN: [id number]  HPI Hip pain- R hip pain, radiating into groin.  Has been present 'for years' but recently worsened on trip to [facility] w/ excessive walking and stair climbing.  Now hurting at night.  Ambulating w/ cane or walker.  Denies weakness.  Not currently taking anything for pain.  Toe pain- pt has hammer toes bilaterally, R>L.  Now having discomfort wearing shoes.  Wants to see ortho rather than podiatry.   Review of Systems For ROS see HPI     Objective:   Physical Exam  Vitals reviewed. Constitutional: She is oriented to person, place, and time. She appears well-developed and well-nourished. No distress.  Musculoskeletal:  Ambulating w/ cane Acquired hammertoes bilaterally, 2nd-3rd digits, rigid R hip pain over greater trochanter and radiating into groin  Neurological: She is alert and oriented to person, place, and time.  + parkinsonian tremor          Assessment & Plan:

## 2012-03-06 NOTE — Assessment & Plan Note (Signed)
New.  Suspect degenerative changes.  Start Tylenol arthritis prn.  Refer to ortho for complete evaluation

## 2012-03-08 ENCOUNTER — Other Ambulatory Visit: Payer: Self-pay | Admitting: Family Medicine

## 2012-03-11 ENCOUNTER — Ambulatory Visit (INDEPENDENT_AMBULATORY_CARE_PROVIDER_SITE_OTHER): Payer: Medicare Other | Admitting: Family Medicine

## 2012-03-11 ENCOUNTER — Encounter: Payer: Self-pay | Admitting: Family Medicine

## 2012-03-11 VITALS — BP 100/60 | HR 83 | Temp 98.3°F | Wt 129.4 lb

## 2012-03-11 DIAGNOSIS — K222 Esophageal obstruction: Secondary | ICD-10-CM | POA: Diagnosis not present

## 2012-03-11 DIAGNOSIS — J029 Acute pharyngitis, unspecified: Secondary | ICD-10-CM | POA: Diagnosis not present

## 2012-03-11 DIAGNOSIS — M810 Age-related osteoporosis without current pathological fracture: Secondary | ICD-10-CM

## 2012-03-11 DIAGNOSIS — R319 Hematuria, unspecified: Secondary | ICD-10-CM | POA: Diagnosis not present

## 2012-03-11 DIAGNOSIS — J0110 Acute frontal sinusitis, unspecified: Secondary | ICD-10-CM | POA: Diagnosis not present

## 2012-03-11 LAB — POCT URINALYSIS DIPSTICK
Bilirubin, UA: NEGATIVE
Ketones, UA: NEGATIVE
Leukocytes, UA: NEGATIVE
Nitrite, UA: NEGATIVE
Protein, UA: NEGATIVE
pH, UA: 6

## 2012-03-11 MED ORDER — CEFUROXIME AXETIL 500 MG PO TABS: 500.0000 mg | ORAL_TABLET | Freq: Two times a day (BID) | ORAL | Status: AC

## 2012-03-11 NOTE — Progress Notes (Signed)
  Subjective:     [name] is a 79 y.o. female who presents for evaluation of sore throat. Associated symptoms include chills, headache, post nasal drip and sore throat. Onset of symptoms was 2 days ago, and have been gradually worsening since that time. She is drinking plenty of fluids. She has not had a recent close exposure to someone with proven streptococcal pharyngitis.  The following portions of the patient's history were reviewed and updated as appropriate: allergies, current medications, past family history, past medical history, past social history, past surgical history and problem list.  Review of Systems Pertinent items are noted in HPI.    Objective:    BP 100/60  Pulse 83  Temp(Src) 98.3 F (36.8 C) (Oral)  Wt 129 lb 6.4 oz (58.695 kg)  BMI 25.47 kg/m2  SpO2 95% General appearance: alert, cooperative and no distress Ears: normal TM's and external ear canals both ears Nose: yellow discharge, moderate congestion, turbinates red, swollen, sinus tenderness bilateral Throat: abnormal findings: mild oropharyngeal erythema and pnd Neck: mild anterior cervical adenopathy, supple, symmetrical, trachea midline and thyroid not enlarged, symmetric, no tenderness/mass/nodules Lungs: clear to auscultation bilaterally Heart: S1, S2 normal  Laboratory Strep test not done. Results:negative.    Assessment:  1  Acute pharyngitis, likely  Sinusitis with post nasal drip.   2 gerd with stricture---- start on prevacid again and f/u GI--may be cause of #1 3. Osteoporosis--- check bmd and pt is interesed in prolia or reclast if she is a good candidate Plan:    Patient placed on antibiotics. Follow up as needed.    Subjective:     [name] is a 79 y.o. female who presents for evaluation of symptoms of a URI. Symptoms include congestion, sore throat and chills. Onset of symptoms was several days ago, and has been gradually worsening since that time. Treatment to date:  none.  She started with sore throat at T day.  She has been taking claritin daily.  She also wonders if she needs to see GI for stricture.  The following portions of the patient's history were reviewed and updated as appropriate: allergies, current medications, past family history, past medical history, past social history, past surgical history and problem list.  Review of Systems Pertinent items are noted in HPI.   Objective:    BP 100/60  Pulse 83  Temp(Src) 98.3 F (36.8 C) (Oral)  Wt 129 lb 6.4 oz (58.695 kg)  BMI 25.47 kg/m2  SpO2 95% General appearance: alert, cooperative, appears stated age and no distress Nose: Nares normal. Septum midline. Mucosa normal. No drainage or sinus tenderness. Throat: abnormal findings: mild oropharyngeal erythema and pnd Neck: mild anterior cervical adenopathy, supple, symmetrical, trachea midline and thyroid not enlarged, symmetric, no tenderness/mass/nodules Lungs: clear to auscultation bilaterally Heart: S1, S2 normal   Assessment:    viral upper respiratory illness   Plan:    Discussed the diagnosis and treatment of sinusitis. Suggested symptomatic OTC remedies. Nasal saline spray for congestion. Ceftin per orders. Follow up as needed.

## 2012-03-11 NOTE — Patient Instructions (Addendum)

## 2012-03-12 LAB — CBC WITH DIFFERENTIAL/PLATELET
Eosinophils Relative: 2.1 % (ref 0.0–5.0)
HCT: 38 % (ref 36.0–46.0)
Hemoglobin: 12.9 g/dL (ref 12.0–15.0)
Lymphocytes Relative: 14.7 % (ref 12.0–46.0)
Lymphs Abs: 1.7 10*3/uL (ref 0.7–4.0)
Monocytes Relative: 8.3 % (ref 3.0–12.0)
Neutro Abs: 8.3 10*3/uL — ABNORMAL HIGH (ref 1.4–7.7)
RBC: 4.27 Mil/uL (ref 3.87–5.11)
WBC: 11.2 10*3/uL — ABNORMAL HIGH (ref 4.5–10.5)

## 2012-03-12 LAB — BASIC METABOLIC PANEL
GFR: 77.36 mL/min (ref >60.00)
Glucose, Bld: 91 mg/dL (ref 70–99)
Potassium: 4.2 mEq/L (ref 3.5–5.1)
Sodium: 137 mEq/L (ref 135–145)

## 2012-03-12 LAB — HEPATIC FUNCTION PANEL
AST: 23 U/L (ref 0–37)
Albumin: 3.7 g/dL (ref 3.5–5.2)
Alkaline Phosphatase: 92 U/L (ref 39–117)
Total Bilirubin: 1.1 mg/dL (ref 0.3–1.2)

## 2012-03-13 DIAGNOSIS — M7750 Other enthesopathy of unspecified foot: Secondary | ICD-10-CM | POA: Diagnosis not present

## 2012-03-13 LAB — URINE CULTURE: Colony Count: >=100000

## 2012-03-14 ENCOUNTER — Other Ambulatory Visit: Payer: Self-pay | Admitting: Family Medicine

## 2012-03-17 NOTE — Telephone Encounter (Signed)
Please advise on RF request.  The med has been discontinued.//AB/CMA

## 2012-03-19 ENCOUNTER — Telehealth: Payer: Self-pay | Admitting: *Deleted

## 2012-03-19 NOTE — Telephone Encounter (Signed)
Spoke with patient, states she is feeling better. Advised if she started feeling bad again to call the office to been seen again.

## 2012-03-28 ENCOUNTER — Encounter: Payer: Self-pay | Admitting: Neurology

## 2012-03-28 ENCOUNTER — Ambulatory Visit (INDEPENDENT_AMBULATORY_CARE_PROVIDER_SITE_OTHER): Payer: Medicare Other | Admitting: Neurology

## 2012-03-28 ENCOUNTER — Ambulatory Visit (INDEPENDENT_AMBULATORY_CARE_PROVIDER_SITE_OTHER): Payer: Medicare Other | Admitting: Internal Medicine

## 2012-03-28 ENCOUNTER — Encounter: Payer: Self-pay | Admitting: Internal Medicine

## 2012-03-28 VITALS — BP 118/64 | HR 68 | Temp 97.4°F | Resp 16 | Wt 131.0 lb

## 2012-03-28 VITALS — BP 140/80 | HR 69 | Ht 59.0 in | Wt 131.0 lb

## 2012-03-28 DIAGNOSIS — K222 Esophageal obstruction: Secondary | ICD-10-CM

## 2012-03-28 DIAGNOSIS — R1319 Other dysphagia: Secondary | ICD-10-CM | POA: Diagnosis not present

## 2012-03-28 DIAGNOSIS — G243 Spasmodic torticollis: Secondary | ICD-10-CM | POA: Diagnosis not present

## 2012-03-28 NOTE — Progress Notes (Signed)
[name] [birth year] MRN [id number]   History of Present Illness:  This is an 79 year old white female with progressive solid food dysphagia. She has a history of a benign distal esophageal stricture which has been dilated on multiple occasions, including dates in 2004, 2007 and in November 2010 with 14, 15 and 16 mm Savary dilators. She has a 3 cm hiatal hernia and prominent thoracic kyphosis which causes her hiatal hernia to be displaced into her chest. She denies having episodes of choking recently. She denies nocturnal cough or hoarseness. She has been on Prevacid 30 mg daily and follows antireflux measures.   Past Medical History  Diagnosis Date  . Chest pain 12/09    recent hospitalization for it. felt to be noncardiac.   [name] CAD (coronary artery disease)     a. s/p prior anterior wall myocardial infarction and multiple percutaneous coronary intervention.;  b. Aden MV 1/14:  low risk, inf and apical defect likely due to atten artifact, no ischemia, EF 35% (visually normal and est 55-60%)  . Ischemic cardiomyopathy     EF 25-30% improved to 45% with biventricular pacing.   . Systolic heart failure     improved to class I to II   . Automatic implantable cardioverter/defibrillator (AICD) activation     s/p implantable cardioverter-defibrillator bivenrticular pacer. 12/05. St, Jude Medtronic. remote-no.  [name] HTN (hypertension)   . Hyperlipidemia   . GERD (gastroesophageal reflux disease)   . Esophageal stricture     last dialated 2007  . Hiatal hernia   . DD (diverticular disease)     Hx of it - severe. Left colon 2004  . Other urinary problems     urinary incontenance  . UTI (lower urinary tract infection)     recurrent. (Klebsiella pneumoniae). Last Cx 02/03/09. (R-ancef, nitrofurantoin, Augmentin, Zyosyn)  . Arthritis   . PVT (paroxysmal ventricular tachycardia)    Past Surgical History  Procedure Laterality Date  . Angioplasty      stent  . Unspecified area hysterectomy     . Tubal ligation    . Appendectomy    . Cardiac defibrillator placement      [facility]  . Cataract extraction, bilateral    . Total knee arthroplasty      bilateral    reports that she has never smoked. She has never used smokeless tobacco. She reports that she does not drink alcohol or use illicit drugs. family history includes Alcohol abuse in her son; Breast cancer in her daughter; Colon polyps in her sister; Diabetes in her daughter and unspecified family member; Heart disease in her brother, father, mother, and sister; and Prostate cancer in her brother.  There is no history of Colon cancer. Allergies  Allergen Reactions  . Sulfonamide Derivatives     REACTION: rash and itching        Review of Systems: Occasional solid food dysphagia  The remainder of the 10 point ROS is negative except as outlined in H&P   Physical Exam: General appearance  Well developed, in no distress. She ambulates with pain Eyes- non icteric. HEENT nontraumatic, normocephalic. Mouth no lesions, tongue papillated, no cheilosis. Neck supple without adenopathy, thyroid not enlarged, no carotid bruits, no JVD. Lungs Clear to auscultation bilaterally. Prominent kyphosis of the thoracic spine Cor normal S1, normal S2, regular rhythm, no murmur,  quiet precordium. Abdomen: nontender with normoactive bowel sounds. Rectal: Not done. Extremities no pedal edema. Skin no lesions. Neurological alert and oriented x 3. Psychological  normal mood and affect.  Assessment and Plan:  Problem #32 79 year old white female with recurrent distal esophageal stricture. She has undergone periodic dilatations every 3 years or so. After talking to her today, she has decided not to proceed with esophageal dilatation and wait until her symptoms become more bothersome to her. We have discussed antireflux measures and her continuing her Prevacid. I asked her to call us if she decides to go ahead with dilatation. I warned her  again to be very careful chewing her food thoroughly and to always sit in an upright position to avoid choking. I feel that some of her dysphagia may be due to presbyesophagus.   03/28/2012 [name]

## 2012-03-28 NOTE — Progress Notes (Signed)
Subjective:   [name] was seen in consultation in the movement disorder clinic for f/u on cervicaldystonia.    The patient is a 79 y.o. right handed female with a history of tremor.  The tremor has been present for at least 10 years.  The tremor is only present in the head.  The patient reports that she has had tremor for years but it did not bother her until recently.  She saw herself in a video recently, realized the tremor was worse than she thought and now she would like some treatment.  There is a hx of tremor in her mother and her mothers sister, but it was "slight."  The patient has some neck pain but was told it was due to osteoporosis.  She is able to turn head to drive.    09/81/1914  The patient had her first series of Botox injections on February 21.  She states that she has done very well with these injections.  Her granddaughter noticed a significant improvement in head tremor.  She had no side effects, including dysphagia.    Allergies  Allergen Reactions  . Sulfonamide Derivatives     REACTION: rash and itching    Current Outpatient Prescriptions on File Prior to Visit  Medication Sig Dispense Refill  . ALPRAZolam (XANAX) 0.25 MG tablet Take 1 tablet (0.25 mg total) by mouth 2 (two) times daily as needed for sleep.  20 tablet  0  . aspirin (ASPIR-81) 81 MG EC tablet Take 81 mg by mouth daily.        . fluticasone (FLONASE) 50 MCG/ACT nasal spray Place 2 sprays into the nose daily.  16 g  6  . furosemide (LASIX) 20 MG tablet Take 20 mg by mouth every other day.      . isosorbide mononitrate (IMDUR) 30 MG 24 hr tablet Take 1.5 tablets (45 mg total) by mouth every morning.  45 tablet  11  . levothyroxine (SYNTHROID, LEVOTHROID) 125 MCG tablet Take 1 tablet (125 mcg total) by mouth daily.  30 tablet  6  . metoprolol succinate (TOPROL-XL) 50 MG 24 hr tablet Take 50 mg by mouth 2 (two) times daily.      . nitroGLYCERIN (NITROSTAT) 0.4 MG SL tablet Place 0.4 mg under  the tongue every 5 (five) minutes as needed. At the onset of chest pain, Up to 3 doses      . pravastatin (PRAVACHOL) 20 MG tablet Take 1 tablet (20 mg total) by mouth at bedtime.  30 tablet  6  . Psyllium (METAFIBER) 48.57 % POWD Take 15 mLs by mouth at bedtime.       . ramipril (ALTACE) 5 MG capsule Take 1 capsule (5 mg total) by mouth every morning.  30 capsule  5   No current facility-administered medications on file prior to visit.    Past Medical History  Diagnosis Date  . Chest pain 12/09    recent hospitalization for it. felt to be noncardiac.   [name] CAD (coronary artery disease)     a. s/p prior anterior wall myocardial infarction and multiple percutaneous coronary intervention.;  b. Aden MV 1/14:  low risk, inf and apical defect likely due to atten artifact, no ischemia, EF 35% (visually normal and est 55-60%)  . Ischemic cardiomyopathy     EF 25-30% improved to 45% with biventricular pacing.   . Systolic heart failure     improved to class I to II   . Automatic implantable cardioverter/defibrillator (  AICD) activation     s/p implantable cardioverter-defibrillator bivenrticular pacer. 12/05. St, Jude Medtronic. remote-no.  [name] HTN (hypertension)   . Hyperlipidemia   . GERD (gastroesophageal reflux disease)   . Esophageal stricture     last dialated 2007  . Hiatal hernia   . DD (diverticular disease)     Hx of it - severe. Left colon 2004  . Other urinary problems     urinary incontenance  . UTI (lower urinary tract infection)     recurrent. (Klebsiella pneumoniae). Last Cx 02/03/09. (R-ancef, nitrofurantoin, Augmentin, Zyosyn)  . Arthritis   . PVT (paroxysmal ventricular tachycardia)     Past Surgical History  Procedure Laterality Date  . Angioplasty      stent  . Unspecified area hysterectomy    . Tubal ligation    . Appendectomy    . Cardiac defibrillator placement      [facility]  . Cataract extraction, bilateral    . Total knee arthroplasty      bilateral     History   Social History  . Marital Status: Widowed    Spouse Name: N/A    Number of Children: N/A  . Years of Education: N/A   Occupational History  . Not on file.   Social History Main Topics  . Smoking status: Never Smoker   . Smokeless tobacco: Never Used  . Alcohol Use: No  . Drug Use: No  . Sexually Active: Not on file   Other Topics Concern  . Not on file   Social History Narrative   Widowed, husband died of brain tumor. Son lives in [place], [place]- was in a MVA for DWI. He has had a long hx of alcohol problems, she has helped him out financially in the past but is ready to be much stricter. Pt. Has a daughter who live in [place] with Breast Ca. She also lost a son to a brain tumor.     Family Status  Relation Status Death Age  . Mother Deceased     MI  . Father Deceased     MI  . Sister Deceased     2, CAD  . Brother Deceased     3, trauma, CAD, CA  . Sister Alive     2,   . Brother Alive     2  . Child Deceased     1, CA (lung)  . Child Alive     3, Rh Factor, Breast CA    Review of Systems A complete 10 system ROS was obtained and was negative apart from what is mentioned.   Objective:   VITALS:   Filed Vitals:   02/19/12 1101  BP: 118/64  Pulse: 84  Temp: 97.8 F (36.6 C)  Resp: 12  Height: 4\' 11"  (1.499 m)  Weight: 132 lb (59.875 kg)   Gen:  Appears stated age and in NAD. HEENT:  Normocephalic, atraumatic. The mucous membranes are moist. The superficial temporal arteries are without ropiness or tenderness. Cardiovascular: Regular rate and rhythm. Lungs: Clear to auscultation bilaterally. Neck: There are no carotid bruits noted bilaterally.  NEUROLOGICAL:  Orientation:  The patient is alert and oriented x 3.  Recent and remote memory are intact.  Attention span and concentration are normal.  Able to name objects and repeat without trouble.  Fund of knowledge is appropriate Cranial nerves: There is good facial symmetry. The pupils are equal  round and reactive to light bilaterally.  Fundoscopic exam is attempted  but the disc margins are not well visualized bilaterally.  . Extraocular muscles are intact and visual fields are full to confrontational testing. Speech is fluent and clear. Soft palate rises symmetrically and there is no tongue deviation. Hearing is intact to conversational tone. Tone: Tone is good throughout.  No rigidity is noted. Sensation: Sensation is intact to light touch throughout (facial, trunk, extremities). Vibration is intact at the bilateral big ankle. There is no extinction with double simultaneous stimulation. There is no sensory dermatomal level identified. Coordination:  The patient has no dysdiadichokinesia or dysmetria. Motor: Strength is 5/5 in the bilateral upper and lower extremities.  Shoulder shrug is equal bilaterally.  There is no pronator drift.  There are no fasciculations noted. DTR's: Deep tendon reflexes are 1/4 at the bilateral biceps, triceps, brachioradialis, patella and achilles.  Plantar responses are downgoing bilaterally. Gait and Station: The patient has a stooped posture and because of that is somewhat off balance. MOVEMENT EXAM: Tremor:  There is no hand tremor present.  There is very intermittent head tremor again today.  The most prominent thing today is side bending of the head to the left.  There is hypertrophy of the left levator scapulae muscle.     Assessment:   Cervical dystonia.  She is improved after her first series of botox injections. Plan:   After a long discussion, including the black blox warning, she decided that she would like to continue with Botox injections.  Risks, benefits, side effects and alternative therapies were discussed.  The opportunity to ask questions was given and they were answered to the best of my ability.  I will likely need to have the left levator scapulae, and I discussed with her that there may be an increased risk of the dysphagia associated  with that muscle.  She expressed understanding.  We will also try to see if she can get myofascial release after her next Botox injections.  I will see her back in May at the time of injection.

## 2012-03-28 NOTE — Patient Instructions (Addendum)
Please follow up with Dr Juanda Chance as needed.  CC: Dr Neena Rhymes

## 2012-04-14 ENCOUNTER — Other Ambulatory Visit: Payer: Self-pay | Admitting: Family Medicine

## 2012-04-16 ENCOUNTER — Other Ambulatory Visit: Payer: Self-pay | Admitting: Family Medicine

## 2012-04-17 NOTE — Telephone Encounter (Signed)
Pt has Refill on file from 01-21-12 from cardiologist. Spoke to pharmacy can disregard request.

## 2012-04-21 DIAGNOSIS — M899 Disorder of bone, unspecified: Secondary | ICD-10-CM | POA: Diagnosis not present

## 2012-05-13 DIAGNOSIS — M25559 Pain in unspecified hip: Secondary | ICD-10-CM | POA: Diagnosis not present

## 2012-05-23 ENCOUNTER — Ambulatory Visit (INDEPENDENT_AMBULATORY_CARE_PROVIDER_SITE_OTHER): Payer: Medicare Other | Admitting: Neurology

## 2012-05-23 ENCOUNTER — Encounter: Payer: Self-pay | Admitting: Neurology

## 2012-05-23 ENCOUNTER — Telehealth: Payer: Self-pay

## 2012-05-23 VITALS — BP 116/68 | HR 80 | Temp 97.6°F | Resp 18 | Wt 130.0 lb

## 2012-05-23 DIAGNOSIS — G243 Spasmodic torticollis: Secondary | ICD-10-CM | POA: Diagnosis not present

## 2012-05-23 MED ORDER — ONABOTULINUMTOXINA 100 UNITS IJ SOLR
200.0000 [IU] | Freq: Once | INTRAMUSCULAR | Status: AC
  Administered 2012-05-23: 200 [IU] via INTRAMUSCULAR

## 2012-05-23 NOTE — Progress Notes (Signed)
[facility]   Procedure Note Botox  Attending: Dr. Lurena Joiner Tat  Preoperative Diagnosis(es): Cervical Dystonia  Result History  Onset of effect: 10 d Duration of Benefit: still working, pleased with medication.  L ear still pulls to L shoulder.  Adverse Effects: n/a   Consent obtained from: The patient Benefits discussed included, but were not limited to decreased muscle tightness, increased joint range of motion, and decreased pain.  Risk discussed included, but were not limited pain and discomfort, bleeding, bruising, excessive weakness, venous thrombosis, muscle atrophy and dysphagia.  A copy of the patient medication guide was given to the patient which explains the blackbox warning.  Patients identity and treatment sites confirmed yes.  Examination:  Prior to the procedure, the pt was reexamined.  Today, her head was most definitely turned to the R, but the head remained held in flexion.  I felt confident that the L SCM was a major contributor but will do the R SCM as well because of the head flexion.  The R splenius capitus may need to be added in the future.  Details of Procedure: Skin was cleaned with alcohol.  A 30 gauge, 1/2 inch needle was introduced to the target muscles. Prior to injection, the needle plunger was aspirated to make sure the needle was not within a blood vessel.  There was no blood retrieved on aspiration.    Following is a summary of the muscles injected  And the amount of Botulinum toxin used:   Dilution 0.9% preservative free saline mixed with 100 u Botox type A to make 10 U per 0.1cc  Injections  Location Left  Right Units Number of sites        Sternocleidomastoid 60 40 100   Splenius Capitus, posterior approach      Splenius Capitus, lateral approach      Levator Scapulae 30     Trapezius            TOTAL UNITS:   130    Agent: Botulinum Type A ( Onobotulinum Toxin type A ).  1 vials of Botox were used, each containing 100 units and  freshly diluted with 1 mL of sterile, non-preserved saline   Total injected (Units): 100  Total wasted (Units): none wasted   Pt tolerated procedure well without complications.   Reinjection is anticipated in 3 months.  Reassessment will be in 4 weeks.

## 2012-05-23 NOTE — Telephone Encounter (Signed)
BMD received and patient has Osteoporosis. Per Dr.Lowne the patient need Fosamax but due to reflux Prolia or Reclast may be better. Msg left to call and make the patient aware.      KP

## 2012-05-26 ENCOUNTER — Encounter: Payer: Medicare Other | Admitting: *Deleted

## 2012-05-30 ENCOUNTER — Encounter: Payer: Self-pay | Admitting: *Deleted

## 2012-06-03 ENCOUNTER — Ambulatory Visit: Payer: Medicare Other | Admitting: Internal Medicine

## 2012-06-03 DIAGNOSIS — M5030 Other cervical disc degeneration, unspecified cervical region: Secondary | ICD-10-CM | POA: Diagnosis not present

## 2012-06-03 DIAGNOSIS — M999 Biomechanical lesion, unspecified: Secondary | ICD-10-CM | POA: Diagnosis not present

## 2012-06-03 DIAGNOSIS — M9981 Other biomechanical lesions of cervical region: Secondary | ICD-10-CM | POA: Diagnosis not present

## 2012-06-03 DIAGNOSIS — M5137 Other intervertebral disc degeneration, lumbosacral region: Secondary | ICD-10-CM | POA: Diagnosis not present

## 2012-06-04 ENCOUNTER — Encounter: Payer: Self-pay | Admitting: Family Medicine

## 2012-06-04 ENCOUNTER — Encounter: Payer: Self-pay | Admitting: Lab

## 2012-06-04 ENCOUNTER — Ambulatory Visit (INDEPENDENT_AMBULATORY_CARE_PROVIDER_SITE_OTHER): Payer: Medicare Other | Admitting: Family Medicine

## 2012-06-04 VITALS — BP 110/70 | HR 86 | Temp 98.2°F | Ht 59.75 in | Wt 131.4 lb

## 2012-06-04 DIAGNOSIS — W57XXXA Bitten or stung by nonvenomous insect and other nonvenomous arthropods, initial encounter: Secondary | ICD-10-CM

## 2012-06-04 DIAGNOSIS — H04129 Dry eye syndrome of unspecified lacrimal gland: Secondary | ICD-10-CM | POA: Diagnosis not present

## 2012-06-04 DIAGNOSIS — S30860A Insect bite (nonvenomous) of lower back and pelvis, initial encounter: Secondary | ICD-10-CM

## 2012-06-04 DIAGNOSIS — M810 Age-related osteoporosis without current pathological fracture: Secondary | ICD-10-CM | POA: Diagnosis not present

## 2012-06-04 DIAGNOSIS — S20362A Insect bite (nonvenomous) of left front wall of thorax, initial encounter: Secondary | ICD-10-CM | POA: Insufficient documentation

## 2012-06-04 DIAGNOSIS — H40019 Open angle with borderline findings, low risk, unspecified eye: Secondary | ICD-10-CM | POA: Diagnosis not present

## 2012-06-04 NOTE — Patient Instructions (Addendum)
We'll call you with the information about the Reclast once we hear from your insurance company Keep the tick bite clean- if you develop a rash, please call Call with any questions or concerns Have a great summer!!!

## 2012-06-04 NOTE — Assessment & Plan Note (Signed)
New.  Area cleaned w/ H2O2 and using forceps, tick grasped firmly and removed completely.  Area again cleaned w/ H2O2 and then neosporin and bandaid applied.  Since it was a [place] tick, no need to cover for RMSF or Lyme.  Reviewed supportive care and red flags that should prompt return.  Pt expressed understanding and is in agreement w/ plan.

## 2012-06-04 NOTE — Assessment & Plan Note (Signed)
New.  Reviewed recent DEXA.  After discussion of options, pt would like to pursue Reclast if insurance will cover it.  Will attempt to verify insurance benefits.  In interim, pt to take Ca 1200 + Vit D 800 daily.  Pt expressed understanding and is in agreement w/ plan.

## 2012-06-04 NOTE — Progress Notes (Signed)
  Subjective:    Patient ID: [name], female    DOB: 10/02/1921, 79 y.o.   MRN: [id number]  HPI  Tick bite- pt reports she thought she had a scab for a week or so on L anterior shoulder/clavicle but it's a tick.  Yesterday had low grade temp at chiropracter.  Mild HA- 'nothing severe'.  No rash.  No N/V, myalgias  Osteoporosis- pt dx'd on recent DEXA but has not started med.  Aware that there are oral, IV, and injectable options.  Wants to discuss pros/cons of each.   Review of Systems For ROS see HPI     Objective:   Physical Exam  Vitals reviewed. Constitutional: She is oriented to person, place, and time. She appears well-developed and well-nourished. No distress.  Cardiovascular: Intact distal pulses.   Musculoskeletal: She exhibits no edema.  Lymphadenopathy:    She has no cervical adenopathy.  Neurological: She is alert and oriented to person, place, and time.  Skin: Skin is warm and dry. No rash noted. No erythema.  [place] tick embedded in L anterior shoulder/clavicle w/out engorgement, surrounding induration/erythema  Psychiatric: She has a normal mood and affect. Her behavior is normal.          Assessment & Plan:

## 2012-06-05 DIAGNOSIS — M999 Biomechanical lesion, unspecified: Secondary | ICD-10-CM | POA: Diagnosis not present

## 2012-06-05 DIAGNOSIS — M9981 Other biomechanical lesions of cervical region: Secondary | ICD-10-CM | POA: Diagnosis not present

## 2012-06-05 DIAGNOSIS — M5030 Other cervical disc degeneration, unspecified cervical region: Secondary | ICD-10-CM | POA: Diagnosis not present

## 2012-06-05 DIAGNOSIS — M5137 Other intervertebral disc degeneration, lumbosacral region: Secondary | ICD-10-CM | POA: Diagnosis not present

## 2012-06-06 NOTE — Telephone Encounter (Signed)
msg left to call the office     KP 

## 2012-06-09 DIAGNOSIS — M5137 Other intervertebral disc degeneration, lumbosacral region: Secondary | ICD-10-CM | POA: Diagnosis not present

## 2012-06-09 DIAGNOSIS — M999 Biomechanical lesion, unspecified: Secondary | ICD-10-CM | POA: Diagnosis not present

## 2012-06-09 DIAGNOSIS — M5030 Other cervical disc degeneration, unspecified cervical region: Secondary | ICD-10-CM | POA: Diagnosis not present

## 2012-06-09 DIAGNOSIS — M9981 Other biomechanical lesions of cervical region: Secondary | ICD-10-CM | POA: Diagnosis not present

## 2012-06-10 ENCOUNTER — Telehealth: Payer: Self-pay | Admitting: Family Medicine

## 2012-06-10 NOTE — Telephone Encounter (Signed)
Pt got a bill and needs to know what codes were used when billing for a visit- that was over a year ago. [employer] and [employer] state they aren't paying the claims. Daughter [name] called billing office and they directed her back to Tabori's nurse. She is stating she needs to know the code.

## 2012-06-11 DIAGNOSIS — M9981 Other biomechanical lesions of cervical region: Secondary | ICD-10-CM | POA: Diagnosis not present

## 2012-06-11 DIAGNOSIS — M999 Biomechanical lesion, unspecified: Secondary | ICD-10-CM | POA: Diagnosis not present

## 2012-06-11 DIAGNOSIS — M5137 Other intervertebral disc degeneration, lumbosacral region: Secondary | ICD-10-CM | POA: Diagnosis not present

## 2012-06-11 DIAGNOSIS — M5030 Other cervical disc degeneration, unspecified cervical region: Secondary | ICD-10-CM | POA: Diagnosis not present

## 2012-06-12 NOTE — Telephone Encounter (Signed)
Letter mailed     KP 

## 2012-06-16 ENCOUNTER — Telehealth: Payer: Self-pay | Admitting: General Practice

## 2012-06-16 DIAGNOSIS — M999 Biomechanical lesion, unspecified: Secondary | ICD-10-CM | POA: Diagnosis not present

## 2012-06-16 DIAGNOSIS — M9981 Other biomechanical lesions of cervical region: Secondary | ICD-10-CM | POA: Diagnosis not present

## 2012-06-16 DIAGNOSIS — M5030 Other cervical disc degeneration, unspecified cervical region: Secondary | ICD-10-CM | POA: Diagnosis not present

## 2012-06-16 DIAGNOSIS — M5137 Other intervertebral disc degeneration, lumbosacral region: Secondary | ICD-10-CM | POA: Diagnosis not present

## 2012-06-16 DIAGNOSIS — M2040 Other hammer toe(s) (acquired), unspecified foot: Secondary | ICD-10-CM | POA: Diagnosis not present

## 2012-06-16 NOTE — Telephone Encounter (Signed)
Pt called and LMOVM stating that she would like to discuss her recent bone density. Called pt back and LMOVM

## 2012-06-17 NOTE — Telephone Encounter (Signed)
Pt family would like to submit benefits for Reclast. Began on 6/10.

## 2012-06-19 ENCOUNTER — Telehealth: Payer: Self-pay | Admitting: *Deleted

## 2012-06-19 DIAGNOSIS — M5137 Other intervertebral disc degeneration, lumbosacral region: Secondary | ICD-10-CM | POA: Diagnosis not present

## 2012-06-19 DIAGNOSIS — M999 Biomechanical lesion, unspecified: Secondary | ICD-10-CM | POA: Diagnosis not present

## 2012-06-19 DIAGNOSIS — M5030 Other cervical disc degeneration, unspecified cervical region: Secondary | ICD-10-CM | POA: Diagnosis not present

## 2012-06-19 DIAGNOSIS — M9981 Other biomechanical lesions of cervical region: Secondary | ICD-10-CM | POA: Diagnosis not present

## 2012-06-19 NOTE — Telephone Encounter (Signed)
Spoke with the pt and informed her that Dr. Beverely Low cleared her for surgery, but recommend that she have an cardiac clearance by her Cardiologist.  Pt stated okay and she said that she gave [facility] the Cardiologist name and they were to contact them.  Informed the pt she may need to call the Cardiologist herself and let them know Dr. Rennis Golden recommendation.  Pt agreed and said she will call them.//AB/CMA

## 2012-06-19 NOTE — Telephone Encounter (Signed)
Spoke with pt who stated that her daughter takes care of this for her and is not at home at this time. Pt took my name and number and will have her daughter call us back with the date of service and which code she needed.

## 2012-06-20 ENCOUNTER — Telehealth: Payer: Self-pay | Admitting: *Deleted

## 2012-06-20 NOTE — Telephone Encounter (Signed)
Spoke with daughter who was inquiring about cost of Reclast. Advised of information received from Medicare/BCBS HMO Enhanced: $3400 out of pocket maximum, pt has met  $727.14 thus far. Medication does not require a PA with her plan, but will require a $125 co-payment to the facility at the time injection given.

## 2012-06-27 NOTE — Telephone Encounter (Signed)
$  147.00 deductible not covered. $20 percent of medicare approved amount. Medicare will cover all procedure that are deem a medical necessity with proper documentation. Pt need to have labs done.

## 2012-07-04 NOTE — Telephone Encounter (Signed)
Left message to call office to have Pt call office to verify benefits and schedule lab appt for reclast.

## 2012-07-07 ENCOUNTER — Ambulatory Visit (INDEPENDENT_AMBULATORY_CARE_PROVIDER_SITE_OTHER): Payer: Medicare Other | Admitting: *Deleted

## 2012-07-07 ENCOUNTER — Encounter (HOSPITAL_COMMUNITY): Payer: Self-pay | Admitting: Pharmacy Technician

## 2012-07-07 ENCOUNTER — Encounter: Payer: Self-pay | Admitting: Internal Medicine

## 2012-07-07 DIAGNOSIS — I4729 Other ventricular tachycardia: Secondary | ICD-10-CM

## 2012-07-07 DIAGNOSIS — Z95810 Presence of automatic (implantable) cardiac defibrillator: Secondary | ICD-10-CM

## 2012-07-07 DIAGNOSIS — M999 Biomechanical lesion, unspecified: Secondary | ICD-10-CM | POA: Diagnosis not present

## 2012-07-07 DIAGNOSIS — M5137 Other intervertebral disc degeneration, lumbosacral region: Secondary | ICD-10-CM | POA: Diagnosis not present

## 2012-07-07 DIAGNOSIS — I472 Ventricular tachycardia: Secondary | ICD-10-CM | POA: Diagnosis not present

## 2012-07-07 DIAGNOSIS — M9981 Other biomechanical lesions of cervical region: Secondary | ICD-10-CM | POA: Diagnosis not present

## 2012-07-07 DIAGNOSIS — M5030 Other cervical disc degeneration, unspecified cervical region: Secondary | ICD-10-CM | POA: Diagnosis not present

## 2012-07-09 ENCOUNTER — Encounter (HOSPITAL_COMMUNITY)
Admission: RE | Admit: 2012-07-09 | Discharge: 2012-07-09 | Disposition: A | Discharge status: Home / Self Care | Payer: Medicare Other | Source: Ambulatory Visit | Attending: Orthopedic Surgery | Admitting: Orthopedic Surgery

## 2012-07-09 ENCOUNTER — Encounter (HOSPITAL_COMMUNITY): Payer: Self-pay

## 2012-07-09 ENCOUNTER — Encounter (HOSPITAL_COMMUNITY)
Admission: RE | Admit: 2012-07-09 | Discharge: 2012-07-09 | Disposition: A | Discharge status: Home / Self Care | Payer: Medicare Other | Source: Ambulatory Visit | Attending: Anesthesiology | Admitting: Anesthesiology

## 2012-07-09 DIAGNOSIS — E785 Hyperlipidemia, unspecified: Secondary | ICD-10-CM | POA: Diagnosis not present

## 2012-07-09 DIAGNOSIS — I252 Old myocardial infarction: Secondary | ICD-10-CM | POA: Diagnosis not present

## 2012-07-09 DIAGNOSIS — R011 Cardiac murmur, unspecified: Secondary | ICD-10-CM | POA: Diagnosis not present

## 2012-07-09 DIAGNOSIS — K219 Gastro-esophageal reflux disease without esophagitis: Secondary | ICD-10-CM | POA: Diagnosis not present

## 2012-07-09 DIAGNOSIS — Z95810 Presence of automatic (implantable) cardiac defibrillator: Secondary | ICD-10-CM | POA: Diagnosis not present

## 2012-07-09 DIAGNOSIS — F411 Generalized anxiety disorder: Secondary | ICD-10-CM | POA: Diagnosis not present

## 2012-07-09 DIAGNOSIS — M129 Arthropathy, unspecified: Secondary | ICD-10-CM | POA: Diagnosis not present

## 2012-07-09 DIAGNOSIS — I2510 Atherosclerotic heart disease of native coronary artery without angina pectoris: Secondary | ICD-10-CM | POA: Diagnosis not present

## 2012-07-09 DIAGNOSIS — I509 Heart failure, unspecified: Secondary | ICD-10-CM | POA: Diagnosis not present

## 2012-07-09 DIAGNOSIS — Z01818 Encounter for other preprocedural examination: Secondary | ICD-10-CM | POA: Diagnosis not present

## 2012-07-09 DIAGNOSIS — I5022 Chronic systolic (congestive) heart failure: Secondary | ICD-10-CM | POA: Diagnosis not present

## 2012-07-09 DIAGNOSIS — M2040 Other hammer toe(s) (acquired), unspecified foot: Secondary | ICD-10-CM | POA: Diagnosis not present

## 2012-07-09 DIAGNOSIS — I10 Essential (primary) hypertension: Secondary | ICD-10-CM | POA: Diagnosis not present

## 2012-07-09 DIAGNOSIS — I2589 Other forms of chronic ischemic heart disease: Secondary | ICD-10-CM | POA: Diagnosis not present

## 2012-07-09 DIAGNOSIS — Z882 Allergy status to sulfonamides status: Secondary | ICD-10-CM | POA: Diagnosis not present

## 2012-07-09 HISTORY — DX: Presence of cardiac pacemaker: Z95.0

## 2012-07-09 HISTORY — DX: Hypothyroidism, unspecified: E03.9

## 2012-07-09 HISTORY — DX: Anxiety disorder, unspecified: F41.9

## 2012-07-09 LAB — REMOTE ICD DEVICE
AL AMPLITUDE: 3.8 mv
AL IMPEDENCE ICD: 390 Ohm
BAMS-0001: 150 {beats}/min
BRDY-0002RA: 50 {beats}/min
BRDY-0003RA: 120 {beats}/min
MODE SWITCH EPISODES: 17
TZAT-0004FASTVT: 8
TZAT-0004SLOWVT: 8
TZAT-0012FASTVT: 200 ms
TZAT-0013SLOWVT: 4
TZAT-0018FASTVT: NEGATIVE
TZAT-0018SLOWVT: NEGATIVE
TZAT-0019FASTVT: 7.5 V
TZON-0003FASTVT: 300 ms
TZON-0003SLOWVT: 365 ms
TZON-0004FASTVT: 30
TZON-0005FASTVT: 6
TZON-0010FASTVT: 40 ms
TZST-0001FASTVT: 3
TZST-0001FASTVT: 4
TZST-0001SLOWVT: 3
TZST-0001SLOWVT: 5
TZST-0003FASTVT: 40 J
TZST-0003SLOWVT: 30 J
TZST-0003SLOWVT: 40 J

## 2012-07-09 LAB — BASIC METABOLIC PANEL
BUN: 16 mg/dL (ref 6–23)
Chloride: 105 mEq/L (ref 96–112)
Creatinine, Ser: 0.62 mg/dL (ref 0.50–1.10)
Glucose, Bld: 96 mg/dL (ref 70–99)
Potassium: 5.1 mEq/L (ref 3.5–5.1)

## 2012-07-09 LAB — CBC
HCT: 38.1 % (ref 36.0–46.0)
Hemoglobin: 13.2 g/dL (ref 12.0–15.0)
MCH: 30.2 pg (ref 26.0–34.0)
MCHC: 34.6 g/dL (ref 30.0–36.0)
MCV: 87.2 fL (ref 78.0–100.0)
RDW: 12.6 % (ref 11.5–15.5)

## 2012-07-09 NOTE — Progress Notes (Signed)
No orders at preadmit. Office called by ericka -sec 

## 2012-07-09 NOTE — Progress Notes (Signed)
icd orders faxed to dr Graciela Husbands

## 2012-07-09 NOTE — Pre-Procedure Instructions (Addendum)
[name]  07/09/2012   Your procedure is scheduled on:  7.10.14  Report to [facility] at 1015  AM.  Call this number if you have problems the morning of surgery: [phone]   Remember:   Do not eat food or drink liquids after midnight.   Take these medicines the morning of surgery with A SIP OF WATER: xanax, imdur,prevacid, metoprolol  ,synthroid                       STOP aspirin 7.5.14   Do not wear jewelry, make-up or nail polish.  Do not wear lotions, powders, or perfumes. You may wear deodorant.  Do not shave 48 hours prior to surgery. Men may shave face and neck.  Do not bring valuables to the hospital.  [facility] is not responsible                   for any belongings or valuables.  Contacts, dentures or bridgework may not be worn into surgery.  Leave suitcase in the car. After surgery it may be brought to your room.  For patients admitted to the hospital, checkout time is 11:00 AM the day of  discharge.   Patients discharged the day of surgery will not be allowed to drive  home.  Name and phone number of your driver:   Special Instructions: Shower using CHG 2 nights before surgery and the night before surgery.  If you shower the day of surgery use CHG.  Use special wash - you have one bottle of CHG for all showers.  You should use approximately 1/3 of the bottle for each shower.   Please read over the following fact sheets that you were given: Pain Booklet, Coughing and Deep Breathing and Surgical Site Infection Prevention

## 2012-07-10 NOTE — Progress Notes (Signed)
Anesthesia Chart Review:  Patient is an 79 year old female posted for right second and third MT Weil osteotomies with Hammertoe corrections on 07/17/12 by Dr. Victorino Dike.  History includes nonsmoker, CAD s/p RCA '03 and CX '05 stents, ischemic cardiomyopathy, chronic systolic CHF, St. Jude AICD (CRT-D) 11/16/09, paroxysmal ventricular tachycardia, hypertension, hyperlipidemia, GERD, hiatal hernia, esophageal stricture, arthritis, hypothyroidism, anxiety, recurrent UTIs, TKA, tonsillectomy, hysterectomy. PCP is Dr. Beverely Low who medically cleared patient for surgery. Cardiologist Dr. Graciela Husbands also cleared patient for this procedure (see 02/29/12 note).  EKG on 01/21/12 showed NSR, v-paced, cannot rule out anterior infarct (age undetermined).  She had a nuclear stress test on 01/23/12 due to chest pain.  Results demonstrated no significant ischemia (inferior and apical defects felt to be attenuation artifact). Ejection fraction is measured at 35% although visualization thought to be about 55-60% with normal wall motion.  Low risk scan.  Echo on 06/05/06 showed mildly reduced LV systolic function, EF 45%, severe hypokinesis of the inferoposterior wall, mild hypokinesis of the anterior wall, moderate to severe MR,   Last cardiac cath on 10/06//05 showed: RCA stent site patent, PL branch of the RCA 50%, dCFX 80% treated with Taxus DES. Chronically occluded LAD. See full report in Epic.  CXR on 07/09/12 showed chronic elevation of the right hemidiaphragm, thoracic aortic atherosclerotic calcifications, three cardiac device leads, no active disease.  Preoperative labs noted.  She will be evaluated by her assigned anesthesiologist on the day of surgery.  If no acute changes then I would anticipate that she could proceed as planned.  Velna Ochs [facility]/Anesthesiology Phone [phone] 07/10/2012 5:35 PM

## 2012-07-14 DIAGNOSIS — M999 Biomechanical lesion, unspecified: Secondary | ICD-10-CM | POA: Diagnosis not present

## 2012-07-14 DIAGNOSIS — M5030 Other cervical disc degeneration, unspecified cervical region: Secondary | ICD-10-CM | POA: Diagnosis not present

## 2012-07-14 DIAGNOSIS — M9981 Other biomechanical lesions of cervical region: Secondary | ICD-10-CM | POA: Diagnosis not present

## 2012-07-14 DIAGNOSIS — M5137 Other intervertebral disc degeneration, lumbosacral region: Secondary | ICD-10-CM | POA: Diagnosis not present

## 2012-07-14 NOTE — Progress Notes (Signed)
Still no orders in EPIC.  Called Dr. Laverta Baltimore office and spoke with Marchelle Folks.  She will leave a message for Dr. Victorino Dike to put in orders.

## 2012-07-16 ENCOUNTER — Other Ambulatory Visit: Payer: Self-pay | Admitting: Orthopedic Surgery

## 2012-07-16 MED ORDER — CEFAZOLIN SODIUM-DEXTROSE 2-3 GM-% IV SOLR
2.0000 g | INTRAVENOUS | Status: AC
  Administered 2012-07-17: 2 g via INTRAVENOUS
  Filled 2012-07-16: qty 50

## 2012-07-16 NOTE — Progress Notes (Signed)
Pt. Notified of time change for surgery,instructed to arrive at 9:15 AM. Pt. Voices understanding.

## 2012-07-17 ENCOUNTER — Ambulatory Visit (HOSPITAL_COMMUNITY)
Admission: RE | Admit: 2012-07-17 | Discharge: 2012-07-17 | Disposition: A | Discharge status: Home / Self Care | Payer: Medicare Other | Source: Ambulatory Visit | Attending: Orthopedic Surgery | Admitting: Orthopedic Surgery

## 2012-07-17 ENCOUNTER — Encounter: Payer: Self-pay | Admitting: *Deleted

## 2012-07-17 ENCOUNTER — Ambulatory Visit (HOSPITAL_COMMUNITY): Payer: Medicare Other | Admitting: Critical Care Medicine

## 2012-07-17 ENCOUNTER — Encounter (HOSPITAL_COMMUNITY): Payer: Self-pay | Admitting: Surgery

## 2012-07-17 ENCOUNTER — Encounter (HOSPITAL_COMMUNITY)
Admission: RE | Disposition: A | Discharge status: Home / Self Care | Payer: Self-pay | Source: Ambulatory Visit | Attending: Orthopedic Surgery

## 2012-07-17 ENCOUNTER — Encounter (HOSPITAL_COMMUNITY): Payer: Self-pay | Admitting: Vascular Surgery

## 2012-07-17 DIAGNOSIS — Z882 Allergy status to sulfonamides status: Secondary | ICD-10-CM | POA: Insufficient documentation

## 2012-07-17 DIAGNOSIS — I252 Old myocardial infarction: Secondary | ICD-10-CM | POA: Diagnosis not present

## 2012-07-17 DIAGNOSIS — I2589 Other forms of chronic ischemic heart disease: Secondary | ICD-10-CM | POA: Diagnosis not present

## 2012-07-17 DIAGNOSIS — G8918 Other acute postprocedural pain: Secondary | ICD-10-CM | POA: Diagnosis not present

## 2012-07-17 DIAGNOSIS — E785 Hyperlipidemia, unspecified: Secondary | ICD-10-CM | POA: Insufficient documentation

## 2012-07-17 DIAGNOSIS — I2510 Atherosclerotic heart disease of native coronary artery without angina pectoris: Secondary | ICD-10-CM | POA: Insufficient documentation

## 2012-07-17 DIAGNOSIS — F411 Generalized anxiety disorder: Secondary | ICD-10-CM | POA: Insufficient documentation

## 2012-07-17 DIAGNOSIS — I10 Essential (primary) hypertension: Secondary | ICD-10-CM | POA: Insufficient documentation

## 2012-07-17 DIAGNOSIS — M2040 Other hammer toe(s) (acquired), unspecified foot: Secondary | ICD-10-CM | POA: Insufficient documentation

## 2012-07-17 DIAGNOSIS — I5022 Chronic systolic (congestive) heart failure: Secondary | ICD-10-CM | POA: Insufficient documentation

## 2012-07-17 DIAGNOSIS — Z95810 Presence of automatic (implantable) cardiac defibrillator: Secondary | ICD-10-CM | POA: Insufficient documentation

## 2012-07-17 DIAGNOSIS — M2041 Other hammer toe(s) (acquired), right foot: Secondary | ICD-10-CM

## 2012-07-17 DIAGNOSIS — R011 Cardiac murmur, unspecified: Secondary | ICD-10-CM | POA: Insufficient documentation

## 2012-07-17 DIAGNOSIS — I509 Heart failure, unspecified: Secondary | ICD-10-CM | POA: Insufficient documentation

## 2012-07-17 DIAGNOSIS — M129 Arthropathy, unspecified: Secondary | ICD-10-CM | POA: Insufficient documentation

## 2012-07-17 DIAGNOSIS — K219 Gastro-esophageal reflux disease without esophagitis: Secondary | ICD-10-CM | POA: Insufficient documentation

## 2012-07-17 HISTORY — PX: HAMMERTOE RECONSTRUCTION WITH WEIL OSTEOTOMY: SHX5631

## 2012-07-17 SURGERY — HAMMERTOE RECONSTRUCTION WITH WEIL OSTEOTOMY
Anesthesia: Monitor Anesthesia Care | Site: Foot | Laterality: Right | Wound class: Clean

## 2012-07-17 MED ORDER — BACITRACIN ZINC 500 UNIT/GM EX OINT
TOPICAL_OINTMENT | CUTANEOUS | Status: AC
  Filled 2012-07-17: qty 15

## 2012-07-17 MED ORDER — HYDROCODONE-ACETAMINOPHEN 5-325 MG PO TABS
1.0000 | ORAL_TABLET | Freq: Four times a day (QID) | ORAL | Status: DC | PRN
  Administered 2012-07-17: 1 via ORAL

## 2012-07-17 MED ORDER — ONDANSETRON HCL 4 MG/2ML IJ SOLN
INTRAMUSCULAR | Status: DC | PRN
  Administered 2012-07-17: 4 mg via INTRAVENOUS

## 2012-07-17 MED ORDER — HYDROCODONE-ACETAMINOPHEN 5-325 MG PO TABS: 1.0000 | ORAL_TABLET | Freq: Four times a day (QID) | ORAL | Status: DC | PRN

## 2012-07-17 MED ORDER — HYDROCODONE-ACETAMINOPHEN 5-325 MG PO TABS
ORAL_TABLET | ORAL | Status: AC
  Filled 2012-07-17: qty 1

## 2012-07-17 MED ORDER — LACTATED RINGERS IV SOLN
INTRAVENOUS | Status: DC
  Administered 2012-07-17: 11:00:00 via INTRAVENOUS

## 2012-07-17 MED ORDER — SODIUM CHLORIDE 0.9 % IR SOLN
Status: DC | PRN
  Administered 2012-07-17: 1

## 2012-07-17 MED ORDER — SODIUM CHLORIDE 0.9 % IV SOLN: INTRAVENOUS | Status: DC

## 2012-07-17 MED ORDER — CHLORHEXIDINE GLUCONATE 4 % EX LIQD: 60.0000 mL | Freq: Once | CUTANEOUS | Status: DC

## 2012-07-17 MED ORDER — PROPOFOL INFUSION 10 MG/ML OPTIME
INTRAVENOUS | Status: DC | PRN
  Administered 2012-07-17: 50 ug/kg/min via INTRAVENOUS

## 2012-07-17 MED ORDER — BACITRACIN ZINC 500 UNIT/GM EX OINT
TOPICAL_OINTMENT | CUTANEOUS | Status: DC | PRN
  Administered 2012-07-17: 1 via TOPICAL

## 2012-07-17 MED ORDER — FENTANYL CITRATE 0.05 MG/ML IJ SOLN
INTRAMUSCULAR | Status: DC | PRN
  Administered 2012-07-17: 25 ug via INTRAVENOUS
  Administered 2012-07-17: 50 ug via INTRAVENOUS

## 2012-07-17 SURGICAL SUPPLY — 49 items
BANDAGE CONFORM 3  STR LF (GAUZE/BANDAGES/DRESSINGS) ×2 IMPLANT
BANDAGE ELASTIC 4 VELCRO ST LF (GAUZE/BANDAGES/DRESSINGS) ×1 IMPLANT
BLADE AVERAGE 25X9 (BLADE) ×2 IMPLANT
BNDG CMPR 9X4 STRL LF SNTH (GAUZE/BANDAGES/DRESSINGS) ×1
BNDG COHESIVE 4X5 TAN STRL (GAUZE/BANDAGES/DRESSINGS) ×2 IMPLANT
BNDG COHESIVE 6X5 TAN STRL LF (GAUZE/BANDAGES/DRESSINGS) ×1 IMPLANT
BNDG ESMARK 4X9 LF (GAUZE/BANDAGES/DRESSINGS) ×2 IMPLANT
CHLORAPREP W/TINT 26ML (MISCELLANEOUS) ×2 IMPLANT
CLOTH BEACON ORANGE TIMEOUT ST (SAFETY) ×2 IMPLANT
COVER SURGICAL LIGHT HANDLE (MISCELLANEOUS) ×2 IMPLANT
CUFF TOURNIQUET SINGLE 18IN (TOURNIQUET CUFF) IMPLANT
CUFF TOURNIQUET SINGLE 34IN LL (TOURNIQUET CUFF) ×2 IMPLANT
DRAPE OEC MINIVIEW 54X84 (DRAPES) ×2 IMPLANT
DRAPE U-SHAPE 47X51 STRL (DRAPES) ×2 IMPLANT
DRSG EMULSION OIL 3X3 NADH (GAUZE/BANDAGES/DRESSINGS) ×2 IMPLANT
DRSG PAD ABDOMINAL 8X10 ST (GAUZE/BANDAGES/DRESSINGS) ×2 IMPLANT
ELECT REM PT RETURN 9FT ADLT (ELECTROSURGICAL) ×2
ELECTRODE REM PT RTRN 9FT ADLT (ELECTROSURGICAL) ×1 IMPLANT
GLOVE BIO SURGEON STRL SZ8 (GLOVE) ×2 IMPLANT
GLOVE BIOGEL PI IND STRL 6.5 (GLOVE) IMPLANT
GLOVE BIOGEL PI IND STRL 8 (GLOVE) ×1 IMPLANT
GLOVE BIOGEL PI INDICATOR 6.5 (GLOVE) ×1
GLOVE BIOGEL PI INDICATOR 8 (GLOVE) ×1
GOWN PREVENTION PLUS XLARGE (GOWN DISPOSABLE) ×2 IMPLANT
GOWN STRL NON-REIN LRG LVL3 (GOWN DISPOSABLE) ×3 IMPLANT
K-WIRE 102X1.4 (WIRE) ×2 IMPLANT
KIT BASIN OR (CUSTOM PROCEDURE TRAY) ×2 IMPLANT
KIT ROOM TURNOVER OR (KITS) ×2 IMPLANT
MANIFOLD NEPTUNE II (INSTRUMENTS) ×1 IMPLANT
NEEDLE HYPO 22GX1.5 SAFETY (NEEDLE) ×1 IMPLANT
NS IRRIG 1000ML POUR BTL (IV SOLUTION) ×2 IMPLANT
PACK ORTHO EXTREMITY (CUSTOM PROCEDURE TRAY) ×2 IMPLANT
PAD ARMBOARD 7.5X6 YLW CONV (MISCELLANEOUS) ×4 IMPLANT
PAD CAST 4YDX4 CTTN HI CHSV (CAST SUPPLIES) ×1 IMPLANT
PADDING CAST COTTON 4X4 STRL (CAST SUPPLIES)
PENCIL BUTTON HOLSTER BLD 10FT (ELECTRODE) ×1 IMPLANT
SCANLAN PINCAP PIN COVER ×1 IMPLANT
SPONGE GAUZE 4X4 12PLY (GAUZE/BANDAGES/DRESSINGS) ×2 IMPLANT
SUT ETHILON 3 0 FSL (SUTURE) ×1 IMPLANT
SUT ETHILON 3 0 PS 1 (SUTURE) ×2 IMPLANT
SUT MNCRL AB 3-0 PS2 18 (SUTURE) ×2 IMPLANT
SUT VIC AB 2-0 CT1 27 (SUTURE)
SUT VIC AB 2-0 CT1 TAPERPNT 27 (SUTURE) ×1 IMPLANT
SYR CONTROL 10ML LL (SYRINGE) ×2 IMPLANT
TOWEL OR 17X24 6PK STRL BLUE (TOWEL DISPOSABLE) ×2 IMPLANT
TOWEL OR 17X26 10 PK STRL BLUE (TOWEL DISPOSABLE) ×2 IMPLANT
TUBE CONNECTING 20X1/4 (TUBING) ×2 IMPLANT
TWIST OFF SCREW 12MM (Screw) ×2 IMPLANT
UNDERPAD 30X30 INCONTINENT (UNDERPADS AND DIAPERS) ×2 IMPLANT

## 2012-07-17 NOTE — Anesthesia Procedure Notes (Addendum)
Procedure Name: MAC Date/Time: 07/17/2012 11:55 AM Performed by: Elon Alas Pre-anesthesia Checklist: Patient identified, Timeout performed, Emergency Drugs available, Suction available and Patient being monitored Patient Re-evaluated:Patient Re-evaluated prior to inductionOxygen Delivery Method: Simple face mask Intubation Type: IV induction Placement Confirmation: positive ETCO2 and breath sounds checked- equal and bilateral Dental Injury: Teeth and Oropharynx as per pre-operative assessment    Anesthesia Regional Block:  Ankle block  Pre-Anesthetic Checklist: ,, timeout performed, Correct Patient, Correct Site, Correct Laterality, Correct Procedure, Correct Position, site marked, Risks and benefits discussed,  Surgical consent,  Pre-op evaluation,  At surgeon's request and post-op pain management  Laterality: Right     Needles:       Needle Gauge: 25 and 25 G    Additional Needles: Ankle block Narrative:  Start time: 07/17/2012 11:30 AM End time: 07/17/2012 11:45 AM  Performed by: Personally  Anesthesiologist: Dr. Randa Evens

## 2012-07-17 NOTE — Progress Notes (Signed)
darko shoe applied as ordered

## 2012-07-17 NOTE — OR Nursing (Signed)
Esmark tourniquet applied at 12:16. Tourniquet removed at 12:50.  Oralia Manis, RN

## 2012-07-17 NOTE — Brief Op Note (Signed)
07/17/2012  1:00 PM  PATIENT:  [name]  79 y.o. female  PRE-OPERATIVE DIAGNOSIS:  RIGHT SECOND AND THIRD HAMMER TOES  POST-OPERATIVE DIAGNOSIS:  RIGHT SECOND AND THIRD HAMMER TOES  Procedure(s): 1.  Right 2nd MT weil osteotomy 2.  Right 3rd MT weil osteotomy 3.  Right 2nd hammertoe correction (PIP arthrodesis) 4.  Right 3rd hammertoe correction (PIP arthrodesis) 5.  Right 2nd MTPJ dorsal capsulotomy and extensor tendon lengthening 6.  Right 3rd MTPJ dorsal capsulotomy and extensor tendon lengthening 7.  Right foot AP and lateral radiograph  SURGEON:  Toni Arthurs, MD  ASSISTANT: n/a  ANESTHESIA:   Ankle block and IV sedation  EBL:  minimal   TOURNIQUET:  34 min with ankle esmarch  COMPLICATIONS:  None apparent  DISPOSITION:  Extubated, awake and stable to recovery.  DICTATION ID:  [id number]

## 2012-07-17 NOTE — Preoperative (Signed)
Beta Blockers   Reason not to administer Beta Blockers:Not Applicable 

## 2012-07-17 NOTE — Anesthesia Preprocedure Evaluation (Addendum)
Anesthesia Evaluation  Patient identified by MRN, date of birth, ID band Patient awake    Reviewed: Allergy & Precautions, H&P , NPO status , Patient's Chart, lab work & pertinent test results, reviewed documented beta blocker date and time   Airway Mallampati: II TM Distance: >3 FB Neck ROM: Full    Dental  (+) Teeth Intact, Partial Lower and Dental Advisory Given   Pulmonary          Cardiovascular hypertension, Pt. on medications + CAD and +CHF + dysrhythmias Ventricular Tachycardia + pacemaker + Cardiac Defibrillator + Valvular Problems/Murmurs Rhythm:Regular Rate:Normal     Neuro/Psych  Headaches, PSYCHIATRIC DISORDERS Anxiety    GI/Hepatic GERD-  Medicated,  Endo/Other  Hypothyroidism   Renal/GU      Musculoskeletal   Abdominal   Peds  Hematology   Anesthesia Other Findings   Reproductive/Obstetrics                        Anesthesia Physical Anesthesia Plan  ASA: III  Anesthesia Plan: MAC and Regional   Post-op Pain Management:    Induction: Intravenous  Airway Management Planned: Simple Face Mask  Additional Equipment:   Intra-op Plan:   Post-operative Plan: Extubation in OR  Informed Consent: I have reviewed the patients History and Physical, chart, labs and discussed the procedure including the risks, benefits and alternatives for the proposed anesthesia with the patient or authorized representative who has indicated his/her understanding and acceptance.   Dental advisory given  Plan Discussed with: CRNA, Anesthesiologist and Surgeon  Anesthesia Plan Comments:        Anesthesia Quick Evaluation

## 2012-07-17 NOTE — H&P (Signed)
[name] is an 79 y.o. female.   Chief Complaint: right forefoot pain HPI: 79 y/o female with PMH of CAD c/o right forefoot pain for several years.  She has 2nd and 3rd hammertoes that are painful nad have failed non operative treatment.  Past Medical History  Diagnosis Date  . Chest pain 12/09    recent hospitalization for it. felt to be noncardiac.   [name] CAD (coronary artery disease)     a. s/p prior anterior wall myocardial infarction and multiple percutaneous coronary intervention.;  b. Ad[place] MV 1/14:  low risk, inf and apical defect likely due to atten artifact, no ischemia, EF 35% (visually normal and est 55-60%)  . Ischemic cardiomyopathy     EF 25-30% improved to 45% with biventricular pacing.   . Systolic heart failure     improved to class I to II   . Automatic implantable cardioverter/defibrillator (AICD) activation     s/p implantable cardioverter-defibrillator bivenrticular pacer. 12/05. St, Jude [employer]. remote-no.  [name] HTN (hypertension)   . Hyperlipidemia   . GERD (gastroesophageal reflux disease)   . Esophageal stricture     last dialated 2007  . Hiatal hernia   . DD (diverticular disease)     Hx of it - severe. Left colon 2004  . Other urinary problems     urinary incontenance  . UTI (lower urinary tract infection)     recurrent. (Klebsiella pneumoniae). Last Cx 02/03/09. (R-ancef, nitrofurantoin, Augmentin, Zyosyn)  . Arthritis   . PVT (paroxysmal ventricular tachycardia)   . CHF (congestive heart failure)   . Hypothyroidism   . Anxiety     sleep  . Pacemaker   . Automatic implantable cardioverter-defibrillator in situ   . Heart murmur   . History of recurrent UTIs     Past Surgical History  Procedure Laterality Date  . Angioplasty      stent  . Unspecified area hysterectomy    . Tubal ligation    . Appendectomy    . Cardiac defibrillator placement      [facility]  . Cataract extraction, bilateral    . Total knee arthroplasty      bilateral   . Tonsillectomy      Family History  Problem Relation Age of Onset  . Colon cancer Neg Hx   . Colon polyps Sister   . Heart disease Mother   . Heart disease Father   . Heart disease Brother     multiple brothers  . Heart disease Sister     multiple sisters  . Prostate cancer Brother   . Diabetes Daughter   . Diabetes      aunt  . Breast cancer Daughter   . Alcohol abuse Son    Social History:  reports that she has never smoked. She has never used smokeless tobacco. She reports that she does not drink alcohol or use illicit drugs.  Allergies:  Allergies  Allergen Reactions  . Sulfonamide Derivatives     REACTION: rash and itching    No prescriptions prior to admission    No results found for this or any previous visit (from the past 48 hour(s)). No results found.  ROS  No recent f/c/n/v/wt loss  There were no vitals taken for this visit. Physical Exam  Elderly female in nad.  A and O x 4.  Mood and affect normal.  EOMI.  Resp unlabored.  R foot with 2nd and 3rd hammertoes.  Sens to LT intact.  Skin thin.  Pulses palpable.  5/5 strength in PF and DF of the ankle.    Assessment/Plan R 2nd and 3rd hammertoes - to OR for 2nd and 3rd MT weil osteotomies and hammertoe correction.  The risks and benefits of the alternative treatment options have been discussed in detail.  The patient wishes to proceed with surgery and specifically understands risks of bleeding, infection, nerve damage, blood clots, need for additional surgery, amputation and death.   Toni Arthurs 07-18-2012, 7:29 AM

## 2012-07-17 NOTE — Progress Notes (Signed)
Orthopedic Tech Progress Note Patient Details:  [name] 08/28/1921 [id number] Darco shoe applied to Right LE Ortho Devices Type of Ortho Device: Darco shoe Ortho Device/Splint Location: Right LE Ortho Device/Splint Interventions: Application   Asia R Thompson 07/17/2012, 2:32 PM

## 2012-07-17 NOTE — Transfer of Care (Signed)
Immediate Anesthesia Transfer of Care Note  Patient: [name]  Procedure(s) Performed: Procedure(s): RIGHT SECOND AND THIRD MT WEIL OSTEOTOMIES AND SECOND AND THIRD HAMMERTOE CORRECTIONS (Right)  Patient Location: PACU  Anesthesia Type:MAC and Regional  Level of Consciousness: awake, alert  and oriented  Airway & Oxygen Therapy: Patient Spontanous Breathing and Patient connected to nasal cannula oxygen  Post-op Assessment: Report given to PACU RN, Post -op Vital signs reviewed and stable and Patient moving all extremities X 4  Post vital signs: Reviewed and stable  Complications: No apparent anesthesia complications

## 2012-07-17 NOTE — Anesthesia Postprocedure Evaluation (Signed)
  Anesthesia Post-op Note  Patient: [name]  Procedure(s) Performed: Procedure(s): RIGHT SECOND AND THIRD MT WEIL OSTEOTOMIES AND SECOND AND THIRD HAMMERTOE CORRECTIONS (Right)  Patient Location: PACU  Anesthesia Type:MAC  Level of Consciousness: awake  Airway and Oxygen Therapy: Patient Spontanous Breathing  Post-op Pain: mild  Post-op Assessment: Post-op Vital signs reviewed  Post-op Vital Signs: Reviewed  Complications: No apparent anesthesia complications

## 2012-07-18 NOTE — Op Note (Signed)
NAMENATALLY, RIBERA NO.:  [id number]  MEDICAL RECORD NO.:  [id number]  LOCATION:  [facility]                         FACILITY:  [facility]  PHYSICIAN:  Toni Arthurs, MD        DATE OF BIRTH:  06-Jan-1922  DATE OF PROCEDURE:  07/17/2012 DATE OF DISCHARGE:  07/17/2012                              OPERATIVE REPORT   PREOPERATIVE DIAGNOSIS:  Right second and third hammertoes.  POSTOPERATIVE DIAGNOSIS:  Right second and third hammertoes.  PROCEDURE: 1. Right second metatarsal Weil osteotomy. 2. Right third metatarsal Weil osteotomy. 3. Right second hammertoe correction (PIP arthrodesis). 4. Right third hammertoe correction (PIP arthrodesis). 5. Right second MTP joint dorsal capsulotomy and extensor tendon     lengthening. 6. Right third MTP joint dorsal capsulotomy and extensor tendon     lengthening. 7. Right foot AP and lateral radiographs.  SURGEON:  Toni Arthurs, MD  ANESTHESIA:  Ankle block with IV sedation.  ESTIMATED BLOOD LOSS:  Minimal.  TOURNIQUET TIME:  34 minutes with an ankle Esmarch.  COMPLICATIONS:  None apparent.  DISPOSITION:  Extubated, awake and stable to recovery.  INDICATIONS FOR PROCEDURE:  The patient is an 79 year old woman who complains of painful right second and third hammertoes for many years. She has failed treatment with shoe wear modification, activity modification, and oral anti-inflammatories.  She has also tried toe splints.  She presents now for operative treatment of these conditions. She understands the risks and benefits, the alternative treatment options and elects for operative treatment.  She specifically understands risks of bleeding, infection, nerve damage, blood clots, need for additional surgery, amputation, and death.  PROCEDURE IN DETAIL:  After preoperative consent was obtained, the correct operative site was identified, the patient was brought to the operating room and placed supine on the operating table.   Preoperative antibiotics were administered.  Surgical time-out was taken.  The patient previously had an ankle block and IV sedation was administered. The right lower extremity was prepped and draped in standard sterile fashion.  A 4 inch Esmarch tourniquet was used to exsanguinate the foot and was wrapped around the ankle as a tourniquet.  A longitudinal incision was made at the second webspace dorsally.  Sharp dissection was carried down through the skin and subcutaneous tissue.  The extensor digitorum brevis and longus tendons were lengthened to both the second and third toes at the level of the MTP joint.  The dorsal joint capsules were excised in their entirety.  The second metatarsal head was exposed. A Weil osteotomy was created removing a small wedge of bone distally. The head of the metatarsal was allowed to retract proximally several millimeters.  The osteotomy was fixed with a 2 mm partially threaded screw.  This was repeated for the third metatarsal.  Attention was then turned to the second toe.  A transverse incision was made across the PIP joint.  Sharp dissection was carried down through the skin and extensor tendon.  The head of the proximal phalanx was exposed and resected with an oscillating saw.  The base of the middle phalanx was exposed and resected with an oscillating saw.  The joint was reduced.  A 0.054 K-wire was inserted  through the tip of the toe and advanced across the DIP and PIP joints and then across the MP joint holding the toe straight in a reduced position.  This was then repeated for the third toe with resection of the head of the proximal phalanx and the base of the middle phalanx.  Again, the joints were reduced and a 0.054 K-wire was inserted from the tip of the toe across the IP joint and the MP joint.  AP and lateral radiographs were obtained showing appropriate reduction of the 2 toes and appropriate position and length of all hardware.  Both pins  were then bent, trimmed, and caps were applied.  Wounds were irrigated copiously.  The extensor tendons were repaired at their new resting length with 3-0 Monocryl simple sutures. The skin incisions were closed with running sutures of 3-0 nylon. Sterile dressings were applied followed by a well-padded compression wrap.  The tourniquet was released at approximately 34 minutes.  The patient was then awakened from anesthesia and transported to recovery room in stable condition.  FOLLOWUP PLAN:  The patient will be weightbearing as tolerated on the right foot in a Darco shoe.  She will return to follow up with me in 2 weeks in clinic for suture removal and dressing change.  Radiographs AP and lateral intraoperative simulated weightbearing views were obtained.  These radiographs showed interval correction of right second and third hammertoes with PIP arthrodesis in second and third metatarsal Weil osteotomies.  All hardware was noted to be appropriately positioned in the appropriate length.     Toni Arthurs, MD     JH/MEDQ  D:  07/17/2012  T:  07/18/2012  Job:  [id number]

## 2012-07-23 ENCOUNTER — Encounter (HOSPITAL_COMMUNITY): Payer: Self-pay | Admitting: Orthopedic Surgery

## 2012-08-01 DIAGNOSIS — M2040 Other hammer toe(s) (acquired), unspecified foot: Secondary | ICD-10-CM | POA: Diagnosis not present

## 2012-08-08 ENCOUNTER — Encounter: Payer: Self-pay | Admitting: Family Medicine

## 2012-08-18 DIAGNOSIS — M999 Biomechanical lesion, unspecified: Secondary | ICD-10-CM | POA: Diagnosis not present

## 2012-08-18 DIAGNOSIS — M5137 Other intervertebral disc degeneration, lumbosacral region: Secondary | ICD-10-CM | POA: Diagnosis not present

## 2012-08-18 DIAGNOSIS — M9981 Other biomechanical lesions of cervical region: Secondary | ICD-10-CM | POA: Diagnosis not present

## 2012-08-18 DIAGNOSIS — M5030 Other cervical disc degeneration, unspecified cervical region: Secondary | ICD-10-CM | POA: Diagnosis not present

## 2012-08-20 DIAGNOSIS — Z48816 Encounter for surgical aftercare following surgery on the genitourinary system: Secondary | ICD-10-CM | POA: Diagnosis not present

## 2012-08-22 ENCOUNTER — Ambulatory Visit (INDEPENDENT_AMBULATORY_CARE_PROVIDER_SITE_OTHER): Payer: Medicare Other | Admitting: Neurology

## 2012-08-22 ENCOUNTER — Encounter: Payer: Self-pay | Admitting: Neurology

## 2012-08-22 VITALS — BP 108/60 | HR 70 | Temp 97.5°F | Resp 14 | Wt 128.2 lb

## 2012-08-22 DIAGNOSIS — G243 Spasmodic torticollis: Secondary | ICD-10-CM | POA: Diagnosis not present

## 2012-08-22 NOTE — Progress Notes (Signed)
Here for botox.  See procedural note. 

## 2012-08-22 NOTE — Procedures (Signed)
[facility]   Procedure Note Botox  Attending: Dr. Lurena Joiner Marck Mcclenny  Preoperative Diagnosis(es): Cervical Dystonia  Result History  Onset of effect: 10 d Duration of Benefit: still working, pleased with medication.  L ear still pulls to L shoulder.  Adverse Effects: dysphagia x few days per daughter (pt denies but daughter pulls me to side to let me know that it was more significant than pt let on)  Consent obtained from: The patient Benefits discussed included, but were not limited to decreased muscle tightness, increased joint range of motion, and decreased pain.  Risk discussed included, but were not limited pain and discomfort, bleeding, bruising, excessive weakness, venous thrombosis, muscle atrophy and dysphagia.  A copy of the patient medication guide was given to the patient which explains the blackbox warning.  Patients identity and treatment sites confirmed yes.  Examination:  Prior to the procedure, the pt was reexamined.  Today, her head was most definitely turned to the R, but the head remained held in flexion.  I felt confident that the L SCM was a major contributor but will do the R SCM as well because of the head flexion.  The R splenius capitus may need to be added in the future.  Details of Procedure: Skin was cleaned with alcohol.  A 30 gauge, 1/2 inch needle was introduced to the target muscles. Prior to injection, the needle plunger was aspirated to make sure the needle was not within a blood vessel.  There was no blood retrieved on aspiration.    Following is a summary of the muscles injected  And the amount of Botulinum toxin used:   Dilution 0.9% preservative free saline mixed with 100 u Botox type A to make 10 U per 0.1cc  Injections  Location Left  Right Units Number of sites        Sternocleidomastoid 30+30 40 100   Splenius Capitus, posterior approach      Splenius Capitus, lateral approach      Levator Scapulae      Trapezius            TOTAL UNITS:    100    Agent: Botulinum Type A ( Onobotulinum Toxin type A ).  1 vials of Botox were used, each containing 100 units and freshly diluted with 1 mL of sterile, non-preserved saline   Total injected (Units): 100  Total wasted (Units): none wasted NOTE:  Decision was made today to d/c levator scapulae due to dysphagia.  Pt tolerated procedure well without complications.   Reinjection is anticipated in 3 months.  Reassessment will be in 4 weeks.

## 2012-08-25 ENCOUNTER — Other Ambulatory Visit: Payer: Self-pay | Admitting: Family Medicine

## 2012-08-25 DIAGNOSIS — M999 Biomechanical lesion, unspecified: Secondary | ICD-10-CM | POA: Diagnosis not present

## 2012-08-25 DIAGNOSIS — M5030 Other cervical disc degeneration, unspecified cervical region: Secondary | ICD-10-CM | POA: Diagnosis not present

## 2012-08-25 DIAGNOSIS — M9981 Other biomechanical lesions of cervical region: Secondary | ICD-10-CM | POA: Diagnosis not present

## 2012-08-25 DIAGNOSIS — M5137 Other intervertebral disc degeneration, lumbosacral region: Secondary | ICD-10-CM | POA: Diagnosis not present

## 2012-08-27 NOTE — Telephone Encounter (Signed)
Med filled.  

## 2012-08-28 DIAGNOSIS — M5137 Other intervertebral disc degeneration, lumbosacral region: Secondary | ICD-10-CM | POA: Diagnosis not present

## 2012-08-28 DIAGNOSIS — M5030 Other cervical disc degeneration, unspecified cervical region: Secondary | ICD-10-CM | POA: Diagnosis not present

## 2012-08-28 DIAGNOSIS — M999 Biomechanical lesion, unspecified: Secondary | ICD-10-CM | POA: Diagnosis not present

## 2012-08-28 DIAGNOSIS — M9981 Other biomechanical lesions of cervical region: Secondary | ICD-10-CM | POA: Diagnosis not present

## 2012-08-29 ENCOUNTER — Other Ambulatory Visit: Payer: Self-pay | Admitting: Family Medicine

## 2012-09-01 ENCOUNTER — Telehealth: Payer: Self-pay | Admitting: General Practice

## 2012-09-01 ENCOUNTER — Telehealth: Payer: Self-pay

## 2012-09-01 DIAGNOSIS — M5030 Other cervical disc degeneration, unspecified cervical region: Secondary | ICD-10-CM | POA: Diagnosis not present

## 2012-09-01 DIAGNOSIS — Z48816 Encounter for surgical aftercare following surgery on the genitourinary system: Secondary | ICD-10-CM | POA: Diagnosis not present

## 2012-09-01 DIAGNOSIS — M5137 Other intervertebral disc degeneration, lumbosacral region: Secondary | ICD-10-CM | POA: Diagnosis not present

## 2012-09-01 DIAGNOSIS — M9981 Other biomechanical lesions of cervical region: Secondary | ICD-10-CM | POA: Diagnosis not present

## 2012-09-01 DIAGNOSIS — M999 Biomechanical lesion, unspecified: Secondary | ICD-10-CM | POA: Diagnosis not present

## 2012-09-01 MED ORDER — NITROGLYCERIN 0.4 MG SL SUBL: 0.4000 mg | SUBLINGUAL_TABLET | SUBLINGUAL | Status: DC | PRN

## 2012-09-01 NOTE — Telephone Encounter (Signed)
Does she want to come in to discuss?  If so, fine with me.  If not, make sure that food is soft, especially meats (should be ground).  Drink ensure tid to make sure she keeps protein stores up.  Make sure she is not choking

## 2012-09-01 NOTE — Telephone Encounter (Signed)
Ok to refill 

## 2012-09-01 NOTE — Telephone Encounter (Signed)
Message on Triage Line: pt called stating that she needs a refill on her Nitroglycerin. Called pt back and she states she has not had to use this medication in almost two years so her old Rx was discontinued. Please advise if ok to fill and the quantity. Pt prefers [facility] on [address].

## 2012-09-01 NOTE — Telephone Encounter (Signed)
Med filled and pt notified.  

## 2012-09-01 NOTE — Telephone Encounter (Signed)
Spoke with pt who is having some swallowing difficulty after her last botox.  She is having a lot of mucus as well.  Advised her to eat soft foods, which she said she was.

## 2012-09-01 NOTE — Telephone Encounter (Signed)
Med filled.  

## 2012-09-02 ENCOUNTER — Ambulatory Visit: Payer: Medicare Other | Admitting: Neurology

## 2012-09-02 NOTE — Telephone Encounter (Signed)
Pt notified, she states she is not choking.

## 2012-09-03 ENCOUNTER — Ambulatory Visit: Payer: Medicare Other | Admitting: Family Medicine

## 2012-09-03 DIAGNOSIS — M5030 Other cervical disc degeneration, unspecified cervical region: Secondary | ICD-10-CM | POA: Diagnosis not present

## 2012-09-03 DIAGNOSIS — M5137 Other intervertebral disc degeneration, lumbosacral region: Secondary | ICD-10-CM | POA: Diagnosis not present

## 2012-09-03 DIAGNOSIS — M9981 Other biomechanical lesions of cervical region: Secondary | ICD-10-CM | POA: Diagnosis not present

## 2012-09-03 DIAGNOSIS — M999 Biomechanical lesion, unspecified: Secondary | ICD-10-CM | POA: Diagnosis not present

## 2012-09-09 DIAGNOSIS — M5137 Other intervertebral disc degeneration, lumbosacral region: Secondary | ICD-10-CM | POA: Diagnosis not present

## 2012-09-09 DIAGNOSIS — M9981 Other biomechanical lesions of cervical region: Secondary | ICD-10-CM | POA: Diagnosis not present

## 2012-09-09 DIAGNOSIS — M5030 Other cervical disc degeneration, unspecified cervical region: Secondary | ICD-10-CM | POA: Diagnosis not present

## 2012-09-09 DIAGNOSIS — M999 Biomechanical lesion, unspecified: Secondary | ICD-10-CM | POA: Diagnosis not present

## 2012-09-17 ENCOUNTER — Other Ambulatory Visit: Payer: Self-pay | Admitting: Family Medicine

## 2012-09-19 NOTE — Telephone Encounter (Signed)
Med filled.  

## 2012-09-30 ENCOUNTER — Other Ambulatory Visit: Payer: Self-pay | Admitting: Dermatology

## 2012-09-30 DIAGNOSIS — L821 Other seborrheic keratosis: Secondary | ICD-10-CM | POA: Diagnosis not present

## 2012-09-30 DIAGNOSIS — L723 Sebaceous cyst: Secondary | ICD-10-CM | POA: Diagnosis not present

## 2012-10-06 ENCOUNTER — Ambulatory Visit: Payer: Medicare Other | Admitting: Neurology

## 2012-10-06 DIAGNOSIS — Z9889 Other specified postprocedural states: Secondary | ICD-10-CM | POA: Diagnosis not present

## 2012-10-06 DIAGNOSIS — M2040 Other hammer toe(s) (acquired), unspecified foot: Secondary | ICD-10-CM | POA: Diagnosis not present

## 2012-10-10 ENCOUNTER — Ambulatory Visit (INDEPENDENT_AMBULATORY_CARE_PROVIDER_SITE_OTHER): Payer: Medicare Other | Admitting: Neurology

## 2012-10-10 ENCOUNTER — Ambulatory Visit: Payer: Medicare Other | Admitting: Neurology

## 2012-10-10 VITALS — BP 120/70 | HR 86 | Temp 98.0°F | Resp 16 | Wt 129.1 lb

## 2012-10-10 DIAGNOSIS — G243 Spasmodic torticollis: Secondary | ICD-10-CM

## 2012-10-10 DIAGNOSIS — R1310 Dysphagia, unspecified: Secondary | ICD-10-CM | POA: Diagnosis not present

## 2012-10-10 NOTE — Patient Instructions (Signed)
Your botox is nov 14 at 2 pm

## 2012-10-10 NOTE — Progress Notes (Signed)
Subjective:   [name] was seen in consultation in the movement disorder clinic for f/u on cervicaldystonia.    The patient is a 79 y.o. right handed female with a history of tremor.  The tremor has been present for at least 10 years.  The tremor is only present in the head.  The patient reports that she has had tremor for years but it did not bother her until recently.  She saw herself in a video recently, realized the tremor was worse than she thought and now she would like some treatment.  There is a hx of tremor in her mother and her mothers sister, but it was "slight."  The patient has some neck pain but was told it was due to osteoporosis.  She is able to turn head to drive.    10/10/2012  The patient had her first series of Botox injections on February 21.  She said that she did well with her last series of botox.  She denies dysphagia at all but stated that she had mucous come up from her stomach and it was causing problems.  She doe not think that this was from the botox.  She had previously denied dysphagia but her granddaughter had told me that she had dysphagia, but was afraid to tell me because she was so pleased with efficacy of botox.    Allergies  Allergen Reactions  . Sulfonamide Derivatives     REACTION: rash and itching    Current Outpatient Prescriptions on File Prior to Visit  Medication Sig Dispense Refill  . ALPRAZolam (XANAX) 0.25 MG tablet Take 1 tablet (0.25 mg total) by mouth 2 (two) times daily as needed for sleep.  20 tablet  0  . aspirin (ASPIR-81) 81 MG EC tablet Take 81 mg by mouth daily.        . fluticasone (FLONASE) 50 MCG/ACT nasal spray Place 2 sprays into the nose daily.  16 g  6  . furosemide (LASIX) 20 MG tablet Take 20 mg by mouth every other day.      . isosorbide mononitrate (IMDUR) 30 MG 24 hr tablet Take 1.5 tablets (45 mg total) by mouth every morning.  45 tablet  11  . levothyroxine (SYNTHROID, LEVOTHROID) 125 MCG tablet Take 1 tablet  (125 mcg total) by mouth daily.  30 tablet  6  . metoprolol succinate (TOPROL-XL) 50 MG 24 hr tablet Take 50 mg by mouth 2 (two) times daily.      . nitroGLYCERIN (NITROSTAT) 0.4 MG SL tablet Place 0.4 mg under the tongue every 5 (five) minutes as needed. At the onset of chest pain, Up to 3 doses      . pravastatin (PRAVACHOL) 20 MG tablet Take 1 tablet (20 mg total) by mouth at bedtime.  30 tablet  6  . Psyllium (METAFIBER) 48.57 % POWD Take 15 mLs by mouth at bedtime.       . ramipril (ALTACE) 5 MG capsule Take 1 capsule (5 mg total) by mouth every morning.  30 capsule  5   No current facility-administered medications on file prior to visit.    Past Medical History  Diagnosis Date  . Chest pain 12/09    recent hospitalization for it. felt to be noncardiac.   [name] CAD (coronary artery disease)     a. s/p prior anterior wall myocardial infarction and multiple percutaneous coronary intervention.;  b. Aden MV 1/14:  low risk, inf and apical defect likely due to atten artifact,  no ischemia, EF 35% (visually normal and est 55-60%)  . Ischemic cardiomyopathy     EF 25-30% improved to 45% with biventricular pacing.   . Systolic heart failure     improved to class I to II   . Automatic implantable cardioverter/defibrillator (AICD) activation     s/p implantable cardioverter-defibrillator bivenrticular pacer. 12/05. St, [employer]. remote-no.  [name] HTN (hypertension)   . Hyperlipidemia   . GERD (gastroesophageal reflux disease)   . Esophageal stricture     last dialated 2007  . Hiatal hernia   . DD (diverticular disease)     Hx of it - severe. Left colon 2004  . Other urinary problems     urinary incontenance  . UTI (lower urinary tract infection)     recurrent. (Klebsiella pneumoniae). Last Cx 02/03/09. (R-ancef, nitrofurantoin, Augmentin, Zyosyn)  . Arthritis   . PVT (paroxysmal ventricular tachycardia)     Past Surgical History  Procedure Laterality Date  . Angioplasty      stent  .  Unspecified area hysterectomy    . Tubal ligation    . Appendectomy    . Cardiac defibrillator placement      [facility]  . Cataract extraction, bilateral    . Total knee arthroplasty      bilateral    History   Social History  . Marital Status: Widowed    Spouse Name: N/A    Number of Children: N/A  . Years of Education: N/A   Occupational History  . Not on file.   Social History Main Topics  . Smoking status: Never Smoker   . Smokeless tobacco: Never Used  . Alcohol Use: No  . Drug Use: No  . Sexually Active: Not on file   Other Topics Concern  . Not on file   Social History Narrative   Widowed, husband died of brain tumor. Son lives in [place], [place]- was in a MVA for DWI. He has had a long hx of alcohol problems, she has helped him out financially in the past but is ready to be much stricter. Pt. Has a daughter who live in [place] with Breast Ca. She also lost a son to a brain tumor.     Family Status  Relation Status Death Age  . Mother Deceased     MI  . Father Deceased     MI  . Sister Deceased     2, CAD  . Brother Deceased     3, trauma, CAD, CA  . Sister Alive     2,   . Brother Alive     2  . Child Deceased     1, CA (lung)  . Child Alive     3, Rh Factor, Breast CA    Review of Systems A complete 10 system ROS was obtained and was negative apart from what is mentioned.   Objective:   VITALS:   Filed Vitals:   02/19/12 1101  BP: 118/64  Pulse: 84  Temp: 97.8 F (36.6 C)  Resp: 12  Height: 4\' 11"  (1.499 m)  Weight: 132 lb (59.875 kg)   Gen:  Appears stated age and in NAD. HEENT:  Normocephalic, atraumatic. The mucous membranes are moist. The superficial temporal arteries are without ropiness or tenderness. Cardiovascular: Regular rate and rhythm. Lungs: Clear to auscultation bilaterally. Neck: There are no carotid bruits noted bilaterally.  NEUROLOGICAL:  Orientation:  The patient is alert and oriented x 3.  Recent and remote memory  are intact.  Attention span and concentration are normal.  Able to name objects and repeat without trouble.  Fund of knowledge is appropriate Cranial nerves: There is good facial symmetry. The pupils are equal round and reactive to light bilaterally.  Fundoscopic exam is attempted but the disc margins are not well visualized bilaterally.  . Extraocular muscles are intact and visual fields are full to confrontational testing. Speech is fluent and clear. Soft palate rises symmetrically and there is no tongue deviation. Hearing is intact to conversational tone. Tone: Tone is good throughout.  No rigidity is noted. Sensation: Sensation is intact to light touch throughout (facial, trunk, extremities). Vibration is intact at the bilateral big ankle. There is no extinction with double simultaneous stimulation. There is no sensory dermatomal level identified. Coordination:  The patient has no dysdiadichokinesia or dysmetria. Motor: Strength is 5/5 in the bilateral upper and lower extremities.  Shoulder shrug is equal bilaterally.  There is no pronator drift.  There are no fasciculations noted. DTR's: Deep tendon reflexes are 1/4 at the bilateral biceps, triceps, brachioradialis, patella and achilles.  Plantar responses are downgoing bilaterally. Gait and Station: The patient has a stooped posture and because of that is somewhat off balance. MOVEMENT EXAM: Tremor:  There is no hand tremor present.  There is very intermittent head tremor again today.  SB is good  There is hypertrophy of the left levator scapulae muscle.     Assessment:   Cervical dystonia.  She is improved after botox but I worry about dysphagia. Plan:   After a long discussion, including the black blox warning, she decided that she would like to continue with Botox injections.  I am hoping that her granddaughter comes to the next series of injections.  The last time, I even lowered the dose because of dysphagia.  It is difficult to tell  whether or not she had dysphagia this time, as she denies it, but states that she had a large amount of mucus production (insists not saliva).

## 2012-10-11 ENCOUNTER — Other Ambulatory Visit: Payer: Self-pay | Admitting: Family Medicine

## 2012-10-13 ENCOUNTER — Ambulatory Visit (INDEPENDENT_AMBULATORY_CARE_PROVIDER_SITE_OTHER): Payer: Medicare Other | Admitting: *Deleted

## 2012-10-13 ENCOUNTER — Ambulatory Visit (INDEPENDENT_AMBULATORY_CARE_PROVIDER_SITE_OTHER): Payer: Medicare Other | Admitting: Family Medicine

## 2012-10-13 ENCOUNTER — Other Ambulatory Visit: Payer: Self-pay | Admitting: *Deleted

## 2012-10-13 ENCOUNTER — Encounter: Payer: Self-pay | Admitting: Family Medicine

## 2012-10-13 VITALS — BP 120/72 | HR 90 | Temp 98.2°F | Resp 16 | Wt 127.2 lb

## 2012-10-13 DIAGNOSIS — Z23 Encounter for immunization: Secondary | ICD-10-CM

## 2012-10-13 DIAGNOSIS — I472 Ventricular tachycardia: Secondary | ICD-10-CM | POA: Diagnosis not present

## 2012-10-13 DIAGNOSIS — M549 Dorsalgia, unspecified: Secondary | ICD-10-CM | POA: Diagnosis not present

## 2012-10-13 DIAGNOSIS — Z95810 Presence of automatic (implantable) cardiac defibrillator: Secondary | ICD-10-CM

## 2012-10-13 DIAGNOSIS — I2589 Other forms of chronic ischemic heart disease: Secondary | ICD-10-CM | POA: Diagnosis not present

## 2012-10-13 DIAGNOSIS — E039 Hypothyroidism, unspecified: Secondary | ICD-10-CM

## 2012-10-13 DIAGNOSIS — I10 Essential (primary) hypertension: Secondary | ICD-10-CM | POA: Diagnosis not present

## 2012-10-13 DIAGNOSIS — E785 Hyperlipidemia, unspecified: Secondary | ICD-10-CM | POA: Diagnosis not present

## 2012-10-13 DIAGNOSIS — T82198A Other mechanical complication of other cardiac electronic device, initial encounter: Secondary | ICD-10-CM

## 2012-10-13 MED ORDER — FUROSEMIDE 20 MG PO TABS: ORAL_TABLET | ORAL | Status: DC

## 2012-10-13 MED ORDER — METHYLPREDNISOLONE ACETATE 80 MG/ML IJ SUSP
80.0000 mg | Freq: Once | INTRAMUSCULAR | Status: AC
  Administered 2012-10-13: 80 mg via INTRAMUSCULAR

## 2012-10-13 NOTE — Telephone Encounter (Signed)
Pharmacy called and stated that the Rx accidentally got deleted. Rx resent.[name] SW

## 2012-10-13 NOTE — Telephone Encounter (Signed)
Med filled.  

## 2012-10-13 NOTE — Patient Instructions (Addendum)
Schedule your complete physical in 6 months We'll notify you of your lab results and make any changes if needed Keep up the good work!  You look great! Safe travels!

## 2012-10-13 NOTE — Progress Notes (Signed)
  Subjective:    Patient ID: [name], female    DOB: 09-Oct-1921, 79 y.o.   MRN: [id number]  HPI Back pain- pt typically gets depomedrol injxn prior to flying due to back pain.  This makes flight much more bearable for her.  Requesting today.  HTN- chronic problem, on Imdur, Toprol, ramipril.  Denies CP, SOB, HAs, visual changes, edema  Hyperlipidemia- chronic problem, on pravastatin.  Denies abd pain, N/V, myalgias.  Hypothyroid- chronic problem, on Synthroid daily.  Denies fatigue, skin changes, constipation/diarrhea.   Review of Systems For ROS see HPI     Objective:   Physical Exam  Vitals reviewed. Constitutional: She is oriented to person, place, and time. She appears well-developed and well-nourished. No distress.  HENT:  Head: Normocephalic and atraumatic.  Eyes: Conjunctivae and EOM are normal. Pupils are equal, round, and reactive to light.  Neck: Normal range of motion. Neck supple. No thyromegaly present.  Cardiovascular: Normal rate, regular rhythm, normal heart sounds and intact distal pulses.   Pulmonary/Chest: Effort normal and breath sounds normal. No respiratory distress.  Abdominal: Soft. She exhibits no distension. There is no tenderness.  Musculoskeletal: She exhibits no edema.  Lymphadenopathy:    She has no cervical adenopathy.  Neurological: She is alert and oriented to person, place, and time.  Skin: Skin is warm and dry.  Psychiatric: She has a normal mood and affect. Her behavior is normal.          Assessment & Plan:

## 2012-10-14 LAB — REMOTE ICD DEVICE
AL AMPLITUDE: 3.6 mv
ATRIAL PACING ICD: 1 pct
BAMS-0003: 70 {beats}/min
BRDY-0002RV: 50 {beats}/min
DEVICE MODEL ICD: 815822
HV IMPEDENCE: 38 Ohm
RV LEAD AMPLITUDE: 9.7 mv
RV LEAD IMPEDENCE ICD: 600 Ohm
TZAT-0001SLOWVT: 1
TZAT-0004SLOWVT: 8
TZAT-0012FASTVT: 200 ms
TZAT-0012SLOWVT: 200 ms
TZAT-0013FASTVT: 1
TZAT-0018SLOWVT: NEGATIVE
TZAT-0019FASTVT: 7.5 V
TZAT-0020FASTVT: 1 ms
TZON-0004FASTVT: 30
TZON-0004SLOWVT: 45
TZON-0005FASTVT: 6
TZON-0005SLOWVT: 6
TZON-0010SLOWVT: 40 ms
TZST-0001FASTVT: 3
TZST-0001FASTVT: 5
TZST-0001SLOWVT: 2
TZST-0001SLOWVT: 3
TZST-0001SLOWVT: 4
TZST-0003FASTVT: 36 J
TZST-0003FASTVT: 40 J
TZST-0003SLOWVT: 15 J
TZST-0003SLOWVT: 40 J
VENTRICULAR PACING ICD: 98 pct

## 2012-10-14 LAB — BASIC METABOLIC PANEL
BUN: 15 mg/dL (ref 6–23)
Chloride: 103 mEq/L (ref 96–112)
Glucose, Bld: 92 mg/dL (ref 70–99)
Potassium: 4 mEq/L (ref 3.5–5.1)
Sodium: 139 mEq/L (ref 135–145)

## 2012-10-14 LAB — CBC WITH DIFFERENTIAL/PLATELET
Basophils Relative: 0.3 % (ref 0.0–3.0)
Eosinophils Relative: 5 % (ref 0.0–5.0)
Lymphocytes Relative: 25.9 % (ref 12.0–46.0)
Monocytes Relative: 9.1 % (ref 3.0–12.0)
Neutrophils Relative %: 59.7 % (ref 43.0–77.0)
Platelets: 213 10*3/uL (ref 150.0–400.0)
RBC: 4.4 Mil/uL (ref 3.87–5.11)
WBC: 7 10*3/uL (ref 4.5–10.5)

## 2012-10-14 LAB — HEPATIC FUNCTION PANEL
AST: 24 U/L (ref 0–37)
Albumin: 4.3 g/dL (ref 3.5–5.2)
Bilirubin, Direct: 0.1 mg/dL (ref 0.0–0.3)
Total Protein: 7 g/dL (ref 6.0–8.3)

## 2012-10-14 LAB — TSH: TSH: 0.16 u[IU]/mL — ABNORMAL LOW (ref 0.35–5.50)

## 2012-10-14 LAB — LIPID PANEL
Cholesterol: 136 mg/dL (ref 0–200)
HDL: 39.4 mg/dL (ref >39.00)
Total CHOL/HDL Ratio: 3
Triglycerides: 129 mg/dL (ref 0.0–149.0)

## 2012-10-14 NOTE — Assessment & Plan Note (Signed)
Chronic problem.  Pt is able to tolerate flight to [place] after receiving depo-medrol injxn.

## 2012-10-14 NOTE — Assessment & Plan Note (Signed)
Chronic problem.  Currently asymptomatic.  Check labs.  Adjust meds prn  

## 2012-10-14 NOTE — Assessment & Plan Note (Signed)
Chronic problem.  Tolerating statin w/out difficulty.  Check labs.  Adjust meds prn  

## 2012-10-14 NOTE — Assessment & Plan Note (Signed)
Chronic problem.  Well controlled.  Asymptomatic.  Check labs.  No anticipated changes. 

## 2012-10-15 ENCOUNTER — Other Ambulatory Visit: Payer: Self-pay | Admitting: Family Medicine

## 2012-10-16 ENCOUNTER — Other Ambulatory Visit: Payer: Self-pay | Admitting: General Practice

## 2012-10-16 MED ORDER — PRAVASTATIN SODIUM 20 MG PO TABS: 20.0000 mg | ORAL_TABLET | Freq: Every day | ORAL | Status: DC

## 2012-10-16 MED ORDER — LEVOTHYROXINE SODIUM 112 MCG PO TABS: 112.0000 ug | ORAL_TABLET | Freq: Every day | ORAL | Status: DC

## 2012-10-16 MED ORDER — METOPROLOL SUCCINATE ER 50 MG PO TB24: 50.0000 mg | ORAL_TABLET | Freq: Two times a day (BID) | ORAL | Status: DC

## 2012-10-16 NOTE — Telephone Encounter (Signed)
Medications filled earlier.

## 2012-10-27 DIAGNOSIS — I428 Other cardiomyopathies: Secondary | ICD-10-CM | POA: Diagnosis not present

## 2012-10-27 DIAGNOSIS — I2510 Atherosclerotic heart disease of native coronary artery without angina pectoris: Secondary | ICD-10-CM | POA: Diagnosis not present

## 2012-10-27 DIAGNOSIS — I10 Essential (primary) hypertension: Secondary | ICD-10-CM | POA: Diagnosis not present

## 2012-10-29 ENCOUNTER — Encounter: Payer: Self-pay | Admitting: Internal Medicine

## 2012-10-29 DIAGNOSIS — I2510 Atherosclerotic heart disease of native coronary artery without angina pectoris: Secondary | ICD-10-CM | POA: Diagnosis not present

## 2012-10-29 DIAGNOSIS — I428 Other cardiomyopathies: Secondary | ICD-10-CM | POA: Diagnosis not present

## 2012-10-29 DIAGNOSIS — I10 Essential (primary) hypertension: Secondary | ICD-10-CM | POA: Diagnosis not present

## 2012-10-30 DIAGNOSIS — I10 Essential (primary) hypertension: Secondary | ICD-10-CM | POA: Diagnosis not present

## 2012-10-30 DIAGNOSIS — I2510 Atherosclerotic heart disease of native coronary artery without angina pectoris: Secondary | ICD-10-CM | POA: Diagnosis not present

## 2012-10-30 DIAGNOSIS — I428 Other cardiomyopathies: Secondary | ICD-10-CM | POA: Diagnosis not present

## 2012-11-14 ENCOUNTER — Encounter: Payer: Self-pay | Admitting: Internal Medicine

## 2012-11-18 ENCOUNTER — Telehealth: Payer: Self-pay

## 2012-11-18 NOTE — Telephone Encounter (Signed)
Left message for call back Non identifiable  Flu vaccine ? [facility]--last 10/2002 Bone Density--04/2012 [facility]--08/2010--neg

## 2012-11-20 ENCOUNTER — Other Ambulatory Visit: Payer: Self-pay | Admitting: General Practice

## 2012-11-20 ENCOUNTER — Encounter: Payer: Self-pay | Admitting: Family Medicine

## 2012-11-20 ENCOUNTER — Ambulatory Visit (INDEPENDENT_AMBULATORY_CARE_PROVIDER_SITE_OTHER): Payer: Medicare Other | Admitting: Family Medicine

## 2012-11-20 VITALS — BP 120/70 | HR 76 | Temp 98.1°F | Resp 17 | Ht 60.0 in | Wt 127.0 lb

## 2012-11-20 DIAGNOSIS — E039 Hypothyroidism, unspecified: Secondary | ICD-10-CM

## 2012-11-20 DIAGNOSIS — Z0000 Encounter for general adult medical examination without abnormal findings: Secondary | ICD-10-CM | POA: Diagnosis not present

## 2012-11-20 DIAGNOSIS — I2589 Other forms of chronic ischemic heart disease: Secondary | ICD-10-CM

## 2012-11-20 DIAGNOSIS — E785 Hyperlipidemia, unspecified: Secondary | ICD-10-CM

## 2012-11-20 DIAGNOSIS — I10 Essential (primary) hypertension: Secondary | ICD-10-CM | POA: Diagnosis not present

## 2012-11-20 LAB — TSH: TSH: 0.19 u[IU]/mL — ABNORMAL LOW (ref 0.35–5.50)

## 2012-11-20 MED ORDER — LEVOTHYROXINE SODIUM 100 MCG PO TABS: 100.0000 ug | ORAL_TABLET | Freq: Every day | ORAL | Status: DC

## 2012-11-20 NOTE — Assessment & Plan Note (Signed)
Chronic problem.  Tolerating statin w/out difficulty.  Reviewed recent labs.  No med changes.

## 2012-11-20 NOTE — Assessment & Plan Note (Addendum)
Pt's PE WNL.  UTD on health maintenance- has mammo upcoming.  Reviewed recent labs.  Anticipatory guidance provided.

## 2012-11-20 NOTE — Telephone Encounter (Signed)
Unable to reach pre visit.  

## 2012-11-20 NOTE — Assessment & Plan Note (Signed)
Chronic problem.  Synthroid was adjusted at last visit.  Repeat labs.

## 2012-11-20 NOTE — Assessment & Plan Note (Signed)
Chronic problem.  Well controlled.  Asymptomatic.  No changes. 

## 2012-11-20 NOTE — Progress Notes (Signed)
  Subjective:    Patient ID: [name], female    DOB: September 13, 1921, 79 y.o.   MRN: [id number]  HPI Pre visit review using our clinic review tool, if applicable. No additional management support is needed unless otherwise documented below in the visit note.  Here today for CPE.  Risk Factors: HTN- chronic problem, on Toprol and Altace.  Denies CP, had some SOB while in [place] due to increased exertion, no HAs, visual changes, edema. Hyperlipidemia- chronic problem, on Pravastatin.  Denies abd pain, N/V, myalgias Osteoporosis- UTD on DEXA, currently on Ca and Vit D CHF- chronic problem, following regularly w/ cards.  Has appt tomorrow Hypothyroid- chronic problem, at last visit Synthroid was decreased to Physical Activity: exercising regularly Fall Risk: moderate risk, using rolling walker due to severe back pain Depression: no current sxs Hearing: mildly decreased to conversational tones and whispered voice ADL's: independent Cognitive: normal linear thought process, memory and attention intact Home Safety: safe at home Height, Weight, BMI, Visual Acuity: see vitals, vision corrected to 20/20 w/ glasses Counseling: UTD on DEXA, colonoscopy.  No need for paps.  Due for mammo. Labs Ordered: See A&P Care Plan: See A&P    Review of Systems Patient reports no vision/ hearing changes, adenopathy,fever, weight change,  persistant/recurrent hoarseness , swallowing issues, chest pain, palpitations, edema, persistant/recurrent cough, hemoptysis, dyspnea (rest/exertional/paroxysmal nocturnal), gastrointestinal bleeding (melena, rectal bleeding), abdominal pain, significant heartburn, bowel changes, GU symptoms (dysuria, hematuria, incontinence), Gyn symptoms (abnormal  bleeding, pain),  syncope, focal weakness, memory loss, numbness & tingling, skin/hair/nail changes, abnormal bruising or bleeding, anxiety, or depression.     Objective:   Physical Exam General Appearance:    Alert,  cooperative, no distress, appears stated age  Head:    Normocephalic, without obvious abnormality, atraumatic  Eyes:    PERRL, conjunctiva/corneas clear, EOM's intact, fundi    benign, both eyes  Ears:    Normal TM's and external ear canals, both ears  Nose:   Nares normal, septum midline, mucosa normal, no drainage    or sinus tenderness  Throat:   Lips, mucosa, and tongue normal; teeth and gums normal  Neck:   Supple, symmetrical, trachea midline, no adenopathy;    Thyroid: no enlargement/tenderness/nodules  Back:     Symmetric, no curvature, ROM normal, no CVA tenderness  Lungs:     Clear to auscultation bilaterally, respirations unlabored  Chest Wall:    No tenderness or deformity   Heart:    Regular rate and rhythm, S1 and S2 normal, no murmur, rub   or gallop  Breast Exam:    Deferred to mammo  Abdomen:     Soft, non-tender, bowel sounds active all four quadrants,    no masses, no organomegaly  Genitalia:    Deferred  Rectal:    Extremities:   Extremities normal, atraumatic, no cyanosis or edema  Pulses:   2+ and symmetric all extremities  Skin:   Skin color, texture, turgor normal, no rashes or lesions  Lymph nodes:   Cervical, supraclavicular, and axillary nodes normal  Neurologic:   CNII-XII intact, normal strength, sensation and reflexes    throughout          Assessment & Plan:

## 2012-11-20 NOTE — Assessment & Plan Note (Signed)
Following regularly w/ cards- asymptomatic.  Will follow.

## 2012-11-20 NOTE — Patient Instructions (Signed)
Follow up in 6 months to recheck BP, cholesterol, and thyroid We'll notify you of your thyroid levels Keep up the good work!  You look great! Call with any questions or concerns Happy Holidays!

## 2012-11-21 ENCOUNTER — Ambulatory Visit: Payer: Medicare Other | Admitting: Neurology

## 2012-11-21 ENCOUNTER — Ambulatory Visit (INDEPENDENT_AMBULATORY_CARE_PROVIDER_SITE_OTHER): Payer: Medicare Other | Admitting: Neurology

## 2012-11-21 ENCOUNTER — Encounter: Payer: Medicare Other | Admitting: Neurology

## 2012-11-21 VITALS — BP 132/68 | HR 84 | Temp 97.8°F | Ht 60.5 in | Wt 130.4 lb

## 2012-11-21 DIAGNOSIS — Z79899 Other long term (current) drug therapy: Secondary | ICD-10-CM | POA: Diagnosis not present

## 2012-11-21 DIAGNOSIS — R4789 Other speech disturbances: Secondary | ICD-10-CM

## 2012-11-21 DIAGNOSIS — G243 Spasmodic torticollis: Secondary | ICD-10-CM

## 2012-11-21 DIAGNOSIS — R479 Unspecified speech disturbances: Secondary | ICD-10-CM

## 2012-11-21 MED ORDER — ONABOTULINUMTOXINA 100 UNITS IJ SOLR
100.0000 [IU] | Freq: Once | INTRAMUSCULAR | Status: AC
  Administered 2012-11-21: 100 [IU] via INTRAMUSCULAR

## 2012-11-21 MED ORDER — ONABOTULINUMTOXINA 100 UNITS IJ SOLR
10.0000 [IU] | Freq: Once | INTRAMUSCULAR | Status: AC
  Administered 2012-11-21: 10 [IU] via INTRAMUSCULAR

## 2012-11-21 NOTE — Progress Notes (Signed)
See botox procedure note 

## 2012-11-21 NOTE — Procedures (Signed)
[facility]   Procedure Note Botox  Attending: Dr. Lurena Joiner Gaven Eugene  Preoperative Diagnosis(es): Cervical Dystonia  Result History  Onset of effect: 10 d Duration of Benefit: still working, pleased with medication.  On exam today, much more head turn to the R Adverse Effects: dysphagia   Consent obtained from: The patient Benefits discussed included, but were not limited to decreased muscle tightness, increased joint range of motion, and decreased pain.  Risk discussed included, but were not limited pain and discomfort, bleeding, bruising, excessive weakness, venous thrombosis, muscle atrophy and dysphagia.  A copy of the patient medication guide was given to the patient which explains the blackbox warning.  Patients identity and treatment sites confirmed yes.  Examination:  Prior to the procedure, the pt was reexamined.  Today, her head was most definitely turned to the R, but the head remained held in flexion.  I felt confident that the L SCM was a major contributor but will do the R SCM as well because of the head flexion.  The R splenius capitus may need to be added in the future.  Details of Procedure: Skin was cleaned with alcohol.  A 30 gauge, 1/2 inch needle was introduced to the target muscles. Prior to injection, the needle plunger was aspirated to make sure the needle was not within a blood vessel.  There was no blood retrieved on aspiration.    Following is a summary of the muscles injected  And the amount of Botulinum toxin used:   Dilution 0.9% preservative free saline mixed with 100 u Botox type A to make 10 U per 0.1cc  Injections  Location Left  Right Units Number of sites        Sternocleidomastoid 30+30  60   Splenius Capitus, posterior approach      Splenius Capitus, lateral approach  50    Levator Scapulae      Trapezius            TOTAL UNITS:   110    Agent: Botulinum Type A ( Onobotulinum Toxin type A ).  1 vials of Botox were used, each containing 100  units and freshly diluted with 1 mL of sterile, non-preserved saline   Total injected (Units): 110  Total wasted (Units): none wasted NOTE:  Decision was made today to d/c levator scapulae due to dysphagia.  Pt tolerated procedure well without complications.   Reinjection is anticipated in 3 months.  Reassessment will be in 4 weeks.

## 2012-12-09 ENCOUNTER — Ambulatory Visit: Payer: Medicare Other

## 2012-12-17 ENCOUNTER — Encounter: Payer: Self-pay | Admitting: Family Medicine

## 2012-12-17 ENCOUNTER — Ambulatory Visit: Payer: Medicare Other | Attending: Neurology

## 2012-12-17 DIAGNOSIS — IMO0001 Reserved for inherently not codable concepts without codable children: Secondary | ICD-10-CM | POA: Insufficient documentation

## 2012-12-17 DIAGNOSIS — R471 Dysarthria and anarthria: Secondary | ICD-10-CM | POA: Diagnosis not present

## 2012-12-19 ENCOUNTER — Telehealth: Payer: Self-pay | Admitting: Family Medicine

## 2012-12-19 DIAGNOSIS — G8929 Other chronic pain: Secondary | ICD-10-CM

## 2012-12-19 NOTE — Telephone Encounter (Signed)
Patient granddaughter called to request a referral for Ms [name]  to [facility] to see dr Jillyn Hidden. Patient states that her grandmother is having a lot of back problems lately. Please advise.

## 2012-12-19 NOTE — Telephone Encounter (Signed)
Pt has chronic back pain- ok to refer

## 2012-12-19 NOTE — Telephone Encounter (Signed)
Referral placed.

## 2012-12-23 ENCOUNTER — Other Ambulatory Visit: Payer: Self-pay

## 2012-12-23 DIAGNOSIS — R079 Chest pain, unspecified: Secondary | ICD-10-CM

## 2012-12-23 MED ORDER — ISOSORBIDE MONONITRATE ER 30 MG PO TB24: 45.0000 mg | ORAL_TABLET | Freq: Every morning | ORAL | Status: DC

## 2012-12-24 ENCOUNTER — Other Ambulatory Visit: Payer: Self-pay | Admitting: General Practice

## 2012-12-24 ENCOUNTER — Other Ambulatory Visit: Payer: Self-pay | Admitting: *Deleted

## 2012-12-24 DIAGNOSIS — R079 Chest pain, unspecified: Secondary | ICD-10-CM

## 2012-12-24 MED ORDER — PRAVASTATIN SODIUM 20 MG PO TABS: 20.0000 mg | ORAL_TABLET | Freq: Every day | ORAL | Status: DC

## 2012-12-24 MED ORDER — RAMIPRIL 5 MG PO CAPS: ORAL_CAPSULE | ORAL | Status: DC

## 2012-12-24 MED ORDER — METOPROLOL SUCCINATE ER 50 MG PO TB24: ORAL_TABLET | ORAL | Status: DC

## 2012-12-24 MED ORDER — FUROSEMIDE 20 MG PO TABS: 20.0000 mg | ORAL_TABLET | Freq: Every day | ORAL | Status: DC | PRN

## 2012-12-24 MED ORDER — ISOSORBIDE MONONITRATE ER 30 MG PO TB24: 45.0000 mg | ORAL_TABLET | Freq: Every morning | ORAL | Status: DC

## 2012-12-25 ENCOUNTER — Ambulatory Visit: Payer: Medicare Other | Admitting: Speech Pathology

## 2012-12-25 DIAGNOSIS — IMO0001 Reserved for inherently not codable concepts without codable children: Secondary | ICD-10-CM | POA: Diagnosis not present

## 2012-12-25 DIAGNOSIS — R471 Dysarthria and anarthria: Secondary | ICD-10-CM | POA: Diagnosis not present

## 2012-12-26 ENCOUNTER — Ambulatory Visit: Payer: Medicare Other

## 2012-12-26 DIAGNOSIS — IMO0001 Reserved for inherently not codable concepts without codable children: Secondary | ICD-10-CM | POA: Diagnosis not present

## 2012-12-26 DIAGNOSIS — R471 Dysarthria and anarthria: Secondary | ICD-10-CM | POA: Diagnosis not present

## 2012-12-30 ENCOUNTER — Ambulatory Visit: Payer: Medicare Other

## 2012-12-30 DIAGNOSIS — IMO0001 Reserved for inherently not codable concepts without codable children: Secondary | ICD-10-CM | POA: Diagnosis not present

## 2012-12-30 DIAGNOSIS — R471 Dysarthria and anarthria: Secondary | ICD-10-CM | POA: Diagnosis not present

## 2013-01-05 ENCOUNTER — Encounter (HOSPITAL_COMMUNITY): Payer: Self-pay | Admitting: Pharmacy Technician

## 2013-01-06 ENCOUNTER — Encounter: Payer: Medicare Other | Admitting: Speech Pathology

## 2013-01-09 NOTE — Pre-Procedure Instructions (Signed)
Tracy MontanaDorothy G [name]  01/09/2013   Your procedure is scheduled on:  Thursday January 15, 2013 @ 12:15 PM.  [facility]"  Admitting at 10:15 AM.  Call this number if you have problems the morning of surgery: [phone]   Remember:   Do not eat food or drink liquids after midnight.   Take these medicines the morning of surgery with A SIP OF WATER: Alprazolam (Xanax) if needed, Isosorbide (Imdur), Metoprolol (Toprol XL), Levothyroxine (Synthroid), Omeprazole (Prilosec)   Do not wear jewelry, make-up or nail polish.  Do not wear lotions, powders, or perfumes. You may wear deodorant.  Do not shave 48 hours prior to surgery.   Do not bring valuables to the hospital.  [facility] is not responsible for any belongings or valuables.               Contacts, dentures or bridgework may not be worn into surgery.  Leave suitcase in the car. After surgery it may be brought to your room.  For patients admitted to the hospital, discharge time is determined by your treatment team.               Patients discharged the day of surgery will not be allowed to drive home.  Name and phone number of your driver: Family/Friend  Special Instructions: Shower using CHG 2 nights before surgery and the night before surgery.  If you shower the day of surgery use CHG.  Use special wash - you have one bottle of CHG for all showers.  You should use approximately 1/3 of the bottle for each shower.   Please read over the following fact sheets that you were given: Pain Booklet, Coughing and Deep Breathing and Surgical Site Infection Prevention

## 2013-01-12 ENCOUNTER — Encounter (HOSPITAL_COMMUNITY)
Admission: RE | Admit: 2013-01-12 | Discharge: 2013-01-12 | Disposition: A | Discharge status: Home / Self Care | Payer: Medicare Other | Source: Ambulatory Visit | Attending: Orthopedic Surgery | Admitting: Orthopedic Surgery

## 2013-01-12 ENCOUNTER — Encounter (HOSPITAL_COMMUNITY): Payer: Self-pay

## 2013-01-12 DIAGNOSIS — I5020 Unspecified systolic (congestive) heart failure: Secondary | ICD-10-CM | POA: Diagnosis not present

## 2013-01-12 DIAGNOSIS — I2589 Other forms of chronic ischemic heart disease: Secondary | ICD-10-CM | POA: Diagnosis not present

## 2013-01-12 DIAGNOSIS — M2040 Other hammer toe(s) (acquired), unspecified foot: Secondary | ICD-10-CM | POA: Diagnosis not present

## 2013-01-12 DIAGNOSIS — I509 Heart failure, unspecified: Secondary | ICD-10-CM | POA: Diagnosis not present

## 2013-01-12 DIAGNOSIS — Z01812 Encounter for preprocedural laboratory examination: Secondary | ICD-10-CM | POA: Diagnosis not present

## 2013-01-12 DIAGNOSIS — Z95810 Presence of automatic (implantable) cardiac defibrillator: Secondary | ICD-10-CM | POA: Diagnosis not present

## 2013-01-12 DIAGNOSIS — I2510 Atherosclerotic heart disease of native coronary artery without angina pectoris: Secondary | ICD-10-CM | POA: Diagnosis not present

## 2013-01-12 DIAGNOSIS — K219 Gastro-esophageal reflux disease without esophagitis: Secondary | ICD-10-CM | POA: Diagnosis not present

## 2013-01-12 DIAGNOSIS — I10 Essential (primary) hypertension: Secondary | ICD-10-CM | POA: Diagnosis not present

## 2013-01-12 LAB — BASIC METABOLIC PANEL
BUN: 14 mg/dL (ref 6–23)
CO2: 26 mEq/L (ref 19–32)
CREATININE: 0.59 mg/dL (ref 0.50–1.10)
Calcium: 10.4 mg/dL (ref 8.4–10.5)
Chloride: 105 mEq/L (ref 96–112)
GFR calc Af Amer: >90 mL/min (ref >90)
GFR, EST NON AFRICAN AMERICAN: 79 mL/min — AB (ref >90)
GLUCOSE: 107 mg/dL — AB (ref 70–99)
Potassium: 5 mEq/L (ref 3.7–5.3)
Sodium: 141 mEq/L (ref 137–147)

## 2013-01-12 LAB — CBC
HEMATOCRIT: 38.8 % (ref 36.0–46.0)
Hemoglobin: 13.1 g/dL (ref 12.0–15.0)
MCH: 30.5 pg (ref 26.0–34.0)
MCHC: 33.8 g/dL (ref 30.0–36.0)
MCV: 90.2 fL (ref 78.0–100.0)
Platelets: 177 10*3/uL (ref 150–400)
RBC: 4.3 MIL/uL (ref 3.87–5.11)
RDW: 13.7 % (ref 11.5–15.5)
WBC: 6.5 10*3/uL (ref 4.0–10.5)

## 2013-01-12 NOTE — Progress Notes (Signed)
Patient denied having any cardiac issues at this time. PCP is Dr. Beverely Lowabori and Cardiologist is Dr. Graciela HusbandsKlein. ICD form sent to Dr. Graciela HusbandsKlein on Friday and sheet was returned and placed on chart. Patient informed Nurse that she is scheduled to have a check up with Dr. Beverely Lowabori before surgery and will have a EKG done then.

## 2013-01-13 ENCOUNTER — Encounter: Payer: Medicare Other | Admitting: Speech Pathology

## 2013-01-13 DIAGNOSIS — M5137 Other intervertebral disc degeneration, lumbosacral region: Secondary | ICD-10-CM | POA: Diagnosis not present

## 2013-01-13 DIAGNOSIS — M542 Cervicalgia: Secondary | ICD-10-CM | POA: Diagnosis not present

## 2013-01-13 DIAGNOSIS — M545 Low back pain, unspecified: Secondary | ICD-10-CM | POA: Diagnosis not present

## 2013-01-13 DIAGNOSIS — M5030 Other cervical disc degeneration, unspecified cervical region: Secondary | ICD-10-CM | POA: Diagnosis not present

## 2013-01-13 NOTE — Progress Notes (Signed)
Anesthesia Chart Review: Patient is an 80 year old female posted for left second thru fourth hammertoe correction and Weil osteotomy and dorsal capsulotomy on 01/15/13 by Dr. Victorino DikeHewitt.  She is s/p similar procedure on the right on 07/17/12.    History includes nonsmoker, CAD s/p RCA '03 and CX '05 stents, ischemic cardiomyopathy, chronic systolic CHF, St. Jude AICD (CRT-D) 11/16/09, paroxysmal ventricular tachycardia, hypertension, hyperlipidemia, GERD, hiatal hernia, esophageal stricture, arthritis, hypothyroidism, anxiety, recurrent UTIs, TKA, tonsillectomy, hysterectomy. PCP is Dr. Beverely Lowabori. Cardiologist is Dr. Graciela HusbandsKlein.   Both had cleared patient prior to her last hammertoe surgery in 07/2012.   EKG on 01/21/12 showed NSR, v-paced, cannot rule out anterior infarct (age undetermined).   She had a nuclear stress test on 01/23/12 due to chest pain. Results demonstrated no significant ischemia (inferior and apical defects felt to be attenuation artifact). Ejection fraction is measured at 35% although visualization thought to be about 55-60% with normal wall motion. Low risk scan.   Echo on 06/05/06 showed mildly reduced LV systolic function, EF 45%, severe hypokinesis of the inferoposterior wall, mild hypokinesis of the anterior wall, moderate to severe MR, moderately dilated LA, estimated peak pulmonary artery systolic pressure was mildly increased.   Last cardiac cath on 10/06//05 showed: RCA stent site patent, PL branch of the RCA 50%, dCFX 80% treated with Taxus DES. Chronically occluded LAD. See full report in Epic.   Carotid duplex on 01/27/09 showed < 50% bilateral ICA stenosis.  CXR on 07/09/12 showed chronic elevation of the right hemidiaphragm, thoracic aortic atherosclerotic calcifications, three cardiac device leads, no active disease.   Preoperative labs noted.  Reviewed with anesthesiologist Dr. Michelle Piperssey.  She tolerated procedure six months ago.  Further evaluation by her assigned anesthesiologist on  the day of surgery.  If no acute changes then anticipate that she can proceed as planned.  Tracy Ochsllison Rigel Filsinger, PA-C [facility]Anesthesiology Phone [phone] 01/14/2013 9:56 AM

## 2013-01-14 ENCOUNTER — Other Ambulatory Visit: Payer: Self-pay | Admitting: Orthopedic Surgery

## 2013-01-14 MED ORDER — CEFAZOLIN SODIUM-DEXTROSE 2-3 GM-% IV SOLR
2.0000 g | INTRAVENOUS | Status: AC
  Administered 2013-01-15: 2 g via INTRAVENOUS

## 2013-01-14 NOTE — H&P (Signed)
[name] is an 80 y.o. female.   Chief Complaint: Left 2-4 hammertoes HPI: Pt presents to OR for correction of left 2-4 hammertoes due to conservative treatment failure.  Pt is ambulatory and denies N/V/F/C, chest pain, SOB, calf pain, and paresthesia bilaterally.  Past Medical History  Diagnosis Date  . Chest pain 12/09    recent hospitalization for it. felt to be noncardiac.   [name] CAD (coronary artery disease)     a. s/p prior anterior wall myocardial infarction and multiple percutaneous coronary intervention.;  b. Aden MV 1/14:  low risk, inf and apical defect likely due to atten artifact, no ischemia, EF 35% (visually normal and est 55-60%)  . Ischemic cardiomyopathy     EF 25-30% improved to 45% with biventricular pacing.   . Systolic heart failure     improved to class I to II   . Automatic implantable cardioverter/defibrillator (AICD) activation     s/p implantable cardioverter-defibrillator bivenrticular pacer. 12/05. St, Jude [employer]. remote-no.  [name] HTN (hypertension)   . Hyperlipidemia   . GERD (gastroesophageal reflux disease)   . Esophageal stricture     last dialated 2007  . Hiatal hernia   . DD (diverticular disease)     Hx of it - severe. Left colon 2004  . Other urinary problems     urinary incontenance  . UTI (lower urinary tract infection)     recurrent. (Klebsiella pneumoniae). Last Cx 02/03/09. (R-ancef, nitrofurantoin, Augmentin, Zyosyn)  . Arthritis   . PVT (paroxysmal ventricular tachycardia)   . CHF (congestive heart failure)   . Hypothyroidism   . Anxiety     sleep  . Pacemaker   . Automatic implantable cardioverter-defibrillator in situ   . Heart murmur   . History of recurrent UTIs   . Shortness of breath     with exertion  . Urgency of urination   . Frequent urination at night     Past Surgical History  Procedure Laterality Date  . Angioplasty      stent  . Unspecified area hysterectomy    . Tubal ligation    . Appendectomy    .  Cardiac defibrillator placement      [facility]  . Cataract extraction, bilateral    . Total knee arthroplasty      bilateral  . Tonsillectomy    . Hammertoe reconstruction with weil osteotomy Right 07/17/2012    Procedure: RIGHT SECOND AND THIRD MT WEIL OSTEOTOMIES AND SECOND AND THIRD HAMMERTOE CORRECTIONS;  Surgeon: Toni Arthurs, MD;  Location: [facility] OR;  Service: Orthopedics;  Laterality: Right;  . Colonoscopy w/ polypectomy    . Cardiac catheterization      stent  . Insert / replace / remove pacemaker      Family History  Problem Relation Age of Onset  . Colon cancer Neg Hx   . Colon polyps Sister   . Heart disease Mother   . Heart disease Father   . Heart disease Brother     multiple brothers  . Heart disease Sister     multiple sisters  . Prostate cancer Brother   . Diabetes Daughter   . Diabetes      aunt  . Breast cancer Daughter   . Alcohol abuse Son    Social History:  reports that she has never smoked. She has never used smokeless tobacco. She reports that she does not drink alcohol or use illicit drugs.  Allergies:  Allergies  Allergen Reactions  .  Sulfonamide Derivatives     REACTION: rash and itching    No prescriptions prior to admission    No results found for this or any previous visit (from the past 48 hour(s)). No results found.  Review of Systems  Constitutional: Negative.   HENT: Negative.   Eyes: Negative.   Respiratory: Negative.   Cardiovascular: Negative.   Gastrointestinal: Negative.   Musculoskeletal: Negative.   Skin: Negative.   Endo/Heme/Allergies: Negative.   Psychiatric/Behavioral: The patient does not have insomnia.     There were no vitals taken for this visit. Physical Exam  WD WN 6089 female NAD, A/Ox3, appears stated age.  EOMI, mood and affect normal, respirations unlabored. The left foot has hammertoe deformities of toes 2-4 with fixed PIP flexion contractures and calluses at the MT heads.  DP pulse 2+ bilaterally.   Distal toes well perfused with cap refill <2sec.  Sensibility to light touch is intact throughout and bilaterally.  Assessment/Plan Today pt will have correction of left 2-4 hammertoes performed by Dr. Victorino DikeHewitt.  All questions and concerns have been addressed with pt at pre-op appt.  The risks and benefits of the alternative treatment options have been discussed in detail.  The patient wishes to proceed with surgery and specifically understands risks of bleeding, infection, nerve damage, blood clots, need for additional surgery, amputation and death.    FLOWERS, CHRISTOPHER S 01/14/2013, 5:28 PM   I agree with the note above.  Pt presents now for operative treatment of her left 2=4 hammertoes.  The risks and benefits of the alternative treatment options have been discussed in detail.  The patient wishes to proceed with surgery and specifically understands risks of bleeding, infection, nerve damage, blood clots, need for additional surgery, amputation and death.

## 2013-01-15 ENCOUNTER — Ambulatory Visit (HOSPITAL_COMMUNITY): Payer: Medicare Other | Admitting: Vascular Surgery

## 2013-01-15 ENCOUNTER — Ambulatory Visit (HOSPITAL_COMMUNITY)
Admission: RE | Admit: 2013-01-15 | Discharge: 2013-01-15 | Disposition: A | Discharge status: Home / Self Care | Payer: Medicare Other | Source: Ambulatory Visit | Attending: Orthopedic Surgery | Admitting: Orthopedic Surgery

## 2013-01-15 ENCOUNTER — Encounter (HOSPITAL_COMMUNITY): Payer: Medicare Other | Admitting: Vascular Surgery

## 2013-01-15 ENCOUNTER — Encounter (HOSPITAL_COMMUNITY): Payer: Self-pay | Admitting: *Deleted

## 2013-01-15 ENCOUNTER — Encounter (HOSPITAL_COMMUNITY)
Admission: RE | Disposition: A | Discharge status: Home / Self Care | Payer: Self-pay | Source: Ambulatory Visit | Attending: Orthopedic Surgery

## 2013-01-15 DIAGNOSIS — G8918 Other acute postprocedural pain: Secondary | ICD-10-CM | POA: Diagnosis not present

## 2013-01-15 DIAGNOSIS — Z01812 Encounter for preprocedural laboratory examination: Secondary | ICD-10-CM | POA: Insufficient documentation

## 2013-01-15 DIAGNOSIS — I10 Essential (primary) hypertension: Secondary | ICD-10-CM | POA: Insufficient documentation

## 2013-01-15 DIAGNOSIS — I2510 Atherosclerotic heart disease of native coronary artery without angina pectoris: Secondary | ICD-10-CM | POA: Diagnosis not present

## 2013-01-15 DIAGNOSIS — I509 Heart failure, unspecified: Secondary | ICD-10-CM | POA: Diagnosis not present

## 2013-01-15 DIAGNOSIS — M2040 Other hammer toe(s) (acquired), unspecified foot: Secondary | ICD-10-CM | POA: Diagnosis not present

## 2013-01-15 DIAGNOSIS — I5020 Unspecified systolic (congestive) heart failure: Secondary | ICD-10-CM | POA: Insufficient documentation

## 2013-01-15 DIAGNOSIS — Z95810 Presence of automatic (implantable) cardiac defibrillator: Secondary | ICD-10-CM | POA: Insufficient documentation

## 2013-01-15 DIAGNOSIS — I2589 Other forms of chronic ischemic heart disease: Secondary | ICD-10-CM | POA: Insufficient documentation

## 2013-01-15 DIAGNOSIS — K219 Gastro-esophageal reflux disease without esophagitis: Secondary | ICD-10-CM | POA: Insufficient documentation

## 2013-01-15 DIAGNOSIS — M2042 Other hammer toe(s) (acquired), left foot: Secondary | ICD-10-CM

## 2013-01-15 HISTORY — PX: HAMMER TOE SURGERY: SHX385

## 2013-01-15 HISTORY — PX: WEIL OSTEOTOMY: SHX5044

## 2013-01-15 SURGERY — CORRECTION, HAMMER TOE
Anesthesia: General | Site: Toe | Laterality: Left

## 2013-01-15 MED ORDER — OXYCODONE HCL 5 MG PO TABS: 5.0000 mg | ORAL_TABLET | Freq: Once | ORAL | Status: DC | PRN

## 2013-01-15 MED ORDER — EPHEDRINE SULFATE 50 MG/ML IJ SOLN
INTRAMUSCULAR | Status: DC | PRN
  Administered 2013-01-15: 5 mg via INTRAVENOUS
  Administered 2013-01-15 (×2): 10 mg via INTRAVENOUS

## 2013-01-15 MED ORDER — FENTANYL CITRATE 0.05 MG/ML IJ SOLN
INTRAMUSCULAR | Status: AC
  Administered 2013-01-15: 50 ug via INTRAVENOUS
  Filled 2013-01-15: qty 2

## 2013-01-15 MED ORDER — BACITRACIN ZINC 500 UNIT/GM EX OINT
TOPICAL_OINTMENT | CUTANEOUS | Status: DC | PRN
  Administered 2013-01-15: 1 via TOPICAL

## 2013-01-15 MED ORDER — OXYCODONE HCL 5 MG PO TABS
5.0000 mg | ORAL_TABLET | ORAL | Status: DC | PRN
Start: 2013-01-15 — End: 2013-01-15

## 2013-01-15 MED ORDER — ACETAMINOPHEN 500 MG PO TABS
1000.0000 mg | ORAL_TABLET | Freq: Once | ORAL | Status: AC
  Administered 2013-01-15: 1000 mg via ORAL
  Filled 2013-01-15: qty 2

## 2013-01-15 MED ORDER — HYDROMORPHONE HCL PF 1 MG/ML IJ SOLN: 0.2500 mg | INTRAMUSCULAR | Status: DC | PRN

## 2013-01-15 MED ORDER — MEPERIDINE HCL 25 MG/ML IJ SOLN: 6.2500 mg | INTRAMUSCULAR | Status: DC | PRN

## 2013-01-15 MED ORDER — 0.9 % SODIUM CHLORIDE (POUR BTL) OPTIME
TOPICAL | Status: DC | PRN
  Administered 2013-01-15: 1000 mL

## 2013-01-15 MED ORDER — MIDAZOLAM HCL 2 MG/2ML IJ SOLN
INTRAMUSCULAR | Status: AC
  Administered 2013-01-15: 1 mg via INTRAVENOUS
  Filled 2013-01-15: qty 2

## 2013-01-15 MED ORDER — ONDANSETRON HCL 4 MG/2ML IJ SOLN
INTRAMUSCULAR | Status: DC | PRN
  Administered 2013-01-15: 4 mg via INTRAVENOUS

## 2013-01-15 MED ORDER — PROPOFOL INFUSION 10 MG/ML OPTIME
INTRAVENOUS | Status: DC | PRN
  Administered 2013-01-15: 25 ug/kg/min via INTRAVENOUS

## 2013-01-15 MED ORDER — LACTATED RINGERS IV SOLN
INTRAVENOUS | Status: DC
  Administered 2013-01-15: 11:00:00 via INTRAVENOUS

## 2013-01-15 MED ORDER — FENTANYL CITRATE 0.05 MG/ML IJ SOLN
INTRAMUSCULAR | Status: DC | PRN
Start: 2013-01-15 — End: 2013-01-15
  Administered 2013-01-15: 25 ug via INTRAVENOUS

## 2013-01-15 MED ORDER — SODIUM CHLORIDE 0.9 % IV SOLN: INTRAVENOUS | Status: DC

## 2013-01-15 MED ORDER — OXYCODONE HCL 5 MG/5ML PO SOLN: 5.0000 mg | Freq: Once | ORAL | Status: DC | PRN

## 2013-01-15 MED ORDER — BACITRACIN ZINC 500 UNIT/GM EX OINT
TOPICAL_OINTMENT | CUTANEOUS | Status: AC
  Filled 2013-01-15: qty 30

## 2013-01-15 MED ORDER — ONDANSETRON HCL 4 MG/2ML IJ SOLN: 4.0000 mg | Freq: Once | INTRAMUSCULAR | Status: DC | PRN

## 2013-01-15 MED ORDER — CHLORHEXIDINE GLUCONATE 4 % EX LIQD: 60.0000 mL | Freq: Once | CUTANEOUS | Status: DC

## 2013-01-15 MED ORDER — CEFAZOLIN SODIUM-DEXTROSE 2-3 GM-% IV SOLR
INTRAVENOUS | Status: AC
  Filled 2013-01-15: qty 50

## 2013-01-15 MED ORDER — HYDROCODONE-ACETAMINOPHEN 5-325 MG PO TABS: 1.0000 | ORAL_TABLET | Freq: Four times a day (QID) | ORAL | Status: DC | PRN

## 2013-01-15 SURGICAL SUPPLY — 61 items
BANDAGE CONFORM 3  STR LF (GAUZE/BANDAGES/DRESSINGS) ×3 IMPLANT
BANDAGE ELASTIC 4 VELCRO ST LF (GAUZE/BANDAGES/DRESSINGS) ×3 IMPLANT
BLADE AVERAGE 25MMX9MM (BLADE) ×1
BLADE AVERAGE 25X9 (BLADE) ×2 IMPLANT
BLADE PRO-TOE VO 2.0X13-10 (Orthopedic Implant) ×1 IMPLANT
BLADE PRO-TOE VO 2.0X13MM-10 (Orthopedic Implant) ×1 IMPLANT
BNDG CMPR 9X4 STRL LF SNTH (GAUZE/BANDAGES/DRESSINGS) ×1
BNDG COHESIVE 4X5 TAN STRL (GAUZE/BANDAGES/DRESSINGS) ×3 IMPLANT
BNDG COHESIVE 6X5 TAN STRL LF (GAUZE/BANDAGES/DRESSINGS) ×3 IMPLANT
BNDG ESMARK 4X9 LF (GAUZE/BANDAGES/DRESSINGS) ×3 IMPLANT
CHLORAPREP W/TINT 26ML (MISCELLANEOUS) ×3 IMPLANT
CLOTH BEACON ORANGE TIMEOUT ST (SAFETY) ×3 IMPLANT
COVER SURGICAL LIGHT HANDLE (MISCELLANEOUS) ×3 IMPLANT
CUFF TOURNIQUET SINGLE 18IN (TOURNIQUET CUFF) IMPLANT
CUFF TOURNIQUET SINGLE 34IN LL (TOURNIQUET CUFF) ×3 IMPLANT
DRAPE OEC MINIVIEW 54X84 (DRAPES) ×3 IMPLANT
DRAPE U-SHAPE 47X51 STRL (DRAPES) ×3 IMPLANT
DRSG EMULSION OIL 3X3 NADH (GAUZE/BANDAGES/DRESSINGS) ×3 IMPLANT
DRSG PAD ABDOMINAL 8X10 ST (GAUZE/BANDAGES/DRESSINGS) ×3 IMPLANT
ELECT REM PT RETURN 9FT ADLT (ELECTROSURGICAL) ×3
ELECTRODE REM PT RTRN 9FT ADLT (ELECTROSURGICAL) ×1 IMPLANT
GLOVE BIO SURGEON STRL SZ7 (GLOVE) ×3 IMPLANT
GLOVE BIO SURGEON STRL SZ8 (GLOVE) ×3 IMPLANT
GLOVE BIOGEL PI IND STRL 6.5 (GLOVE) IMPLANT
GLOVE BIOGEL PI IND STRL 7.0 (GLOVE) IMPLANT
GLOVE BIOGEL PI IND STRL 7.5 (GLOVE) ×1 IMPLANT
GLOVE BIOGEL PI IND STRL 8 (GLOVE) ×1 IMPLANT
GLOVE BIOGEL PI INDICATOR 6.5 (GLOVE) ×4
GLOVE BIOGEL PI INDICATOR 7.0 (GLOVE) ×2
GLOVE BIOGEL PI INDICATOR 7.5 (GLOVE) ×2
GLOVE BIOGEL PI INDICATOR 8 (GLOVE) ×2
GLOVE SURG SS PI 7.0 STRL IVOR (GLOVE) ×4 IMPLANT
GOWN PREVENTION PLUS XLARGE (GOWN DISPOSABLE) ×3 IMPLANT
GOWN STRL NON-REIN LRG LVL3 (GOWN DISPOSABLE) ×3 IMPLANT
IMPLANT PRO-TOE HAMMERTOE SM10 (Toe) ×4 IMPLANT
KIT BASIN OR (CUSTOM PROCEDURE TRAY) ×3 IMPLANT
KIT INSTRUMENT PRO-TOE VO (KITS) ×2 IMPLANT
KIT ROOM TURNOVER OR (KITS) ×3 IMPLANT
MANIFOLD NEPTUNE II (INSTRUMENTS) ×3 IMPLANT
NEEDLE HYPO 22GX1.5 SAFETY (NEEDLE) ×3 IMPLANT
NS IRRIG 1000ML POUR BTL (IV SOLUTION) ×3 IMPLANT
PACK ORTHO EXTREMITY (CUSTOM PROCEDURE TRAY) ×3 IMPLANT
PAD ARMBOARD 7.5X6 YLW CONV (MISCELLANEOUS) ×6 IMPLANT
PAD CAST 4YDX4 CTTN HI CHSV (CAST SUPPLIES) ×1 IMPLANT
PADDING CAST ABS 4INX4YD NS (CAST SUPPLIES) ×2
PADDING CAST ABS COTTON 4X4 ST (CAST SUPPLIES) IMPLANT
PADDING CAST COTTON 4X4 STRL (CAST SUPPLIES) ×3
PRO-TOE VO INSTRUMENT  KIT ×2 IMPLANT
SPONGE GAUZE 4X4 12PLY (GAUZE/BANDAGES/DRESSINGS) ×3 IMPLANT
SUT ETHILON 3 0 FSL (SUTURE) ×3 IMPLANT
SUT ETHILON 3 0 PS 1 (SUTURE) ×3 IMPLANT
SUT MNCRL AB 3-0 PS2 18 (SUTURE) ×3 IMPLANT
SUT VIC AB 2-0 CT1 27 (SUTURE) ×6
SUT VIC AB 2-0 CT1 TAPERPNT 27 (SUTURE) ×1 IMPLANT
SYR CONTROL 10ML LL (SYRINGE) ×3 IMPLANT
TOWEL OR 17X24 6PK STRL BLUE (TOWEL DISPOSABLE) ×3 IMPLANT
TOWEL OR 17X26 10 PK STRL BLUE (TOWEL DISPOSABLE) ×3 IMPLANT
TUBE CONNECTING 20'X1/4 (TUBING) ×1
TUBE CONNECTING 20X1/4 (TUBING) ×2 IMPLANT
TWIST OFF SCREW 12MM (Screw) ×2 IMPLANT
UNDERPAD 30X30 INCONTINENT (UNDERPADS AND DIAPERS) ×3 IMPLANT

## 2013-01-15 NOTE — Brief Op Note (Signed)
01/15/2013  1:58 PM  PATIENT:  [name]  80 y.o. female  PRE-OPERATIVE DIAGNOSIS:  Left second through fourth hammer toes  POST-OPERATIVE DIAGNOSIS:  Left second through fourth hammer toes  Procedure(s): 1.  Left 2nd - 4th MT weil osteotomies 2.  Left 2nd - 4th dorsal MTPJ dorsal capsulotomies and extensor tendon lengthening 3.  Left 2nd - 4th hammertoe corrections 4.  Left 2nd toe flexor to extensor transfers 5.  Left foot AP and lateral radiographs  SURGEON:  Toni ArthursJohn Nyleah Mcginnis, MD  ASSISTANT: Scott FLowers, PA-C  ANESTHESIA:   Regional, MAC  EBL:  minimal   TOURNIQUET:   Total Tourniquet Time Documented: Thigh (Left) - 68 minutes Total: Thigh (Left) - 68 minutes   COMPLICATIONS:  None apparent  DISPOSITION:  Extubated, awake and stable to recovery.  DICTATION ID:  [id number]

## 2013-01-15 NOTE — Discharge Instructions (Signed)
Toni ArthursJohn Hewitt, MD [facility]  Please read the following information regarding your care after surgery.  Medications  You only need a prescription for the narcotic pain medicine (ex. oxycodone, Percocet, Norco).  All of the other medicines listed below are available over the counter. X acetominophen (Tylenol) 650 mg every 4-6 hours as you need for minor pain OR X Hydrocodone as needed for severe pain  Narcotic pain medicine (ex. oxycodone, Percocet, Vicodin) will cause constipation.  To prevent this problem, take the following medicines while you are taking any pain medicine. X docusate sodium (Colace) 100 mg twice a day X senna (Senokot) 2 tablets twice a day  Weight Bearing X Bear weight only on the heel of your operated foot in the post-op shoe.  Cast / Splint / Dressing X Keep your splint or cast clean and dry.  Dont put anything (coat hanger, pencil, etc) down inside of it.  If it gets damp, use a hair dryer on the cool setting to dry it.  If it gets soaked, call the office to schedule an appointment for a cast change. ? Remove your dressing 3 days after surgery and cover the incisions with dry dressings.    After your dressing, cast or splint is removed; you may shower, but do not soak or scrub the wound.  Allow the water to run over it, and then gently pat it dry.  Swelling It is normal for you to have swelling where you had surgery.  To reduce swelling and pain, keep your toes above your nose for at least 3 days after surgery.  It may be necessary to keep your foot or leg elevated for several weeks.  If it hurts, it should be elevated.  Follow Up Call my office at [phone] when you are discharged from the hospital or surgery center to schedule an appointment to be seen two weeks after surgery.  Call my office at [phone] if you develop a fever >101.5 F, nausea, vomiting, bleeding from the surgical site or severe pain.     What to eat:  For your first  meals, you should eat lightly; only small meals initially.  If you do not have nausea, you may eat larger meals.  Avoid spicy, greasy and heavy food.    General Anesthesia, Adult, Care After  Refer to this sheet in the next few weeks. These instructions provide you with information on caring for yourself after your procedure. Your health care provider may also give you more specific instructions. Your treatment has been planned according to current medical practices, but problems sometimes occur. Call your health care provider if you have any problems or questions after your procedure.  WHAT TO EXPECT AFTER THE PROCEDURE  After the procedure, it is typical to experience:  Sleepiness.  Nausea and vomiting. HOME CARE INSTRUCTIONS  For the first 24 hours after general anesthesia:  Have a responsible person with you.  Do not drive a car. If you are alone, do not take public transportation.  Do not drink alcohol.  Do not take medicine that has not been prescribed by your health care provider.  Do not sign important papers or make important decisions.  You may resume a normal diet and activities as directed by your health care provider.  Change bandages (dressings) as directed.  If you have questions or problems that seem related to general anesthesia, call the hospital and ask for the anesthetist or anesthesiologist on call. SEEK MEDICAL CARE IF:  You have nausea  and vomiting that continue the day after anesthesia.  You develop a rash. SEEK IMMEDIATE MEDICAL CARE IF:  You have difficulty breathing.  You have chest pain.  You have any allergic problems. Document Released: 04/02/2000 Document Revised: 08/27/2012 Document Reviewed: 07/10/2012  [facility] Patient Information 2014 [place], [place].

## 2013-01-15 NOTE — Transfer of Care (Signed)
Immediate Anesthesia Transfer of Care Note  Patient: [name]  Procedure(s) Performed: Procedure(s): LEFT SECOND THROUGH FOURTH HAMMERTOE CORRECTION  (Left)  WEIL OSTEOTOMY AND DORSAL CAPSULOTOMY (Left)  Patient Location: PACU  Anesthesia Type:MAC combined with regional for post-op pain  Level of Consciousness: awake, alert  and oriented  Airway & Oxygen Therapy: Patient Spontanous Breathing and Patient connected to nasal cannula oxygen  Post-op Assessment: Report given to PACU RN, Post -op Vital signs reviewed and stable and Patient moving all extremities X 4  Post vital signs: Reviewed and stable  Complications: No apparent anesthesia complications

## 2013-01-15 NOTE — Preoperative (Signed)
Beta Blockers   Reason not to administer Beta Blockers:Not Applicable, pt took 01/15/13

## 2013-01-15 NOTE — Anesthesia Preprocedure Evaluation (Addendum)
Anesthesia Evaluation  Patient identified by MRN, date of birth, ID band Patient awake    Reviewed: Allergy & Precautions, H&P , NPO status , Patient's Chart, lab work & pertinent test results, reviewed documented beta blocker date and time   Airway Mallampati: I TM Distance: >3 FB Neck ROM: Full    Dental  (+) Dental Advisory Given and Teeth Intact   Pulmonary shortness of breath and with exertion,          Cardiovascular hypertension, Pt. on home beta blockers and Pt. on medications + CAD, + Cardiac Stents and +CHF + dysrhythmias + pacemaker + Cardiac Defibrillator + Valvular Problems/Murmurs MR  Echo on 06/05/06 showed mildly reduced LV systolic function, EF 45%, severe hypokinesis of the inferoposterior wall, mild hypokinesis of the anterior wall, moderate to severe MR, moderately dilated LA, estimated peak pulmonary artery systolic pressure was mildly increased.   1/14 - Overall Impression:  Low risk stress nuclear study.  The inferior and apical defects are likely due to attenuation artifact.    LV Ejection Fraction: 35%.   The actual EF is higher than the 35% obtained by the computer (by visual estimation the EF is around 55-60%). LV Wall Motion:  NL LV Function; NL Wall Motion.    Neuro/Psych  Headaches, PSYCHIATRIC DISORDERS Anxiety    GI/Hepatic hiatal hernia, GERD-  ,  Endo/Other  Hypothyroidism   Renal/GU      Musculoskeletal   Abdominal   Peds  Hematology   Anesthesia Other Findings   Reproductive/Obstetrics                       Anesthesia Physical Anesthesia Plan  ASA: III  Anesthesia Plan: MAC and Regional   Post-op Pain Management:    Induction:   Airway Management Planned: Simple Face Mask  Additional Equipment:   Intra-op Plan:   Post-operative Plan:   Informed Consent: I have reviewed the patients History and Physical, chart, labs and discussed the procedure including  the risks, benefits and alternatives for the proposed anesthesia with the patient or authorized representative who has indicated his/her understanding and acceptance.   Dental advisory given  Plan Discussed with: Anesthesiologist and Surgeon  Anesthesia Plan Comments:         Anesthesia Quick Evaluation

## 2013-01-15 NOTE — Anesthesia Procedure Notes (Addendum)
Anesthesia Regional Block:  Popliteal block  Pre-Anesthetic Checklist: ,, timeout performed, Correct Patient, Correct Site, Correct Laterality, Correct Procedure, Correct Position, site marked, Risks and benefits discussed,  Surgical consent,  Pre-op evaluation,  At surgeon's request and post-op pain management  Laterality: Left  Prep: chloraprep       Needles:  Injection technique: Single-shot  Needle Type: Echogenic Stimulator Needle     Needle Length: 10cm 10 cm Needle Gauge: 21 and 21 G    Additional Needles:  Procedures: ultrasound guided (picture in chart) and nerve stimulator Popliteal block  Nerve Stimulator or Paresthesia:  Response: 0.4 mA,   Additional Responses:   Narrative:  Start time: 01/15/2013 12:00 PM End time: 01/15/2013 12:20 PM Injection made incrementally with aspirations every 5 mL.  Performed by: Personally  Anesthesiologist: Arta BruceKevin Ossey MD  Additional Notes: Monitors applied. Patient sedated. Sterile prep and drape,hand hygiene and sterile gloves were used. Relevant anatomy identified.Needle position confirmed.Local anesthetic injected incrementally after negative aspiration. Local anesthetic spread visualized around nerve(s). Vascular puncture avoided. No complications. Image printed for medical record.The patient tolerated the procedure well.  Adductor Canal Block  Inner thigh prepped. Ultrasound imaging of the Femoral Artery was performed and nerve in the Adductor Canal was identified. 20cc of local anesthetic was injected and observed bathing the nerve. No problems encountered. Image placed on the chart.  Arta BruceKevin Ossey MD    [facility] Date/Time: 01/15/2013 12:22 PM Performed by: Elon AlasLEE, Curry Seefeldt BROWN Pre-anesthesia Checklist: Patient identified, Timeout performed, Emergency Drugs available, Suction available and Patient being monitored Patient Re-evaluated:Patient Re-evaluated prior to inductionOxygen Delivery Method: Simple face  mask Intubation Type: IV induction Placement Confirmation: positive ETCO2 and breath sounds checked- equal and bilateral Tube secured with: Tape Dental Injury: Teeth and Oropharynx as per pre-operative assessment

## 2013-01-15 NOTE — Progress Notes (Signed)
Orthopedic Tech Progress Note Patient Details:  [name] [birth year] [id number]  Ortho Devices Type of Ortho Device: Darco shoe Ortho Device/Splint Location: lle Ortho Device/Splint Interventions: Application   Nikki DomCrawford, Mekayla Soman 01/15/2013, 2:35 PM

## 2013-01-15 NOTE — Anesthesia Postprocedure Evaluation (Signed)
Anesthesia Post Note  Patient: [name]  Procedure(s) Performed: Procedure(s) (LRB): LEFT SECOND THROUGH FOURTH HAMMERTOE CORRECTION  (Left)  WEIL OSTEOTOMY AND DORSAL CAPSULOTOMY (Left)  Anesthesia type: general  Patient location: PACU  Post pain: Pain level controlled  Post assessment: Patient's Cardiovascular Status Stable  Last Vitals:  Filed Vitals:   01/15/13 1500  BP: 130/69  Pulse: 79  Temp:   Resp: 15    Post vital signs: Reviewed and stable  Level of consciousness: sedated  Complications: No apparent anesthesia complications

## 2013-01-16 NOTE — Op Note (Signed)
NAMAndres Shad:  [name], [name]             ACCOUNT NO.:  [id number]  MEDICAL RECORD NO.:  [id number]  LOCATION:  [facility]                         FACILITY:  [facility]  PHYSICIAN:  Tracy ArthursJohn Francois Elk, MD        DATE OF BIRTH:  1921/09/05  DATE OF PROCEDURE:  01/15/2013 DATE OF DISCHARGE:  01/15/2013                              OPERATIVE REPORT   PREOPERATIVE DIAGNOSIS:  Left 2nd, 3rd, and 4th hammertoes.  POSTOPERATIVE DIAGNOSIS:  Left 2nd, 3rd, and 4th hammertoes.  PROCEDURE: 1. Left 2nd, 3rd, and 4th metatarsal Weil osteotomies. 2. Left 2nd, 3rd, and 4th dorsal metatarsophalangeal joint     capsulotomies with extensor tendon lengthening. 3. Left 2nd, 3rd, and 4th hammertoe corrections (proximal     interphalangeal joint arthrodeses). 4. Left second toe flexor to extensor transfer. 5. Left foot AP and lateral radiographs. 6. Left 5th toe percutaneous flexor digitorum longus tenotomy  SURGEON:  Tracy ArthursJohn Shandrea Lusk, MD  ASSISTANT:  Tracy PicketScott Flowers, PA-C.  ANESTHESIA:  Regional, MAC.  ESTIMATED BLOOD LOSS:  Minimal.  TOURNIQUET TIME:  68 minutes at 250 mmHg.  COMPLICATIONS:  None apparent.  DISPOSITION:  Extubated awake and stable to recovery.  INDICATIONS FOR PROCEDURE:  The patient is an 80 year old woman who complains of a painful left forefoot deformity.  She has failed nonoperative treatment to date including activity modification, oral anti-inflammatory, shoe wear modification, and toe spacers.  She presents now for operative treatment of this painful condition.  She understands the risks and benefits, the alternative treatment options, and elects surgical treatment.  She specifically understands risks of bleeding, infection, nerve damage, blood clots, need for additional surgery, amputation, and death.  PROCEDURE IN DETAIL:  After preoperative consent was obtained and the correct operative site was identified, the patient was brought to the operating room and placed supine on the operating  table.  Regional anesthesia had previously been administered and IV sedation was administered.  Preoperative antibiotics were administered.  Surgical time-out was taken.  The left lower extremity was prepped and draped in standard sterile fashion with a tourniquet around the thigh.  The extremity was exsanguinated.  The tourniquet was inflated to 250 mmHg. A longitudinal incision was made over the second MTP joint.  Sharp dissection was carried down through the skin and subcutaneous tissue. Extensor digitorum longus and brevis tendons were lengthened in Z fashion.  The dorsal joint capsule was excised in its entirety.  Second metatarsal head was exposed and a Weil osteotomy was made with an oscillating saw removing a small wedge of bone distally.  The metatarsal head was allowed to retract proximally several mm and the osteotomy was fixed with a 2-mm partially threaded cancellous screw.  The overhanging bone was trimmed with a rongeur.  The same Weil osteotomy and dorsal capsulotomy with extensor tendon lengthening was then performed for the 3rd and 4th MTP joints.  Attention was then turned to the second toe where a transverse incision was made across the PIP joint.  Sharp dissection was carried down through the skin and extensor mechanism.  The collateral ligaments were released.  The head of the proximal phalanx was resected with an oscillating saw followed by the base of the middle  phalanx.  A [employer] Pro-Toe implant was selected and driven into the proximal phalanx.  The middle phalanx was then reduced over the device and reduced appropriately.  Horizontal mattress sutures of 3-0 nylon were used to approximate the extensor tendon the skin over the PIP joint. The same hammertoe correction with PIP joint arthrodesis was performed for the third and fourth toes.  At this point, AP and lateral radiograph showed appropriate position and length of all hardware and  appropriate reduction of the PIP joints.  The second toe still had a tendency to be dorsiflexed slightly.  The decision was made to proceed with the second toe flexor to extensor transfer.  An oblique incision was made on the plantar aspect of the distal forefoot adjacent to the second toe.  Sharp dissection was carried down through the skin and subcutaneous tissue.  Flexor tendon sheath was incised and the flexor digitorum longus tendon was isolated and was released percutaneously at its insertion on the distal phalanx.  This tendon was then brought out through the proximal incision and split longitudinally.  2-0 Vicryl tagging sutures were placed in the ends of the tendon and they were passed up along the base of the proximal phalanx through the dorsal incision.  The toe was reduced and the tendon was repaired to itself with 2-0 Vicryl figure-of-eight sutures.  The tendon ends were then trimmed.  The plantar incision was closed with horizontal mattress sutures of 3-0 nylon.  Wounds were all irrigated copiously.  The extensor tendons were repaired dorsally with simple sutures of 2-0 Vicryl.  Skin incisions were then closed with horizontal mattress sutures of 3-0 nylon.  The 5th toe still was noted to be in a plantar flexed position.  A percutaneous flexor digitorum longus tendon release was then performed at the level of the distal flexion crease. Toe was then able to be positioned in a neutral position.  Sterile dressings were applied followed by a well-padded compression wrap.  The tourniquet was released at 1 hour and 8 minutes.  The patient was awakened from anesthesia and transported to the recovery room in stable condition.  FOLLOWUP PLAN:  The patient will be weightbearing as tolerated on her heel in a Darco style shoe.  She will follow up with me in 2 weeks for suture removal.  RADIOGRAPHS:  AP and lateral radiographs of the left foot were obtained intraoperatively today.   These show interval correction of the 2nd, 3rd, and 4th hammertoe deformities with appropriately positioned hardware. No evidence of acute injury is noted.  Tracy Flowers, PA-C was present and scrubbed for the duration of the case.  His assistance was essential in gaining and maintaining exposure, performing the operation, closing the wounds, and applying the dressings.     Tracy Arthurs, MD    JH/MEDQ  D:  01/15/2013  T:  01/16/2013  Job:  [id number]

## 2013-01-21 ENCOUNTER — Encounter (HOSPITAL_COMMUNITY): Payer: Self-pay | Admitting: Orthopedic Surgery

## 2013-01-27 ENCOUNTER — Encounter: Payer: Medicare Other | Admitting: Speech Pathology

## 2013-01-29 ENCOUNTER — Encounter: Payer: Medicare Other | Admitting: Speech Pathology

## 2013-02-09 ENCOUNTER — Encounter (HOSPITAL_BASED_OUTPATIENT_CLINIC_OR_DEPARTMENT_OTHER): Payer: Self-pay | Admitting: Emergency Medicine

## 2013-02-09 ENCOUNTER — Emergency Department (HOSPITAL_BASED_OUTPATIENT_CLINIC_OR_DEPARTMENT_OTHER)
Admission: EM | Admit: 2013-02-09 | Discharge: 2013-02-09 | Disposition: A | Discharge status: Home / Self Care | Payer: Medicare Other | Attending: Emergency Medicine | Admitting: Emergency Medicine

## 2013-02-09 ENCOUNTER — Emergency Department (HOSPITAL_BASED_OUTPATIENT_CLINIC_OR_DEPARTMENT_OTHER): Payer: Medicare Other

## 2013-02-09 DIAGNOSIS — S0083XA Contusion of other part of head, initial encounter: Secondary | ICD-10-CM

## 2013-02-09 DIAGNOSIS — E039 Hypothyroidism, unspecified: Secondary | ICD-10-CM | POA: Insufficient documentation

## 2013-02-09 DIAGNOSIS — M129 Arthropathy, unspecified: Secondary | ICD-10-CM | POA: Diagnosis not present

## 2013-02-09 DIAGNOSIS — S0003XA Contusion of scalp, initial encounter: Secondary | ICD-10-CM | POA: Diagnosis not present

## 2013-02-09 DIAGNOSIS — I10 Essential (primary) hypertension: Secondary | ICD-10-CM | POA: Insufficient documentation

## 2013-02-09 DIAGNOSIS — Z9889 Other specified postprocedural states: Secondary | ICD-10-CM | POA: Insufficient documentation

## 2013-02-09 DIAGNOSIS — Z87440 Personal history of urinary (tract) infections: Secondary | ICD-10-CM | POA: Insufficient documentation

## 2013-02-09 DIAGNOSIS — Z950 Presence of cardiac pacemaker: Secondary | ICD-10-CM | POA: Diagnosis not present

## 2013-02-09 DIAGNOSIS — K219 Gastro-esophageal reflux disease without esophagitis: Secondary | ICD-10-CM | POA: Diagnosis not present

## 2013-02-09 DIAGNOSIS — Z7982 Long term (current) use of aspirin: Secondary | ICD-10-CM | POA: Diagnosis not present

## 2013-02-09 DIAGNOSIS — N399 Disorder of urinary system, unspecified: Secondary | ICD-10-CM | POA: Diagnosis not present

## 2013-02-09 DIAGNOSIS — F411 Generalized anxiety disorder: Secondary | ICD-10-CM | POA: Diagnosis not present

## 2013-02-09 DIAGNOSIS — Y939 Activity, unspecified: Secondary | ICD-10-CM | POA: Insufficient documentation

## 2013-02-09 DIAGNOSIS — Z95810 Presence of automatic (implantable) cardiac defibrillator: Secondary | ICD-10-CM | POA: Insufficient documentation

## 2013-02-09 DIAGNOSIS — I2510 Atherosclerotic heart disease of native coronary artery without angina pectoris: Secondary | ICD-10-CM | POA: Insufficient documentation

## 2013-02-09 DIAGNOSIS — I5020 Unspecified systolic (congestive) heart failure: Secondary | ICD-10-CM | POA: Diagnosis not present

## 2013-02-09 DIAGNOSIS — Z79899 Other long term (current) drug therapy: Secondary | ICD-10-CM | POA: Diagnosis not present

## 2013-02-09 DIAGNOSIS — Y92009 Unspecified place in unspecified non-institutional (private) residence as the place of occurrence of the external cause: Secondary | ICD-10-CM | POA: Insufficient documentation

## 2013-02-09 DIAGNOSIS — S1093XA Contusion of unspecified part of neck, initial encounter: Secondary | ICD-10-CM | POA: Diagnosis not present

## 2013-02-09 DIAGNOSIS — R51 Headache: Secondary | ICD-10-CM | POA: Diagnosis not present

## 2013-02-09 DIAGNOSIS — IMO0002 Reserved for concepts with insufficient information to code with codable children: Secondary | ICD-10-CM | POA: Diagnosis not present

## 2013-02-09 DIAGNOSIS — W19XXXA Unspecified fall, initial encounter: Secondary | ICD-10-CM

## 2013-02-09 DIAGNOSIS — E785 Hyperlipidemia, unspecified: Secondary | ICD-10-CM | POA: Insufficient documentation

## 2013-02-09 DIAGNOSIS — W010XXA Fall on same level from slipping, tripping and stumbling without subsequent striking against object, initial encounter: Secondary | ICD-10-CM | POA: Insufficient documentation

## 2013-02-09 NOTE — Discharge Instructions (Signed)
Contusion A contusion is a deep bruise. Contusions happen when an injury causes bleeding under the skin. Signs of bruising include pain, puffiness (swelling), and discolored skin. The contusion may turn blue, purple, or yellow. HOME CARE   Put ice on the injured area.  Put ice in a plastic bag.  Place a towel between your skin and the bag.  Leave the ice on for 15-20 minutes, 03-04 times a day.  Only take medicine as told by your doctor.  Rest the injured area.  If possible, raise (elevate) the injured area to lessen puffiness. GET HELP RIGHT AWAY IF:   You have more bruising or puffiness.  You have pain that is getting worse.  Your puffiness or pain is not helped by medicine. MAKE SURE YOU:   Understand these instructions.  Will watch your condition.  Will get help right away if you are not doing well or get worse. Document Released: 06/13/2007 Document Revised: 03/19/2011 Document Reviewed: 10/30/2010 [facility] Patient Information 2014 [place], [place].  Fall Prevention and Home Safety Falls cause injuries and can affect all age groups. It is possible to prevent falls.  HOW TO PREVENT FALLS  Wear shoes with rubber soles that do not have an opening for your toes.  Keep the inside and outside of your house well lit.  Use night lights throughout your home.  Remove clutter from floors.  Clean up floor spills.  Remove throw rugs or fasten them to the floor with carpet tape.  Do not place electrical cords across pathways.  Put grab bars by your tub, shower, and toilet. Do not use towel bars as grab bars.  Put handrails on both sides of the stairway. Fix loose handrails.  Do not climb on stools or stepladders, if possible.  Do not wax your floors.  Repair uneven or unsafe sidewalks, walkways, or stairs.  Keep items you use a lot within reach.  Be aware of pets.  Keep emergency numbers next to the telephone.  Put smoke detectors in your home and near  bedrooms. Ask your doctor what other things you can do to prevent falls. Document Released: 10/21/2008 Document Revised: 06/26/2011 Document Reviewed: 03/27/2011 [facility] Patient Information 2014 [place], [place].

## 2013-02-09 NOTE — ED Notes (Signed)
She tripped and fell 2 days ago landing face first onto concrete. She had no LOC. Swelling under her eyes. Bruising to her face and forehead. Tightness to the top of her head. Abrasion to her left knee that she states does not bother her. She hit her right thumb but it does not bother her per pt. She was wearing a post op boot on her left foot that caused her to trip.

## 2013-02-09 NOTE — ED Provider Notes (Signed)
CSN: [id number]     Arrival date & time 02/09/13  1210 History   First MD Initiated Contact with Patient 02/09/13 1226     Chief Complaint  Patient presents with  . Fall    HPI She tripped and fell 2 days ago landing face first onto concrete. She had no LOC. Swelling under her eyes. Bruising to her face and forehead. Tightness to the top of her head. Abrasion to her left knee that she states does not bother her. She hit her right thumb but it does not bother her per pt. She was wearing a post op boot on her left foot that caused her to trip.  Patient denies headache, nausea, vomiting.  Patient denies pain.  Patient denies weakness.  Past Medical History  Diagnosis Date  . Chest pain 12/09    recent hospitalization for it. felt to be noncardiac.   [name]. CAD (coronary artery disease)     a. s/p prior anterior wall myocardial infarction and multiple percutaneous coronary intervention.;  b. Aden MV 1/14:  low risk, inf and apical defect likely due to atten artifact, no ischemia, EF 35% (visually normal and est 55-60%)  . Ischemic cardiomyopathy     EF 25-30% improved to 45% with biventricular pacing.   . Systolic heart failure     improved to class I to II   . Automatic implantable cardioverter/defibrillator (AICD) activation     s/p implantable cardioverter-defibrillator bivenrticular pacer. 12/05. St, Jude Medtronictlas. remote-no.  [name]. HTN (hypertension)   . Hyperlipidemia   . GERD (gastroesophageal reflux disease)   . Esophageal stricture     last dialated 2007  . Hiatal hernia   . DD (diverticular disease)     Hx of it - severe. Left colon 2004  . Other urinary problems     urinary incontenance  . UTI (lower urinary tract infection)     recurrent. (Klebsiella pneumoniae). Last Cx 02/03/09. (R-ancef, nitrofurantoin, Augmentin, Zyosyn)  . Arthritis   . PVT (paroxysmal ventricular tachycardia)   . CHF (congestive heart failure)   . Hypothyroidism   . Anxiety     sleep  . Pacemaker   .  Automatic implantable cardioverter-defibrillator in situ   . Heart murmur   . History of recurrent UTIs   . Shortness of breath     with exertion  . Urgency of urination   . Frequent urination at night    Past Surgical History  Procedure Laterality Date  . Angioplasty      stent  . Unspecified area hysterectomy    . Tubal ligation    . Appendectomy    . Cardiac defibrillator placement      [facility] Jude Atlas  . Cataract extraction, bilateral    . Total knee arthroplasty      bilateral  . Tonsillectomy    . Hammertoe reconstruction with weil osteotomy Right 07/17/2012    Procedure: RIGHT SECOND AND THIRD MT WEIL OSTEOTOMIES AND SECOND AND THIRD HAMMERTOE CORRECTIONS;  Surgeon: Toni ArthursJohn Hewitt, MD;  Location: [facility] OR;  Service: Orthopedics;  Laterality: Right;  . Colonoscopy w/ polypectomy    . Cardiac catheterization      stent  . Insert / replace / remove pacemaker    . Hammer toe surgery Left 01/15/2013    Procedure: LEFT SECOND THROUGH FOURTH HAMMERTOE CORRECTION ;  Surgeon: Toni ArthursJohn Hewitt, MD;  Location: [facility] OR;  Service: Orthopedics;  Laterality: Left;  . Weil osteotomy Left 01/15/2013    Procedure:  WEIL  OSTEOTOMY AND DORSAL CAPSULOTOMY;  Surgeon: Toni Arthurs, MD;  Location: [facility] OR;  Service: Orthopedics;  Laterality: Left;   Family History  Problem Relation Age of Onset  . Colon cancer Neg Hx   . Colon polyps Sister   . Heart disease Mother   . Heart disease Father   . Heart disease Brother     multiple brothers  . Heart disease Sister     multiple sisters  . Prostate cancer Brother   . Diabetes Daughter   . Diabetes      aunt  . Breast cancer Daughter   . Alcohol abuse Son    History  Substance Use Topics  . Smoking status: Never Smoker   . Smokeless tobacco: Never Used  . Alcohol Use: No   OB History   Grav Para Term Preterm Abortions TAB SAB Ect Mult Living                 Review of Systems All other systems reviewed and are negative Allergies  Sulfonamide  derivatives  Home Medications   Current Outpatient Rx  Name  Route  Sig  Dispense  Refill  . ALPRAZolam (XANAX) 0.25 MG tablet   Oral   Take 1 tablet (0.25 mg total) by mouth 2 (two) times daily as needed for sleep.   20 tablet   0   . aspirin (ASPIR-81) 81 MG EC tablet   Oral   Take 81 mg by mouth daily.           . furosemide (LASIX) 20 MG tablet   Oral   Take 1 tablet (20 mg total) by mouth daily as needed for fluid or edema.   90 tablet   1   . HYDROcodone-acetaminophen (NORCO) 5-325 MG per tablet   Oral   Take 1 tablet by mouth every 6 (six) hours as needed for moderate pain.   30 tablet   0   . isosorbide mononitrate (IMDUR) 30 MG 24 hr tablet   Oral   Take 1.5 tablets (45 mg total) by mouth every morning.   135 tablet   0     Dispense as written.   [name] levothyroxine (SYNTHROID, LEVOTHROID) 100 MCG tablet   Oral   Take 1 tablet (100 mcg total) by mouth daily.   90 tablet   3   . metoprolol succinate (TOPROL-XL) 50 MG 24 hr tablet   Oral   Take 50 mg by mouth 2 (two) times daily. Take with or immediately following a meal.         . nitroGLYCERIN (NITROSTAT) 0.4 MG SL tablet   Sublingual   Place 1 tablet (0.4 mg total) under the tongue every 5 (five) minutes as needed. At the onset of chest pain, Up to 3 doses   10 tablet   0   . omeprazole (PRILOSEC) 20 MG capsule   Oral   Take 20 mg by mouth daily as needed (for reflux).         [name] OVER THE COUNTER MEDICATION   Oral   Take 1 tablet by mouth daily as needed (for allergiesallertec allergy relief  zyrtec).          . pravastatin (PRAVACHOL) 20 MG tablet   Oral   Take 1 tablet (20 mg total) by mouth at bedtime.   90 tablet   1   . Psyllium (METAFIBER) 48.57 % POWD   Oral   Take 10 mLs by mouth every evening.          [name]  ramipril (ALTACE) 5 MG capsule   Oral   Take 5 mg by mouth daily.          BP 118/60  Pulse 78  Temp(Src) 98.2 F (36.8 C) (Oral)  Resp 20  Wt 127 lb  (57.607 kg)  SpO2 100% Physical Exam  Nursing note and vitals reviewed. Constitutional: She is oriented to person, place, and time. She appears well-developed and well-nourished. No distress.  HENT:  Head: Normocephalic and atraumatic.    Eyes: Pupils are equal, round, and reactive to light.  Neck: Normal range of motion.  Cardiovascular: Normal rate and intact distal pulses.   Pulmonary/Chest: No respiratory distress.  Abdominal: Normal appearance. She exhibits no distension.  Musculoskeletal: Normal range of motion.  Neurological: She is alert and oriented to person, place, and time. She has normal strength. No cranial nerve deficit. GCS eye subscore is 4. GCS verbal subscore is 5. GCS motor subscore is 6.  Skin: Skin is warm and dry. No rash noted.  Psychiatric: She has a normal mood and affect. Her behavior is normal.    ED Course  Procedures (including critical care time) Labs Review Labs Reviewed - No data to display Imaging Review Ct Head Wo Contrast  02/09/2013   CLINICAL DATA:  Head Pain  EXAM: CT HEAD WITHOUT CONTRAST  TECHNIQUE: Contiguous axial images were obtained from the base of the skull through the vertex without intravenous contrast.  COMPARISON:  None.  FINDINGS: There is no evidence of mass effect, midline shift, or extra-axial fluid collections. There is no evidence of a space-occupying lesion or intracranial hemorrhage. There is no evidence of a cortical-based area of acute infarction. There is generalized cerebral atrophy. There is periventricular white matter low attenuation likely secondary to microangiopathy.  The ventricles and sulci are appropriate for the patient's age. The basal cisterns are patent.  Visualized portions of the orbits are unremarkable. The visualized portions of the paranasal sinuses and mastoid air cells are unremarkable. Cerebrovascular atherosclerotic calcifications are noted.  The osseous structures are unremarkable.    IMPRESSION: No acute  intracranial pathology.    Electronically Signed   By: Elige Ko   On: 02/09/2013 13:31      MDM   1. Fall at home   2. Facial contusion        Nelia Shi, MD 02/09/13 [phone]

## 2013-02-09 NOTE — ED Notes (Signed)
Received report from RN Jasmine DecemberSharon

## 2013-02-09 NOTE — ED Notes (Signed)
Pt. Just returned from radiology and is walking with her walker.  Pt. Speech is clear and Pt. Has noted bruising on her face and under each eye.

## 2013-02-10 ENCOUNTER — Telehealth: Payer: Self-pay | Admitting: Neurology

## 2013-02-10 NOTE — Telephone Encounter (Signed)
Do we have any other upcoming botox days?

## 2013-02-10 NOTE — Telephone Encounter (Signed)
I do have authorization for her. You can move if she is okay with this.

## 2013-02-10 NOTE — Telephone Encounter (Signed)
PATIENT CALLED AND WANTS TO RESCH HER BOTOX INJECTION THAT IS ON 02-20-13 FOR ANOTHER DAY IS THERE A DAY THAT WOULD BE OK FOR YOU TO DO THE INJECTION SHE [place] OUT [place] AND WILL BE BACK ON THE 17TH AND HAS A DR APPT ON THE 20TH THANKS

## 2013-02-10 NOTE — Telephone Encounter (Signed)
We could probably do it this Friday instead if authorization is done.  Could do it after the other pts are done, if Jade good with that.

## 2013-02-10 NOTE — Telephone Encounter (Signed)
NEXT BOTOX DAY IS MARCH 13

## 2013-02-11 NOTE — Telephone Encounter (Signed)
PATIENT STATED THAT SHE COULD COME IN SO I GOT HER APPT FOR 02-13-13 AT 1:30. I HAD TO PUT IT IN AS A FOLLOW UP BUT WILL HAVE SHERRI CHANGE IT WHEN SHE GETS IN THE OFFICE. THANKS

## 2013-02-13 ENCOUNTER — Ambulatory Visit (INDEPENDENT_AMBULATORY_CARE_PROVIDER_SITE_OTHER): Payer: Medicare Other | Admitting: Neurology

## 2013-02-13 ENCOUNTER — Encounter: Payer: Self-pay | Admitting: Neurology

## 2013-02-13 VITALS — BP 126/72 | HR 88 | Resp 14 | Ht 60.0 in | Wt 127.2 lb

## 2013-02-13 DIAGNOSIS — G243 Spasmodic torticollis: Secondary | ICD-10-CM

## 2013-02-13 MED ORDER — ONABOTULINUMTOXINA 100 UNITS IJ SOLR
100.0000 [IU] | Freq: Once | INTRAMUSCULAR | Status: AC
  Administered 2013-02-13: 100 [IU] via INTRAMUSCULAR

## 2013-02-13 NOTE — Procedures (Signed)
[facility]   Procedure Note Botox  Attending: Dr. Lurena Joinerebecca Tat  Preoperative Diagnosis(es): Cervical Dystonia  Result History  Onset of effect: 10 d Duration of Benefit: still working, pleased with medication.   Adverse Effects: denies dysphagia last time  EXAM:  Pt with some head titubation with head turn to the R but minor.  Facial ecchymosis in various stages of healing bilaterally due to fall recently.  MS normal.  Consent obtained from: The patient Benefits discussed included, but were not limited to decreased muscle tightness, increased joint range of motion, and decreased pain.  Risk discussed included, but were not limited pain and discomfort, bleeding, bruising, excessive weakness, venous thrombosis, muscle atrophy and dysphagia.  A copy of the patient medication guide was given to the patient which explains the blackbox warning.  Patients identity and treatment sites confirmed yes.   Details of Procedure: Skin was cleaned with alcohol.  A 30 gauge, 1/2 inch needle was introduced to the target muscles. Prior to injection, the needle plunger was aspirated to make sure the needle was not within a blood vessel.  There was no blood retrieved on aspiration.    Following is a summary of the muscles injected  And the amount of Botulinum toxin used:   Dilution 0.9% preservative free saline mixed with 100 u Botox type A to make 10 U per 0.1cc  Injections  Location Left  Right Units Number of sites        Sternocleidomastoid 30+30  60   Splenius Capitus, posterior approach      Splenius Capitus, lateral approach  40    Levator Scapulae      Trapezius            TOTAL UNITS:   100    Agent: Botulinum Type A ( Onobotulinum Toxin type A ).  1 vials of Botox were used, each containing 100 units and freshly diluted with 1 mL of sterile, non-preserved saline   Total injected (Units): 100  Total wasted (Units): none wasted NOTE:  Decision was made today to d/c levator  scapulae due to dysphagia.  Pt tolerated procedure well without complications.   Reinjection is anticipated in 3 months.  Reassessment will be in 4 weeks.

## 2013-02-13 NOTE — Progress Notes (Signed)
See botox procedural notes

## 2013-02-13 NOTE — Patient Instructions (Signed)
Call with any problems swallowing. Follow up in 3 months.

## 2013-02-20 ENCOUNTER — Ambulatory Visit: Payer: Medicare Other | Admitting: Neurology

## 2013-02-22 ENCOUNTER — Other Ambulatory Visit: Payer: Self-pay | Admitting: Physician Assistant

## 2013-02-27 DIAGNOSIS — M2040 Other hammer toe(s) (acquired), unspecified foot: Secondary | ICD-10-CM | POA: Diagnosis not present

## 2013-02-27 DIAGNOSIS — Z4789 Encounter for other orthopedic aftercare: Secondary | ICD-10-CM | POA: Diagnosis not present

## 2013-03-02 ENCOUNTER — Encounter: Payer: Self-pay | Admitting: Internal Medicine

## 2013-03-02 ENCOUNTER — Ambulatory Visit (INDEPENDENT_AMBULATORY_CARE_PROVIDER_SITE_OTHER): Payer: Medicare Other | Admitting: Internal Medicine

## 2013-03-02 VITALS — BP 126/75 | HR 88 | Ht 59.0 in | Wt 122.0 lb

## 2013-03-02 DIAGNOSIS — Z95810 Presence of automatic (implantable) cardiac defibrillator: Secondary | ICD-10-CM | POA: Diagnosis not present

## 2013-03-02 DIAGNOSIS — I4729 Other ventricular tachycardia: Secondary | ICD-10-CM

## 2013-03-02 DIAGNOSIS — T82198A Other mechanical complication of other cardiac electronic device, initial encounter: Secondary | ICD-10-CM

## 2013-03-02 DIAGNOSIS — I5022 Chronic systolic (congestive) heart failure: Secondary | ICD-10-CM

## 2013-03-02 DIAGNOSIS — I2589 Other forms of chronic ischemic heart disease: Secondary | ICD-10-CM | POA: Diagnosis not present

## 2013-03-02 DIAGNOSIS — I472 Ventricular tachycardia: Secondary | ICD-10-CM

## 2013-03-02 DIAGNOSIS — I255 Ischemic cardiomyopathy: Secondary | ICD-10-CM

## 2013-03-02 LAB — MDC_IDC_ENUM_SESS_TYPE_INCLINIC
Brady Statistic RV Percent Paced: 98 %
HighPow Impedance: 38.5073
Implantable Pulse Generator Serial Number: 815822
Lead Channel Impedance Value: 362.5 Ohm
Lead Channel Impedance Value: 425 Ohm
Lead Channel Pacing Threshold Amplitude: 1.5 V
Lead Channel Pacing Threshold Amplitude: 1.75 V
Lead Channel Pacing Threshold Pulse Width: 0.5 ms
Lead Channel Pacing Threshold Pulse Width: 1 ms
Lead Channel Pacing Threshold Pulse Width: 1 ms
Lead Channel Sensing Intrinsic Amplitude: 4 mV
Lead Channel Sensing Intrinsic Amplitude: 6.4 mV
Lead Channel Setting Pacing Amplitude: 2 V
Lead Channel Setting Pacing Pulse Width: 0.5 ms
Lead Channel Setting Pacing Pulse Width: 0.8 ms
Lead Channel Setting Sensing Sensitivity: 0.5 mV
MDC IDC MSMT BATTERY REMAINING LONGEVITY: 26.4 mo
MDC IDC MSMT LEADCHNL LV PACING THRESHOLD AMPLITUDE: 1.5 V
MDC IDC MSMT LEADCHNL RA PACING THRESHOLD AMPLITUDE: 1 V
MDC IDC MSMT LEADCHNL RV IMPEDANCE VALUE: 600 Ohm
MDC IDC MSMT LEADCHNL RV PACING THRESHOLD PULSEWIDTH: 0.5 ms
MDC IDC SESS DTM: 20150223151417
MDC IDC SET LEADCHNL LV PACING AMPLITUDE: 2.75 V
MDC IDC SET LEADCHNL RV PACING AMPLITUDE: 2.75 V
MDC IDC SET ZONE DETECTION INTERVAL: 270 ms
MDC IDC STAT BRADY RA PERCENT PACED: 0.5 %
Zone Setting Detection Interval: 300 ms
Zone Setting Detection Interval: 365 ms

## 2013-03-02 NOTE — Assessment & Plan Note (Signed)
No intercurrent Ventricular tachycardia  

## 2013-03-02 NOTE — Assessment & Plan Note (Signed)
euvolemic  Continue current meds 

## 2013-03-02 NOTE — Assessment & Plan Note (Signed)
The patient's device was interrogated.  The information was reviewed. No changes were made in the programming.    

## 2013-03-02 NOTE — Progress Notes (Signed)
Patient Care Team: Sheliah HatchKatherine E Tabori, MD as PCP - General (Family Medicine)   HPI  [name] is a 80 y.o. female Seen in followup for coronary artery disease with multiple prior PCI and congestive heart failure. She has an ischemic cardiomyopathy and is status post CRT-D implantation with interval improvement. Generator replacement in November 2011.  She continues to travel. She has been back to [place] and has also been to [place] as [place]. She anticipates returning to [place] in just a few months with her granddaughter  She tolerated her hammertoe surgery very well although she did have a fall.   She has a RIATA 1500 lead in place  [facility]: RCA stent site patent, PL branch of the RCA 50%, dCFX 80% treated with Taxus DES  Nuclear study a 12/10: No scar or ischemia, EF 54%.  Repeat nuclear study 1/14 because of chest pain demonstrated no significant ischemia. Ejection fraction is measured at 35% although visualization thought to be about 55-60% with normal wall motion      Past Medical History  Diagnosis Date  . Chest pain 12/09    recent hospitalization for it. felt to be noncardiac.   [name]. CAD (coronary artery disease)     a. s/p prior anterior wall myocardial infarction and multiple percutaneous coronary intervention.;  b. Aden MV 1/14:  low risk, inf and apical defect likely due to atten artifact, no ischemia, EF 35% (visually normal and est 55-60%)  . Ischemic cardiomyopathy     EF 25-30% improved to 45% with biventricular pacing.   . Systolic heart failure     improved to class I to II   . Automatic implantable cardioverter/defibrillator (AICD) activation     s/p implantable cardioverter-defibrillator bivenrticular pacer. 12/05. St[employer]. remote-no.  [name]. HTN (hypertension)   . Hyperlipidemia   . GERD (gastroesophageal reflux disease)   . Esophageal stricture     last dialated 2007  . Hiatal hernia   . DD  (diverticular disease)     Hx of it - severe. Left colon 2004  . Other urinary problems     urinary incontenance  . UTI (lower urinary tract infection)     recurrent. (Klebsiella pneumoniae). Last Cx 02/03/09. (R-ancef, nitrofurantoin, Augmentin, Zyosyn)  . Arthritis   . PVT (paroxysmal ventricular tachycardia)   . CHF (congestive heart failure)   . Hypothyroidism   . Anxiety     sleep  . Pacemaker   . Automatic implantable cardioverter-defibrillator in situ   . Heart murmur   . History of recurrent UTIs   . Shortness of breath     with exertion  . Urgency of urination   . Frequent urination at night     Past Surgical History  Procedure Laterality Date  . Angioplasty      stent  . Unspecified area hysterectomy    . Tubal ligation    . Appendectomy    . Cardiac defibrillator placement      [facility]  . Cataract extraction, bilateral    . Total knee arthroplasty      bilateral  . Tonsillectomy    . Hammertoe reconstruction with weil osteotomy Right 07/17/2012    Procedure: RIGHT SECOND AND THIRD MT WEIL OSTEOTOMIES AND SECOND AND THIRD HAMMERTOE CORRECTIONS;  Surgeon: Toni ArthursJohn Hewitt, MD;  Location: [facility] OR;  Service: Orthopedics;  Laterality: Right;  . Colonoscopy w/ polypectomy    . Cardiac  catheterization      stent  . Insert / replace / remove pacemaker    . Hammer toe surgery Left 01/15/2013    Procedure: LEFT SECOND THROUGH FOURTH HAMMERTOE CORRECTION ;  Surgeon: Toni Arthurs, MD;  Location: [facility] OR;  Service: Orthopedics;  Laterality: Left;  . Weil osteotomy Left 01/15/2013    Procedure:  WEIL OSTEOTOMY AND DORSAL CAPSULOTOMY;  Surgeon: Toni Arthurs, MD;  Location: [facility] OR;  Service: Orthopedics;  Laterality: Left;    Current Outpatient Prescriptions  Medication Sig Dispense Refill  . ALPRAZolam (XANAX) 0.25 MG tablet Take 1 tablet (0.25 mg total) by mouth 2 (two) times daily as needed for sleep.  20 tablet  0  . aspirin (ASPIR-81) 81 MG EC tablet Take 81 mg by mouth daily.         . furosemide (LASIX) 20 MG tablet Take 1 tablet (20 mg total) by mouth daily as needed for fluid or edema.  90 tablet  1  . HYDROcodone-acetaminophen (NORCO) 5-325 MG per tablet Take 1 tablet by mouth every 6 (six) hours as needed for moderate pain.  30 tablet  0  . isosorbide mononitrate (IMDUR) 30 MG 24 hr tablet Take 1.5 tablets (45 mg total) by mouth every morning.  135 tablet  0  . isosorbide mononitrate (IMDUR) 30 MG 24 hr tablet TAKE 1 AND 1/2 TABLETS EVERY MORNING  45 tablet  8  . lansoprazole (PREVACID) 30 MG capsule Take 30 mg by mouth as needed.       [name] levothyroxine (SYNTHROID, LEVOTHROID) 100 MCG tablet Take 1 tablet (100 mcg total) by mouth daily.  90 tablet  3  . metoprolol succinate (TOPROL-XL) 50 MG 24 hr tablet Take 50 mg by mouth 2 (two) times daily. Take with or immediately following a meal.      . nitroGLYCERIN (NITROSTAT) 0.4 MG SL tablet Place 1 tablet (0.4 mg total) under the tongue every 5 (five) minutes as needed. At the onset of chest pain, Up to 3 doses  10 tablet  0  . omeprazole (PRILOSEC) 20 MG capsule Take 20 mg by mouth daily as needed (for reflux).      [name] OVER THE COUNTER MEDICATION Take 1 tablet by mouth daily as needed (for allergiesallertec allergy relief  zyrtec).       . pravastatin (PRAVACHOL) 20 MG tablet Take 1 tablet (20 mg total) by mouth at bedtime.  90 tablet  1  . Psyllium (METAFIBER) 48.57 % POWD Take 10 mLs by mouth every evening.       . ramipril (ALTACE) 5 MG capsule Take 5 mg by mouth daily.       No current facility-administered medications for this visit.    Allergies  Allergen Reactions  . Sulfonamide Derivatives     REACTION: rash and itching    Review of Systems negative except from HPI and PMH  Physical Exam BP 126/75  Pulse 88  Ht 4\' 11"  (1.499 m)  Wt 122 lb (55.339 kg)  BMI 24.63 kg/m2 Well developed and well nourished in no acute distress HENT normal E scleral and icterus clear Neck Supple Marked  kyphosis Clear to ausculation status post Regular rate and rhythm, no murmurs gallops or rub Soft with active bowel sounds No clubbing cyanosis trace Edema Alert and oriented, grossly normal motor and sensory function Skin Warm and Dry  ECG demonstrates P. synchronous biventricular pacing    Assessment and  Plan

## 2013-03-02 NOTE — Assessment & Plan Note (Signed)
Electrically stable

## 2013-03-02 NOTE — Assessment & Plan Note (Signed)
conitune current meds

## 2013-03-02 NOTE — Patient Instructions (Signed)
Your physician recommends that you continue on your current medications as directed. Please refer to the Current Medication list given to you today.  Remote monitoring is used to monitor your Pacemaker of ICD from home. This monitoring reduces the number of office visits required to check your device to one time per year. It allows us to keep an eye on the functioning of your device to ensure it is working properly. You are scheduled for a device check from home on 06/03/2013. You may send your transmission at any time that day. If you have a wireless device, the transmission will be sent automatically. After your physician reviews your transmission, you will receive a postcard with your next transmission date.  Your physician wants you to follow-up in: 1 year with Dr. Klein.  You will receive a reminder letter in the mail two months in advance. If you don't receive a letter, please call our office to schedule the follow-up appointment.  

## 2013-03-06 ENCOUNTER — Ambulatory Visit (INDEPENDENT_AMBULATORY_CARE_PROVIDER_SITE_OTHER): Payer: Medicare Other | Admitting: Family Medicine

## 2013-03-06 ENCOUNTER — Ambulatory Visit (HOSPITAL_BASED_OUTPATIENT_CLINIC_OR_DEPARTMENT_OTHER)
Admission: RE | Admit: 2013-03-06 | Discharge: 2013-03-06 | Disposition: A | Discharge status: Home / Self Care | Payer: Medicare Other | Source: Ambulatory Visit | Attending: Family Medicine | Admitting: Family Medicine

## 2013-03-06 ENCOUNTER — Ambulatory Visit: Payer: Medicare Other | Admitting: Family Medicine

## 2013-03-06 ENCOUNTER — Encounter: Payer: Self-pay | Admitting: Family Medicine

## 2013-03-06 VITALS — BP 120/76 | HR 84 | Temp 98.3°F | Resp 16 | Wt 123.5 lb

## 2013-03-06 DIAGNOSIS — R05 Cough: Secondary | ICD-10-CM | POA: Insufficient documentation

## 2013-03-06 DIAGNOSIS — R0989 Other specified symptoms and signs involving the circulatory and respiratory systems: Secondary | ICD-10-CM | POA: Insufficient documentation

## 2013-03-06 DIAGNOSIS — F0781 Postconcussional syndrome: Secondary | ICD-10-CM

## 2013-03-06 DIAGNOSIS — R059 Cough, unspecified: Secondary | ICD-10-CM

## 2013-03-06 DIAGNOSIS — I2589 Other forms of chronic ischemic heart disease: Secondary | ICD-10-CM | POA: Diagnosis not present

## 2013-03-06 MED ORDER — AMOXICILLIN 875 MG PO TABS: 875.0000 mg | ORAL_TABLET | Freq: Two times a day (BID) | ORAL | Status: DC

## 2013-03-06 MED ORDER — ALBUTEROL SULFATE (2.5 MG/3ML) 0.083% IN NEBU
2.5000 mg | INHALATION_SOLUTION | Freq: Once | RESPIRATORY_TRACT | Status: AC
  Administered 2013-03-06: 2.5 mg via RESPIRATORY_TRACT

## 2013-03-06 MED ORDER — GUAIFENESIN-CODEINE 100-10 MG/5ML PO SYRP: 10.0000 mL | ORAL_SOLUTION | Freq: Three times a day (TID) | ORAL | Status: DC | PRN

## 2013-03-06 NOTE — Progress Notes (Signed)
   Subjective:    Patient ID: [name], female    DOB: [birth year], 80 y.o.   MRN: [id number]  Cough   Fall on 2/2- pt fell face first onto the concrete.  Had normal head CT but had large knot on forehead and subsequent facial hematomas.  No dizziness.  Some difficulty w/ spacial orientation.  Mild HAs.  URI- pt reports constant cough, 'day and night'.  sxs started 3 weeks ago.  + chills, nasal congestion, hoarseness, sinus pressure w/ 'bright yellow' nasal drainage.  + fatigue.  Pt doesn't feel that her cough is related to the botox- typical side effects for her are hoarseness and difficulty swallowing.  + chills and subjective fever.  + wheezing.   Review of Systems  Respiratory: Positive for cough.    For ROS see HPI     Objective:   Physical Exam  Vitals reviewed. Constitutional: She appears well-developed and well-nourished. No distress.  HENT:  Head: Normocephalic and atraumatic.  TMs normal bilaterally Mild nasal congestion Throat w/out erythema, edema, or exudate  Eyes: Conjunctivae and EOM are normal. Pupils are equal, round, and reactive to light.  Neck: Normal range of motion. Neck supple.  Cardiovascular: Normal rate, regular rhythm, normal heart sounds and intact distal pulses.   No murmur heard. Pulmonary/Chest: Effort normal. No respiratory distress. She has wheezes (diffuse expiratory wheezes).  + hacking cough Coarse BS, R>L, ? crackles  Lymphadenopathy:    She has no cervical adenopathy.          Assessment & Plan:

## 2013-03-06 NOTE — Patient Instructions (Signed)
Follow up in 2 weeks if not better [facility] to [facility] and get your [facility] Start the Amoxicillin twice daily Use the cough syrup as needed- may cause drowsiness We'll call you with your xray results Call with any questions or concerns Hang in there!!!

## 2013-03-06 NOTE — Progress Notes (Signed)
Pre visit review using our clinic review tool, if applicable. No additional management support is needed unless otherwise documented below in the visit note. 

## 2013-03-08 DIAGNOSIS — F0781 Postconcussional syndrome: Secondary | ICD-10-CM | POA: Insufficient documentation

## 2013-03-08 NOTE — Assessment & Plan Note (Signed)
New.  Pt's sxs are ongoing and severe.  Get CXR to r/o PNA.  Due to duration of illness and severity, will start abx along w/ codeine cough syrup.  Reviewed supportive care and red flags that should prompt return.  Pt expressed understanding and is in agreement w/ plan.

## 2013-03-08 NOTE — Assessment & Plan Note (Signed)
New.  Pt had fall w/ head injury earlier this month.  Continues to have low grade headaches and difficuty w/ spatial orientation.  Pt had normal head CT and no focal deficits.  Will follow.  Reviewed warning signs w/ daughter.

## 2013-03-09 ENCOUNTER — Ambulatory Visit (INDEPENDENT_AMBULATORY_CARE_PROVIDER_SITE_OTHER): Payer: Medicare Other | Admitting: Family Medicine

## 2013-03-09 ENCOUNTER — Encounter: Payer: Self-pay | Admitting: Family Medicine

## 2013-03-09 DIAGNOSIS — R0602 Shortness of breath: Secondary | ICD-10-CM

## 2013-03-09 DIAGNOSIS — R059 Cough, unspecified: Secondary | ICD-10-CM

## 2013-03-09 DIAGNOSIS — I2589 Other forms of chronic ischemic heart disease: Secondary | ICD-10-CM

## 2013-03-09 DIAGNOSIS — R05 Cough: Secondary | ICD-10-CM

## 2013-03-09 MED ORDER — ALBUTEROL SULFATE (2.5 MG/3ML) 0.083% IN NEBU
2.5000 mg | INHALATION_SOLUTION | Freq: Once | RESPIRATORY_TRACT | Status: AC
  Administered 2013-03-09: 2.5 mg via RESPIRATORY_TRACT

## 2013-03-09 NOTE — Assessment & Plan Note (Signed)
Pt instructed on HFA use.  Neb tx given in office.

## 2013-03-09 NOTE — Patient Instructions (Signed)
Patient also given Pro Air inhaler per MD instructions. Patient instructed how to use inhaler and advised to rinse mouth with water and spit after each use. Patient able to provide return demonstration.

## 2013-03-09 NOTE — Progress Notes (Signed)
   Subjective:    Patient ID: [name], female    DOB: October 17, 1921, 80 y.o.   MRN: [id number]  HPI Pt here today for nurse visit to learn how to use inhaler and get neb tx.   Review of Systems + cough, SOB    Objective:   Physical Exam  Constitutional: She appears well-developed and well-nourished. No distress.  Pulmonary/Chest:  cough  Skin: Skin is warm and dry.          Assessment & Plan:

## 2013-03-16 ENCOUNTER — Ambulatory Visit (INDEPENDENT_AMBULATORY_CARE_PROVIDER_SITE_OTHER): Payer: Medicare Other | Admitting: Physician Assistant

## 2013-03-16 ENCOUNTER — Ambulatory Visit: Payer: Medicare Other | Admitting: Family Medicine

## 2013-03-16 ENCOUNTER — Encounter: Payer: Self-pay | Admitting: Physician Assistant

## 2013-03-16 VITALS — BP 154/87 | HR 82 | Temp 98.0°F | Resp 14 | Ht 59.0 in | Wt 124.4 lb

## 2013-03-16 DIAGNOSIS — J209 Acute bronchitis, unspecified: Secondary | ICD-10-CM

## 2013-03-16 MED ORDER — AZITHROMYCIN 250 MG PO TABS: ORAL_TABLET | ORAL | Status: DC

## 2013-03-16 MED ORDER — ALPRAZOLAM 0.25 MG PO TABS: 0.2500 mg | ORAL_TABLET | Freq: Two times a day (BID) | ORAL | Status: DC | PRN

## 2013-03-16 NOTE — Patient Instructions (Signed)
Please take antibiotic as prescribed.  Increase fluid intake.  Rest.  Use NetiPot.  Take Plain Mucinex.  Continue Albuterol as directed.  Place a humidifier in the bedroom.  Please remember that cough is the last symptoms to disappear after a respiratory infection.  May persist for a couple of weeks, but should be nonproductive.  If symptoms are not improving despite medications, please follow-up with Dr. Beverely Lowabori.

## 2013-03-16 NOTE — Progress Notes (Signed)
Pre visit review using our clinic review tool, if applicable. No additional management support is needed unless otherwise documented below in the visit note/SLS  

## 2013-03-19 DIAGNOSIS — J209 Acute bronchitis, unspecified: Secondary | ICD-10-CM | POA: Insufficient documentation

## 2013-03-19 NOTE — Assessment & Plan Note (Signed)
Rx Azithromycin. Do not feel that repeat CXR is indicated at present time. Vital signs are good.  No concerning PE findings.  Increase fluid intake.  Rest.  Use NetiPot.  Take Plain Mucinex.  Continue Albuterol as directed.  Place a humidifier in the bedroom.  Patient instructed to remember that cough is the last symptoms to disappear after a respiratory infection.  May persist for a couple of weeks, but should be nonproductive.  If symptoms are not improving despite medications, patient to follow-up with Dr. Beverely Lowabori.

## 2013-03-19 NOTE — Progress Notes (Signed)
Patient presents to clinic today c/o continued cough of thick white-green sputum, chest congestion and rhinorrhea.  Patient was seen recently by her PCP and diagnosed with cough.  CXr was obtained and negative for pneumonia. Patient was given Rx for Amoxicllin.  Symptoms improved but after completion of medication, symptoms have recurred and seem to be worsening over the past couple of days.  Patient denies fever, chills, aches.  Endorses mild fatigue.  Denies SOB or wheezing.  Denies recent travel. O2 sats are 98 % on room air during visit.  Past Medical History  Diagnosis Date  . Chest pain 12/09    recent hospitalization for it. felt to be noncardiac.   [name] CAD (coronary artery disease)     a. s/p prior anterior wall myocardial infarction and multiple percutaneous coronary intervention.;  b. Aden MV 1/14:  low risk, inf and apical defect likely due to atten artifact, no ischemia, EF 35% (visually normal and est 55-60%)  . Ischemic cardiomyopathy     EF 25-30% improved to 45% with biventricular pacing.   . Systolic heart failure     improved to class I to II   . Automatic implantable cardioverter/defibrillator (AICD) activation     s/p implantable cardioverter-defibrillator bivenrticular pacer. 12/05. St, Jude [employer]. remote-no.  [name] HTN (hypertension)   . Hyperlipidemia   . GERD (gastroesophageal reflux disease)   . Esophageal stricture     last dialated 2007  . Hiatal hernia   . DD (diverticular disease)     Hx of it - severe. Left colon 2004  . Other urinary problems     urinary incontenance  . UTI (lower urinary tract infection)     recurrent. (Klebsiella pneumoniae). Last Cx 02/03/09. (R-ancef, nitrofurantoin, Augmentin, Zyosyn)  . Arthritis   . PVT (paroxysmal ventricular tachycardia)   . CHF (congestive heart failure)   . Hypothyroidism   . Anxiety     sleep  . Pacemaker   . Automatic implantable cardioverter-defibrillator in situ   . Heart murmur   . History of recurrent UTIs    . Shortness of breath     with exertion  . Urgency of urination   . Frequent urination at night     Current Outpatient Prescriptions on File Prior to Visit  Medication Sig Dispense Refill  . aspirin (ASPIR-81) 81 MG EC tablet Take 81 mg by mouth daily.        . furosemide (LASIX) 20 MG tablet Take 1 tablet (20 mg total) by mouth daily as needed for fluid or edema.  90 tablet  1  . isosorbide mononitrate (IMDUR) 30 MG 24 hr tablet TAKE 1 AND 1/2 TABLETS EVERY MORNING  45 tablet  8  . levothyroxine (SYNTHROID, LEVOTHROID) 100 MCG tablet Take 1 tablet (100 mcg total) by mouth daily.  90 tablet  3  . metoprolol succinate (TOPROL-XL) 50 MG 24 hr tablet Take 50 mg by mouth 2 (two) times daily. Take with or immediately following a meal.      . nitroGLYCERIN (NITROSTAT) 0.4 MG SL tablet Place 1 tablet (0.4 mg total) under the tongue every 5 (five) minutes as needed. At the onset of chest pain, Up to 3 doses  10 tablet  0  . omeprazole (PRILOSEC) 20 MG capsule Take 20 mg by mouth daily as needed (for reflux).      [name] OVER THE COUNTER MEDICATION Take 1 tablet by mouth daily as needed (for allergiesallertec allergy relief  zyrtec).       [name]  pravastatin (PRAVACHOL) 20 MG tablet Take 1 tablet (20 mg total) by mouth at bedtime.  90 tablet  1  . Psyllium (METAFIBER) 48.57 % POWD Take 10 mLs by mouth every evening.       . ramipril (ALTACE) 5 MG capsule Take 5 mg by mouth daily.       No current facility-administered medications on file prior to visit.    Allergies  Allergen Reactions  . Sulfonamide Derivatives     REACTION: rash and itching    Family History  Problem Relation Age of Onset  . Colon cancer Neg Hx   . Colon polyps Sister   . Heart disease Mother   . Heart disease Father   . Heart disease Brother     multiple brothers  . Heart disease Sister     multiple sisters  . Prostate cancer Brother   . Diabetes Daughter   . Diabetes      aunt  . Breast cancer Daughter   .  Alcohol abuse Son     History   Social History  . Marital Status: Widowed    Spouse Name: N/A    Number of Children: N/A  . Years of Education: N/A   Social History Main Topics  . Smoking status: Never Smoker   . Smokeless tobacco: Never Used  . Alcohol Use: No  . Drug Use: No  . Sexual Activity: None   Other Topics Concern  . None   Social History Narrative   Widowed, husband died of brain tumor. Son lives in [place], [place]- was in a MVA for DWI. He has had a long hx of alcohol problems, she has helped him out financially in the past but is ready to be much stricter. Pt. Has a daughter who live in [place] with Breast Ca. She also lost a son to a brain tumor.    Review of Systems - See HPI.  All other ROS are negative.  BP 154/87  Pulse 82  Temp(Src) 98 F (36.7 C) (Oral)  Resp 14  Ht _0  (1.499 m)  Wt 124 lb 6 oz (56.416 kg)  BMI 25.11 kg/m2  SpO2 98%  Physical Exam  Constitutional: She is oriented to person, place, and time and well-developed, well-nourished, and in no distress.  HENT:  Head: Normocephalic and atraumatic.  Right Ear: External ear normal.  Left Ear: External ear normal.  Nose: Nose normal.  Mouth/Throat: Oropharynx is clear and moist. No oropharyngeal exudate.  TM within normal limits bilaterally.  No TTP of sinuses noted on examination.  Eyes: Conjunctivae are normal. Pupils are equal, round, and reactive to light.  Neck: Neck supple.  Cardiovascular: Normal rate, regular rhythm, normal heart sounds and intact distal pulses.   Pulmonary/Chest: Effort normal and breath sounds normal. No respiratory distress. She has no wheezes. She has no rales. She exhibits no tenderness.  Lymphadenopathy:    She has no cervical adenopathy.  Neurological: She is alert and oriented to person, place, and time.  Skin: Skin is warm and dry. No rash noted.  Psychiatric: Affect normal.    Recent Results (from the past 2160 hour(s))  BASIC METABOLIC PANEL     Status:  Abnormal   Collection Time    01/12/13 10:58 AM      Result Value Ref Range   Sodium 141  137 - 147 mEq/L   Potassium 5.0  3.7 - 5.3 mEq/L   Chloride 105  96 - 112 mEq/L   CO2 26  19 - 32 mEq/L   Glucose, Bld 107 (*) 70 - 99 mg/dL   BUN 14  6 - 23 mg/dL   Creatinine, Ser 0.59  0.50 - 1.10 mg/dL   Calcium 10.4  8.4 - 10.5 mg/dL   GFR calc non Af Amer 79 (*) >90 mL/min   GFR calc Af Amer >90  >90 mL/min   Comment: (NOTE)     The eGFR has been calculated using the CKD EPI equation.     This calculation has not been validated in all clinical situations.     eGFR's persistently <90 mL/min signify possible Chronic Kidney     Disease.  CBC     Status: None   Collection Time    01/12/13 10:58 AM      Result Value Ref Range   WBC 6.5  4.0 - 10.5 K/uL   RBC 4.30  3.87 - 5.11 MIL/uL   Hemoglobin 13.1  12.0 - 15.0 g/dL   HCT 38.8  36.0 - 46.0 %   MCV 90.2  78.0 - 100.0 fL   MCH 30.5  26.0 - 34.0 pg   MCHC 33.8  30.0 - 36.0 g/dL   RDW 13.7  11.5 - 15.5 %   Platelets 177  150 - 400 K/uL  MDC_IDC_ENUM_SESS_TYPE_INCLINIC     Status: None   Collection Time    03/02/13  3:45 PM      Result Value Ref Range   Date Time Interrogation Session [id number]     Pulse Generator Manufacturer [employer]. [employer]     Pulse Gen Model 3231-40 Unify     Pulse Gen Serial Number [id number]     RV Sense Sensitivity 0.5     RA Pace Amplitude 2.0     RV Pace PulseWidth 0.5     RV Pace Amplitude 2.75     LV Pace PulseWidth 0.8     LV Pace Amplitude 2.75     Zone Setting Type Category VF     Zone Detect Interval 270     Zone Setting Type Category VT     Zone Detect Interval 300     Zone Setting Type Category VENTRICULAR_TACHYCARDIA_1     Zone Detect Interval 365     RA Impedance 425.0     RA Amplitude 4.0     RA Pacing Amplitude 1.0     RA Pacing PulseWidth 0.5     RV IMPEDANCE 600.0     RV Amplitude 6.4     RV Pacing Amplitude 1.75     RV Pacing PulseWidth 0.5     HighPow Impedance  [id number]     LV Impedance 362.5     LV Pacing Amplitude 1.5     LV PACING PULSEWIDTH 1.0     LV Pacing Amplitude 1.5     LV PACING PULSEWIDTH 1.0     Battery Longevity 26.4     Brady RA Perc Paced 0.5     Brady RV Perc Paced 98.0     Eval Rhythm SB     Miscellaneous Comment       Value: CRT-D device check in office. Thresholds and sensing consistent with previous device measurements. Lead impedance trends stable over time. 65 mode switch episodes recorded--longest was 35 minutes 30 seconds. No ventricular arrhythmia episodes recorded.      Patient bi-ventricularly pacing 98% of the time. Device programmed with appropriate safety margins.--changed LV pulse configuration from Bipolar to LVtip to RVcoil. CorVue increase 1-18 x 10  days, 11-25 x 7 days, 10-30 x 9 days, 10-2 x 8 days.      Audible/vibratory alerts demonstrated for patient. Estimated longevity 2.2 to 2.9 years.  Patient enrolled in remote follow up. Merlin 06-03-13 and ROV in 12 mths w/SK.    Assessment/Plan: Acute bronchitis Rx Azithromycin. Do not feel that repeat CXR is indicated at present time. Vital signs are good.  No concerning PE findings.  Increase fluid intake.  Rest.  Use NetiPot.  Take Plain Mucinex.  Continue Albuterol as directed.  Place a humidifier in the bedroom.  Patient instructed to remember that cough is the last symptoms to disappear after a respiratory infection.  May persist for a couple of weeks, but should be nonproductive.  If symptoms are not improving despite medications, patient to follow-up with Dr. Birdie Riddle.

## 2013-03-20 ENCOUNTER — Telehealth: Payer: Self-pay | Admitting: General Practice

## 2013-03-20 ENCOUNTER — Encounter: Payer: Self-pay | Admitting: Family Medicine

## 2013-03-20 ENCOUNTER — Ambulatory Visit (INDEPENDENT_AMBULATORY_CARE_PROVIDER_SITE_OTHER): Payer: Medicare Other | Admitting: Family Medicine

## 2013-03-20 VITALS — BP 128/78 | HR 107 | Temp 98.0°F | Resp 16 | Wt 122.2 lb

## 2013-03-20 DIAGNOSIS — F411 Generalized anxiety disorder: Secondary | ICD-10-CM

## 2013-03-20 DIAGNOSIS — I2589 Other forms of chronic ischemic heart disease: Secondary | ICD-10-CM

## 2013-03-20 DIAGNOSIS — F419 Anxiety disorder, unspecified: Secondary | ICD-10-CM

## 2013-03-20 DIAGNOSIS — J209 Acute bronchitis, unspecified: Secondary | ICD-10-CM

## 2013-03-20 MED ORDER — ALPRAZOLAM 0.25 MG PO TABS: 0.2500 mg | ORAL_TABLET | Freq: Two times a day (BID) | ORAL | Status: DC | PRN

## 2013-03-20 NOTE — Progress Notes (Signed)
Pre visit review using our clinic review tool, if applicable. No additional management support is needed unless otherwise documented below in the visit note. 

## 2013-03-20 NOTE — Telephone Encounter (Signed)
Pt was seen in our office today for a follow up from 03/16/13. Pt granddaughter states that when she was given a refill of alprazolam on 03/16/13. Advised that she felt very disrespected on that day because as she was given the refill by the nurse working with Selena Battenody she was advised that "this was the last refill she would receive" advised that they never said anything to cody about this because they were in shock. Both were advised that Beverely Lowabori never stated that and that we are sorry they felt disrespected.   Pt and granddaughter both spoke harshly and rudely to [place] when they thought it was her doing. I advised them that I would send an phone note to our manager for further investigation. They would like a call at your convenience to discuss.

## 2013-03-20 NOTE — Patient Instructions (Signed)
Follow up as needed Your breathing is much better Call with any questions or concerns Hang in there!!

## 2013-03-20 NOTE — Progress Notes (Signed)
   Subjective:    Patient ID: [name], female    DOB: September 11, 1921, 80 y.o.   MRN: [id number]  HPI Bronchitis- pt saw Selena BattenCody on 3/9 (after seeing Dr Laury AxonLowne on 3/4 and me on 2/27).  Pt reports feeling better than she did previously.  Still having hoarseness.  Cough has improved- no longer deep, much more infrequent.  No fevers.  SOB has improved.  Using incentive spirometer at home w/ good results.  Anxiety- pt reports she has been trying to get her xanax for 'an entire year' and is very angry that this has not been refilled.  Pt and grand-daughter are both raising their voices and saying that 'you need to tell her why she can't have it'.  Have never told pt she could not have refills, have never denied refill request.  In chart review, there is no documentation of refill not being processed (but no record of refill request recently).  Pt and family state that pt was disrespected by unknown staff member at last visit and was told that 'this will be the last time she fills it for you'.  Unclear as to who staff member was and pt's refill was never discussed w/ me on March 9th- the date in question.  Review of Systems For ROS see HPI     Objective:   Physical Exam  Vitals reviewed. Constitutional: She is oriented to person, place, and time. She appears well-developed and well-nourished. No distress.  HENT:  Head: Normocephalic and atraumatic.  Nose: Nose normal.  Mouth/Throat: Oropharynx is clear and moist. No oropharyngeal exudate.  Cardiovascular: Normal rate and regular rhythm.   Pulmonary/Chest: Effort normal and breath sounds normal. No respiratory distress. She has no wheezes. She has no rales.  No cough heard  Musculoskeletal: She exhibits no edema.  Lymphadenopathy:    She has no cervical adenopathy.  Neurological: She is alert and oriented to person, place, and time.  Skin: Skin is warm and dry.          Assessment & Plan:

## 2013-03-21 NOTE — Assessment & Plan Note (Signed)
Resolved.  Lungs clear.  No need for additional tx at this time.

## 2013-03-21 NOTE — Assessment & Plan Note (Signed)
Med refill provided on xanax.  Unclear as to drama surrounding pt's refill as I have never denied request nor stated that she could not have it.  Unclear what family would like me to do at this time, 'it's not about the medication any more'.  I offered multiple apologies for any disrespect that was shown by staff at previous visit and tried to explain that I have no issue w/ pt or medication but pt and family continued to be loud.  Ended visit w/ final apology and pt left w/ refill in hand.

## 2013-03-23 NOTE — Telephone Encounter (Signed)
Dois DavenportSandra, please contact the patient for further details for research and follow up.

## 2013-03-24 NOTE — Telephone Encounter (Addendum)
Spoke with patient at length concerning the office visit on 03/16/13. Patient stated that the person working with Selena BattenCody was in the room with her before Selena BattenCody came in getting information from her about her illness. The patient mentioned to her that she needed a refill on her Xanax. At that time the girl spoke harshly to the patient stating "if Selena BattenCody fills it, it will be the last refill she would receive." The patient stated she "felt very embarrassed and disrespected." I apologized to the patient stating that Dr. Beverely Lowabori never said that and advised that she received incorrect information from that staff member. The patient went on to state that Selena BattenCody was great that day and made up for the girl's rudeness, she also went on to say that she has since spoken to Dr. Beverely Lowabori and feels everything has been cleared up. I again apologized to the patient and thanked her for for allowing us to provide care to her.   In further review of the chart on the 03/16/13 office visit, Burnard LeighSharon Scates roomed the patient (verified by electronic signature and progress note placed in chart during visit.) This note will be forwarded to [facility] and [facility] managers for follow up. Also sent to Dr. Beverely Lowabori. pts PCP.

## 2013-05-13 ENCOUNTER — Ambulatory Visit: Payer: Medicare Other | Admitting: Family Medicine

## 2013-05-13 DIAGNOSIS — Z0289 Encounter for other administrative examinations: Secondary | ICD-10-CM

## 2013-05-14 ENCOUNTER — Telehealth: Payer: Self-pay | Admitting: *Deleted

## 2013-05-14 NOTE — Telephone Encounter (Signed)
Spoke with patient and made aware that she can get injection. Appt made for tomorrow 05/15/13 at 9:00, patient may arrive earlier.

## 2013-05-14 NOTE — Telephone Encounter (Signed)
Spoke with patient who walked into the office requesting a "cortisone shot that she gets when she travels." I advised the patient that we would have to speak with her PCP and get an order to give any type of injections. Patient stated "Dr.Tabori always gives me a shot when travel to help my back." Please advise on giving this patient an injection, she stated she leaves Saturday and could come in tomorrow.

## 2013-05-14 NOTE — Telephone Encounter (Signed)
Yes, pt gets DepoMedrol 80mg  prior to travel.  Pt no-showed appt yesterday (we could have talked about this and done injection).  She can have a nurse visit either today or tomorrow.

## 2013-05-15 ENCOUNTER — Ambulatory Visit (INDEPENDENT_AMBULATORY_CARE_PROVIDER_SITE_OTHER): Payer: Medicare Other

## 2013-05-15 DIAGNOSIS — M549 Dorsalgia, unspecified: Secondary | ICD-10-CM | POA: Diagnosis not present

## 2013-05-15 MED ORDER — METHYLPREDNISOLONE ACETATE 40 MG/ML IJ SUSP
80.0000 mg | Freq: Once | INTRAMUSCULAR | Status: AC
  Administered 2013-05-15: 80 mg via INTRAMUSCULAR

## 2013-05-18 ENCOUNTER — Ambulatory Visit: Payer: Medicare Other | Admitting: Family Medicine

## 2013-05-22 ENCOUNTER — Ambulatory Visit: Payer: Medicare Other | Admitting: Neurology

## 2013-05-27 ENCOUNTER — Ambulatory Visit: Payer: Medicare Other | Admitting: Family Medicine

## 2013-05-29 ENCOUNTER — Ambulatory Visit: Payer: Medicare Other | Admitting: Neurology

## 2013-06-02 ENCOUNTER — Encounter: Payer: Self-pay | Admitting: Neurology

## 2013-06-02 ENCOUNTER — Ambulatory Visit (INDEPENDENT_AMBULATORY_CARE_PROVIDER_SITE_OTHER): Payer: Medicare Other | Admitting: Neurology

## 2013-06-02 VITALS — BP 138/70 | HR 75 | Ht 60.0 in | Wt 129.0 lb

## 2013-06-02 DIAGNOSIS — G243 Spasmodic torticollis: Secondary | ICD-10-CM

## 2013-06-02 MED ORDER — ONABOTULINUMTOXINA 100 UNITS IJ SOLR
100.0000 [IU] | Freq: Once | INTRAMUSCULAR | Status: AC
  Administered 2013-06-02: 100 [IU] via INTRAMUSCULAR

## 2013-06-02 NOTE — Procedures (Signed)
[facility]   Procedure Note Botox  Attending: Dr. Lurena Joiner Wasif Simonich  Preoperative Diagnosis(es): Cervical Dystonia  Result History  Onset of effect: 5 d Duration of Benefit: still working, pleased with medication.   Adverse Effects: mild dysphagia last time  EXAM:  Pt with some head titubation with head turn to the R but minor.    Consent obtained from: The patient Benefits discussed included, but were not limited to decreased muscle tightness, increased joint range of motion, and decreased pain.  Risk discussed included, but were not limited pain and discomfort, bleeding, bruising, excessive weakness, venous thrombosis, muscle atrophy and dysphagia.  A copy of the patient medication guide was given to the patient which explains the blackbox warning.  Patients identity and treatment sites confirmed yes.   Details of Procedure: Skin was cleaned with alcohol.  A 30 gauge, 1/2 inch needle was introduced to the target muscles. Prior to injection, the needle plunger was aspirated to make sure the needle was not within a blood vessel.  There was no blood retrieved on aspiration.    Following is a summary of the muscles injected  And the amount of Botulinum toxin used:   Dilution 0.9% preservative free saline mixed with 100 u Botox type A to make 10 U per 0.1cc  Injections  Location Left  Right Units Number of sites        Sternocleidomastoid 30+30  60   Splenius Capitus, posterior approach      Splenius Capitus, lateral approach  40    Levator Scapulae      Trapezius            TOTAL UNITS:   100    Agent: Botulinum Type A ( Onobotulinum Toxin type A ).  1 vials of Botox were used, each containing 100 units and freshly diluted with 1 mL of sterile, non-preserved saline   Total injected (Units): 100  Total wasted (Units): none wasted   Pt tolerated procedure well without complications.   Reinjection is anticipated in 3 months.

## 2013-06-02 NOTE — Progress Notes (Signed)
Procedural encounter only

## 2013-06-03 ENCOUNTER — Encounter: Payer: Self-pay | Admitting: Internal Medicine

## 2013-06-03 ENCOUNTER — Ambulatory Visit (INDEPENDENT_AMBULATORY_CARE_PROVIDER_SITE_OTHER): Payer: Medicare Other | Admitting: *Deleted

## 2013-06-03 DIAGNOSIS — Z95810 Presence of automatic (implantable) cardiac defibrillator: Secondary | ICD-10-CM | POA: Diagnosis not present

## 2013-06-03 DIAGNOSIS — I4729 Other ventricular tachycardia: Secondary | ICD-10-CM | POA: Diagnosis not present

## 2013-06-03 DIAGNOSIS — I472 Ventricular tachycardia: Secondary | ICD-10-CM

## 2013-06-03 DIAGNOSIS — I5022 Chronic systolic (congestive) heart failure: Secondary | ICD-10-CM | POA: Diagnosis not present

## 2013-06-03 NOTE — Progress Notes (Signed)
Remote ICD transmission.   

## 2013-06-16 LAB — MDC_IDC_ENUM_SESS_TYPE_REMOTE
Battery Remaining Longevity: 25 mo
Battery Voltage: 2.86 V
Brady Statistic AS VP Percent: 98 %
Brady Statistic AS VS Percent: 1 %
Brady Statistic RA Percent Paced: 1 %
Date Time Interrogation Session: 20150527142512
HighPow Impedance: 37 Ohm
Implantable Pulse Generator Serial Number: 815822
Lead Channel Impedance Value: 390 Ohm
Lead Channel Pacing Threshold Amplitude: 2 V
Lead Channel Pacing Threshold Pulse Width: 0.5 ms
Lead Channel Sensing Intrinsic Amplitude: 3.2 mV
Lead Channel Setting Pacing Amplitude: 2 V
Lead Channel Setting Pacing Amplitude: 3 V
Lead Channel Setting Pacing Pulse Width: 0.5 ms
MDC IDC MSMT BATTERY REMAINING PERCENTAGE: 42 %
MDC IDC MSMT LEADCHNL RA IMPEDANCE VALUE: 430 Ohm
MDC IDC MSMT LEADCHNL RV IMPEDANCE VALUE: 650 Ohm
MDC IDC MSMT LEADCHNL RV SENSING INTR AMPL: 8.7 mV
MDC IDC SET LEADCHNL LV PACING AMPLITUDE: 2.75 V
MDC IDC SET LEADCHNL LV PACING PULSEWIDTH: 0.8 ms
MDC IDC SET LEADCHNL RV SENSING SENSITIVITY: 0.5 mV
MDC IDC SET ZONE DETECTION INTERVAL: 270 ms
MDC IDC STAT BRADY AP VP PERCENT: 1 %
MDC IDC STAT BRADY AP VS PERCENT: 1 %
Zone Setting Detection Interval: 300 ms
Zone Setting Detection Interval: 365 ms

## 2013-07-08 ENCOUNTER — Encounter: Payer: Self-pay | Admitting: Cardiology

## 2013-07-23 ENCOUNTER — Other Ambulatory Visit: Payer: Self-pay | Admitting: Family Medicine

## 2013-07-24 NOTE — Telephone Encounter (Signed)
Med filled.  

## 2013-08-03 ENCOUNTER — Ambulatory Visit (HOSPITAL_BASED_OUTPATIENT_CLINIC_OR_DEPARTMENT_OTHER)
Admission: RE | Admit: 2013-08-03 | Discharge: 2013-08-03 | Disposition: A | Discharge status: Home / Self Care | Payer: Medicare Other | Source: Ambulatory Visit | Attending: Physician Assistant | Admitting: Physician Assistant

## 2013-08-03 ENCOUNTER — Ambulatory Visit (INDEPENDENT_AMBULATORY_CARE_PROVIDER_SITE_OTHER): Payer: Medicare Other | Admitting: Physician Assistant

## 2013-08-03 ENCOUNTER — Encounter: Payer: Self-pay | Admitting: Physician Assistant

## 2013-08-03 VITALS — BP 140/72 | HR 88 | Temp 98.1°F | Resp 14 | Ht 60.0 in | Wt 127.4 lb

## 2013-08-03 DIAGNOSIS — S8010XA Contusion of unspecified lower leg, initial encounter: Secondary | ICD-10-CM | POA: Diagnosis not present

## 2013-08-03 DIAGNOSIS — S79921A Unspecified injury of right thigh, initial encounter: Secondary | ICD-10-CM

## 2013-08-03 DIAGNOSIS — Z23 Encounter for immunization: Secondary | ICD-10-CM

## 2013-08-03 DIAGNOSIS — S79929A Unspecified injury of unspecified thigh, initial encounter: Secondary | ICD-10-CM | POA: Diagnosis not present

## 2013-08-03 DIAGNOSIS — I2589 Other forms of chronic ischemic heart disease: Secondary | ICD-10-CM | POA: Diagnosis not present

## 2013-08-03 DIAGNOSIS — X58XXXA Exposure to other specified factors, initial encounter: Secondary | ICD-10-CM | POA: Insufficient documentation

## 2013-08-03 DIAGNOSIS — M949 Disorder of cartilage, unspecified: Secondary | ICD-10-CM

## 2013-08-03 DIAGNOSIS — M899 Disorder of bone, unspecified: Secondary | ICD-10-CM | POA: Insufficient documentation

## 2013-08-03 DIAGNOSIS — S99919A Unspecified injury of unspecified ankle, initial encounter: Secondary | ICD-10-CM | POA: Diagnosis not present

## 2013-08-03 DIAGNOSIS — S8011XA Contusion of right lower leg, initial encounter: Secondary | ICD-10-CM

## 2013-08-03 DIAGNOSIS — S79919A Unspecified injury of unspecified hip, initial encounter: Secondary | ICD-10-CM

## 2013-08-03 DIAGNOSIS — S8990XA Unspecified injury of unspecified lower leg, initial encounter: Secondary | ICD-10-CM | POA: Diagnosis not present

## 2013-08-03 NOTE — Telephone Encounter (Signed)
Patient was again seen today by Piedad ClimesWilliam Cody Martin, PA; at this time and for the first time I was informed by Selena Battenody that when we had [facility] the [facility] that the patient [pt's granddaughter] had called office and complained afterwards about the issue with the Alprazolam; even my provider states "after you went out of your way to help resolve the issue when they were in the office". Firstly, I find this disturbing that complaints against my professionalism & character were placed and no supervisor and/or manager brought this to my attention for rebuttal. Secondly, there was an issue with the patient's scheduling that morning that pt's granddaughter was upset about because it caused them to have to wait when they arrived. With the issue with the Alprazolam, I was told by the pt's granddaughter Dennis Bast[who was there at the visit] that patient had been told for over [1] month that her medication had been sent to the pharmacy and it was never there, and they were very upset about this; I explained to them in a professional manner, as I always use with patients that we were not patient's PCP and that the visit scheduled with Mr. Daphine DeutscherMartin was for an Acute issue, so this would be the provider's discretion if he would choose to fill the control Rx. At that time, since there had been such a long standing issue, I advised patient that I would be glad to go over to Dr. Rennis Goldenabori's [PCP] assistant and speak with her about this matter and I went over to pt's PCP and personally spoke with CMA & PCP regarding this matter and explained to them that I had personally called the pharmacy and they stated they had not received a refill authorization since the January script. I received Verbal authorization from Dr. Beverely Lowabori, after reviewing patient's medical record to phone in #20 with no refills [as Rx history shows] and to inform patient that she would need follow-up OV before she could obtain further refills d/t  patient had not been seen since 11.13.2014 prior [accept for Acutes], this is where the message about "no further medication could be authorized w/o F/U visit; NOT it would be the last refill she would receive", I would never speak this way to a patient and again I am very disturbed that this matter was not brought to my attention at the time it was lodged, as this could have been handled properly then. I again called the pharmacist before the patient left the office that day to make sure that her medication authorization had been received and verify that it would be ready for pick-up when they arrived. 03.13.15 OV: Anxiety- pt reports she has been trying to get her xanax for 'an entire year' and is very angry that this has not been refilled. Pt and grand-daughter are both raising their voices and saying that 'you need to tell her why she can't have it'. Have never told pt she could not have refills, have never denied refill request. In chart review, there is no documentation of refill not being processed (but no record of refill request recently). Pt and family state that pt was disrespected by unknown staff member at last visit and was told that 'this will be the last time she fills it for you'. Unclear as to who staff member was and pt's refill was never discussed w/ me on March 9th- the date in question. Again, there seemed to be an issue on 03.13.15 visit with the same matter that  had been handled on 03.09.15 and another Rx for the same medication was issued?????? Patient informed Mr. Daphine Deutscher at 07.27.15 visit [pt was alone] that she really liked him & me but that her granddaughter had been upset at previous visit. Once again, I would like to reiterate that matters that involve someone's character & integrity should be handled in a thorough professional manner, as to not unintentionally place a defamatory statement or report against someone's name/SLS

## 2013-08-03 NOTE — Progress Notes (Signed)
Pre visit review using our clinic review tool, if applicable. No additional management support is needed unless otherwise documented below in the visit note/SLS  

## 2013-08-03 NOTE — Patient Instructions (Signed)
Please keep wound clean and dry.  Apply a topical neosporin to the wound.  Monitor for redness or tenderness. Please go to the [facility] at [address] to the 1st floor for your x-ray.  Please follow directions given by Jasmine DecemberSharon.  I will call you with your results. Please be careful not to bump your leg into anything.

## 2013-08-04 ENCOUNTER — Telehealth: Payer: Self-pay

## 2013-08-04 NOTE — Telephone Encounter (Signed)
Radiology is looking into this and will call us back 

## 2013-08-04 NOTE — Telephone Encounter (Signed)
[name] (I believe) called back and states radiology is looking at this and will send results.  They stated if the results need to be back to put STAT on order 

## 2013-08-04 NOTE — Telephone Encounter (Signed)
Notified Provider that xray shows Osteopenia, no fractures. He requests that we tell pt no fracture seen and to continue care as discussed at office visit. Left message for pt to return my call.

## 2013-08-04 NOTE — Telephone Encounter (Signed)
Message copied by Court JoyFREEMAN, Shayna Eblen L on Tue Aug 04, 2013  8:05 AM ------      Message from: Marcelline MatesMARTIN, WILLIAM      Created: Tue Aug 04, 2013  7:28 AM       Can we call Radiology downstairs to see why patient's x-ray has not resulted.  She went for imaging and the study ended at around 5:30 PM yesterday.  I do not understand why results are not in.       If you happen to get results, can you call my cell phone [phone] to give them to me as I will be at the hospital all morning seeing my grandmother and will not have access to EPIC.            Thank you very much. ------

## 2013-08-05 DIAGNOSIS — S79919A Unspecified injury of unspecified hip, initial encounter: Secondary | ICD-10-CM | POA: Diagnosis not present

## 2013-08-05 DIAGNOSIS — S79929A Unspecified injury of unspecified thigh, initial encounter: Secondary | ICD-10-CM | POA: Insufficient documentation

## 2013-08-05 DIAGNOSIS — Z23 Encounter for immunization: Secondary | ICD-10-CM | POA: Insufficient documentation

## 2013-08-05 DIAGNOSIS — S8010XA Contusion of unspecified lower leg, initial encounter: Secondary | ICD-10-CM | POA: Insufficient documentation

## 2013-08-05 NOTE — Assessment & Plan Note (Signed)
Small.  Benign.  Discussed possible rupture of hematoma and resulting appearance of leg.  Patient voices understanding.

## 2013-08-05 NOTE — Progress Notes (Signed)
Patient presents to clinic today c/o injury to her left lower leg and shin 2 days ago, when a wooden bench fell on her leg.  Patient denies fall or trauma elsewhere.  Has had some pain, swelling and bruising.  Patient is 80 years old and at a high risk of fracture.  Patient has tried to keep the area clean and dry.  Has put some ice on her leg for swelling.  Denies difficulty with weight-bearing or ambulation.  Past Medical History  Diagnosis Date   Chest pain 12/09    recent hospitalization for it. felt to be noncardiac.    CAD (coronary artery disease)     a. s/p prior anterior wall myocardial infarction and multiple percutaneous coronary intervention.;  b. Aden MV 1/14:  low risk, inf and apical defect likely due to atten artifact, no ischemia, EF 35% (visually normal and est 55-60%)   Ischemic cardiomyopathy     EF 25-30% improved to 45% with biventricular pacing.    Systolic heart failure     improved to class I to II    Automatic implantable cardioverter/defibrillator (AICD) activation     s/p implantable cardioverter-defibrillator bivenrticular pacer. 12/05. St, [employer]. remote-no.   HTN (hypertension)    Hyperlipidemia    GERD (gastroesophageal reflux disease)    Esophageal stricture     last dialated 2007   Hiatal hernia    DD (diverticular disease)     Hx of it - severe. Left colon 2004   Other urinary problems     urinary incontenance   UTI (lower urinary tract infection)     recurrent. (Klebsiella pneumoniae). Last Cx 02/03/09. (R-ancef, nitrofurantoin, Augmentin, Zyosyn)   Arthritis    PVT (paroxysmal ventricular tachycardia)    CHF (congestive heart failure)    Hypothyroidism    Anxiety     sleep   Pacemaker    Automatic implantable cardioverter-defibrillator in situ    Heart murmur    History of recurrent UTIs    Shortness of breath     with exertion   Urgency of urination    Frequent urination at night     Current Outpatient  Prescriptions on File Prior to Visit  Medication Sig Dispense Refill   ALPRAZolam (XANAX) 0.25 MG tablet Take 1 tablet (0.25 mg total) by mouth 2 (two) times daily as needed for anxiety or sleep.  30 tablet  3   aspirin (ASPIR-81) 81 MG EC tablet Take 81 mg by mouth daily.         furosemide (LASIX) 20 MG tablet Take 1 tablet (20 mg total) by mouth daily as needed for fluid or edema.  90 tablet  1   isosorbide mononitrate (IMDUR) 30 MG 24 hr tablet TAKE 1 AND 1/2 TABLETS EVERY MORNING  45 tablet  8   levothyroxine (SYNTHROID, LEVOTHROID) 100 MCG tablet Take 1 tablet (100 mcg total) by mouth daily.  90 tablet  3   metoprolol succinate (TOPROL-XL) 50 MG 24 hr tablet Take 50 mg by mouth 2 (two) times daily. Take with or immediately following a meal.       nitroGLYCERIN (NITROSTAT) 0.4 MG SL tablet Place 1 tablet (0.4 mg total) under the tongue every 5 (five) minutes as needed. At the onset of chest pain, Up to 3 doses  10 tablet  0   omeprazole (PRILOSEC) 20 MG capsule Take 20 mg by mouth daily as needed (for reflux).       OVER THE COUNTER MEDICATION Take  1 tablet by mouth daily as needed (for allergiesallertec allergy relief  zyrtec).        pravastatin (PRAVACHOL) 20 MG tablet TAKE 1 TABLET (20 MG TOTAL) BY MOUTH AT BEDTIME.  90 tablet  1   Psyllium (METAFIBER) 48.57 % POWD Take 10 mLs by mouth every evening.        ramipril (ALTACE) 5 MG capsule Take 5 mg by mouth daily.       No current facility-administered medications on file prior to visit.    Allergies  Allergen Reactions   Sulfonamide Derivatives     REACTION: rash and itching    Family History  Problem Relation Age of Onset   Colon cancer Neg Hx    Colon polyps Sister    Heart disease Mother    Heart disease Father    Heart disease Brother     multiple brothers   Heart disease Sister     multiple sisters   Prostate cancer Brother    Diabetes Daughter    Diabetes      aunt   Breast cancer  Daughter    Alcohol abuse Son     History   Social History   Marital Status: Widowed    Spouse Name: N/A    Number of Children: N/A   Years of Education: N/A   Social History Main Topics   Smoking status: Never Smoker    Smokeless tobacco: Never Used   Alcohol Use: No   Drug Use: No   Sexual Activity: None   Other Topics Concern   None   Social History Narrative   Widowed, husband died of brain tumor. Son [place], [place]- was in a MVA for DWI. He has had a long hx of alcohol problems, she has helped him out financially in the past but is ready to be much stricter. Pt. Has a daughter who live in [place] with Breast Ca. She also lost a son to a brain tumor.     Review of Systems - See HPI.  All other ROS are negative.  BP 140/72   Pulse 88   Temp(Src) 98.1 F (36.7 C) (Oral)   Resp 14   Ht 5' (1.524 m)   Wt 127 lb 6 oz (57.777 kg)   BMI 24.88 kg/m2   SpO2 94%  Physical Exam  Vitals reviewed. Constitutional: She is well-developed, well-nourished, and in no distress.  HENT:  Head: Normocephalic and atraumatic.  Eyes: Conjunctivae are normal. Pupils are equal, round, and reactive to light.  Cardiovascular: Normal rate, regular rhythm, normal heart sounds and intact distal pulses.   Pulmonary/Chest: Effort normal and breath sounds normal. No respiratory distress. She has no wheezes. She has no rales. She exhibits no tenderness.  Musculoskeletal:       Legs: Skin: Skin is warm and dry.    Recent Results (from the past 2160 hour(s))  MDC_IDC_ENUM_SESS_TYPE_REMOTE     Status: None   Collection Time    06/16/13  3:56 PM      Result Value Ref Range   Date Time Interrogation Session [id number]     Pulse Generator Manufacturer [employer]     Pulse Gen Model 3231-40 Unify     Pulse Gen Serial Number [id number]     RV Sense Sensitivity 0.5     RV Adaptation Mode Adaptive Sensing     LV Pace PulseWidth 0.8     LV Pace Amplitude 2.75     LV Pace Capture Mode  Fixed Pacing     RA Pace Amplitude 2     RV Pace PulseWidth 0.5     RV Pace Amplitude 3     Zone Setting Type Category VF     Zone Detect Interval 270     Zone Setting Type Category VT     Zone Detect Interval 300     Zone Setting Type Category VENTRICULAR_TACHYCARDIA_1     Zone Detect Interval 365     Lead Channel Status      LV Impedance 390     Lead Channel Status      RA Impedance 430     RA Amplitude 3.2     Lead Channel Status      RV IMPEDANCE 650     RV Amplitude 8.7     RV Pacing Amplitude 2     RV Pacing PulseWidth 0.5     HighPow Impedance 37     HighPow Imped Status      Battery Status MOS     Battery Longevity 25     Battery Percent 42     Battery Voltage 2.86     Brady RA Perc Paced 1     Brady AP VP Percent 1     Brady AS VP Percent 98     Brady AP VS Percent 1     Brady AS VS Percent 1     Eval Rhythm AS/BP     Miscellaneous Comment       Value: Remote CRT-D device check. Thresholds and sensing consistent with previous device measurements. Lead impedance trends stable over time. 6 mode switch episodes recorded--longest was 8 seconds. No ventricular arrhythmia episodes recorded. Patient      bi-ventricularly pacing 99% of the time. Device programmed with appropriate safety margins. CorVue increase beginning of April x 9 days and end of February/beginning of March x 10 days but stable at this time.  Estimated longevity 1.8 to 2.4 years.      Merlin 09-08-13 and ROV in February with SK.    Assessment/Plan: Injury to upper leg Suspect contusion but will obtain x-ray to rule out fracture.  Small abrasion clean and bandaged with application of topical antibiotic ointment. OTC medications as needed for pain.  Avoid overexertion.  ICE for swelling.   Hematoma of leg Small.  Benign.  Discussed possible rupture of hematoma and resulting appearance of leg.  Patient voices understanding.  Need for prophylactic vaccination with tetanus-diphtheria (TD) TD  > 5 years ago.  Giving incident, TD given by nursing staff.

## 2013-08-05 NOTE — Assessment & Plan Note (Signed)
TD > 5 years ago.  Giving incident, TD given by nursing staff.

## 2013-08-05 NOTE — Assessment & Plan Note (Signed)
Suspect contusion but will obtain x-ray to rule out fracture.  Small abrasion clean and bandaged with application of topical antibiotic ointment. OTC medications as needed for pain.  Avoid overexertion.  ICE for swelling.

## 2013-08-07 NOTE — Telephone Encounter (Signed)
Notified pt and she voices understanding. 

## 2013-08-19 ENCOUNTER — Encounter: Payer: Self-pay | Admitting: Medical

## 2013-08-19 ENCOUNTER — Other Ambulatory Visit: Payer: Self-pay | Admitting: Neurology

## 2013-08-19 ENCOUNTER — Ambulatory Visit (INDEPENDENT_AMBULATORY_CARE_PROVIDER_SITE_OTHER): Payer: Medicare Other | Admitting: Medical

## 2013-08-19 VITALS — BP 130/60 | HR 88 | Temp 98.6°F | Wt 131.3 lb

## 2013-08-19 DIAGNOSIS — L089 Local infection of the skin and subcutaneous tissue, unspecified: Secondary | ICD-10-CM

## 2013-08-19 DIAGNOSIS — I2589 Other forms of chronic ischemic heart disease: Secondary | ICD-10-CM | POA: Diagnosis not present

## 2013-08-19 DIAGNOSIS — G243 Spasmodic torticollis: Secondary | ICD-10-CM

## 2013-08-19 MED ORDER — DOXYCYCLINE HYCLATE 100 MG PO TABS: 100.0000 mg | ORAL_TABLET | Freq: Two times a day (BID) | ORAL | Status: DC

## 2013-08-19 NOTE — Progress Notes (Signed)
Pre visit review using our clinic review tool, if applicable. No additional management support is needed unless otherwise documented below in the visit note. 

## 2013-08-19 NOTE — Assessment & Plan Note (Signed)
Secondary infection following trauma and abrasion. Wound culture pending. Doxycycline prescription written today. Rx advisement and advised to use probiotics while on antibiotic.

## 2013-08-19 NOTE — Patient Instructions (Signed)
You appear to have skin infection secondary to leg trauma abrasion area. I did wound culture today and will send that out. When results are in will notify you of result. I am writing antibiotic for your infection.(Please do not take the antibiotic on empty stomach). Follow up in 7 days for check of leg or sooner if needed.

## 2013-08-19 NOTE — Progress Notes (Signed)
Subjective:    Patient ID: [name], female    DOB: 06-Aug-1921, 80 y.o.   MRN: [id number]  HPI  Pt in for follow up. I reviewed prior note. Also reviewed xray of tib/fib and no fracture. Pt continues with some pain despite it being about 2 wks from accident. Pt has persisting redness around the wound site. No fevers, no chills, no sweats.  Past Medical History  Diagnosis Date  . Chest pain 12/09    recent hospitalization for it. felt to be noncardiac.   [name] CAD (coronary artery disease)     a. s/p prior anterior wall myocardial infarction and multiple percutaneous coronary intervention.;  b. [place] MV 1/14:  low risk, inf and apical defect likely due to atten artifact, no ischemia, EF 35% (visually normal and est 55-60%)  . Ischemic cardiomyopathy     EF 25-30% improved to 45% with biventricular pacing.   . Systolic heart failure     improved to class I to II   . Automatic implantable cardioverter/defibrillator (AICD) activation     s/p implantable cardioverter-defibrillator bivenrticular pacer. 12/05. St, [employer]. remote-no.  [name] HTN (hypertension)   . Hyperlipidemia   . GERD (gastroesophageal reflux disease)   . Esophageal stricture     last dialated 2007  . Hiatal hernia   . DD (diverticular disease)     Hx of it - severe. Left colon 2004  . Other urinary problems     urinary incontenance  . UTI (lower urinary tract infection)     recurrent. (Klebsiella pneumoniae). Last Cx 02/03/09. (R-ancef, nitrofurantoin, Augmentin, Zyosyn)  . Arthritis   . PVT (paroxysmal ventricular tachycardia)   . CHF (congestive heart failure)   . Hypothyroidism   . Anxiety     sleep  . Pacemaker   . Automatic implantable cardioverter-defibrillator in situ   . Heart murmur   . History of recurrent UTIs   . Shortness of breath     with exertion  . Urgency of urination   . Frequent urination at night     History   Social History  . Marital Status: Widowed    Spouse Name: N/A   Number of Children: N/A  . Years of Education: N/A   Occupational History  . Not on file.   Social History Main Topics  . Smoking status: Never Smoker   . Smokeless tobacco: Never Used  . Alcohol Use: No  . Drug Use: No  . Sexual Activity: Not on file   Other Topics Concern  . Not on file   Social History Narrative   Widowed, husband died of brain tumor. Son lives in [place], [place]- was in a MVA for DWI. He has had a long hx of alcohol problems, she has helped him out financially in the past but is ready to be much stricter. Pt. Has a daughter who live in [place] with Breast Ca. She also lost a son to a brain tumor.     Past Surgical History  Procedure Laterality Date  . Angioplasty      stent  . Unspecified area hysterectomy    . Tubal ligation    . Appendectomy    . Cardiac defibrillator placement      [facility]  . Cataract extraction, bilateral    . Total knee arthroplasty      bilateral  . Tonsillectomy    . Hammertoe reconstruction with weil osteotomy Right 07/17/2012    Procedure: RIGHT SECOND AND THIRD  MT WEIL OSTEOTOMIES AND SECOND AND THIRD HAMMERTOE CORRECTIONS;  Surgeon: Toni ArthursJohn Hewitt, MD;  Location: [facility] OR;  Service: Orthopedics;  Laterality: Right;  . Colonoscopy w/ polypectomy    . Cardiac catheterization      stent  . Insert / replace / remove pacemaker    . Hammer toe surgery Left 01/15/2013    Procedure: LEFT SECOND THROUGH FOURTH HAMMERTOE CORRECTION ;  Surgeon: Toni ArthursJohn Hewitt, MD;  Location: [facility] OR;  Service: Orthopedics;  Laterality: Left;  . Weil osteotomy Left 01/15/2013    Procedure:  WEIL OSTEOTOMY AND DORSAL CAPSULOTOMY;  Surgeon: Toni ArthursJohn Hewitt, MD;  Location: [facility] OR;  Service: Orthopedics;  Laterality: Left;    Family History  Problem Relation Age of Onset  . Colon cancer Neg Hx   . Colon polyps Sister   . Heart disease Mother   . Heart disease Father   . Heart disease Brother     multiple brothers  . Heart disease Sister     multiple sisters  . Prostate  cancer Brother   . Diabetes Daughter   . Diabetes      aunt  . Breast cancer Daughter   . Alcohol abuse Son     Allergies  Allergen Reactions  . Sulfonamide Derivatives     REACTION: rash and itching    Current Outpatient Prescriptions on File Prior to Visit  Medication Sig Dispense Refill  . ALPRAZolam (XANAX) 0.25 MG tablet Take 1 tablet (0.25 mg total) by mouth 2 (two) times daily as needed for anxiety or sleep.  30 tablet  3  . aspirin (ASPIR-81) 81 MG EC tablet Take 81 mg by mouth daily.        . furosemide (LASIX) 20 MG tablet Take 1 tablet (20 mg total) by mouth daily as needed for fluid or edema.  90 tablet  1  . isosorbide mononitrate (IMDUR) 30 MG 24 hr tablet TAKE 1 AND 1/2 TABLETS EVERY MORNING  45 tablet  8  . levothyroxine (SYNTHROID, LEVOTHROID) 100 MCG tablet Take 1 tablet (100 mcg total) by mouth daily.  90 tablet  3  . metoprolol succinate (TOPROL-XL) 50 MG 24 hr tablet Take 50 mg by mouth 2 (two) times daily. Take with or immediately following a meal.      . nitroGLYCERIN (NITROSTAT) 0.4 MG SL tablet Place 1 tablet (0.4 mg total) under the tongue every 5 (five) minutes as needed. At the onset of chest pain, Up to 3 doses  10 tablet  0  . omeprazole (PRILOSEC) 20 MG capsule Take 20 mg by mouth daily as needed (for reflux).      [name]. OVER THE COUNTER MEDICATION Take 1 tablet by mouth daily as needed (for allergiesallertec allergy relief  zyrtec).       . pravastatin (PRAVACHOL) 20 MG tablet TAKE 1 TABLET (20 MG TOTAL) BY MOUTH AT BEDTIME.  90 tablet  1  . Psyllium (METAFIBER) 48.57 % POWD Take 10 mLs by mouth every evening.       . ramipril (ALTACE) 5 MG capsule Take 5 mg by mouth daily.       No current facility-administered medications on file prior to visit.    BP 130/60  Pulse 88  Temp(Src) 98.6 F (37 C) (Oral)  Wt 131 lb 4.8 oz (59.557 kg)  SpO2 97%     Review of Systems  Constitutional: Negative for fever and chills.  Respiratory: Negative for  cough, chest tightness and wheezing.   Cardiovascular: Negative for  chest pain and palpitations.  Skin:       Redness and mild tender around prior abrasion/injury site.  Neurological: Negative.   Hematological: Negative.        Objective:   Physical Exam  General- no acute distress, Pleasant. Lungs- clear, even and unlabored. Heart- regular, rate and rhythm. Rt lower ext(skin)-6cm x 6cm red area, tender and mild warm to touch. Center has about 1cm linear abrasion oriented in vertical manner mid tibia. Some scabbing and some dry yellow discharge with scab. Negative homans signs. No lymphadenopathy. No tracking up leg.        Assessment & Plan:

## 2013-08-20 ENCOUNTER — Encounter: Payer: Self-pay | Admitting: Family Medicine

## 2013-08-20 ENCOUNTER — Ambulatory Visit (HOSPITAL_BASED_OUTPATIENT_CLINIC_OR_DEPARTMENT_OTHER)
Admission: RE | Admit: 2013-08-20 | Discharge: 2013-08-20 | Disposition: A | Discharge status: Home / Self Care | Payer: Medicare Other | Source: Ambulatory Visit | Attending: Family Medicine | Admitting: Family Medicine

## 2013-08-20 ENCOUNTER — Ambulatory Visit (INDEPENDENT_AMBULATORY_CARE_PROVIDER_SITE_OTHER): Payer: Medicare Other | Admitting: Family Medicine

## 2013-08-20 ENCOUNTER — Encounter (HOSPITAL_BASED_OUTPATIENT_CLINIC_OR_DEPARTMENT_OTHER): Payer: Self-pay

## 2013-08-20 VITALS — BP 152/100 | HR 72 | Temp 98.0°F | Resp 16 | Wt 131.5 lb

## 2013-08-20 DIAGNOSIS — I10 Essential (primary) hypertension: Secondary | ICD-10-CM | POA: Diagnosis not present

## 2013-08-20 DIAGNOSIS — I2589 Other forms of chronic ischemic heart disease: Secondary | ICD-10-CM | POA: Diagnosis not present

## 2013-08-20 DIAGNOSIS — R51 Headache: Secondary | ICD-10-CM

## 2013-08-20 DIAGNOSIS — R519 Headache, unspecified: Secondary | ICD-10-CM

## 2013-08-20 NOTE — Assessment & Plan Note (Signed)
Deteriorated.  Pt feels this is due to current stress of losing sister and ensuing family drama.  Has f/u appt next week for cellulitis, will recheck BP at that time and adjust meds prn.  Pt expressed understanding and is in agreement w/ plan.

## 2013-08-20 NOTE — Patient Instructions (Signed)
Follow up as scheduled next week Please discuss your headaches w/ Dr Tat tomorrow We'll notify you of your CT results Continue the Doxycycline as prescribed yesterday Call with any questions or concerns Hang in there!

## 2013-08-20 NOTE — Assessment & Plan Note (Signed)
New.  Given pt's hx of head injury in Feb will get CT to r/o slow bleed, mass, or hematoma.  Pt has neuro appt tomorrow- encouraged her to discuss this.  No obvious abnormalities on PE.  Will follow closely.

## 2013-08-20 NOTE — Progress Notes (Signed)
   Subjective:    Patient ID: [name], female    DOB: Nov 03, 1921, 80 y.o.   MRN: [id number]  Headache    HA- started 'a while' ago, isn't sure how long after she hit her head in Feb.  HAs are occuring at night, will wake her from sleep.  HAs are diffuse and seem to encompass the top of her head.  No nausea, dizziness.  Denies sinus pain/pressure.  No visual changes.  HAs are not nightly, occuring ~3x/week.  Pt has noticed some memory loss- 'too quickly'.  Has Neuro appt tomorrow.  HTN- chronic problem, pt's BP is usually well controlled but elevated today b/c she recently lost a sister and there is some family drama w/ her children.  No CP, SOB, visual changes, edema.   Review of Systems  Neurological: Positive for headaches.   For ROS see HPI     Objective:   Physical Exam  Vitals reviewed. Constitutional: She is oriented to person, place, and time. She appears well-developed and well-nourished. No distress.  HENT:  Head: Normocephalic and atraumatic.  TMs WNL No TTP over sinuses Minimal nasal congestion  Eyes: Conjunctivae and EOM are normal. Pupils are equal, round, and reactive to light.  Neck: Normal range of motion. Neck supple.  Cardiovascular: Normal rate, regular rhythm, normal heart sounds and intact distal pulses.   Pulmonary/Chest: Effort normal and breath sounds normal. No respiratory distress. She has no wheezes. She has no rales.  Lymphadenopathy:    She has no cervical adenopathy.  Neurological: She is alert and oriented to person, place, and time. She has normal reflexes.  Mild head tremor Walking w/ cane- stooped posture  Psychiatric: She has a normal mood and affect. Her behavior is normal. Judgment and thought content normal.          Assessment & Plan:

## 2013-08-20 NOTE — Progress Notes (Signed)
Pre visit review using our clinic review tool, if applicable. No additional management support is needed unless otherwise documented below in the visit note. 

## 2013-08-21 ENCOUNTER — Ambulatory Visit (INDEPENDENT_AMBULATORY_CARE_PROVIDER_SITE_OTHER): Payer: Medicare Other | Admitting: Neurology

## 2013-08-21 ENCOUNTER — Telehealth: Payer: Self-pay | Admitting: Medical

## 2013-08-21 ENCOUNTER — Encounter: Payer: Self-pay | Admitting: Neurology

## 2013-08-21 ENCOUNTER — Other Ambulatory Visit: Payer: Self-pay | Admitting: Neurology

## 2013-08-21 VITALS — BP 122/86 | HR 92 | Resp 18 | Ht 59.0 in | Wt 130.0 lb

## 2013-08-21 DIAGNOSIS — G243 Spasmodic torticollis: Secondary | ICD-10-CM | POA: Diagnosis not present

## 2013-08-21 MED ORDER — ONABOTULINUMTOXINA 100 UNITS IJ SOLR
100.0000 [IU] | Freq: Once | INTRAMUSCULAR | Status: AC
  Administered 2013-08-21: 100 [IU] via INTRAMUSCULAR

## 2013-08-21 NOTE — Telephone Encounter (Signed)
Call and see how pt leg is. Better from infection?

## 2013-08-21 NOTE — Telephone Encounter (Signed)
Pt states feeling better.

## 2013-08-21 NOTE — Procedures (Signed)
[facility]   Procedure Note Botox  Attending: Dr. Lurena Joinerebecca Tat  Preoperative Diagnosis(es): Cervical Dystonia  Result History  Onset of effect: 5 d Duration of Benefit: still working, pleased with medication.   Adverse Effects: mild dysphagia last time  EXAM:  Pt with some head titubation with head turn to the R  Consent obtained from: The patient Benefits discussed included, but were not limited to decreased muscle tightness, increased joint range of motion, and decreased pain.  Risk discussed included, but were not limited pain and discomfort, bleeding, bruising, excessive weakness, venous thrombosis, muscle atrophy and dysphagia.  A copy of the patient medication guide was given to the patient which explains the blackbox warning.  Patients identity and treatment sites confirmed yes.   Details of Procedure: Skin was cleaned with alcohol.  A 30 gauge, 1/2 inch needle was introduced to the target muscles. Prior to injection, the needle plunger was aspirated to make sure the needle was not within a blood vessel.  There was no blood retrieved on aspiration.    Following is a summary of the muscles injected  And the amount of Botulinum toxin used:   Dilution 0.9% preservative free saline mixed with 100 u Botox type A to make 10 U per 0.1cc  Injections  Location Left  Right Units Number of sites        Sternocleidomastoid 30+30  60   Splenius Capitus, posterior approach      Splenius Capitus, lateral approach  40    Levator Scapulae      Trapezius            TOTAL UNITS:   100    Agent: Botulinum Type A ( Onobotulinum Toxin type A ).  1 vials of Botox were used, each containing 100 units and freshly diluted with 1 mL of sterile, non-preserved saline   Total injected (Units): 100  Total wasted (Units): none wasted   Pt tolerated procedure well without complications.   Reinjection is anticipated in 3 months.

## 2013-08-22 LAB — WOUND CULTURE: GRAM STAIN: NONE SEEN

## 2013-08-24 ENCOUNTER — Encounter: Payer: Self-pay | Admitting: Internal Medicine

## 2013-08-28 ENCOUNTER — Ambulatory Visit (INDEPENDENT_AMBULATORY_CARE_PROVIDER_SITE_OTHER): Payer: Medicare Other | Admitting: Family Medicine

## 2013-08-28 ENCOUNTER — Encounter: Payer: Self-pay | Admitting: Family Medicine

## 2013-08-28 VITALS — BP 128/68 | HR 87 | Temp 98.0°F | Resp 16 | Wt 128.1 lb

## 2013-08-28 DIAGNOSIS — I10 Essential (primary) hypertension: Secondary | ICD-10-CM | POA: Diagnosis not present

## 2013-08-28 DIAGNOSIS — L089 Local infection of the skin and subcutaneous tissue, unspecified: Secondary | ICD-10-CM

## 2013-08-28 DIAGNOSIS — E785 Hyperlipidemia, unspecified: Secondary | ICD-10-CM

## 2013-08-28 DIAGNOSIS — I2589 Other forms of chronic ischemic heart disease: Secondary | ICD-10-CM

## 2013-08-28 NOTE — Progress Notes (Signed)
   Subjective:    Patient ID: [name], female    DOB: [birth year], 80 y.o.   MRN: [id number]  HPI HTN- chronic problem, on Ramipril, metoprolol, Lasix.  No CP, SOB, HAs, visual changes, edema  Hyperlipidemia- chronic problem, on Pravastatin.  Denies abd pain, N/V, myalgias.  R lower leg wound- 2.5 cm on R lower leg, scabbed.  Minimal surrounding redness and mild edema.  Review of Systems For ROS see HPI     Objective:   Physical Exam  Vitals reviewed. Constitutional: She is oriented to person, place, and time. She appears well-developed and well-nourished. No distress.  HENT:  Head: Normocephalic and atraumatic.  Eyes: Conjunctivae and EOM are normal. Pupils are equal, round, and reactive to light.  Neck: Normal range of motion. Neck supple. No thyromegaly present.  Cardiovascular: Normal rate, regular rhythm, normal heart sounds and intact distal pulses.   No murmur heard. Pulmonary/Chest: Effort normal and breath sounds normal. No respiratory distress.  Abdominal: Soft. She exhibits no distension. There is no tenderness.  Musculoskeletal: She exhibits edema (trace R LE edema surrounding wound). She exhibits no tenderness.  Lymphadenopathy:    She has no cervical adenopathy.  Neurological: She is alert and oriented to person, place, and time.  Skin: Skin is warm and dry. There is erythema (faint redness surrounding 2.5 cm R LE scab on anterior shin).  Psychiatric: She has a normal mood and affect. Her behavior is normal.          Assessment & Plan:

## 2013-08-28 NOTE — Patient Instructions (Signed)
Schedule your complete physical in 3-4 months We'll notify you of your lab results and make any changes if needed Keep up the good work!  You look great! Call with any questions or concerns Enjoy the rest of your summer! Hang in there!!h

## 2013-08-28 NOTE — Assessment & Plan Note (Signed)
Much improved.  Pt is set to complete course of Doxy tomorrow.  Reviewed supportive care and red flags that should prompt return.  Pt expressed understanding and is in agreement w/ plan.

## 2013-08-28 NOTE — Assessment & Plan Note (Signed)
Chronic problem.  Tolerating statin w/o difficulty.  Check labs.  Adjust meds prn  

## 2013-08-28 NOTE — Assessment & Plan Note (Signed)
Chronic problem.  Well controlled today.  Asymptomatic.  Check labs.  No anticipated med changes. 

## 2013-08-28 NOTE — Progress Notes (Signed)
Pre visit review using our clinic review tool, if applicable. No additional management support is needed unless otherwise documented below in the visit note. 

## 2013-08-29 LAB — LIPID PANEL
CHOLESTEROL: 132 mg/dL (ref 0–200)
HDL: 45.6 mg/dL (ref >39.00)
LDL Cholesterol: 68 mg/dL (ref 0–99)
NonHDL: 86.4
TRIGLYCERIDES: 90 mg/dL (ref 0.0–149.0)
Total CHOL/HDL Ratio: 3
VLDL: 18 mg/dL (ref 0.0–40.0)

## 2013-08-29 LAB — BASIC METABOLIC PANEL
BUN: 34 mg/dL — ABNORMAL HIGH (ref 6–23)
CALCIUM: 10.2 mg/dL (ref 8.4–10.5)
CO2: 28 mEq/L (ref 19–32)
Chloride: 101 mEq/L (ref 96–112)
Creatinine, Ser: 1 mg/dL (ref 0.4–1.2)
GFR: 57.99 mL/min — ABNORMAL LOW (ref >60.00)
Glucose, Bld: 80 mg/dL (ref 70–99)
Potassium: 4.3 mEq/L (ref 3.5–5.1)
SODIUM: 137 meq/L (ref 135–145)

## 2013-08-29 LAB — HEPATIC FUNCTION PANEL
ALK PHOS: 85 U/L (ref 39–117)
ALT: 15 U/L (ref 0–35)
AST: 27 U/L (ref 0–37)
Albumin: 4.2 g/dL (ref 3.5–5.2)
Bilirubin, Direct: 0.1 mg/dL (ref 0.0–0.3)
TOTAL PROTEIN: 7 g/dL (ref 6.0–8.3)
Total Bilirubin: 0.6 mg/dL (ref 0.2–1.2)

## 2013-08-29 LAB — CBC WITH DIFFERENTIAL/PLATELET
BASOS ABS: 0 10*3/uL (ref 0.0–0.1)
Basophils Relative: 0.6 % (ref 0.0–3.0)
Eosinophils Absolute: 0.3 10*3/uL (ref 0.0–0.7)
Eosinophils Relative: 4.2 % (ref 0.0–5.0)
HCT: 40.9 % (ref 36.0–46.0)
Hemoglobin: 13.5 g/dL (ref 12.0–15.0)
LYMPHS PCT: 19.5 % (ref 12.0–46.0)
Lymphs Abs: 1.5 10*3/uL (ref 0.7–4.0)
MCHC: 32.9 g/dL (ref 30.0–36.0)
MCV: 92.7 fl (ref 78.0–100.0)
Monocytes Absolute: 0.6 10*3/uL (ref 0.1–1.0)
Monocytes Relative: 8.2 % (ref 3.0–12.0)
NEUTROS ABS: 5.1 10*3/uL (ref 1.4–7.7)
NEUTROS PCT: 67.5 % (ref 43.0–77.0)
PLATELETS: 219 10*3/uL (ref 150.0–400.0)
RBC: 4.41 Mil/uL (ref 3.87–5.11)
RDW: 13.8 % (ref 11.5–15.5)
WBC: 7.6 10*3/uL (ref 4.0–10.5)

## 2013-08-31 ENCOUNTER — Encounter: Payer: Self-pay | Admitting: General Practice

## 2013-08-31 ENCOUNTER — Telehealth: Payer: Self-pay | Admitting: Family Medicine

## 2013-08-31 NOTE — Telephone Encounter (Signed)
Relevant patient education assigned to patient using Emmi. ° °

## 2013-09-02 ENCOUNTER — Encounter: Payer: Self-pay | Admitting: Medical

## 2013-09-02 ENCOUNTER — Ambulatory Visit (HOSPITAL_BASED_OUTPATIENT_CLINIC_OR_DEPARTMENT_OTHER)
Admission: RE | Admit: 2013-09-02 | Discharge: 2013-09-02 | Disposition: A | Discharge status: Home / Self Care | Payer: Medicare Other | Source: Ambulatory Visit | Attending: Medical | Admitting: Medical

## 2013-09-02 ENCOUNTER — Ambulatory Visit (INDEPENDENT_AMBULATORY_CARE_PROVIDER_SITE_OTHER): Payer: Medicare Other | Admitting: Medical

## 2013-09-02 VITALS — BP 125/60 | HR 70 | Temp 98.0°F | Wt 128.0 lb

## 2013-09-02 DIAGNOSIS — S91309A Unspecified open wound, unspecified foot, initial encounter: Secondary | ICD-10-CM | POA: Diagnosis not present

## 2013-09-02 DIAGNOSIS — S9032XA Contusion of left foot, initial encounter: Secondary | ICD-10-CM

## 2013-09-02 DIAGNOSIS — S9030XA Contusion of unspecified foot, initial encounter: Secondary | ICD-10-CM | POA: Insufficient documentation

## 2013-09-02 DIAGNOSIS — I2589 Other forms of chronic ischemic heart disease: Secondary | ICD-10-CM | POA: Diagnosis not present

## 2013-09-02 DIAGNOSIS — Y9301 Activity, walking, marching and hiking: Secondary | ICD-10-CM | POA: Diagnosis not present

## 2013-09-02 DIAGNOSIS — IMO0002 Reserved for concepts with insufficient information to code with codable children: Secondary | ICD-10-CM

## 2013-09-02 DIAGNOSIS — M949 Disorder of cartilage, unspecified: Secondary | ICD-10-CM

## 2013-09-02 DIAGNOSIS — M899 Disorder of bone, unspecified: Secondary | ICD-10-CM | POA: Diagnosis not present

## 2013-09-02 DIAGNOSIS — Z9889 Other specified postprocedural states: Secondary | ICD-10-CM | POA: Insufficient documentation

## 2013-09-02 DIAGNOSIS — S90819A Abrasion, unspecified foot, initial encounter: Secondary | ICD-10-CM | POA: Insufficient documentation

## 2013-09-02 DIAGNOSIS — L089 Local infection of the skin and subcutaneous tissue, unspecified: Secondary | ICD-10-CM | POA: Diagnosis not present

## 2013-09-02 DIAGNOSIS — S90812S Abrasion, left foot, sequela: Secondary | ICD-10-CM

## 2013-09-02 DIAGNOSIS — Y92009 Unspecified place in unspecified non-institutional (private) residence as the place of occurrence of the external cause: Secondary | ICD-10-CM | POA: Diagnosis not present

## 2013-09-02 MED ORDER — DOXYCYCLINE HYCLATE 100 MG PO TABS: 100.0000 mg | ORAL_TABLET | Freq: Two times a day (BID) | ORAL | Status: DC

## 2013-09-02 NOTE — Patient Instructions (Signed)
We will get a xray of left foot due to nature of injury, prior surgery, swelling on exam and osteopenia history. For your abrasion you could use topical antibiotic. Some of skin may dry up and peel. In that event could trim with scissors on follow up. Follow up in 7 days or as needed.

## 2013-09-02 NOTE — Progress Notes (Signed)
Pre visit review using our clinic review tool, if applicable. No additional management support is needed unless otherwise documented below in the visit note. 

## 2013-09-02 NOTE — Assessment & Plan Note (Signed)
Still some redness and tenderness. I checked today while checking on new abrasion area left foot. Thought best to prescribe doxy again. Did advise again probiotic and if area looks better by 7 days could stop. But do come in for recheck in 7 days or as needed.

## 2013-09-02 NOTE — Progress Notes (Signed)
   Subjective:    Patient ID: [name], female    DOB: 1921/08/31, 80 y.o.   MRN: [id number]  HPI  Pt in for Abrasion injury that happened today. Pt was going thru doorway. Lay opened the door to help her and storm door hit her foot and scraped toe. Pt had hammer toe surgery in the past. She had surgery to rt foot months ago. Pt sprayed area with liquid neosporin.  Also pt was seen by me 15 days ago for rt pretibial laceration. Area is mild red around edges and mild tender still but better overall per pt.      Review of Systems  Constitutional: Negative for fever, chills and fatigue.  Respiratory: Negative for cough, choking and wheezing.   Cardiovascular: Negative for chest pain and palpitations.  Musculoskeletal:       Mild left foot pain. Also her right pretibial region around the scab is still low red and tender.  Skin:       Small abrasion of the left fourth toe. Also another abrasion area over the distal fourth metatarsal region.  Hematological: Negative for adenopathy. Does not bruise/bleed easily.       Objective:   Physical Exam  General-no acute distress, pleasant. Lungs-clear even and unlabored. Heart-regular rate and rhythm. Right lower extremity-she has a vertically oriented scab about 1 cm in length. Surrounding redness and warmth. Mild tender to palpation. No discharge. Left foot-4th toe top aspect 4 mm x 5 mm area. Base of 4th toe abrasion 5 mm x 10 mm.(the epidermis in these areas had been peeled off) Raw area present but no active bleeding. The area beneath the distal fourth metatarsal abrasion is a bit swollen.       Assessment & Plan:

## 2013-09-02 NOTE — Assessment & Plan Note (Signed)
Patient already clean the area prior to the visit. Medical assistant [name] did dress the wound. Patient was sent over for an x-ray of the foot and it showed no definite acute fracture. Patient can apply topical antibiotic to the area. Any worsening symptoms let us know.

## 2013-09-08 ENCOUNTER — Ambulatory Visit (INDEPENDENT_AMBULATORY_CARE_PROVIDER_SITE_OTHER): Payer: Medicare Other | Admitting: *Deleted

## 2013-09-08 ENCOUNTER — Telehealth: Payer: Self-pay | Admitting: Cardiology

## 2013-09-08 DIAGNOSIS — I2589 Other forms of chronic ischemic heart disease: Secondary | ICD-10-CM

## 2013-09-08 DIAGNOSIS — I255 Ischemic cardiomyopathy: Secondary | ICD-10-CM

## 2013-09-08 NOTE — Progress Notes (Signed)
Remote ICD transmission.   

## 2013-09-08 NOTE — Telephone Encounter (Signed)
LMOVM reminding pt to send remote transmission.   

## 2013-09-09 LAB — MDC_IDC_ENUM_SESS_TYPE_REMOTE
Battery Remaining Longevity: 23 mo
Battery Remaining Percentage: 39 %
Battery Voltage: 2.86 V
Brady Statistic AP VP Percent: 1 %
Brady Statistic AP VS Percent: 1 %
Brady Statistic AS VP Percent: 97 %
Brady Statistic AS VS Percent: 1.5 %
Brady Statistic RA Percent Paced: 1 %
Date Time Interrogation Session: 20150901191009
HighPow Impedance: 39 Ohm
Implantable Pulse Generator Serial Number: 815822
Lead Channel Impedance Value: 380 Ohm
Lead Channel Impedance Value: 390 Ohm
Lead Channel Impedance Value: 580 Ohm
Lead Channel Pacing Threshold Amplitude: 1 V
Lead Channel Pacing Threshold Amplitude: 1.5 V
Lead Channel Pacing Threshold Amplitude: 1.625 V
Lead Channel Pacing Threshold Pulse Width: 0.5 ms
Lead Channel Pacing Threshold Pulse Width: 0.5 ms
Lead Channel Pacing Threshold Pulse Width: 1 ms
Lead Channel Sensing Intrinsic Amplitude: 2.2 mV
Lead Channel Sensing Intrinsic Amplitude: 8.4 mV
Lead Channel Setting Pacing Amplitude: 2 V
Lead Channel Setting Pacing Amplitude: 2.625
Lead Channel Setting Pacing Amplitude: 2.75 V
Lead Channel Setting Pacing Pulse Width: 0.5 ms
Lead Channel Setting Pacing Pulse Width: 0.8 ms
Lead Channel Setting Sensing Sensitivity: 0.5 mV
Zone Setting Detection Interval: 270 ms
Zone Setting Detection Interval: 300 ms
Zone Setting Detection Interval: 365 ms

## 2013-09-11 ENCOUNTER — Ambulatory Visit (INDEPENDENT_AMBULATORY_CARE_PROVIDER_SITE_OTHER): Payer: Medicare Other | Admitting: Medical

## 2013-09-11 ENCOUNTER — Encounter: Payer: Self-pay | Admitting: Medical

## 2013-09-11 VITALS — BP 134/66 | HR 80 | Temp 97.8°F | Ht 60.0 in | Wt 128.0 lb

## 2013-09-11 DIAGNOSIS — I2589 Other forms of chronic ischemic heart disease: Secondary | ICD-10-CM

## 2013-09-11 DIAGNOSIS — R519 Headache, unspecified: Secondary | ICD-10-CM | POA: Insufficient documentation

## 2013-09-11 DIAGNOSIS — L089 Local infection of the skin and subcutaneous tissue, unspecified: Secondary | ICD-10-CM

## 2013-09-11 DIAGNOSIS — IMO0002 Reserved for concepts with insufficient information to code with codable children: Secondary | ICD-10-CM

## 2013-09-11 DIAGNOSIS — M542 Cervicalgia: Secondary | ICD-10-CM | POA: Diagnosis not present

## 2013-09-11 DIAGNOSIS — S90812S Abrasion, left foot, sequela: Secondary | ICD-10-CM

## 2013-09-11 DIAGNOSIS — R51 Headache: Secondary | ICD-10-CM | POA: Diagnosis not present

## 2013-09-11 DIAGNOSIS — T148XXS Other injury of unspecified body region, sequela: Secondary | ICD-10-CM

## 2013-09-11 NOTE — Progress Notes (Signed)
   Subjective:    Patient ID: [name], female    DOB: Oct 24, 1921, 80 y.o.   MRN: [id number]  HPI   Pt just finished her antibiotic and rt leg feels better and on discharge. Left foot feels better also. Abrasion healing. Scabs present no fever or chills. No diarrhea from antibiotic.   Pt has HA all the time but not severe. Ever since fall in [place].Negative CT. She went to neurologist the next day and appears addressed neck pain. I don't see that mri was done. Pt ha level 2/10. No associated neurologic signs or symptoms. She points to back of head as source of faint pain today. And states mild neck pain.   Review of Systems  Constitutional: Negative for fever, chills and fatigue.  Eyes: Negative for pain and redness.  Respiratory: Negative for cough, chest tightness and wheezing.   Cardiovascular: Negative for chest pain and palpitations.  Skin:       Areas on rt leg improved. Lt foot feels better as well per pt.  Neurological: Positive for headaches. Negative for dizziness, tremors, syncope, facial asymmetry, weakness, light-headedness and numbness.  Hematological: Negative for adenopathy. Does not bruise/bleed easily.  Psychiatric/Behavioral: Negative.        Objective:   Physical Exam  General- no acute distress, pleasant. Neck-full range of motion, no nuchal rigidity, no carotid bruits, no mid cervical spine tenderness. Some trapezius tenderness on both sides of the spine as trapezius inserts into the occipital region. Cranium-no obvious abnormality of the scalp. No tenderness to palpation. Lungs-clear even and unlabored Heart-regular rate and rhythm.  Right lower extremity-the vertically oriented wound has no surrounding redness and scab is formed. The wound looks smaller. There is no warmth or tenderness to touch. Left foot- Small abrasions look better. Small scab is present and there is no yellow discharge. No redness or warmth. Soft tissue is faintly bruised. Faint  tenderness to touch.(Prior x-ray showed no definite fracture)       Assessment & Plan:

## 2013-09-11 NOTE — Assessment & Plan Note (Signed)
This is technically not a new problem but she describes it as constant all day but very low-grade pain. No associated neurologic signs or symptoms. Her CT of the head was negative with Dr. Beverely Low. She saw the neurologist the next day regarding neck pain but I do not see that MRI was done. Patient does still report some neck pain and will therefore refer her back to neurologist to assess neck pain as well as persisting mild headaches. I did advise patient that if her headaches worsen or she has any neurologic signs or symptoms as discussed then go to the emergency department.

## 2013-09-11 NOTE — Assessment & Plan Note (Signed)
Foot abrasions look improved. No secondary signs or symptoms of infection. Also x-ray did not show fracture on the last visit.

## 2013-09-11 NOTE — Assessment & Plan Note (Signed)
The wound on her right lower extremity is healed and no further oral antibiotic needed.

## 2013-09-11 NOTE — Patient Instructions (Signed)
Your rt leg wound looks to have not infection now and needs no further antibiotics. Rt lt foot pain from abrasion looks to be healing well with no signs of infection. For your headaches I want to take low dose ibuprofen on occasion every 8 hrs prn HA. But not daily. Will review records of your neurologist visit. I may refer your back to them. If you get severe ha with neurologic symptoms then ED eval.

## 2013-09-17 ENCOUNTER — Encounter: Payer: Self-pay | Admitting: Cardiology

## 2013-09-24 ENCOUNTER — Encounter: Payer: Self-pay | Admitting: Internal Medicine

## 2013-10-01 ENCOUNTER — Telehealth: Payer: Self-pay | Admitting: General Practice

## 2013-10-01 MED ORDER — ALPRAZOLAM 0.25 MG PO TABS: 0.2500 mg | ORAL_TABLET | Freq: Two times a day (BID) | ORAL | Status: DC | PRN

## 2013-10-01 NOTE — Telephone Encounter (Signed)
Ok for #30, w/ 3

## 2013-10-01 NOTE — Telephone Encounter (Signed)
Last ov 08-28-13 Med filled 03-20-13 #30 with 3  Low risk

## 2013-10-01 NOTE — Telephone Encounter (Signed)
Med filled and faxed.  

## 2013-10-02 DIAGNOSIS — Z23 Encounter for immunization: Secondary | ICD-10-CM | POA: Diagnosis not present

## 2013-10-05 ENCOUNTER — Other Ambulatory Visit: Payer: Self-pay | Admitting: General Practice

## 2013-10-05 ENCOUNTER — Telehealth: Payer: Self-pay | Admitting: Family Medicine

## 2013-10-05 MED ORDER — ISOSORBIDE MONONITRATE ER 30 MG PO TB24: ORAL_TABLET | ORAL | Status: DC

## 2013-10-05 MED ORDER — RAMIPRIL 5 MG PO CAPS
5.0000 mg | ORAL_CAPSULE | Freq: Every day | ORAL | Status: DC
Start: 2013-10-05 — End: 2014-06-01

## 2013-10-05 NOTE — Telephone Encounter (Signed)
Caller name: Maris  Relation to pt: self  Call back number: [phone]  Pharmacy: [facility] [phone]   Reason for call: refill ramipril (ALTACE) 5 MG capsule and isosorbide mononitrate (IMDUR) 30 MG 24 hr tablet

## 2013-10-05 NOTE — Telephone Encounter (Signed)
Ok for 6 month refill on both

## 2013-10-05 NOTE — Telephone Encounter (Signed)
meds filled

## 2013-10-06 ENCOUNTER — Other Ambulatory Visit: Payer: Self-pay | Admitting: General Practice

## 2013-10-06 MED ORDER — NITROGLYCERIN 0.4 MG SL SUBL: 0.4000 mg | SUBLINGUAL_TABLET | SUBLINGUAL | Status: DC | PRN

## 2013-10-07 ENCOUNTER — Ambulatory Visit (INDEPENDENT_AMBULATORY_CARE_PROVIDER_SITE_OTHER): Payer: Medicare Other | Admitting: Family Medicine

## 2013-10-07 ENCOUNTER — Encounter: Payer: Self-pay | Admitting: Family Medicine

## 2013-10-07 VITALS — BP 140/80 | HR 57 | Temp 97.7°F | Resp 16 | Wt 129.1 lb

## 2013-10-07 DIAGNOSIS — F419 Anxiety disorder, unspecified: Secondary | ICD-10-CM

## 2013-10-07 DIAGNOSIS — M5489 Other dorsalgia: Secondary | ICD-10-CM

## 2013-10-07 DIAGNOSIS — M549 Dorsalgia, unspecified: Secondary | ICD-10-CM | POA: Diagnosis not present

## 2013-10-07 DIAGNOSIS — F411 Generalized anxiety disorder: Secondary | ICD-10-CM | POA: Diagnosis not present

## 2013-10-07 MED ORDER — METHYLPREDNISOLONE ACETATE 80 MG/ML IJ SUSP
80.0000 mg | Freq: Once | INTRAMUSCULAR | Status: AC
  Administered 2013-10-07: 80 mg via INTRAMUSCULAR

## 2013-10-07 NOTE — Progress Notes (Signed)
   Subjective:    Patient ID: Laurann MontanaDorothy G Roehrs, female    DOB: 1921-08-24, 80 y.o.   MRN: [id number]  Rash   Itching- pt has not ever tried OTC antihistamine.  Pt was unaware that depomedrol would help w/ itching.  Back pain- pt typically gets depo medrol injxn to avoid stiffness while flying.   Review of Systems  Skin: Positive for rash.   For ROS see HPI     Objective:   Physical Exam  Vitals reviewed. Constitutional: She is oriented to person, place, and time. She appears well-developed.  Thin, frail, elderly woman  Neurological: She is alert and oriented to person, place, and time.  Skin: Skin is warm and dry. No rash (no obvious hives) noted. No erythema.  Psychiatric: She has a normal mood and affect. Her behavior is normal.          Assessment & Plan:

## 2013-10-07 NOTE — Progress Notes (Signed)
Pre visit review using our clinic review tool, if applicable. No additional management support is needed unless otherwise documented below in the visit note. 

## 2013-10-07 NOTE — Assessment & Plan Note (Signed)
Deteriorated.  Pt is having stress induced hives.  None present on exam today.  Pt reports itching and rash improves w/ use of alprazolam prn.  Encouraged pt to find stress outlet.  She is looking forward to her upcoming trip.  Will follow.

## 2013-10-07 NOTE — Assessment & Plan Note (Signed)
Chronic problem.  Depo-medrol injxn given to pt as is customary for her before she travels.

## 2013-10-07 NOTE — Progress Notes (Signed)
   Subjective:    Patient ID: [name], female    DOB: 17-Oct-1921, 80 y.o.   MRN: [id number]  HPI Itching- pt has had a lot of family turmoil.  Step-grandson passed away, sister passed away, niece passed away.  Another niece has excessive credit card debt and asking pt for money.  + hives.  Back pain- pt has hx of this and pain worsens w/ flying.  Pt is leaving for [place] Friday. Review of Systems     Objective:   Physical Exam        Assessment & Plan:

## 2013-10-07 NOTE — Patient Instructions (Signed)
Please schedule your complete physical for December/Jan Consider Claritin or Zyrtec for hives/itching Call with any questions or concerns Have a wonderful trip!

## 2013-10-07 NOTE — Addendum Note (Signed)
Addended by: Sheliah HatchABORI, Angelos [place] E on: 10/07/2013 04:56 PM   Modules accepted: Level of Service

## 2013-10-23 ENCOUNTER — Other Ambulatory Visit: Payer: Self-pay | Admitting: General Practice

## 2013-10-23 MED ORDER — METOPROLOL SUCCINATE ER 50 MG PO TB24: 50.0000 mg | ORAL_TABLET | Freq: Two times a day (BID) | ORAL | Status: DC

## 2013-11-13 ENCOUNTER — Ambulatory Visit: Payer: Medicare Other | Admitting: Neurology

## 2013-11-24 ENCOUNTER — Other Ambulatory Visit: Payer: Self-pay | Admitting: General Practice

## 2013-11-24 MED ORDER — LEVOTHYROXINE SODIUM 100 MCG PO TABS: 100.0000 ug | ORAL_TABLET | Freq: Every day | ORAL | Status: DC

## 2013-12-05 ENCOUNTER — Other Ambulatory Visit: Payer: Self-pay | Admitting: Physician Assistant

## 2013-12-08 ENCOUNTER — Telehealth: Payer: Self-pay | Admitting: Neurology

## 2013-12-08 NOTE — Telephone Encounter (Signed)
Left message on machine for patient to call back. To make her aware if she would like to see Dr Tat for headaches we would need to make separate appt. Awaiting call back.

## 2013-12-08 NOTE — Telephone Encounter (Signed)
-----   Message from Octaviano Battyebecca S Tat, DO sent at 12/08/2013  1:46 PM EST ----- [name],  Pt coming for botox on Friday.  Saw in a September note that a PA she saw wanted me to address headache with her.  I can't do that on botox day (as I have meeting Friday afternoon) so if this is still an issue, she will need to make an appt separate from botox

## 2013-12-10 ENCOUNTER — Other Ambulatory Visit: Payer: Self-pay | Admitting: Neurology

## 2013-12-10 ENCOUNTER — Ambulatory Visit (INDEPENDENT_AMBULATORY_CARE_PROVIDER_SITE_OTHER): Payer: Medicare Other | Admitting: *Deleted

## 2013-12-10 DIAGNOSIS — G243 Spasmodic torticollis: Secondary | ICD-10-CM

## 2013-12-10 DIAGNOSIS — I5022 Chronic systolic (congestive) heart failure: Secondary | ICD-10-CM

## 2013-12-10 DIAGNOSIS — I255 Ischemic cardiomyopathy: Secondary | ICD-10-CM

## 2013-12-10 LAB — MDC_IDC_ENUM_SESS_TYPE_REMOTE
Brady Statistic AP VP Percent: 1 %
Brady Statistic AP VS Percent: 1 %
Brady Statistic AS VS Percent: 2 %
Brady Statistic RA Percent Paced: 1 %
HighPow Impedance: 39 Ohm
Lead Channel Impedance Value: 390 Ohm
Lead Channel Impedance Value: 580 Ohm
Lead Channel Pacing Threshold Amplitude: 1 V
Lead Channel Pacing Threshold Amplitude: 1.5 V
Lead Channel Pacing Threshold Amplitude: 1.625 V
Lead Channel Pacing Threshold Pulse Width: 0.5 ms
Lead Channel Sensing Intrinsic Amplitude: 7.3 mV
Lead Channel Setting Pacing Amplitude: 2 V
Lead Channel Setting Pacing Pulse Width: 0.8 ms
MDC IDC MSMT BATTERY REMAINING LONGEVITY: 20 mo
MDC IDC MSMT BATTERY REMAINING PERCENTAGE: 33 %
MDC IDC MSMT BATTERY VOLTAGE: 2.84 V
MDC IDC MSMT LEADCHNL LV PACING THRESHOLD PULSEWIDTH: 1 ms
MDC IDC MSMT LEADCHNL RA IMPEDANCE VALUE: 390 Ohm
MDC IDC MSMT LEADCHNL RA PACING THRESHOLD PULSEWIDTH: 0.5 ms
MDC IDC MSMT LEADCHNL RA SENSING INTR AMPL: 3 mV
MDC IDC PG SERIAL: 815822
MDC IDC SESS DTM: 20151203161805
MDC IDC SET LEADCHNL LV PACING AMPLITUDE: 2.75 V
MDC IDC SET LEADCHNL RV PACING AMPLITUDE: 2.625
MDC IDC SET LEADCHNL RV PACING PULSEWIDTH: 0.5 ms
MDC IDC SET LEADCHNL RV SENSING SENSITIVITY: 0.5 mV
MDC IDC SET ZONE DETECTION INTERVAL: 270 ms
MDC IDC SET ZONE DETECTION INTERVAL: 300 ms
MDC IDC STAT BRADY AS VP PERCENT: 97 %
Zone Setting Detection Interval: 365 ms

## 2013-12-11 ENCOUNTER — Ambulatory Visit: Payer: Medicare Other | Admitting: Neurology

## 2013-12-11 ENCOUNTER — Ambulatory Visit (INDEPENDENT_AMBULATORY_CARE_PROVIDER_SITE_OTHER): Payer: Medicare Other | Admitting: Neurology

## 2013-12-11 DIAGNOSIS — G243 Spasmodic torticollis: Secondary | ICD-10-CM | POA: Diagnosis not present

## 2013-12-11 MED ORDER — ONABOTULINUMTOXINA 100 UNITS IJ SOLR
100.0000 [IU] | Freq: Once | INTRAMUSCULAR | Status: AC
  Administered 2013-12-11: 100 [IU] via INTRAMUSCULAR

## 2013-12-11 NOTE — Procedures (Signed)
[facility]   Procedure Note Botox  Attending: Dr. Lurena Joinerebecca Aimee Timmons  Preoperative Diagnosis(es): Cervical Dystonia  Result History  Onset of effect: 5 d Duration of Benefit: still working, pleased with medication.   Adverse Effects: mild dysphagia last time  EXAM:  No head titubation.  L ear approximates L shoulder.  Face turned to the right  Consent obtained from: The patient Benefits discussed included, but were not limited to decreased muscle tightness, increased joint range of motion, and decreased pain.  Risk discussed included, but were not limited pain and discomfort, bleeding, bruising, excessive weakness, venous thrombosis, muscle atrophy and dysphagia.  A copy of the patient medication guide was given to the patient which explains the blackbox warning.  Patients identity and treatment sites confirmed yes.   Details of Procedure: Skin was cleaned with alcohol.  A 30 gauge, 1/2 inch needle was introduced to the target muscles. Prior to injection, the needle plunger was aspirated to make sure the needle was not within a blood vessel.  There was no blood retrieved on aspiration.    Following is a summary of the muscles injected  And the amount of Botulinum toxin used:   Dilution 0.9% preservative free saline mixed with 100 u Botox type A to make 10 U per 0.1cc  Injections  Location Left  Right Units Number of sites        Sternocleidomastoid 30+30  60   Splenius Capitus, posterior approach      Splenius Capitus, lateral approach  40    Levator Scapulae      Trapezius            TOTAL UNITS:   100    Agent: Botulinum Type A ( Onobotulinum Toxin type A ).  1 vials of Botox were used, each containing 100 units and freshly diluted with 1 mL of sterile, non-preserved saline   Total injected (Units): 100  Total wasted (Units): none wasted   Pt tolerated procedure well without complications.   Reinjection is anticipated in 3 months.  Clinical comment:  Asked pt today  about headache and back/neck pain but she states that these have resolved

## 2013-12-11 NOTE — Progress Notes (Signed)
Remote ICD transmission.   

## 2013-12-23 ENCOUNTER — Other Ambulatory Visit: Payer: Self-pay | Admitting: Physician Assistant

## 2013-12-28 ENCOUNTER — Encounter: Payer: Self-pay | Admitting: Family Medicine

## 2013-12-28 ENCOUNTER — Ambulatory Visit (INDEPENDENT_AMBULATORY_CARE_PROVIDER_SITE_OTHER): Payer: Medicare Other | Admitting: Family Medicine

## 2013-12-28 VITALS — BP 130/100 | HR 83 | Temp 97.5°F | Resp 17 | Wt 125.1 lb

## 2013-12-28 DIAGNOSIS — R5383 Other fatigue: Secondary | ICD-10-CM

## 2013-12-28 DIAGNOSIS — I2589 Other forms of chronic ischemic heart disease: Secondary | ICD-10-CM

## 2013-12-28 LAB — HEPATIC FUNCTION PANEL
ALK PHOS: 82 U/L (ref 39–117)
ALT: 13 U/L (ref 0–35)
AST: 22 U/L (ref 0–37)
Albumin: 4.5 g/dL (ref 3.5–5.2)
BILIRUBIN DIRECT: 0 mg/dL (ref 0.0–0.3)
TOTAL PROTEIN: 7.6 g/dL (ref 6.0–8.3)
Total Bilirubin: 0.5 mg/dL (ref 0.2–1.2)

## 2013-12-28 LAB — POCT URINALYSIS DIPSTICK
Bilirubin, UA: NEGATIVE
Glucose, UA: NEGATIVE
Ketones, UA: NEGATIVE
Leukocytes, UA: NEGATIVE
Nitrite, UA: NEGATIVE
PH UA: 6
PROTEIN UA: NEGATIVE
RBC UA: NEGATIVE
SPEC GRAV UA: 1.02
Urobilinogen, UA: 2

## 2013-12-28 LAB — BASIC METABOLIC PANEL
BUN: 13 mg/dL (ref 6–23)
CO2: 31 meq/L (ref 19–32)
Calcium: 10.5 mg/dL (ref 8.4–10.5)
Chloride: 102 mEq/L (ref 96–112)
Creatinine, Ser: 0.8 mg/dL (ref 0.4–1.2)
GFR: 77.04 mL/min (ref >60.00)
GLUCOSE: 102 mg/dL — AB (ref 70–99)
POTASSIUM: 4.2 meq/L (ref 3.5–5.1)
SODIUM: 141 meq/L (ref 135–145)

## 2013-12-28 LAB — CBC WITH DIFFERENTIAL/PLATELET
BASOS PCT: 0.6 % (ref 0.0–3.0)
Basophils Absolute: 0 10*3/uL (ref 0.0–0.1)
Eosinophils Absolute: 0.3 10*3/uL (ref 0.0–0.7)
Eosinophils Relative: 4.1 % (ref 0.0–5.0)
HCT: 43.2 % (ref 36.0–46.0)
Hemoglobin: 14.1 g/dL (ref 12.0–15.0)
LYMPHS PCT: 23.9 % (ref 12.0–46.0)
Lymphs Abs: 1.8 10*3/uL (ref 0.7–4.0)
MCHC: 32.7 g/dL (ref 30.0–36.0)
MCV: 92.6 fl (ref 78.0–100.0)
Monocytes Absolute: 0.8 10*3/uL (ref 0.1–1.0)
Monocytes Relative: 10.8 % (ref 3.0–12.0)
NEUTROS ABS: 4.6 10*3/uL (ref 1.4–7.7)
Neutrophils Relative %: 60.6 % (ref 43.0–77.0)
Platelets: 240 10*3/uL (ref 150.0–400.0)
RBC: 4.66 Mil/uL (ref 3.87–5.11)
RDW: 13.8 % (ref 11.5–15.5)
WBC: 7.6 10*3/uL (ref 4.0–10.5)

## 2013-12-28 NOTE — Progress Notes (Signed)
Pre visit review using our clinic review tool, if applicable. No additional management support is needed unless otherwise documented below in the visit note. 

## 2013-12-28 NOTE — Patient Instructions (Signed)
Follow up as needed Drink plenty of fluids REST! We'll notify you of your lab results and make any changes if needed Please call me if your symptoms change or worsen Call with any questions or concerns Happy Holidays!!!

## 2013-12-28 NOTE — Progress Notes (Signed)
   Subjective:    Patient ID: [name], female    DOB: December 10, 1921, 80 y.o.   MRN: [id number]  HPI Recurrent UTIs- pt has seen urology previously.  Pt tells me a story about an infection 20 yrs ago.  'i'm just tired now'.  No burning w/ urination, denies frequency.  No fevers.  Pt had chills on Saturday night.  No N/V/D.  + cough.  No sinus pain/pressure.  No ear pain.   Review of Systems For ROS see HPI     Objective:   Physical Exam  Constitutional: She is oriented to person, place, and time. She appears well-developed and well-nourished. No distress.  HENT:  Head: Normocephalic and atraumatic.  Neck: Normal range of motion. Neck supple.  Cardiovascular: Normal rate, regular rhythm, normal heart sounds and intact distal pulses.   Pulmonary/Chest: Effort normal and breath sounds normal. No respiratory distress. She has no wheezes. She has no rales.  Abdominal: Soft. Bowel sounds are normal. She exhibits no distension. There is no tenderness. There is no rebound.  Lymphadenopathy:    She has no cervical adenopathy.  Neurological: She is alert and oriented to person, place, and time.  Skin: Skin is warm and dry.  Psychiatric: She has a normal mood and affect. Her behavior is normal.  Thought process is tangential and difficult to follow  Vitals reviewed.         Assessment & Plan:

## 2013-12-29 LAB — TSH: TSH: 0.87 u[IU]/mL (ref 0.35–4.50)

## 2013-12-30 ENCOUNTER — Encounter: Payer: Self-pay | Admitting: General Practice

## 2014-01-03 NOTE — Assessment & Plan Note (Signed)
Recurrent problem for pt.  Unclear as to what associated sxs pt has today as she is alternating between recent events and something that occurred 20 yrs ago.  Pt reports feeling well currently.  Check urine to r/o UTI, labs to r/o anemia, thyroid abnormality, electrolyte imbalance.  Pt is well appearing in office today w/ normal PE.  Reviewed supportive care and red flags that should prompt return.  Pt expressed understanding and is in agreement w/ plan.

## 2014-01-11 DIAGNOSIS — I708 Atherosclerosis of other arteries: Secondary | ICD-10-CM | POA: Diagnosis not present

## 2014-01-15 ENCOUNTER — Other Ambulatory Visit: Payer: Self-pay | Admitting: General Practice

## 2014-01-15 MED ORDER — PRAVASTATIN SODIUM 20 MG PO TABS: ORAL_TABLET | ORAL | Status: DC

## 2014-01-22 ENCOUNTER — Encounter: Payer: Self-pay | Admitting: Cardiology

## 2014-01-27 ENCOUNTER — Encounter: Payer: Self-pay | Admitting: Internal Medicine

## 2014-02-01 ENCOUNTER — Encounter: Payer: Medicare Other | Admitting: Family Medicine

## 2014-02-01 ENCOUNTER — Telehealth: Payer: Self-pay | Admitting: *Deleted

## 2014-02-01 NOTE — Telephone Encounter (Signed)
Pt cancelled appointment 02/01/14 at 1:30pm for CPE/fasting, rescheduled for 02/18/14.  Pt will not be charged no show fee due to weather.

## 2014-02-09 ENCOUNTER — Ambulatory Visit (INDEPENDENT_AMBULATORY_CARE_PROVIDER_SITE_OTHER): Payer: Medicare Other | Admitting: Internal Medicine

## 2014-02-09 ENCOUNTER — Encounter: Payer: Self-pay | Admitting: Internal Medicine

## 2014-02-09 VITALS — BP 142/60 | HR 84 | Ht 59.75 in | Wt 131.1 lb

## 2014-02-09 DIAGNOSIS — K219 Gastro-esophageal reflux disease without esophagitis: Secondary | ICD-10-CM | POA: Diagnosis not present

## 2014-02-09 DIAGNOSIS — K222 Esophageal obstruction: Secondary | ICD-10-CM | POA: Diagnosis not present

## 2014-02-09 MED ORDER — RANITIDINE HCL 150 MG PO TABS: 150.0000 mg | ORAL_TABLET | Freq: Every day | ORAL | Status: DC

## 2014-02-09 MED ORDER — RANITIDINE HCL 150 MG PO TABS: 150.0000 mg | ORAL_TABLET | Freq: Two times a day (BID) | ORAL | Status: DC

## 2014-02-09 MED ORDER — HYDROCORTISONE 1 % EX OINT: TOPICAL_OINTMENT | CUTANEOUS | Status: DC

## 2014-02-09 NOTE — Progress Notes (Signed)
[name] Aug 19, 1921 [id number]  Note: This dictation was prepared with Dragon digital system. Any transcriptional errors that result from this procedure are unintentional.   History of Present Illness: This is a 81 year old white female with benign distal esophageal stricture which was last time dilated in November 2010. She has a 3 cm hiatal hernia , not taking any acid reducing medications. She had prior dilatations in 1994 when she presented with food impaction. And subsequently in 1996, 2004, 2006 in 2007. Her dysphagia is  very mild and does not occur every day. It occurs to liquids as well as solids. There has been no food impaction. She takes buttermilk for heartburn. Last colonoscopy October 2004 shows severe diverticulosis and 8 mm left colon polyp which was removed but not retrieved. She has ischemic cardiomyopathy. Ejection fraction 25-30% improved to 45%    Past Medical History  Diagnosis Date  . Chest pain 12/09    recent hospitalization for it. felt to be noncardiac.   [name] CAD (coronary artery disease)     a. s/p prior anterior wall myocardial infarction and multiple percutaneous coronary intervention.;  b. Aden MV 1/14:  low risk, inf and apical defect likely due to atten artifact, no ischemia, EF 35% (visually normal and est 55-60%)  . Ischemic cardiomyopathy     EF 25-30% improved to 45% with biventricular pacing.   . Systolic heart failure     improved to class I to II   . Automatic implantable cardioverter/defibrillator (AICD) activation     s/p implantable cardioverter-defibrillator bivenrticular pacer. 12/05. St, Jude Medtronic. remote-no.  [name] HTN (hypertension)   . Hyperlipidemia   . GERD (gastroesophageal reflux disease)   . Esophageal stricture     last dialated 2007  . Hiatal hernia   . DD (diverticular disease)     Hx of it - severe. Left colon 2004  . Other urinary problems     urinary incontenance  . UTI (lower urinary tract infection)     recurrent.  (Klebsiella pneumoniae). Last Cx 02/03/09. (R-ancef, nitrofurantoin, Augmentin, Zyosyn)  . Arthritis   . PVT (paroxysmal ventricular tachycardia)   . CHF (congestive heart failure)   . Hypothyroidism   . Anxiety     sleep  . Pacemaker   . Automatic implantable cardioverter-defibrillator in situ   . Heart murmur   . History of recurrent UTIs   . Shortness of breath     with exertion  . Urgency of urination   . Frequent urination at night     Past Surgical History  Procedure Laterality Date  . Angioplasty      stent  . Unspecified area hysterectomy    . Tubal ligation    . Appendectomy    . Cardiac defibrillator placement      [facility]  . Cataract extraction, bilateral    . Total knee arthroplasty      bilateral  . Tonsillectomy    . Hammertoe reconstruction with weil osteotomy Right 07/17/2012    Procedure: RIGHT SECOND AND THIRD MT WEIL OSTEOTOMIES AND SECOND AND THIRD HAMMERTOE CORRECTIONS;  Surgeon: Toni Arthurs, MD;  Location: [facility] OR;  Service: Orthopedics;  Laterality: Right;  . Colonoscopy w/ polypectomy    . Cardiac catheterization      stent  . Insert / replace / remove pacemaker    . Hammer toe surgery Left 01/15/2013    Procedure: LEFT SECOND THROUGH FOURTH HAMMERTOE CORRECTION ;  Surgeon: Toni Arthurs, MD;  Location: [facility] OR;  Service: Orthopedics;  Laterality: Left;  . Weil osteotomy Left 01/15/2013    Procedure:  WEIL OSTEOTOMY AND DORSAL CAPSULOTOMY;  Surgeon: Toni ArthursJohn Hewitt, MD;  Location: [facility] OR;  Service: Orthopedics;  Laterality: Left;    Allergies  Allergen Reactions  . Sulfonamide Derivatives     REACTION: rash and itching    Family history and social history have been reviewed.  Review of Systems: Gradual weight loss. Occasional rectal irritation, denies rectal bleeding  The remainder of the 10 point ROS is negative except as outlined in the H&P  Physical Exam: General Appearance thin in no distress walks with a walker Eyes  Non icteric  HEENT  Non  traumatic, normocephalic  Mouth No lesion, tongue papillated, no cheilosis Neck Supple without adenopathy, thyroid not enlarged, no carotid bruits, no JVD Lungs Clear to auscultation bilaterally mild kyphosis COR Normal S1, normal S2, regular rhythm, frequent premature beats no murmur, quiet precordium Abdomen soft, scaphoid. Nontender with normoactive bowel sounds Rectal external hemorrhoidal tags. Normal rectal sphincter tone. Small amount of Hemoccult negative stool Extremities  No pedal edema Skin No lesions Neurological Alert and oriented x 3 Psychological Normal mood and affect  Assessment and Plan:   81 year old white female with the benign distal esophageal stricture last him dilated  5 years ago. She has heart disease and is not a suitable candidate for esophageal dilation. We will put her back on the ranitidine 150 mg daily. I have discussed antireflux measures. I also discussed avoiding certain foods such as meats and eating in upright position.Consider Barium esophagram.  Colo rectal screening, last colon 2004. She has symptomatic internal hemorrhoids. We've prescribed 1% hydrocortisone cream when necessary    Lina SarDora Brodie @TODAY (<PARAMETER> error)@

## 2014-02-09 NOTE — Patient Instructions (Addendum)
We have sent the following medications to your pharmacy for you to pick up at your convenience: Zantac and hydrocortisone rectal cream   I appreciate the opportunity to care for you. Dr. Neena RhymesKatherine Tabori

## 2014-02-17 ENCOUNTER — Telehealth: Payer: Self-pay | Admitting: *Deleted

## 2014-02-17 ENCOUNTER — Encounter: Payer: Self-pay | Admitting: *Deleted

## 2014-02-17 NOTE — Telephone Encounter (Signed)
Pre-Visit Call: spoke with granddaughter ([place]  Reviewed allergies, medications, health history, and health maintenance with patient and made changes as appropriate.   Preferred pharmacy:  [id number] - [place], [place] - [address]  CCS- granddaughter reports she had this 3/4 years ago, and is still seeing Dr. Julio Almora Brody -[facility]- 08/23/10- [facility] with Lina Sarora Brodie- BI RADS CATEGORY 1: Neg BD- 04/21/12 with Dala Dockoretta Smith at [facility]- Osteoporosis, recommended f/u in 2 yrs  Flu- 09/08/13 UTD Td- 08/05/13 UTD Pnm- unknown Zoster- unknown  Concerns: Granddaughter reports that the patient is concerned about her energy level.  Granddaughter feels that she is still fairly active especially considering her age.  The patient frequently complains of back pain.  Otherwise she says they "Just want to make sure everything is moving right."

## 2014-02-18 ENCOUNTER — Ambulatory Visit (INDEPENDENT_AMBULATORY_CARE_PROVIDER_SITE_OTHER): Payer: Medicare Other | Admitting: Family Medicine

## 2014-02-18 ENCOUNTER — Encounter: Payer: Self-pay | Admitting: Family Medicine

## 2014-02-18 ENCOUNTER — Encounter: Payer: Self-pay | Admitting: General Practice

## 2014-02-18 ENCOUNTER — Other Ambulatory Visit: Payer: Self-pay | Admitting: General Practice

## 2014-02-18 VITALS — BP 140/70 | HR 78 | Temp 97.9°F | Resp 17 | Ht 59.75 in | Wt 129.4 lb

## 2014-02-18 DIAGNOSIS — Z23 Encounter for immunization: Secondary | ICD-10-CM | POA: Diagnosis not present

## 2014-02-18 DIAGNOSIS — E785 Hyperlipidemia, unspecified: Secondary | ICD-10-CM | POA: Diagnosis not present

## 2014-02-18 DIAGNOSIS — M810 Age-related osteoporosis without current pathological fracture: Secondary | ICD-10-CM | POA: Diagnosis not present

## 2014-02-18 DIAGNOSIS — Z1231 Encounter for screening mammogram for malignant neoplasm of breast: Secondary | ICD-10-CM | POA: Diagnosis not present

## 2014-02-18 DIAGNOSIS — I10 Essential (primary) hypertension: Secondary | ICD-10-CM

## 2014-02-18 DIAGNOSIS — E038 Other specified hypothyroidism: Secondary | ICD-10-CM

## 2014-02-18 DIAGNOSIS — Z0000 Encounter for general adult medical examination without abnormal findings: Secondary | ICD-10-CM | POA: Diagnosis not present

## 2014-02-18 LAB — HEPATIC FUNCTION PANEL
ALT: 16 U/L (ref 0–35)
AST: 24 U/L (ref 0–37)
Albumin: 4.3 g/dL (ref 3.5–5.2)
Alkaline Phosphatase: 85 U/L (ref 39–117)
BILIRUBIN DIRECT: 0.2 mg/dL (ref 0.0–0.3)
BILIRUBIN TOTAL: 0.8 mg/dL (ref 0.2–1.2)
Total Protein: 7 g/dL (ref 6.0–8.3)

## 2014-02-18 LAB — LIPID PANEL
Cholesterol: 120 mg/dL (ref 0–200)
HDL: 43 mg/dL (ref >39.00)
LDL Cholesterol: 62 mg/dL (ref 0–99)
NonHDL: 77
Total CHOL/HDL Ratio: 3
Triglycerides: 75 mg/dL (ref 0.0–149.0)
VLDL: 15 mg/dL (ref 0.0–40.0)

## 2014-02-18 LAB — CBC WITH DIFFERENTIAL/PLATELET
Basophils Absolute: 0.1 10*3/uL (ref 0.0–0.1)
Basophils Relative: 0.9 % (ref 0.0–3.0)
Eosinophils Absolute: 0.4 10*3/uL (ref 0.0–0.7)
Eosinophils Relative: 5.2 % — ABNORMAL HIGH (ref 0.0–5.0)
HEMATOCRIT: 40.9 % (ref 36.0–46.0)
HEMOGLOBIN: 13.8 g/dL (ref 12.0–15.0)
LYMPHS ABS: 1.8 10*3/uL (ref 0.7–4.0)
LYMPHS PCT: 25.3 % (ref 12.0–46.0)
MCHC: 33.7 g/dL (ref 30.0–36.0)
MCV: 90.9 fl (ref 78.0–100.0)
MONOS PCT: 10.4 % (ref 3.0–12.0)
Monocytes Absolute: 0.7 10*3/uL (ref 0.1–1.0)
Neutro Abs: 4.2 10*3/uL (ref 1.4–7.7)
Neutrophils Relative %: 58.2 % (ref 43.0–77.0)
PLATELETS: 198 10*3/uL (ref 150.0–400.0)
RBC: 4.5 Mil/uL (ref 3.87–5.11)
RDW: 13.7 % (ref 11.5–15.5)
WBC: 7.2 10*3/uL (ref 4.0–10.5)

## 2014-02-18 LAB — BASIC METABOLIC PANEL
BUN: 16 mg/dL (ref 6–23)
CHLORIDE: 108 meq/L (ref 96–112)
CO2: 29 meq/L (ref 19–32)
Calcium: 10.3 mg/dL (ref 8.4–10.5)
Creatinine, Ser: 0.63 mg/dL (ref 0.40–1.20)
GFR: 94.19 mL/min (ref >60.00)
Glucose, Bld: 96 mg/dL (ref 70–99)
Potassium: 4.8 mEq/L (ref 3.5–5.1)
SODIUM: 142 meq/L (ref 135–145)

## 2014-02-18 LAB — VITAMIN D 25 HYDROXY (VIT D DEFICIENCY, FRACTURES): VITD: 42.98 ng/mL (ref 30.00–100.00)

## 2014-02-18 LAB — TSH: TSH: 1.07 u[IU]/mL (ref 0.35–4.50)

## 2014-02-18 MED ORDER — LEVOTHYROXINE SODIUM 100 MCG PO TABS: 100.0000 ug | ORAL_TABLET | Freq: Every day | ORAL | Status: DC

## 2014-02-18 NOTE — Progress Notes (Signed)
   Subjective:    Patient ID: Tracy MontanaDorothy G Bialecki, female    DOB: Dec 10, 1921, 81 y.o.   MRN: [id number]  HPI Here today for CPE.  Risk Factors: HTN- chronic problem, on Ramipril, Lasix, metoprolol.  Denies CP, SOB, HAs, visual changes, edema.  Excellent medication compliance. Hyperlipidemia- chronic problem, on Pravastatin.  Denies abd pain, N/V, myalgias.  Excellent medication compliance. Hypothyroid- chronic problem, on Levothyroxine.  + fatigue.  Denies changes to skin/hair/nails.  Excellent medication compliance.  Physical Activity: exercising regularly despite back pain Fall Risk: elevated due to use of walker/cervical dystonia and back pain Depression: denies current sxs Hearing: decreased to conversational tones and whispered voice- wears hearing aides ADL's: independent but pt doesn't drive Cognitive: normal linear thought process, memory and attention intact Home Safety: safe at home, has her dog.  Strong support system.  considering assisted living at [facility] Height, Weight, BMI, Visual Acuity: see vitals, vision corrected to 20/20 w/ glasses Counseling: UTD on colonoscopy, due for mammo and DEXA. Labs Ordered: See A&P Care Plan: See A&P    Review of Systems Patient reports no vision/ hearing changes, adenopathy,fever, weight change,  persistant/recurrent hoarseness , swallowing issues, chest pain, palpitations, edema, persistant/recurrent cough, hemoptysis, dyspnea (rest/exertional/paroxysmal nocturnal), gastrointestinal bleeding (melena, rectal bleeding), abdominal pain, significant heartburn, bowel changes, GU symptoms (dysuria, hematuria, incontinence), Gyn symptoms (abnormal  bleeding, pain),  syncope, focal weakness, memory loss, numbness & tingling, skin/hair/nail changes, abnormal bruising or bleeding, anxiety, or depression.   Reviewed meds, allergies, problem list, and PMH in chart     Objective:   Physical Exam General Appearance:    Alert, cooperative, no  distress, appears stated age  Head:    Normocephalic, without obvious abnormality, atraumatic  Eyes:    PERRL, conjunctiva/corneas clear, EOM's intact, fundi    benign, both eyes  Ears:    Normal TM's and external ear canals, both ears  Nose:   Nares normal, septum midline, mucosa normal, no drainage    or sinus tenderness  Throat:   Lips, mucosa, and tongue normal; teeth and gums normal  Neck:   Supple, symmetrical, trachea midline, no adenopathy;    Thyroid: no enlargement/tenderness/nodules  Back:     Symmetric, no curvature, ROM normal, no CVA tenderness  Lungs:     Clear to auscultation bilaterally, respirations unlabored  Chest Wall:    No tenderness or deformity   Heart:    Regular rate and rhythm, S1 and S2 normal, no murmur, rub   or gallop  Breast Exam:    Deferred to GYN  Abdomen:     Soft, non-tender, bowel sounds active all four quadrants,    no masses, no organomegaly  Genitalia:    Deferred to GYN  Rectal:    Extremities:   Extremities normal, atraumatic, no cyanosis or edema  Pulses:   2+ and symmetric all extremities  Skin:   Skin color, texture, turgor normal, no rashes or lesions  Lymph nodes:   Cervical, supraclavicular, and axillary nodes normal  Neuro:        Normal linear thought process, speech WNL          Assessment & Plan:

## 2014-02-18 NOTE — Patient Instructions (Signed)
Follow up in 6 months to recheck BP and cholesterol  We'll notify you of your lab results and make any changes if needed We'll call you with your mammo and bone density appts Keep up the good work!  You look great! Call with any questions or concerns Happy Valentine's Day!

## 2014-02-18 NOTE — Progress Notes (Signed)
Pre visit review using our clinic review tool, if applicable. No additional management support is needed unless otherwise documented below in the visit note. 

## 2014-02-19 ENCOUNTER — Other Ambulatory Visit: Payer: Self-pay | Admitting: General Practice

## 2014-02-19 DIAGNOSIS — H04123 Dry eye syndrome of bilateral lacrimal glands: Secondary | ICD-10-CM | POA: Diagnosis not present

## 2014-02-19 MED ORDER — METOPROLOL SUCCINATE ER 50 MG PO TB24: 50.0000 mg | ORAL_TABLET | Freq: Two times a day (BID) | ORAL | Status: DC

## 2014-02-21 NOTE — Assessment & Plan Note (Signed)
Chronic problem.  Pt reports fatigue but suspect this is due to her activity level at 81 yrs old.  Check labs.  Adjust meds prn

## 2014-02-21 NOTE — Assessment & Plan Note (Signed)
Chronic problem.  Due for DEXA- order entered.  Check Vit D level- replete prn.

## 2014-02-21 NOTE — Assessment & Plan Note (Signed)
Chronic problem.  Adequate control.  Asymptomatic.  Check labs.  No anticipated med changes 

## 2014-02-21 NOTE — Assessment & Plan Note (Signed)
Chronic problem.  Pt tolerating statin w/o difficulty.  Check labs.  Adjust meds prn  

## 2014-02-21 NOTE — Assessment & Plan Note (Signed)
Pt's PE unchanged from previous.  UTD on colonoscopy.  Due for mammo, DEXA- order entered.  No need for pap.  Written screening schedule updated and given to pt.  Check labs.  Anticipatory guidance provided.

## 2014-02-25 DIAGNOSIS — I708 Atherosclerosis of other arteries: Secondary | ICD-10-CM | POA: Diagnosis not present

## 2014-03-04 DIAGNOSIS — N3000 Acute cystitis without hematuria: Secondary | ICD-10-CM | POA: Diagnosis not present

## 2014-03-04 DIAGNOSIS — I708 Atherosclerosis of other arteries: Secondary | ICD-10-CM | POA: Diagnosis not present

## 2014-03-26 ENCOUNTER — Ambulatory Visit: Payer: Medicare Other | Admitting: Neurology

## 2014-03-30 ENCOUNTER — Ambulatory Visit (INDEPENDENT_AMBULATORY_CARE_PROVIDER_SITE_OTHER): Payer: Medicare Other | Admitting: Family Medicine

## 2014-03-30 ENCOUNTER — Encounter: Payer: Self-pay | Admitting: Family Medicine

## 2014-03-30 VITALS — BP 122/68 | HR 87 | Temp 98.5°F | Wt 125.0 lb

## 2014-03-30 DIAGNOSIS — G44329 Chronic post-traumatic headache, not intractable: Secondary | ICD-10-CM
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
## 2014-03-30 NOTE — Patient Instructions (Signed)

## 2014-03-30 NOTE — Progress Notes (Signed)
Pre visit review using our clinic review tool, if applicable. No additional management support is needed unless otherwise documented below in the visit note. 

## 2014-04-06 ENCOUNTER — Telehealth: Payer: Self-pay | Admitting: Neurology

## 2014-04-06 NOTE — Telephone Encounter (Signed)
Called patient and verified that she did not think apt on 04/12/2014 was for Botox. Patient states that she just wants to come in for a follow up to discuss symptoms/botox and knows that she will not receive Botox injection on this day.

## 2014-04-12 ENCOUNTER — Ambulatory Visit (INDEPENDENT_AMBULATORY_CARE_PROVIDER_SITE_OTHER): Payer: Medicare Other | Admitting: Neurology

## 2014-04-12 ENCOUNTER — Encounter: Payer: Self-pay | Admitting: Neurology

## 2014-04-12 VITALS — BP 104/60 | HR 76 | Ht 59.0 in | Wt 131.0 lb

## 2014-04-12 DIAGNOSIS — R0602 Shortness of breath: Secondary | ICD-10-CM

## 2014-04-12 DIAGNOSIS — R002 Palpitations: Secondary | ICD-10-CM | POA: Diagnosis not present

## 2014-04-12 DIAGNOSIS — R51 Headache: Secondary | ICD-10-CM | POA: Diagnosis not present

## 2014-04-12 DIAGNOSIS — R519 Headache, unspecified: Secondary | ICD-10-CM

## 2014-04-12 DIAGNOSIS — R413 Other amnesia: Secondary | ICD-10-CM

## 2014-04-12 NOTE — Progress Notes (Signed)
[name] was seen today in neurologic follow up.  She has a h/o cervical dystonia.  She cx her 03/26/14 botox and last had botox on 12/11/13.  Her botox tx's have been complicated by dysphagia and therefore she gets very, very low dosage.  States that she had some more dysphagia with last injection, but the head tremor is not getting much worse even though she is overdue for the Botox.  The records that were made available to me were reviewed since last visit.  Been c/o headache.  States that they are nighttime headaches and not during the day.  They feel like they are "in the bone."  She cannot describe the quality at all to me.  It is bilateral temporal and at the vertex.  It comes and goes.  It can awaken her from sleep.  It doesn't make her eyes water.  It doesn't change her speech.  She isn't taking anything for the headache and states that she doesn't really need to.  It may or may not be present in the AM when she awakens.  It is not present when she first lays down at night.  She states that it is not bad enough that she would want medication for it.  She is noticing more shortness of breath.  She is having some palpitations.  She has an appointment with her cardiologist in a few weeks.  She admits that she does have some stress going on with her family, particularly with her son.   Neuroimaging has previously been performed.  It is available for my review today.  CT brain done 08/20/13 and demonstrated atrophy and mod small vessel disease.    ALLERGIES:   Allergies  Allergen Reactions  . Sulfonamide Derivatives     REACTION: rash and itching    CURRENT MEDICATIONS:  Outpatient Encounter Prescriptions as of 04/12/2014  Medication Sig  . aspirin (ASPIR-81) 81 MG EC tablet Take 81 mg by mouth daily.    . furosemide (LASIX) 20 MG tablet Take 1 tablet (20 mg total) by mouth daily as needed for fluid or edema.  . hydrocortisone 1 % ointment Apply rectally twice a day as needed for  rectal irritation  . isosorbide mononitrate (IMDUR) 30 MG 24 hr tablet TAKE 1 AND 1/2 TABLETS EVERY MORNING  . levothyroxine (SYNTHROID, LEVOTHROID) 100 MCG tablet Take 1 tablet (100 mcg total) by mouth daily.  . metoprolol succinate (TOPROL-XL) 50 MG 24 hr tablet Take 1 tablet (50 mg total) by mouth 2 (two) times daily. Take with or immediately following a meal.  . pravastatin (PRAVACHOL) 20 MG tablet TAKE 1 TABLET (20 MG TOTAL) BY MOUTH AT BEDTIME.  [name] Psyllium (METAFIBER) 48.57 % POWD Take 10 mLs by mouth every evening.   . ramipril (ALTACE) 5 MG capsule Take 1 capsule (5 mg total) by mouth daily.  . ranitidine (ZANTAC) 150 MG tablet Take 1 tablet (150 mg total) by mouth daily.  . nitroGLYCERIN (NITROSTAT) 0.4 MG SL tablet Place 1 tablet (0.4 mg total) under the tongue every 5 (five) minutes as needed. At the onset of chest pain, Up to 3 doses (Patient not taking: Reported on 04/12/2014)    PAST MEDICAL HISTORY:   Past Medical History  Diagnosis Date  . Chest pain 12/09    recent hospitalization for it. felt to be noncardiac.   [name] CAD (coronary artery disease)     a. s/p prior anterior wall myocardial infarction and multiple percutaneous coronary intervention.;  b. Aden MV 1/14:  low risk, inf and apical defect likely due to atten artifact, no ischemia, EF 35% (visually normal and est 55-60%)  . Ischemic cardiomyopathy     EF 25-30% improved to 45% with biventricular pacing.   . Systolic heart failure     improved to class I to II   . Automatic implantable cardioverter/defibrillator (AICD) activation     s/p implantable cardioverter-defibrillator bivenrticular pacer. 12/05. St, Jude Medtronic. remote-no.  [name] HTN (hypertension)   . Hyperlipidemia   . GERD (gastroesophageal reflux disease)   . Esophageal stricture     last dialated 2007  . Hiatal hernia   . DD (diverticular disease)     Hx of it - severe. Left colon 2004  . Other urinary problems     urinary incontenance  . UTI (lower  urinary tract infection)     recurrent. (Klebsiella pneumoniae). Last Cx 02/03/09. (R-ancef, nitrofurantoin, Augmentin, Zyosyn)  . Arthritis   . PVT (paroxysmal ventricular tachycardia)   . CHF (congestive heart failure)   . Hypothyroidism   . Anxiety     sleep  . Pacemaker   . Automatic implantable cardioverter-defibrillator in situ   . Heart murmur   . History of recurrent UTIs   . Shortness of breath     with exertion  . Urgency of urination   . Frequent urination at night     PAST SURGICAL HISTORY:   Past Surgical History  Procedure Laterality Date  . Angioplasty      stent  . Unspecified area hysterectomy    . Tubal ligation    . Appendectomy    . Cardiac defibrillator placement      [facility]  . Cataract extraction, bilateral    . Total knee arthroplasty      bilateral  . Tonsillectomy    . Hammertoe reconstruction with weil osteotomy Right 07/17/2012    Procedure: RIGHT SECOND AND THIRD MT WEIL OSTEOTOMIES AND SECOND AND THIRD HAMMERTOE CORRECTIONS;  Surgeon: Toni Arthurs, MD;  Location: [facility] OR;  Service: Orthopedics;  Laterality: Right;  . Colonoscopy w/ polypectomy    . Cardiac catheterization      stent  . Insert / replace / remove pacemaker    . Hammer toe surgery Left 01/15/2013    Procedure: LEFT SECOND THROUGH FOURTH HAMMERTOE CORRECTION ;  Surgeon: Toni Arthurs, MD;  Location: [facility] OR;  Service: Orthopedics;  Laterality: Left;  . Weil osteotomy Left 01/15/2013    Procedure:  WEIL OSTEOTOMY AND DORSAL CAPSULOTOMY;  Surgeon: Toni Arthurs, MD;  Location: [facility] OR;  Service: Orthopedics;  Laterality: Left;    SOCIAL HISTORY:   History   Social History  . Marital Status: Widowed    Spouse Name: N/A  . Number of Children: N/A  . Years of Education: N/A   Occupational History  . Not on file.   Social History Main Topics  . Smoking status: Never Smoker   . Smokeless tobacco: Never Used  . Alcohol Use: No  . Drug Use: No  . Sexual Activity: Not on file    Other Topics Concern  . Not on file   Social History Narrative   Widowed, husband died of brain tumor. Son lives in [place], [place]- was in a MVA for DWI. He has had a long hx of alcohol problems, she has helped him out financially in the past but is ready to be much stricter. Pt. Has a daughter who live in [place] with Breast Ca.  She also lost a son to a brain tumor.     FAMILY HISTORY:   Family Status  Relation Status Death Age  . Mother Deceased     MI  . Father Deceased     MI  . Sister Deceased     2, CAD  . Brother Deceased     3, trauma, CAD, CA  . Sister Alive     2,   . Brother Alive     2  . Child Deceased     1, CA (lung)  . Child Alive     3, Rh Factor, Breast CA    ROS:  A complete 10 system review of systems was obtained and was unremarkable apart from what is mentioned above.  PHYSICAL EXAMINATION:    VITALS:   Filed Vitals:   04/12/14 1448  BP: 104/60  Pulse: 76  Height:  (1.499 m)  Weight: 131 lb (59.421 kg)    GEN:  Normal appears female in no acute distress.  Appears stated age. HEENT:  Normocephalic, atraumatic. The mucous membranes are moist. The superficial temporal arteries are without ropiness or tenderness.  There are no superficial temporal artery bruits. Cardiovascular: Regular rate and rhythm. Lungs: Clear to auscultation bilaterally. Neck/Heme: There are no carotid bruits noted bilaterally.  NEUROLOGICAL: Orientation:  The patient is alert and oriented x 3.   Cranial nerves: There is good facial symmetry.  Extraocular muscles are intact and visual fields are full to confrontational testing. Speech is fluent and clear. Soft palate rises symmetrically and there is no tongue deviation. Hearing is intact to conversational tone. Tone: Tone is good throughout. Sensation: Sensation is intact to light touch throughout. Coordination:  The patient has no difficulty with RAM's or FNF bilaterally. Motor: Strength is 5/5 in the bilateral upper and  lower extremities.  Shoulder shrug is equal and symmetric. There is no pronator drift.  There are no fasciculations noted. Gait and Station: The patient is able to ambulate without difficulty.  She does ambulate with her walker.  No results found for: Karlton Lemon, POCTSEDRATE  Lab Results  Component Value Date   WBC 7.2 02/18/2014   HGB 13.8 02/18/2014   HCT 40.9 02/18/2014   MCV 90.9 02/18/2014   PLT 198.0 02/18/2014   Lab Results  Component Value Date   TSH 1.07 02/18/2014     Chemistry      Component Value Date/Time   NA 142 02/18/2014 1156   K 4.8 02/18/2014 1156   CL 108 02/18/2014 1156   CO2 29 02/18/2014 1156   BUN 16 02/18/2014 1156   CREATININE 0.63 02/18/2014 1156      Component Value Date/Time   CALCIUM 10.3 02/18/2014 1156   ALKPHOS 85 02/18/2014 1156   AST 24 02/18/2014 1156   ALT 16 02/18/2014 1156   BILITOT 0.8 02/18/2014 1156     No results found for: VITAMINB12   IMPRESSION/PLAN  1.  Nocturnal headache.  -She does not wish to undergo a sleep study.  I think it would be of value to at least get a nocturnal pulse ox on her.  She was willing to do that.  We can readdress the sleep study in the future.  -Obtain labs to include B12, sedimentation rate, RPR.  She complains about some memory loss.  -She wants no medication for headache. 2.  New onset shortness of breath and palpitations  -She states that it is intermittent, but seems correlated with these headaches.  She  has an appointment in a few weeks with her cardiologist.  She denies any chest pain.  I did email her cardiologist to make him aware. 3.  Cervical dystonia  -She has had rather significant dysphagia even with low doses of Botox.  Her head titubation is fairly well controlled now, and it is my recommendation that we do not proceed with further Botox and she agreed today.

## 2014-04-12 NOTE — Patient Instructions (Signed)
1. Your provider has requested that you have labwork completed today. Please go to [facility] on the first floor of this building before leaving the office today. 2. We will contact you regarding pulse ox night time evaluation.

## 2014-04-13 LAB — RPR

## 2014-04-13 LAB — VITAMIN B12: Vitamin B-12: 677 pg/mL (ref 211–911)

## 2014-04-13 LAB — SEDIMENTATION RATE: SED RATE: 4 mm/h (ref 0–30)

## 2014-04-15 DIAGNOSIS — R0602 Shortness of breath: Secondary | ICD-10-CM | POA: Diagnosis not present

## 2014-04-19 DIAGNOSIS — H169 Unspecified keratitis: Secondary | ICD-10-CM | POA: Diagnosis not present

## 2014-04-22 ENCOUNTER — Encounter: Payer: Self-pay | Admitting: Medical

## 2014-04-22 ENCOUNTER — Ambulatory Visit (INDEPENDENT_AMBULATORY_CARE_PROVIDER_SITE_OTHER): Payer: Medicare Other | Admitting: Medical

## 2014-04-22 ENCOUNTER — Telehealth: Payer: Self-pay | Admitting: Family Medicine

## 2014-04-22 ENCOUNTER — Telehealth: Payer: Self-pay | Admitting: Internal Medicine

## 2014-04-22 ENCOUNTER — Telehealth: Payer: Self-pay | Admitting: Neurology

## 2014-04-22 VITALS — BP 130/70 | HR 84 | Temp 98.3°F | Ht 59.0 in | Wt 129.8 lb

## 2014-04-22 DIAGNOSIS — R51 Headache: Principal | ICD-10-CM

## 2014-04-22 DIAGNOSIS — R5383 Other fatigue: Secondary | ICD-10-CM | POA: Diagnosis not present

## 2014-04-22 DIAGNOSIS — R7981 Abnormal blood-gas level: Secondary | ICD-10-CM

## 2014-04-22 DIAGNOSIS — R0600 Dyspnea, unspecified: Secondary | ICD-10-CM | POA: Diagnosis not present

## 2014-04-22 DIAGNOSIS — R519 Headache, unspecified: Secondary | ICD-10-CM

## 2014-04-22 MED ORDER — ALBUTEROL SULFATE HFA 108 (90 BASE) MCG/ACT IN AERS: 2.0000 | INHALATION_SPRAY | Freq: Four times a day (QID) | RESPIRATORY_TRACT | Status: DC | PRN

## 2014-04-22 MED ORDER — BECLOMETHASONE DIPROPIONATE 40 MCG/ACT IN AERS: 2.0000 | INHALATION_SPRAY | Freq: Two times a day (BID) | RESPIRATORY_TRACT | Status: DC

## 2014-04-22 NOTE — Patient Instructions (Signed)
Dyspnea Mild and transient with activity. Occasional wheezing. Some second hand smoke exposure when young. Get cxr today. Rx qvar and albuterol. Use as directed.  If at any point chest pain then ED evaluation.  Recommended not do dental surgery until she is cleared by cardiologist.   Fatigue Cbc and cmp  Order placed future asked her to get it tomorrow.    Folllow up in 7 days or as needed.

## 2014-04-22 NOTE — Assessment & Plan Note (Signed)
Cbc and cmp  Order placed future asked her to get it tomorrow.

## 2014-04-22 NOTE — Progress Notes (Signed)
Subjective:    Patient ID: [name], female    DOB: 30-Aug-1921, 81 y.o.   MRN: [id number]  HPI  Pt in with reported feeling fatigue. She states feeling fatigued and winded at times. Pt states feels sob on and off during the day. Most with activity. At rest feels good. But with activity and walking will feel short of breath. No hx of smoking. Her symptoms have been for about 6 months.   Pt for longest time pt thought symptoms above related to her age.   Pt when she walks occasionally will feel mild wheeze.   Pt has concern about upcoming surgery by oral surgeon.(pt thinks will have 4 hour surgery)  Pt will see cardiologist on 25th. No chest pain. Pt had ekg done last year and looked ok. She   Some second hand smoke history. Pt exposure for about 15 years. Her husband was 1- 2 pack a day smoker.    Review of Systems  Constitutional: Positive for fatigue. Negative for fever and chills.  Respiratory: Positive for wheezing. Negative for cough, chest tightness and shortness of breath.        Mid occassional wheeze.   Cardiovascular: Negative for chest pain and palpitations.  Genitourinary: Negative for dysuria, decreased urine volume and difficulty urinating.  Musculoskeletal: Negative for back pain.  Neurological: Negative for dizziness, seizures, speech difficulty, numbness and headaches.  Hematological: Negative for adenopathy. Does not bruise/bleed easily.   Past Medical History  Diagnosis Date  . Chest pain 12/09    recent hospitalization for it. felt to be noncardiac.   [name] CAD (coronary artery disease)     a. s/p prior anterior wall myocardial infarction and multiple percutaneous coronary intervention.;  b. Aden MV 1/14:  low risk, inf and apical defect likely due to atten artifact, no ischemia, EF 35% (visually normal and est 55-60%)  . Ischemic cardiomyopathy     EF 25-30% improved to 45% with biventricular pacing.   . Systolic heart failure     improved to class I  to II   . Automatic implantable cardioverter/defibrillator (AICD) activation     s/p implantable cardioverter-defibrillator bivenrticular pacer. 12/05. St, Jude Medtronic. remote-no.  [name] HTN (hypertension)   . Hyperlipidemia   . GERD (gastroesophageal reflux disease)   . Esophageal stricture     last dialated 2007  . Hiatal hernia   . DD (diverticular disease)     Hx of it - severe. Left colon 2004  . Other urinary problems     urinary incontenance  . UTI (lower urinary tract infection)     recurrent. (Klebsiella pneumoniae). Last Cx 02/03/09. (R-ancef, nitrofurantoin, Augmentin, Zyosyn)  . Arthritis   . PVT (paroxysmal ventricular tachycardia)   . CHF (congestive heart failure)   . Hypothyroidism   . Anxiety     sleep  . Pacemaker   . Automatic implantable cardioverter-defibrillator in situ   . Heart murmur   . History of recurrent UTIs   . Shortness of breath     with exertion  . Urgency of urination   . Frequent urination at night     History   Social History  . Marital Status: Widowed    Spouse Name: N/A  . Number of Children: N/A  . Years of Education: N/A   Occupational History  . Not on file.   Social History Main Topics  . Smoking status: Never Smoker   . Smokeless tobacco: Never Used  . Alcohol Use: No  .  Drug Use: No  . Sexual Activity: Not on file   Other Topics Concern  . Not on file   Social History Narrative   Widowed, husband died of brain tumor. Son [place] in [place], [place]- was in a MVA for DWI. He has had a long hx of alcohol problems, she has helped him out financially in the past but is ready to be much stricter. Pt. Has a daughter who live in [place] with Breast Ca. She also lost a son to a brain tumor.     Past Surgical History  Procedure Laterality Date  . Angioplasty      stent  . Unspecified area hysterectomy    . Tubal ligation    . Appendectomy    . Cardiac defibrillator placement      [facility]  . Cataract extraction, bilateral    .  Total knee arthroplasty      bilateral  . Tonsillectomy    . Hammertoe reconstruction with weil osteotomy Right 07/17/2012    Procedure: RIGHT SECOND AND THIRD MT WEIL OSTEOTOMIES AND SECOND AND THIRD HAMMERTOE CORRECTIONS;  Surgeon: Toni ArthursJohn Hewitt, MD;  Location: [facility] OR;  Service: Orthopedics;  Laterality: Right;  . Colonoscopy w/ polypectomy    . Cardiac catheterization      stent  . Insert / replace / remove pacemaker    . Hammer toe surgery Left 01/15/2013    Procedure: LEFT SECOND THROUGH FOURTH HAMMERTOE CORRECTION ;  Surgeon: Toni ArthursJohn Hewitt, MD;  Location: [facility] OR;  Service: Orthopedics;  Laterality: Left;  . Weil osteotomy Left 01/15/2013    Procedure:  WEIL OSTEOTOMY AND DORSAL CAPSULOTOMY;  Surgeon: Toni ArthursJohn Hewitt, MD;  Location: [facility] OR;  Service: Orthopedics;  Laterality: Left;    Family History  Problem Relation Age of Onset  . Colon cancer Neg Hx   . Colon polyps Sister   . Heart disease Mother   . Heart disease Father   . Heart disease Brother     multiple brothers  . Heart disease Sister     multiple sisters  . Prostate cancer Brother   . Diabetes Daughter   . Diabetes      aunt  . Breast cancer Daughter   . Alcohol abuse Son   . Brain cancer Son   . Lung cancer Son     Allergies  Allergen Reactions  . Sulfonamide Derivatives     REACTION: rash and itching    Current Outpatient Prescriptions on File Prior to Visit  Medication Sig Dispense Refill  . aspirin (ASPIR-81) 81 MG EC tablet Take 81 mg by mouth daily.      . furosemide (LASIX) 20 MG tablet Take 1 tablet (20 mg total) by mouth daily as needed for fluid or edema. 90 tablet 1  . hydrocortisone 1 % ointment Apply rectally twice a day as needed for rectal irritation 30 g 1  . isosorbide mononitrate (IMDUR) 30 MG 24 hr tablet TAKE 1 AND 1/2 TABLETS EVERY MORNING 45 tablet 6  . levothyroxine (SYNTHROID, LEVOTHROID) 100 MCG tablet Take 1 tablet (100 mcg total) by mouth daily. 90 tablet 1  . metoprolol succinate  (TOPROL-XL) 50 MG 24 hr tablet Take 1 tablet (50 mg total) by mouth 2 (two) times daily. Take with or immediately following a meal. 60 tablet 3  . pravastatin (PRAVACHOL) 20 MG tablet TAKE 1 TABLET (20 MG TOTAL) BY MOUTH AT BEDTIME. 90 tablet 1  . Psyllium (METAFIBER) 48.57 % POWD Take 10 mLs by mouth  every evening.     . ramipril (ALTACE) 5 MG capsule Take 1 capsule (5 mg total) by mouth daily. 30 capsule 6  . ranitidine (ZANTAC) 150 MG tablet Take 1 tablet (150 mg total) by mouth daily. 30 tablet 3  . nitroGLYCERIN (NITROSTAT) 0.4 MG SL tablet Place 1 tablet (0.4 mg total) under the tongue every 5 (five) minutes as needed. At the onset of chest pain, Up to 3 doses (Patient not taking: Reported on 04/12/2014) 10 tablet 0   No current facility-administered medications on file prior to visit.    BP 130/70 mmHg  Pulse 84  Temp(Src) 98.3 F (36.8 C) (Oral)  Ht  (1.499 m)  Wt 129 lb 12.8 oz (58.877 kg)  BMI 26.20 kg/m2  SpO2 96%       Objective:   Physical Exam  General  Mental Status - Alert. General Appearance - Well groomed. Not in acute distress.  Skin Rashes- No Rashes.  HEENT Head- Normal. Ear Auditory Canal - Left- Normal. Right - Normal.Tympanic Membrane- Left- Normal. Right- Normal. Eye Sclera/Conjunctiva- Left- Normal. Right- Normal. Nose & Sinuses Nasal Mucosa- Left-  Not boggy or Congested. Right-  Not  boggy or Congested. Mouth & Throat Lips: Upper Lip- Normal: no dryness, cracking, pallor, cyanosis, or vesicular eruption. Lower Lip-Normal: no dryness, cracking, pallor, cyanosis or vesicular eruption. Buccal Mucosa- Bilateral- No Aphthous ulcers. Oropharynx- No Discharge or Erythema. Tonsils: Characteristics- Bilateral- No Erythema or Congestion. Size/Enlargement- Bilateral- No enlargement. Discharge- bilateral-None.  Neck Neck- Supple. No Masses.   Chest and Lung Exam Auscultation: Breath Sounds:- even and unlabored, mild shallow  respirations.  Cardiovascular Auscultation:Rythm- Regular, rate and rhythm. Murmurs & Other Heart Sounds:Ausculatation of the heart reveal- No Murmurs.  Lymphatic Head & Neck General Head & Neck Lymphatics: Bilateral: Description- No Localized lymphadenopathy.  Lower ext- negative homans signs. No pedal edema.       Assessment & Plan:

## 2014-04-22 NOTE — Telephone Encounter (Signed)
Pt c/o Shortness Of Breath: STAT if SOB developed within the last 24 hours or pt is noticeably SOB on the phone  1. Are you currently SOB (can you hear that pt is SOB on the phone)? Yes  2. How long have you been experiencing SOB? 1week  3. Are you SOB when sitting or when up moving around? Both  4. Are you currently experiencing any other symptoms? No

## 2014-04-22 NOTE — Telephone Encounter (Signed)
Patient has appointment with Esperanza RichtersEdward Saguier, PA at 5:15 today

## 2014-04-22 NOTE — Progress Notes (Signed)
Pre visit review using our clinic review tool, if applicable. No additional management support is needed unless otherwise documented below in the visit note. 

## 2014-04-22 NOTE — Assessment & Plan Note (Signed)
Mild and transient with activity. Occasional wheezing. Some second hand smoke exposure when young. Get cxr today. Rx qvar and albuterol. Use as directed.  If at any point chest pain then ED evaluation.  Recommended not do dental surgery until she is cleared by cardiologist.

## 2014-04-22 NOTE — Telephone Encounter (Signed)
Spoke with patient and her granddaughter. They would like to have sleep medicine consult. Referral made and appt scheduled with Dr Craige CottaSood. First available - June 16th at 9:00 am. Made aware of appt. Study results faxed to Dr Craige CottaSood at [phone] with confirmation received.

## 2014-04-22 NOTE — Telephone Encounter (Signed)
Patient Name: Sherwood GamblerDOROTHY Granillo DOB: 11/17/1921 Initial Comment Caller states her grandmother is short of breath Nurse Assessment Nurse: Elijah Birkaldwell, RN, Lynda Date/Time (Eastern Time): 04/22/2014 11:46:38 AM Confirm and document reason for call. If symptomatic, describe symptoms. ---Caller states her grandmother is short of breath, has been for about a week now. Seems like she is drained of energy as well. Has some dental work scheduled next week, worried about her being SOB. The pt. has left to go out to lunch, not there now. Has the patient traveled out of the country within the last 30 days? ---Not Applicable Does the patient require triage? ---Yes Related visit to physician within the last 2 weeks? ---No Does the PT have any chronic conditions? (i.e. diabetes, asthma, etc.) ---Yes List chronic conditions. ---defibrillator, pacemaker Guidelines Guideline Title Affirmed Question Affirmed Notes Breathing Difficulty [1] MODERATE difficulty breathing (e.g., speaks in phrases, SOB even at rest, pulse 100-120) AND [2] NEW-onset or WORSE than normal Final Disposition User Go to ED Now Elijah Birkaldwell, RN, Stark BrayLynda Comments Caller states her grandmother is out to lunch & will probably not agree to going to ER. She spoke with pt's cardiologist already & they recommended that she see her PCP who is not in office today. Caller requested an appt. today in case she can not convince her grandmother to go to the ER. States she is a very active lady for her age & she has noticed her being more SOB. Renewed her driving license yesterday. Please call caller if there is a cancellation/earlier appt. available.

## 2014-04-22 NOTE — Telephone Encounter (Signed)
Tell pt that I got her overnight pulse-ox study back.  Mean pulse ox was 92% but 25 min of the night were spent <89% but never less than 86%.  Those are still pretty low and wonder if that is why having AM headache even though it isn't that long.  Tell her that we can refer to pulmonary/sleep medicine and see what they have to say first OR we can get get the formal sleep study first and see if it shows anything else.  Up to her which approach she wants to start with but may be easiest for her to get the consult first. If she does that, please make sure that they have the pulse ox study.

## 2014-04-22 NOTE — Telephone Encounter (Signed)
(  patient in background talking) Reports SOB over past week.  Occuring at rest and on exertion. Denies any swelling or other symptoms. Advised Dr. Graciela HusbandsKlein is currently out of the country.  They will contact their PCP first to see if they can be see there and will call office back if they feel cardiology related.

## 2014-04-23 ENCOUNTER — Ambulatory Visit (HOSPITAL_BASED_OUTPATIENT_CLINIC_OR_DEPARTMENT_OTHER)
Admission: RE | Admit: 2014-04-23 | Discharge: 2014-04-23 | Disposition: A | Discharge status: Home / Self Care | Payer: Medicare Other | Source: Ambulatory Visit | Attending: Medical | Admitting: Medical

## 2014-04-23 DIAGNOSIS — R062 Wheezing: Secondary | ICD-10-CM | POA: Diagnosis not present

## 2014-04-23 DIAGNOSIS — R0600 Dyspnea, unspecified: Secondary | ICD-10-CM

## 2014-05-03 ENCOUNTER — Encounter: Payer: Self-pay | Admitting: Internal Medicine

## 2014-05-03 ENCOUNTER — Ambulatory Visit (INDEPENDENT_AMBULATORY_CARE_PROVIDER_SITE_OTHER): Payer: Medicare Other | Admitting: Internal Medicine

## 2014-05-03 VITALS — BP 150/72 | HR 66 | Ht 60.0 in | Wt 129.4 lb

## 2014-05-03 DIAGNOSIS — I5022 Chronic systolic (congestive) heart failure: Secondary | ICD-10-CM | POA: Diagnosis not present

## 2014-05-03 DIAGNOSIS — I472 Ventricular tachycardia: Secondary | ICD-10-CM

## 2014-05-03 DIAGNOSIS — I4729 Other ventricular tachycardia: Secondary | ICD-10-CM

## 2014-05-03 DIAGNOSIS — Z95810 Presence of automatic (implantable) cardiac defibrillator: Secondary | ICD-10-CM | POA: Diagnosis not present

## 2014-05-03 DIAGNOSIS — Z4502 Encounter for adjustment and management of automatic implantable cardiac defibrillator: Secondary | ICD-10-CM | POA: Diagnosis not present

## 2014-05-03 LAB — MDC_IDC_ENUM_SESS_TYPE_INCLINIC
Brady Statistic RV Percent Paced: 97 %
HighPow Impedance: 41 Ohm
Lead Channel Impedance Value: 362.5 Ohm
Lead Channel Pacing Threshold Amplitude: 1 V
Lead Channel Pacing Threshold Amplitude: 1 V
Lead Channel Pacing Threshold Amplitude: 1.5 V
Lead Channel Pacing Threshold Pulse Width: 0.8 ms
Lead Channel Pacing Threshold Pulse Width: 0.8 ms
Lead Channel Sensing Intrinsic Amplitude: 2.9 mV
Lead Channel Setting Pacing Amplitude: 2 V
Lead Channel Setting Pacing Amplitude: 2.5 V
Lead Channel Setting Pacing Amplitude: 2.75 V
Lead Channel Setting Pacing Pulse Width: 0.5 ms
Lead Channel Setting Pacing Pulse Width: 0.8 ms
Lead Channel Setting Sensing Sensitivity: 0.5 mV
MDC IDC MSMT BATTERY REMAINING LONGEVITY: 16.8 mo
MDC IDC MSMT LEADCHNL LV PACING THRESHOLD AMPLITUDE: 1.5 V
MDC IDC MSMT LEADCHNL RA IMPEDANCE VALUE: 425 Ohm
MDC IDC MSMT LEADCHNL RA PACING THRESHOLD PULSEWIDTH: 0.5 ms
MDC IDC MSMT LEADCHNL RA PACING THRESHOLD PULSEWIDTH: 0.5 ms
MDC IDC MSMT LEADCHNL RV IMPEDANCE VALUE: 650 Ohm
MDC IDC MSMT LEADCHNL RV PACING THRESHOLD AMPLITUDE: 1.75 V
MDC IDC MSMT LEADCHNL RV PACING THRESHOLD PULSEWIDTH: 0.5 ms
MDC IDC MSMT LEADCHNL RV SENSING INTR AMPL: 6.7 mV
MDC IDC PG SERIAL: 815822
MDC IDC SESS DTM: 20160425134750
MDC IDC STAT BRADY RA PERCENT PACED: 0.63 %
Zone Setting Detection Interval: 270 ms
Zone Setting Detection Interval: 300 ms
Zone Setting Detection Interval: 365 ms

## 2014-05-03 NOTE — Patient Instructions (Signed)
Medication Instructions:  Your physician recommends that you continue on your current medications as directed. Please refer to the Current Medication list given to you today.  Labwork: None  Testing/Procedures: None  Follow-Up: Remote monitoring is used to monitor your Pacemaker of ICD from home. This monitoring reduces the number of office visits required to check your device to one time per year. It allows us to keep an eye on the functioning of your device to ensure it is working properly. You are scheduled for a device check from home on 08/02/14. You may send your transmission at any time that day. If you have a wireless device, the transmission will be sent automatically. After your physician reviews your transmission, you will receive a postcard with your next transmission date.  Your physician wants you to follow-up in: 1 year with Dr. Graciela HusbandsKlein.  You will receive a reminder letter in the mail two months in advance. If you don't receive a letter, please call our office to schedule the follow-up appointment.   Any Other Special Instructions Will Be Listed Below (If Applicable). Please leave your Merlin monitor plugged in at all times.

## 2014-05-03 NOTE — Progress Notes (Signed)
Patient Care Team: Lelon Perla, DO as PCP - General (Family Medicine) Vladimir Faster, DO as Consulting Physician (Neurology) Hart Carwin, MD as Consulting Physician (Gastroenterology) Duke Salvia, MD as Consulting Physician (Cardiology)   HPI  [name] is a 81 y.o. female Seen in followup for coronary artery disease with multiple prior PCI and congestive heart failure. She has an ischemic cardiomyopathy and is status post CRT-D implantation with interval improvement. Generator replacement in November 2011   She continues to travel. She has been back to [place] and has also been to [place] visiting the [place] as well as [place]. She anticipates returning to [place] in just a few months with her granddaughter  She tolerated her hammertoe surgery very well although she did have a fall.  She has an ICD discharge; however, there were no symptoms.   She has a RIATA 1500 lead in place  Last [facility] 10/05: RCA stent site patent, PL branch of the RCA 50%, dCFX 80% treated with Taxus DES  Nuclear study a 12/10: No scar or ischemia, EF 54%.  Repeat nuclear study 1/14 because of chest pain demonstrated no significant ischemia. Ejection fraction is measured at 35% although visualization thought to be about 55-60% with normal wall motion      Past Medical History  Diagnosis Date  . Chest pain 12/09    recent hospitalization for it. felt to be noncardiac.   [name] CAD (coronary artery disease)     a. s/p prior anterior wall myocardial infarction and multiple percutaneous coronary intervention.;  b. [facility] [facility] 1/14:  low risk, inf and apical defect likely due to atten artifact, no ischemia, EF 35% (visually normal and est 55-60%)  . Ischemic cardiomyopathy     EF 25-30% improved to 45% with biventricular pacing.   . Systolic heart failure     improved to class I to II   . Automatic implantable cardioverter/defibrillator (AICD) activation     s/p implantable  cardioverter-defibrillator bivenrticular pacer. 12/05. St, Jude Medtronic. remote-no.  [name] HTN (hypertension)   . Hyperlipidemia   . GERD (gastroesophageal reflux disease)   . Esophageal stricture     last dialated 2007  . Hiatal hernia   . DD (diverticular disease)     Hx of it - severe. Left colon 2004  . Other urinary problems     urinary incontenance  . UTI (lower urinary tract infection)     recurrent. (Klebsiella pneumoniae). Last Cx 02/03/09. (R-ancef, nitrofurantoin, Augmentin, Zyosyn)  . Arthritis   . PVT (paroxysmal ventricular tachycardia)   . CHF (congestive heart failure)   . Hypothyroidism   . Anxiety     sleep  . Pacemaker   . Automatic implantable cardioverter-defibrillator in situ   . Heart murmur   . History of recurrent UTIs   . Shortness of breath     with exertion  . Urgency of urination   . Frequent urination at night     Past Surgical History  Procedure Laterality Date  . Angioplasty      stent  . Unspecified area hysterectomy    . Tubal ligation    . Appendectomy    . Cardiac defibrillator placement      [facility]  . Cataract extraction, bilateral    . Total knee arthroplasty      bilateral  . Tonsillectomy    . Hammertoe reconstruction with weil osteotomy Right 07/17/2012    Procedure: RIGHT SECOND  AND THIRD MT WEIL OSTEOTOMIES AND SECOND AND THIRD HAMMERTOE CORRECTIONS;  Surgeon: Toni Arthurs, MD;  Location: [facility] OR;  Service: Orthopedics;  Laterality: Right;  . Colonoscopy w/ polypectomy    . Cardiac catheterization      stent  . Insert / replace / remove pacemaker    . Hammer toe surgery Left 01/15/2013    Procedure: LEFT SECOND THROUGH FOURTH HAMMERTOE CORRECTION ;  Surgeon: Toni Arthurs, MD;  Location: [facility] OR;  Service: Orthopedics;  Laterality: Left;  . Weil osteotomy Left 01/15/2013    Procedure:  WEIL OSTEOTOMY AND DORSAL CAPSULOTOMY;  Surgeon: Toni Arthurs, MD;  Location: [facility] OR;  Service: Orthopedics;  Laterality: Left;    Current Outpatient  Prescriptions  Medication Sig Dispense Refill  . albuterol (PROVENTIL HFA;VENTOLIN HFA) 108 (90 BASE) MCG/ACT inhaler Inhale 2 puffs into the lungs every 6 (six) hours as needed for wheezing or shortness of breath. 1 Inhaler 0  . aspirin (ASPIR-81) 81 MG EC tablet Take 81 mg by mouth daily.      . beclomethasone (QVAR) 40 MCG/ACT inhaler Inhale 2 puffs into the lungs 2 (two) times daily. 1 Inhaler 2  . furosemide (LASIX) 20 MG tablet Take 1 tablet (20 mg total) by mouth daily as needed for fluid or edema. 90 tablet 1  . hydrocortisone 1 % ointment Apply rectally twice a day as needed for rectal irritation 30 g 1  . isosorbide mononitrate (IMDUR) 30 MG 24 hr tablet TAKE 1 AND 1/2 TABLETS EVERY MORNING 45 tablet 6  . levothyroxine (SYNTHROID, LEVOTHROID) 100 MCG tablet Take 1 tablet (100 mcg total) by mouth daily. 90 tablet 1  . metoprolol succinate (TOPROL-XL) 50 MG 24 hr tablet Take 1 tablet (50 mg total) by mouth 2 (two) times daily. Take with or immediately following a meal. 60 tablet 3  . nitroGLYCERIN (NITROSTAT) 0.4 MG SL tablet Place 1 tablet (0.4 mg total) under the tongue every 5 (five) minutes as needed. At the onset of chest pain, Up to 3 doses 10 tablet 0  . pravastatin (PRAVACHOL) 20 MG tablet TAKE 1 TABLET (20 MG TOTAL) BY MOUTH AT BEDTIME. 90 tablet 1  . Psyllium (METAFIBER) 48.57 % POWD Take 10 mLs by mouth every evening.     . ramipril (ALTACE) 5 MG capsule Take 1 capsule (5 mg total) by mouth daily. 30 capsule 6  . ranitidine (ZANTAC) 150 MG tablet Take 1 tablet (150 mg total) by mouth daily. 30 tablet 3   No current facility-administered medications for this visit.    Allergies  Allergen Reactions  . Sulfonamide Derivatives     REACTION: rash and itching    Review of Systems negative except from HPI and PMH  Physical Exam BP 150/72 mmHg  Pulse 66  Ht 5' (1.524 m)  Wt 129 lb 6.4 oz (58.695 kg)  BMI 25.27 kg/m2 Well developed and well nourished in no acute  distress HENT normal E scleral and icterus clear Neck Supple Marked kyphosis Clear to ausculation status post Regular rate and rhythm, no murmurs gallops or rub Soft with active bowel sounds No clubbing cyanosis trace Edema Alert and oriented, grossly normal motor and sensory function Skin Warm and Dry  ECG demonstrates P. synchronous biventricular pacing    Assessment and  Plan  Atrial tachycardia  Ischemic cardiomyopathy  Hypertension  Implantable defibrillator-St. Jude  Inappropriate shock  The patient had an inappropriate shock in the context of an atrial tachycardia in the middle of March. She has  VA dissociation at baseline; we have done was to turn her VT zone into a monitor zone as there is no way to preclude inappropriate therapy confidently as  her discriminators could not function appropriately. She is euvolemic.   Blood pressure somewhat elevated; it is better at home so we'll not make any medication adjustments.  Without symptoms of ischemia

## 2014-05-04 ENCOUNTER — Telehealth: Payer: Self-pay | Admitting: Internal Medicine

## 2014-05-04 NOTE — Telephone Encounter (Signed)
New message      Pt was seen yesterday----she forgot to ask a question

## 2014-05-04 NOTE — Telephone Encounter (Signed)
Patient is needing clearance for several teeth pulled.  Advised to have dental office fax clearance request to office to address.  Patient given office fax number and will have them fax it.

## 2014-05-10 DIAGNOSIS — Z8262 Family history of osteoporosis: Secondary | ICD-10-CM | POA: Diagnosis not present

## 2014-05-10 DIAGNOSIS — M810 Age-related osteoporosis without current pathological fracture: Secondary | ICD-10-CM | POA: Diagnosis not present

## 2014-05-11 ENCOUNTER — Ambulatory Visit (INDEPENDENT_AMBULATORY_CARE_PROVIDER_SITE_OTHER): Payer: Medicare Other | Admitting: Family Medicine

## 2014-05-11 ENCOUNTER — Encounter: Payer: Self-pay | Admitting: Family Medicine

## 2014-05-11 VITALS — BP 144/72 | HR 81 | Temp 97.8°F | Wt 124.0 lb

## 2014-05-11 DIAGNOSIS — R5382 Chronic fatigue, unspecified: Secondary | ICD-10-CM

## 2014-05-11 NOTE — Patient Instructions (Signed)

## 2014-05-11 NOTE — Progress Notes (Signed)
Pre visit review using our clinic review tool, if applicable. No additional management support is needed unless otherwise documented below in the visit note. 

## 2014-05-12 ENCOUNTER — Encounter: Payer: Self-pay | Admitting: Internal Medicine

## 2014-05-12 NOTE — Assessment & Plan Note (Signed)
pulm referral put in by neuro R/o osa  rto prn

## 2014-05-12 NOTE — Progress Notes (Signed)
Patient ID: [name], female    DOB: 07-23-1921  Age: 81 y.o. MRN: [id number]    Subjective:  Subjective HPI [name] presents for fatigue that has worsened over last few weeks.  She is waiting for pulmonary referral for sleep study.    Review of Systems  Constitutional: Positive for fatigue. Negative for diaphoresis, appetite change and unexpected weight change.  Eyes: Negative for pain, redness and visual disturbance.  Respiratory: Negative for cough, chest tightness, shortness of breath and wheezing.   Cardiovascular: Negative for chest pain, palpitations and leg swelling.  Endocrine: Negative for cold intolerance, heat intolerance, polydipsia, polyphagia and polyuria.  Genitourinary: Negative for dysuria, frequency and difficulty urinating.  Neurological: Negative for dizziness, light-headedness, numbness and headaches.  Psychiatric/Behavioral: Negative for decreased concentration. The patient is not nervous/anxious.     History Past Medical History  Diagnosis Date  . Chest pain 12/09    recent hospitalization for it. felt to be noncardiac.   [name] CAD (coronary artery disease)     a. s/p prior anterior wall myocardial infarction and multiple percutaneous coronary intervention.;  b. [place] MV 1/14:  low risk, inf and apical defect likely due to atten artifact, no ischemia, EF 35% (visually normal and est 55-60%)  . Ischemic cardiomyopathy     EF 25-30% improved to 45% with biventricular pacing.   . Systolic heart failure     improved to class I to II   . Automatic implantable cardioverter/defibrillator (AICD) activation     s/p implantable cardioverter-defibrillator bivenrticular pacer. 12/05. St, Jude Medtronic. remote-no.  [name] HTN (hypertension)   . Hyperlipidemia   . GERD (gastroesophageal reflux disease)   . Esophageal stricture     last dialated 2007  . Hiatal hernia   . DD (diverticular disease)     Hx of it - severe. Left colon 2004  . Other urinary problems       urinary incontenance  . UTI (lower urinary tract infection)     recurrent. (Klebsiella pneumoniae). Last Cx 02/03/09. (R-ancef, nitrofurantoin, Augmentin, Zyosyn)  . Arthritis   . PVT (paroxysmal ventricular tachycardia)   . CHF (congestive heart failure)   . Hypothyroidism   . Anxiety     sleep  . Pacemaker   . Automatic implantable cardioverter-defibrillator in situ   . Heart murmur   . History of recurrent UTIs   . Shortness of breath     with exertion  . Urgency of urination   . Frequent urination at night     She has past surgical history that includes Angioplasty; unspecified area hysterectomy; Tubal ligation; Appendectomy; Cardiac defibrillator placement; Cataract extraction, bilateral; Total knee arthroplasty; Tonsillectomy; Hammertoe reconstruction with weil osteotomy (Right, 07/17/2012); Colonoscopy w/ polypectomy; Cardiac catheterization; Insert / replace / remove pacemaker; Hammer toe surgery (Left, 01/15/2013); and Weil osteotomy (Left, 01/15/2013).   Her family history includes Alcohol abuse in her son; Brain cancer in her son; Breast cancer in her daughter; Colon polyps in her sister; Diabetes in her daughter and another family member; Heart disease in her brother, father, mother, and sister; Lung cancer in her son; Prostate cancer in her brother. There is no history of Colon cancer.She reports that she has never smoked. She has never used smokeless tobacco. She reports that she does not drink alcohol or use illicit drugs.  Current Outpatient Prescriptions on File Prior to Visit  Medication Sig Dispense Refill  . albuterol (PROVENTIL HFA;VENTOLIN HFA) 108 (90 BASE) MCG/ACT inhaler Inhale 2 puffs into the  lungs every 6 (six) hours as needed for wheezing or shortness of breath. 1 Inhaler 0  . aspirin (ASPIR-81) 81 MG EC tablet Take 81 mg by mouth daily.      . beclomethasone (QVAR) 40 MCG/ACT inhaler Inhale 2 puffs into the lungs 2 (two) times daily. 1 Inhaler 2  . furosemide  (LASIX) 20 MG tablet Take 1 tablet (20 mg total) by mouth daily as needed for fluid or edema. 90 tablet 1  . hydrocortisone 1 % ointment Apply rectally twice a day as needed for rectal irritation 30 g 1  . isosorbide mononitrate (IMDUR) 30 MG 24 hr tablet TAKE 1 AND 1/2 TABLETS EVERY MORNING 45 tablet 6  . levothyroxine (SYNTHROID, LEVOTHROID) 100 MCG tablet Take 1 tablet (100 mcg total) by mouth daily. 90 tablet 1  . metoprolol succinate (TOPROL-XL) 50 MG 24 hr tablet Take 1 tablet (50 mg total) by mouth 2 (two) times daily. Take with or immediately following a meal. 60 tablet 3  . nitroGLYCERIN (NITROSTAT) 0.4 MG SL tablet Place 1 tablet (0.4 mg total) under the tongue every 5 (five) minutes as needed. At the onset of chest pain, Up to 3 doses 10 tablet 0  . pravastatin (PRAVACHOL) 20 MG tablet TAKE 1 TABLET (20 MG TOTAL) BY MOUTH AT BEDTIME. 90 tablet 1  . Psyllium (METAFIBER) 48.57 % POWD Take 10 mLs by mouth every evening.     . ramipril (ALTACE) 5 MG capsule Take 1 capsule (5 mg total) by mouth daily. 30 capsule 6  . ranitidine (ZANTAC) 150 MG tablet Take 1 tablet (150 mg total) by mouth daily. (Patient not taking: Reported on 05/11/2014) 30 tablet 3   No current facility-administered medications on file prior to visit.     Objective:  Objective Physical Exam  Constitutional: She is oriented to person, place, and time. She appears well-developed and well-nourished. No distress.  HENT:  Right Ear: External ear normal.  Left Ear: External ear normal.  Nose: Nose normal.  Mouth/Throat: Oropharynx is clear and moist.  Eyes: EOM are normal. Pupils are equal, round, and reactive to light.  Neck: Normal range of motion. Neck supple.  Cardiovascular: Normal rate, regular rhythm and normal heart sounds.   No murmur heard. Pulmonary/Chest: Effort normal and breath sounds normal. No respiratory distress. She has no wheezes. She has no rales. She exhibits no tenderness.  Neurological: She is  alert and oriented to person, place, and time.  Psychiatric: She has a normal mood and affect. Her behavior is normal. Judgment and thought content normal.   BP 144/72 mmHg  Pulse 81  Temp(Src) 97.8 F (36.6 C) (Oral)  Wt 124 lb (56.246 kg)  SpO2 96% Wt Readings from Last 3 Encounters:  05/11/14 124 lb (56.246 kg)  05/03/14 129 lb 6.4 oz (58.695 kg)  04/22/14 129 lb 12.8 oz (58.877 kg)     Lab Results  Component Value Date   WBC 7.2 02/18/2014   HGB 13.8 02/18/2014   HCT 40.9 02/18/2014   PLT 198.0 02/18/2014   GLUCOSE 96 02/18/2014   CHOL 120 02/18/2014   TRIG 75.0 02/18/2014   HDL 43.00 02/18/2014   LDLCALC 62 02/18/2014   ALT 16 02/18/2014   AST 24 02/18/2014   NA 142 02/18/2014   K 4.8 02/18/2014   CL 108 02/18/2014   CREATININE 0.63 02/18/2014   BUN 16 02/18/2014   CO2 29 02/18/2014   TSH 1.07 02/18/2014   INR 0.9 ratio 11/14/2009   HGBA1C  01/02/2008    6.1 (NOTE)   The ADA recommends the following therapeutic goal for glycemic   control related to Hgb A1C measurement:   Goal of Therapy:   < 7.0% Hgb A1C   Reference: American Diabetes Association: Clinical Practice   Recommendations 2008, Diabetes Care,  2008, 31:(Suppl 1).    Dg Chest 2 View  04/23/2014   CLINICAL DATA:  Intermittent dyspnea with occasional wheezing  EXAM: CHEST  2 VIEW  COMPARISON:  03/06/2013  FINDINGS: Moderate stable hiatal hernia. Normal heart size and vascular pattern. Elevated right diaphragm. Left anterior chest wall AICD noted. Lungs clear. There is atelectasis above the left-sided hiatal hernia. No pleural effusion.  IMPRESSION: No acute findings.   Electronically Signed   By: Esperanza Heiraymond  Rubner M.D.   On: 04/23/2014 14:43     Assessment & Plan:  Plan I am having [name] maintain her Psyllium, aspirin, furosemide, ramipril, isosorbide mononitrate, nitroGLYCERIN, pravastatin, hydrocortisone, ranitidine, levothyroxine, metoprolol succinate, beclomethasone, and albuterol.  No orders  of the defined types were placed in this encounter.    Problem List Items Addressed This Visit    None      Follow-up: Return in about 3 months (around 08/11/2014), or if symptoms worsen or fail to improve, for f/u.  Loreen FreudYvonne Lowne, DO

## 2014-05-18 NOTE — Telephone Encounter (Signed)
F[facility]ed [facility].  Advised to avoid epinephrine, no antibiotic prophylaxis needed.

## 2014-05-21 ENCOUNTER — Encounter: Payer: Self-pay | Admitting: Neurology

## 2014-05-25 ENCOUNTER — Telehealth: Payer: Self-pay | Admitting: Family Medicine

## 2014-05-25 NOTE — Telephone Encounter (Signed)
Caller name:Williams,Donna Relation to pt: grandaughter  Call back number: [phone] Pharmacy: [id number] [place] - [address] [phone] (Phone) [phone] (Fax)          Reason for call:   Pt requesting a refill albuterol (PROVENTIL HFA;VENTOLIN HFA) 108 (90 BASE) MCG/ACT inhaler

## 2014-05-27 MED ORDER — ALBUTEROL SULFATE HFA 108 (90 BASE) MCG/ACT IN AERS: 2.0000 | INHALATION_SPRAY | Freq: Four times a day (QID) | RESPIRATORY_TRACT | Status: DC | PRN

## 2014-05-27 NOTE — Telephone Encounter (Signed)
Rx faxed.    KP 

## 2014-06-01 ENCOUNTER — Other Ambulatory Visit: Payer: Self-pay | Admitting: General Practice

## 2014-06-01 MED ORDER — RAMIPRIL 5 MG PO CAPS: 5.0000 mg | ORAL_CAPSULE | Freq: Every day | ORAL | Status: DC

## 2014-06-24 ENCOUNTER — Encounter: Payer: Self-pay | Admitting: Pulmonary Disease

## 2014-06-24 ENCOUNTER — Ambulatory Visit (INDEPENDENT_AMBULATORY_CARE_PROVIDER_SITE_OTHER): Payer: Medicare Other | Admitting: Pulmonary Disease

## 2014-06-24 VITALS — BP 110/62 | HR 78 | Ht 59.0 in | Wt 118.6 lb

## 2014-06-24 DIAGNOSIS — R0600 Dyspnea, unspecified: Secondary | ICD-10-CM

## 2014-06-24 DIAGNOSIS — R0683 Snoring: Secondary | ICD-10-CM | POA: Diagnosis not present

## 2014-06-24 DIAGNOSIS — G4734 Idiopathic sleep related nonobstructive alveolar hypoventilation: Secondary | ICD-10-CM

## 2014-06-24 DIAGNOSIS — I5022 Chronic systolic (congestive) heart failure: Secondary | ICD-10-CM

## 2014-06-24 MED ORDER — RANITIDINE HCL 150 MG PO TABS: 150.0000 mg | ORAL_TABLET | Freq: Every day | ORAL | Status: DC

## 2014-06-24 NOTE — Progress Notes (Signed)
Chief Complaint  Patient presents with  . SLEEP CONSULT    Referred by Dr Arbutus Leas. Epworth Score:     History of Present Illness: [name] is a 81 y.o. female for evaluation dyspnea, cough, wheeze, and nocturnal hypoxemia.  She has hx of headaches.  These happens predominantly at night.  She has been followed by Dr. Arbutus Leas with neurology.  She had overnight oximetry in April 2016 >> this showed SpO2 low of 86% and 25 min with SpO2 < 89%.  She is followed by Cardiology for CAD, systolic CHF (EF 81%).  She has hx of hiatal hernia, and chronic elevation of Rt hemidiaphragm.  She tends to not be bothered by her symptoms.  She was never aware of having headaches until she saw neurology.  She also never was aware she had an issue with her breathing until her family commented on this.  She will get cough and wheeze from her chest and neck area.  She denies globus sensation or sinus congestion/post nasal drip.  She gets heartburn occasionally.  She will get winded with activity if she pushes herself too much.  She was started on Qvar and albuterol in April.  She feels these help some.  She is not using them all the time.  She used to take zantac for reflux >> she has not taken this for years.  She is not sure why this was stopped.  There is no history of asthma.  She never smoked, but did have second hand tobacco exposure.  She has been told she snores at night.  She has to sleep on two pillows.  She will wake up feeling like she can't catch her breath, and has to force herself to take deep breaths.  She goes to sleep between 9 and 1030 pm.  She falls asleep after 15 to 20 minutes.  She wakes up several times to use the bathroom.  She gets out of bed at 7 am.  She feels tire in the morning.  She denies morning headache.  She does not use anything to help her fall sleep or stay awake.  She denies sleep walking, sleep talking, bruxism, or nightmares.  There is no history of restless legs.  She denies  sleep hallucinations, sleep paralysis, or cataplexy.  The Epworth score is 7 out of 24.   [name]  has a past medical history of Chest pain (12/09); CAD (coronary artery disease); Ischemic cardiomyopathy; Systolic heart failure; Automatic implantable cardioverter/defibrillator (AICD) activation; HTN (hypertension); Hyperlipidemia; GERD (gastroesophageal reflux disease); Esophageal stricture; Hiatal hernia; DD (diverticular disease); Other urinary problems; UTI (lower urinary tract infection); Arthritis; PVT (paroxysmal ventricular tachycardia); CHF (congestive heart failure); Hypothyroidism; Anxiety; Pacemaker; Automatic implantable cardioverter-defibrillator in situ; Heart murmur; History of recurrent UTIs; Shortness of breath; Urgency of urination; and Frequent urination at night.  [name]  has past surgical history that includes Angioplasty; unspecified area hysterectomy; Tubal ligation; Appendectomy; Cardiac defibrillator placement; Cataract extraction, bilateral; Total knee arthroplasty; Tonsillectomy; Hammertoe reconstruction with weil osteotomy (Right, 07/17/2012); Colonoscopy w/ polypectomy; Cardiac catheterization; Insert / replace / remove pacemaker; Hammer toe surgery (Left, 01/15/2013); and Weil osteotomy (Left, 01/15/2013).  Prior to Admission medications   Medication Sig Start Date End Date Taking? Authorizing Provider  acetaminophen-codeine (TYLENOL #3) 300-30 MG per tablet Take 1 tablet PRN pain (dental) 06/23/14  Yes Historical Provider, MD  albuterol (PROVENTIL HFA;VENTOLIN HFA) 108 (90 BASE) MCG/ACT inhaler Inhale 2 puffs into the lungs every 6 (six) hours as needed  for wheezing or shortness of breath. 05/27/14  Yes Yvonne R Lowne, DO  amoxicillin (AMOXIL) 500 MG capsule Take 1 capsule by mouth 3 (three) times daily. 06/23/14  Yes Historical Provider, MD  aspirin (ASPIR-81) 81 MG EC tablet Take 81 mg by mouth daily.     Yes Historical Provider, MD  beclomethasone (QVAR)  40 MCG/ACT inhaler Inhale 2 puffs into the lungs 2 (two) times daily. 04/22/14  Yes Edward Saguier, PA-C  furosemide (LASIX) 20 MG tablet Take 1 tablet (20 mg total) by mouth daily as needed for fluid or edema. 12/24/12  Yes Sheliah Hatch, MD  hydrocortisone 1 % ointment Apply rectally twice a day as needed for rectal irritation 02/09/14  Yes Hart Carwin, MD  isosorbide mononitrate (IMDUR) 30 MG 24 hr tablet TAKE 1 AND 1/2 TABLETS EVERY MORNING 10/05/13  Yes Sheliah Hatch, MD  levothyroxine (SYNTHROID, LEVOTHROID) 100 MCG tablet Take 1 tablet (100 mcg total) by mouth daily. 02/18/14  Yes Sheliah Hatch, MD  metoprolol succinate (TOPROL-XL) 50 MG 24 hr tablet Take 1 tablet (50 mg total) by mouth 2 (two) times daily. Take with or immediately following a meal. 02/19/14  Yes Sheliah Hatch, MD  nitroGLYCERIN (NITROSTAT) 0.4 MG SL tablet Place 1 tablet (0.4 mg total) under the tongue every 5 (five) minutes as needed. At the onset of chest pain, Up to 3 doses 10/06/13  Yes Sheliah Hatch, MD  pravastatin (PRAVACHOL) 20 MG tablet TAKE 1 TABLET (20 MG TOTAL) BY MOUTH AT BEDTIME. 01/15/14  Yes Sheliah Hatch, MD  Psyllium (METAFIBER) 48.57 % POWD Take 10 mLs by mouth every evening.    Yes Historical Provider, MD  ramipril (ALTACE) 5 MG capsule Take 1 capsule (5 mg total) by mouth daily. 06/01/14  Yes Sheliah Hatch, MD  ranitidine (ZANTAC) 150 MG tablet Take 1 tablet (150 mg total) by mouth daily. 02/09/14  Yes Hart Carwin, MD    Allergies  Allergen Reactions  . Sulfonamide Derivatives     REACTION: rash and itching    Her family history includes Alcohol abuse in her son; Brain cancer in her son; Breast cancer in her daughter; Colon polyps in her sister; Diabetes in her daughter and another family member; Heart disease in her brother, father, mother, and sister; Lung cancer in her son; Prostate cancer in her brother. There is no history of Colon cancer.  She  reports that she has  never smoked. She has never used smokeless tobacco. She reports that she does not drink alcohol or use illicit drugs.   Physical Exam: Blood pressure 110/62, pulse 78, height  (1.499 m), weight 118 lb 9.6 oz (53.797 kg), SpO2 93 %. Body mass index is 23.94 kg/(m^2).  General - No distress ENT - No sinus tenderness, no oral exudate, no LAN, no thyromegaly, TM clear, pupils equal/reactive Cardiac - s1s2 regular, no murmur, pulses symmetric Chest - No wheeze/rales/dullness, decreased BS at Rt base Back - No focal tenderness, kyphotic Abd - Soft, non-tender, no organomegaly, + bowel sounds Ext - No edema Neuro - Normal strength, cranial nerves intact Skin - No rashes Psych - Normal mood, and behavior  CMP Latest Ref Rng 02/18/2014 12/28/2013 08/28/2013  Glucose 70 - 99 mg/dL 96 161(W) 80  BUN 6 - 23 mg/dL 16 13 96(E)  Creatinine 0.40 - 1.20 mg/dL 4.54 0.8 1.0  Sodium 098 - 145 mEq/L 142 141 137  Potassium 3.5 - 5.1 mEq/L 4.8 4.2 4.3  Chloride 96 - 112 mEq/L 108 102 101  CO2 19 - 32 mEq/L 29 31 28   Calcium 8.4 - 10.5 mg/dL 19.7 58.8 32.5  Total Protein 6.0 - 8.3 g/dL 7.0 7.6 7.0  Total Bilirubin 0.2 - 1.2 mg/dL 0.8 0.5 0.6  Alkaline Phos 39 - 117 U/L 85 82 85  AST 0 - 37 U/L 24 22 27   ALT 0 - 35 U/L 16 13 15     CBC Latest Ref Rng 02/18/2014 12/28/2013 08/28/2013  WBC 4.0 - 10.5 K/uL 7.2 7.6 7.6  Hemoglobin 12.0 - 15.0 g/dL 49.8 26.4 15.8  Hematocrit 36.0 - 46.0 % 40.9 43.2 40.9  Platelets 150.0 - 400.0 K/uL 198.0 240.0 219.0    Dg Chest 2 View  04/23/2014   CLINICAL DATA:  Intermittent dyspnea with occasional wheezing  EXAM: CHEST  2 VIEW  COMPARISON:  03/06/2013  FINDINGS: Moderate stable hiatal hernia. Normal heart size and vascular pattern. Elevated right diaphragm. Left anterior chest wall AICD noted. Lungs clear. There is atelectasis above the left-sided hiatal hernia. No pleural effusion.  IMPRESSION: No acute findings.   Electronically Signed   By: Esperanza Heir M.D.    On: 04/23/2014 14:43     Discussion: She has progressive dyspnea, cough, and wheeze.  She has hx of hiatal hernia and reflux, and these could be contributing to her cough.  She has also been started on inhaler therapy and reports symptomatic benefit, so she could have component of asthma.  She has been on ACE inhibitor, but her use of this pre-dates onset of her symptoms by several years.  She also has elevated right diaphragm and kyphosis, and could have restrictive lung defect.  She has nocturnal headaches, and was found to have nocturnal hypoxemia.  This could be related to sleep related hypoventilation in setting of restrictive lung problem, sleep disordered breathing related to systolic heart failure, and/or obstructive sleep apnea.  Assessment/plan:  Laryngopharyngeal reflux in setting of hiatal hernia and GERD. Plan: - will have her try zantac 150 mg nightly  Possible asthma. Plan: - advised her to use Qvar bid - discussed appropriate use of albuterol - she would like to defer pulmonary function testing for now since it wouldn't change management options at this time  Possible restrictive lung defect in setting of elevated Rt hemidiaphragm, hiatal hernia, and kyphosis. Plan: - she would like to defer pulmonary function testing for now since it wouldn't change management options at this time  Possible sleep disordered breathing with nocturnal hypoxia in setting of snoring, possible restrictive lung defect, and systolic heart failure. Plan: - she is agreeable to doing an overnight sleep study to further assess - will then determine if she needs supplemental oxygen +/- PAP therapy    Coralyn Helling, M.D. Pager [phone]

## 2014-06-24 NOTE — Patient Instructions (Signed)
Qvar two puffs twice per day >> rinse mouth after each use Albuterol two puffs every 6 hours as needed for cough, wheeze, or chest congestion Zantac 150 mg pill nightly Will schedule sleep study Follow up with Dr. Craige Cotta or Tammy Parrett in 3 weeks

## 2014-06-24 NOTE — Progress Notes (Signed)
   Subjective:    Patient ID: [name], female    DOB: 1921/01/10, 81 y.o.   MRN: [id number]  HPI    Review of Systems  Constitutional: Positive for fatigue. Negative for fever and unexpected weight change.  HENT: Negative for congestion, dental problem, ear pain, nosebleeds, postnasal drip, rhinorrhea, sinus pressure, sneezing, sore throat and trouble swallowing.   Eyes: Negative for redness and itching.  Respiratory: Positive for shortness of breath. Negative for cough, chest tightness and wheezing.   Cardiovascular: Positive for leg swelling ( left ankle). Negative for palpitations.       Pacemaker and Defib  Gastrointestinal: Negative for nausea and vomiting.  Genitourinary: Negative for dysuria.  Musculoskeletal: Negative for joint swelling.  Skin: Negative for rash.  Neurological: Negative for headaches.  Hematological: Does not bruise/bleed easily.  Psychiatric/Behavioral: Negative for dysphoric mood. The patient is not nervous/anxious.        Objective:   Physical Exam        Assessment & Plan:

## 2014-06-30 ENCOUNTER — Telehealth: Payer: Self-pay

## 2014-06-30 NOTE — Telephone Encounter (Signed)
Bone Density Scan results in Osteoporosis.. Dr.Lowne recommends that the patient continue calcium and vitamin D Since she is on Zantac she may be a candidate for Prolia or Reclast. I spoke with [name] and she agreed to [employer]. I advised I will get the benefit verified and get back to her and she verbalized understanding.  Benefits verified via Prolia Portal.    KP

## 2014-07-06 ENCOUNTER — Encounter: Payer: Self-pay | Admitting: Family Medicine

## 2014-07-07 ENCOUNTER — Telehealth: Payer: Self-pay

## 2014-07-07 DIAGNOSIS — M810 Age-related osteoporosis without current pathological fracture: Secondary | ICD-10-CM

## 2014-07-07 NOTE — Telephone Encounter (Signed)
Prolia Benefits verified and the Benefits are subject to a $166 deductible (met) and the 20% co-insurance for the administration and cost of the Prolia. No PA required.  Secondary Benefits: the provider is in network for this patients plan. The secondary plan follows Medicare guidelines. This plan will consider the Medicare Part-B co-insurance up to primary plans allowable but does not cover the Medicare Part B deductible. Claims will crossover for coordination. No PA required for this plan.     KP   Benefits reviewed with Butch Penny and she verbalized understanding. BMP scheduled for 07/14/14 at 10:30 she will go to [facility] and the Prolia injection is scheduled for 07/21/14 at 2:30pm.      KP

## 2014-07-07 NOTE — Addendum Note (Signed)
Addended by: Arnette NorrisPAYNE, Danney Bungert P on: 07/07/2014 09:39 AM   Modules accepted: Orders

## 2014-07-14 ENCOUNTER — Other Ambulatory Visit: Payer: Medicare Other

## 2014-07-14 ENCOUNTER — Other Ambulatory Visit (INDEPENDENT_AMBULATORY_CARE_PROVIDER_SITE_OTHER): Payer: Medicare Other

## 2014-07-14 DIAGNOSIS — M810 Age-related osteoporosis without current pathological fracture: Secondary | ICD-10-CM

## 2014-07-14 LAB — BASIC METABOLIC PANEL
BUN: 15 mg/dL (ref 6–23)
CHLORIDE: 103 meq/L (ref 96–112)
CO2: 30 meq/L (ref 19–32)
Calcium: 10.4 mg/dL (ref 8.4–10.5)
Creatinine, Ser: 0.65 mg/dL (ref 0.40–1.20)
GFR: 90.77 mL/min (ref >60.00)
Glucose, Bld: 104 mg/dL — ABNORMAL HIGH (ref 70–99)
Potassium: 4.9 mEq/L (ref 3.5–5.1)
Sodium: 140 mEq/L (ref 135–145)

## 2014-07-15 ENCOUNTER — Other Ambulatory Visit: Payer: Self-pay | Admitting: Family Medicine

## 2014-07-16 ENCOUNTER — Ambulatory Visit (INDEPENDENT_AMBULATORY_CARE_PROVIDER_SITE_OTHER): Payer: Medicare Other | Admitting: Adult Health

## 2014-07-16 ENCOUNTER — Encounter: Payer: Self-pay | Admitting: Adult Health

## 2014-07-16 VITALS — BP 116/60 | HR 79 | Temp 97.7°F | Ht 60.0 in | Wt 129.0 lb

## 2014-07-16 DIAGNOSIS — R0600 Dyspnea, unspecified: Secondary | ICD-10-CM | POA: Diagnosis not present

## 2014-07-16 DIAGNOSIS — R05 Cough: Secondary | ICD-10-CM | POA: Diagnosis not present

## 2014-07-16 DIAGNOSIS — R059 Cough, unspecified: Secondary | ICD-10-CM

## 2014-07-16 NOTE — Progress Notes (Signed)
   Subjective:    Patient ID: Laurann MontanaDorothy G Tuohy, female    DOB: Jun 08, 1921, 81 y.o.   MRN: [id number]  HPI 81 year old female seen for pulmonary consult for cough, dyspnea, wheeze, and nocturnal hypoxemia. On 06/24/2014 with Dr. Craige CottaSood.  07/16/2014 follow-up Patient returns for a three-week follow-up visit. Patient was seen last visit for a pulmonary consult for dyspnea, cough and nocturnal hypoxemia. She was set up for a sleep study which is planned for next week. She was started on Qvar. She does feel that this has helped with her breathing some. She was recommended to begin Zantac at bedtime but unfortunately misunderstood this and did not start. She has a history of hiatal hernia. She denies any chest pain, orthopnea, PND, or increased leg swelling.     Review of Systems Constitutional:   No  weight loss, night sweats,  Fevers, chills, + fatigue,   HEENT:   No headaches,  Difficulty swallowing,  Tooth/dental problems, or  Sore throat,                No sneezing, itching, ear ache, nasal congestion, post nasal drip,   CV:  No chest pain,  Orthopnea, PND, swelling in lower extremities, anasarca, dizziness, palpitations, syncope.   GI  No heartburn, indigestion, abdominal pain, nausea, vomiting, diarrhea, change in bowel habits, loss of appetite, bloody stools.   Resp:   No excess mucus, no productive cough,  No non-productive cough,  No coughing up of blood.  No change in color of mucus.  No wheezing.  No chest wall deformity  Skin: no rash or lesions.  GU: no dysuria, change in color of urine, no urgency or frequency.  No flank pain, no hematuria   MS:  No joint pain or swelling.  No decreased range of motion.  No back pain.  Psych:  No change in mood or affect. No depression or anxiety.  No memory loss.         Objective:   Physical Exam GEN: A/Ox3; pleasant , NAD, appears younger than stated age, uses walker   HEENT:  [place]/AT,  EACs-clear, TMs-wnl, NOSE-clear,  THROAT-clear, no lesions, no postnasal drip or exudate noted.   NECK:  Supple w/ fair ROM; no JVD; normal carotid impulses w/o bruits; no thyromegaly or nodules palpated; no lymphadenopathy.  RESP  Clear  P & A; w/o, wheezes/ rales/ or rhonchi.no accessory muscle use, no dullness to percussion  CARD:  RRR, no m/r/g  , tr  peripheral edema, pulses intact, no cyanosis or clubbing.  GI:   Soft & nt; nml bowel sounds; no organomegaly or masses detected.  Musco: Warm bil, no deformities or joint swelling noted.  Arthritic changes of the hand   Neuro: alert, no focal deficits noted.    Skin: Warm, no lesions or rashes         Assessment & Plan:

## 2014-07-16 NOTE — Patient Instructions (Signed)
Continue on Qvar two puffs twice per day >> rinse mouth after each use Albuterol two puffs every 6 hours as needed for cough, wheeze, or chest congestion Add Zantac 150 mg pill nightly Follow up for sleep study as planned next week.  Follow up with Dr. Craige CottaSood in 6 weeks

## 2014-07-16 NOTE — Assessment & Plan Note (Signed)
Mild dyspnea with nocturnal hypoxemia She is to follow-up for her planned sleep study. Next week

## 2014-07-16 NOTE — Progress Notes (Signed)
Reviewed and agree with assessment/plan. 

## 2014-07-16 NOTE — Assessment & Plan Note (Signed)
Cough ? RAD/GERD  Patient is improved on Qvar Would like for her  to add Zantac to prevent any potential reflux.  Plan Continue on Qvar two puffs twice per day >> rinse mouth after each use Albuterol two puffs every 6 hours as needed for cough, wheeze, or chest congestion Add Zantac 150 mg pill nightly Follow up for sleep study as planned next week.  Follow up with Dr. Craige CottaSood in 6 weeks

## 2014-07-21 ENCOUNTER — Ambulatory Visit (INDEPENDENT_AMBULATORY_CARE_PROVIDER_SITE_OTHER): Payer: Medicare Other | Admitting: *Deleted

## 2014-07-21 DIAGNOSIS — M810 Age-related osteoporosis without current pathological fracture: Secondary | ICD-10-CM | POA: Diagnosis not present

## 2014-07-21 MED ORDER — DENOSUMAB 60 MG/ML ~~LOC~~ SOLN
60.0000 mg | Freq: Once | SUBCUTANEOUS | Status: AC
  Administered 2014-07-21: 60 mg via SUBCUTANEOUS

## 2014-07-21 NOTE — Progress Notes (Signed)
Pre visit review using our clinic review tool, if applicable. No additional management support is needed unless otherwise documented below in the visit note.  Patient tolerated injection well.  

## 2014-08-02 ENCOUNTER — Encounter: Payer: Medicare Other | Admitting: *Deleted

## 2014-08-04 ENCOUNTER — Ambulatory Visit (INDEPENDENT_AMBULATORY_CARE_PROVIDER_SITE_OTHER): Payer: Medicare Other | Admitting: Medical

## 2014-08-04 ENCOUNTER — Encounter: Payer: Self-pay | Admitting: Medical

## 2014-08-04 VITALS — BP 149/81 | HR 81 | Temp 98.6°F | Ht 60.0 in | Wt 128.8 lb

## 2014-08-04 DIAGNOSIS — W57XXXA Bitten or stung by nonvenomous insect and other nonvenomous arthropods, initial encounter: Secondary | ICD-10-CM

## 2014-08-04 DIAGNOSIS — T148 Other injury of unspecified body region: Secondary | ICD-10-CM | POA: Diagnosis not present

## 2014-08-04 MED ORDER — DOXYCYCLINE HYCLATE 100 MG PO TABS: 100.0000 mg | ORAL_TABLET | Freq: Two times a day (BID) | ORAL | Status: DC

## 2014-08-04 NOTE — Progress Notes (Signed)
Pre visit review using our clinic review tool, if applicable. No additional management support is needed unless otherwise documented below in the visit note. 

## 2014-08-04 NOTE — Progress Notes (Signed)
Subjective:    Patient ID: [name], female    DOB: 1921-07-24, 81 y.o.   MRN: [id number]  HPI  Pt in for tick bite. Pt felt small bump on her left trapezius that itched just a little bit. No fever. No chills. No body aches(other than old age aches) or fatigued. Pt not sure how long tick has been attached.     Review of Systems  Constitutional: Negative for fever, chills and fatigue.  Respiratory: Negative for cough, chest tightness, shortness of breath and wheezing.   Cardiovascular: Negative for chest pain and palpitations.  Musculoskeletal: Negative for myalgias and joint swelling.  Skin:       Lt trapezius tick bite.  Neurological: Negative for dizziness, weakness and headaches.  Hematological: Negative for adenopathy. Does not bruise/bleed easily.     Past Medical History  Diagnosis Date  . Chest pain 12/09    recent hospitalization for it. felt to be noncardiac.   [name] CAD (coronary artery disease)     a. s/p prior anterior wall myocardial infarction and multiple percutaneous coronary intervention.;  b. [place] MV 1/14:  low risk, inf and apical defect likely due to atten artifact, no ischemia, EF 35% (visually normal and est 55-60%)  . Ischemic cardiomyopathy     EF 25-30% improved to 45% with biventricular pacing.   . Systolic heart failure     improved to class I to II   . Automatic implantable cardioverter/defibrillator (AICD) activation     s/p implantable cardioverter-defibrillator bivenrticular pacer. 12/05. [employer], [employer]. remote-no.  [name] HTN (hypertension)   . Hyperlipidemia   . GERD (gastroesophageal reflux disease)   . Esophageal stricture     last dialated 2007  . Hiatal hernia   . DD (diverticular disease)     Hx of it - severe. Left colon 2004  . Other urinary problems     urinary incontenance  . UTI (lower urinary tract infection)     recurrent. (Klebsiella pneumoniae). Last Cx 02/03/09. (R-ancef, nitrofurantoin, Augmentin, Zyosyn)  . Arthritis     . PVT (paroxysmal ventricular tachycardia)   . CHF (congestive heart failure)   . Hypothyroidism   . Anxiety     sleep  . Pacemaker   . Automatic implantable cardioverter-defibrillator in situ   . Heart murmur   . History of recurrent UTIs   . Shortness of breath     with exertion  . Urgency of urination   . Frequent urination at night     History   Social History  . Marital Status: Widowed    Spouse Name: N/A  . Number of Children: N/A  . Years of Education: N/A   Occupational History  . Retired    Social History Main Topics  . Smoking status: Never Smoker   . Smokeless tobacco: Never Used  . Alcohol Use: No  . Drug Use: No  . Sexual Activity: Not on file   Other Topics Concern  . Not on file   Social History Narrative   Widowed, husband died of brain tumor. Son lives in [place], [place]- was in a MVA for DWI. He has had a long hx of alcohol problems, she has helped him out financially in the past but is ready to be much stricter. Pt. Has a daughter who live in [place] with Breast Ca. She also lost a son to a brain tumor.     Past Surgical History  Procedure Laterality Date  . Angioplasty  stent  . Unspecified area hysterectomy    . Tubal ligation    . Appendectomy    . Cardiac defibrillator placement      [facility]  . Cataract extraction, bilateral    . Total knee arthroplasty      bilateral  . Tonsillectomy    . Hammertoe reconstruction with weil osteotomy Right 07/17/2012    Procedure: RIGHT SECOND AND THIRD MT WEIL OSTEOTOMIES AND SECOND AND THIRD HAMMERTOE CORRECTIONS;  Surgeon: Toni Arthurs, MD;  Location: [facility] OR;  Service: Orthopedics;  Laterality: Right;  . Colonoscopy w/ polypectomy    . Cardiac catheterization      stent  . Insert / replace / remove pacemaker    . Hammer toe surgery Left 01/15/2013    Procedure: LEFT SECOND THROUGH FOURTH HAMMERTOE CORRECTION ;  Surgeon: Toni Arthurs, MD;  Location: [facility] OR;  Service: Orthopedics;  Laterality: Left;  .  Weil osteotomy Left 01/15/2013    Procedure:  WEIL OSTEOTOMY AND DORSAL CAPSULOTOMY;  Surgeon: Toni Arthurs, MD;  Location: [facility] OR;  Service: Orthopedics;  Laterality: Left;    Family History  Problem Relation Age of Onset  . Colon cancer Neg Hx   . Colon polyps Sister   . Heart disease Mother   . Heart disease Father   . Heart disease Brother     multiple brothers  . Heart disease Sister     multiple sisters  . Prostate cancer Brother   . Diabetes Daughter   . Diabetes      aunt  . Breast cancer Daughter   . Alcohol abuse Son   . Brain cancer Son   . Lung cancer Son     Allergies  Allergen Reactions  . Sulfonamide Derivatives     REACTION: rash and itching    Current Outpatient Prescriptions on File Prior to Visit  Medication Sig Dispense Refill  . acetaminophen-codeine (TYLENOL #3) 300-30 MG per tablet Take 1 tablet PRN pain (dental)  0  . albuterol (PROVENTIL HFA;VENTOLIN HFA) 108 (90 BASE) MCG/ACT inhaler Inhale 2 puffs into the lungs every 6 (six) hours as needed for wheezing or shortness of breath. 1 Inhaler 1  . aspirin (ASPIR-81) 81 MG EC tablet Take 81 mg by mouth daily.      . beclomethasone (QVAR) 40 MCG/ACT inhaler Inhale 2 puffs into the lungs 2 (two) times daily. 1 Inhaler 2  . furosemide (LASIX) 20 MG tablet Take 1 tablet (20 mg total) by mouth daily as needed for fluid or edema. 90 tablet 1  . isosorbide mononitrate (IMDUR) 30 MG 24 hr tablet TAKE 1 AND 1/2 TABLETS EVERY MORNING 45 tablet 6  . levothyroxine (SYNTHROID, LEVOTHROID) 100 MCG tablet Take 1 tablet (100 mcg total) by mouth daily. 90 tablet 1  . metoprolol succinate (TOPROL-XL) 50 MG 24 hr tablet TAKE 1 TABLET (50 MG TOTAL) BY MOUTH 2 (TWO) TIMES DAILY. TAKE WITH OR IMMEDIATELY FOLLOWING A MEAL. 60 tablet 3  . nitroGLYCERIN (NITROSTAT) 0.4 MG SL tablet Place 1 tablet (0.4 mg total) under the tongue every 5 (five) minutes as needed. At the onset of chest pain, Up to 3 doses 10 tablet 0  . pravastatin  (PRAVACHOL) 20 MG tablet TAKE 1 TABLET (20 MG TOTAL) BY MOUTH AT BEDTIME. 90 tablet 1  . Psyllium (METAFIBER) 48.57 % POWD Take 10 mLs by mouth every evening.     . ramipril (ALTACE) 5 MG capsule Take 1 capsule (5 mg total) by mouth daily. 30  capsule 6  . ranitidine (ZANTAC) 150 MG tablet Take 1 tablet (150 mg total) by mouth daily. 30 tablet 3   No current facility-administered medications on file prior to visit.    BP 149/81 mmHg  Pulse 81  Temp(Src) 98.6 F (37 C) (Oral)  Ht 5' (1.524 m)  Wt 128 lb 12.8 oz (58.423 kg)  BMI 25.15 kg/m2       Objective:   Physical Exam  General- No acute distress. Pleasant patient. Neck- Full range of motion, no jvd Lungs- Clear, even and unlabored. Heart- regular rate and rhythm. Neurologic- CNII- XII grossly intact.  Skin- left trapezius. Very tiny tick attached. I did pull tick off and attempted to make sure head removed from epidermis as well. Believe was successful.       Assessment & Plan:  Rx doxycycline. Rx advisement given regarding eating and photosensitivity.  Tick bite labs today.  Watch site for any worsening signs or symptoms. Or if systemic symptoms notify us as well.  Follow up in 10 days or as

## 2014-08-04 NOTE — Patient Instructions (Addendum)
Rx doxycycline. Rx advisement given regarding eating and photosensitivity.  Tick bite labs today.  Watch site for any worsening signs or symptoms. Or if systemic symptoms notify us as well.  Follow up in 10 days or as needed

## 2014-08-06 ENCOUNTER — Other Ambulatory Visit: Payer: Self-pay

## 2014-08-06 LAB — ROCKY MTN SPOTTED FVR ABS PNL(IGG+IGM)
RMSF IGG: 0.6 IV
RMSF IgM: 0.2 IV

## 2014-08-06 LAB — LYME ABY, WSTRN BLT IGG & IGM W/BANDS
B BURGDORFERI IGM ABS (IB): NEGATIVE
B burgdorferi IgG Abs (IB): NEGATIVE
LYME DISEASE 18 KD IGG: NONREACTIVE
LYME DISEASE 23 KD IGM: NONREACTIVE
LYME DISEASE 39 KD IGM: NONREACTIVE
LYME DISEASE 41 KD IGG: NONREACTIVE
LYME DISEASE 41 KD IGM: NONREACTIVE
LYME DISEASE 66 KD IGG: NONREACTIVE
Lyme Disease 23 kD IgG: NONREACTIVE
Lyme Disease 28 kD IgG: NONREACTIVE
Lyme Disease 30 kD IgG: NONREACTIVE
Lyme Disease 39 kD IgG: NONREACTIVE
Lyme Disease 45 kD IgG: NONREACTIVE
Lyme Disease 58 kD IgG: REACTIVE — AB
Lyme Disease 93 kD IgG: NONREACTIVE

## 2014-08-06 MED ORDER — PRAVASTATIN SODIUM 20 MG PO TABS: ORAL_TABLET | ORAL | Status: DC

## 2014-08-11 ENCOUNTER — Encounter: Payer: Self-pay | Admitting: Medical

## 2014-08-11 ENCOUNTER — Ambulatory Visit (INDEPENDENT_AMBULATORY_CARE_PROVIDER_SITE_OTHER): Payer: Medicare Other | Admitting: Medical

## 2014-08-11 VITALS — BP 145/82 | HR 81 | Temp 98.2°F | Ht 60.0 in | Wt 128.6 lb

## 2014-08-11 DIAGNOSIS — R6883 Chills (without fever): Secondary | ICD-10-CM

## 2014-08-11 DIAGNOSIS — M7021 Olecranon bursitis, right elbow: Secondary | ICD-10-CM | POA: Diagnosis not present

## 2014-08-11 DIAGNOSIS — T148 Other injury of unspecified body region: Secondary | ICD-10-CM

## 2014-08-11 DIAGNOSIS — W57XXXA Bitten or stung by nonvenomous insect and other nonvenomous arthropods, initial encounter: Secondary | ICD-10-CM

## 2014-08-11 DIAGNOSIS — T148XXA Other injury of unspecified body region, initial encounter: Secondary | ICD-10-CM

## 2014-08-11 LAB — CBC WITH DIFFERENTIAL/PLATELET
BASOS ABS: 0 10*3/uL (ref 0.0–0.1)
Basophils Relative: 0.6 % (ref 0.0–3.0)
EOS PCT: 5.7 % — AB (ref 0.0–5.0)
Eosinophils Absolute: 0.4 10*3/uL (ref 0.0–0.7)
HCT: 39.9 % (ref 36.0–46.0)
Hemoglobin: 13.4 g/dL (ref 12.0–15.0)
LYMPHS PCT: 30.7 % (ref 12.0–46.0)
Lymphs Abs: 2.2 10*3/uL (ref 0.7–4.0)
MCHC: 33.7 g/dL (ref 30.0–36.0)
MCV: 91.6 fl (ref 78.0–100.0)
Monocytes Absolute: 0.7 10*3/uL (ref 0.1–1.0)
Monocytes Relative: 10.2 % (ref 3.0–12.0)
NEUTROS PCT: 52.8 % (ref 43.0–77.0)
Neutro Abs: 3.7 10*3/uL (ref 1.4–7.7)
PLATELETS: 207 10*3/uL (ref 150.0–400.0)
RBC: 4.35 Mil/uL (ref 3.87–5.11)
RDW: 13.5 % (ref 11.5–15.5)
WBC: 7 10*3/uL (ref 4.0–10.5)

## 2014-08-11 MED ORDER — DOXYCYCLINE HYCLATE 100 MG PO TABS: 100.0000 mg | ORAL_TABLET | Freq: Two times a day (BID) | ORAL | Status: DC

## 2014-08-11 NOTE — Patient Instructions (Signed)
For bursitis apply ice twice daily and can use compression wrap to area. Tylenol if needed. Will refer to Dr. Malka So sports med but for when get back from vacation.  For abrasion from dog scratch. Continue neosprorin.(Pt sure not a bite)  For tick bite continue the doxycycline until finish full coarse.  For chills yesterday do want to get a cbc today.  If while on vacation your get redness or swelling olecranon area, dog sratch area or at tick bite area then start 2nd rx of doxycycline. Otherwise would not use.   Follow up in 3 weeks or as needed

## 2014-08-11 NOTE — Progress Notes (Signed)
Subjective:    Patient ID: [name], female    DOB: [birth year], 81 y.o.   MRN: [id number]  HPI  Pt has rt elbow swelling for 3-4 weeks. Pt states no fall or trauma but states uses her elbows in bed when changing positions. No pain.   Pt also states her door scratched her mid forearm. Scratched with paw. This ws 3-4 days ago.  Pt states some chills all day yesterday.But none today. Pt just finished doxycycline for her tick bite.   Review of Systems  Constitutional: Positive for chills. Negative for fever, diaphoresis and fatigue.  HENT: Negative.   Respiratory: Negative for cough, choking and wheezing.   Cardiovascular: Negative for chest pain and palpitations.  Genitourinary: Negative for dysuria, urgency, hematuria, flank pain, pelvic pain and dyspareunia.  Musculoskeletal: Negative for back pain.       Elbow swollen rt side olecranon.  Skin:       Prior tick bite area looks ok per pt. Not tender.  Hematological: Negative for adenopathy. Does not bruise/bleed easily.     Past Medical History  Diagnosis Date  . Chest pain 12/09    recent hospitalization for it. felt to be noncardiac.   [name] CAD (coronary artery disease)     a. s/p prior anterior wall myocardial infarction and multiple percutaneous coronary intervention.;  b. Aden MV 1/14:  low risk, inf and apical defect likely due to atten artifact, no ischemia, EF 35% (visually normal and est 55-60%)  . Ischemic cardiomyopathy     EF 25-30% improved to 45% with biventricular pacing.   . Systolic heart failure     improved to class I to II   . Automatic implantable cardioverter/defibrillator (AICD) activation     s/p implantable cardioverter-defibrillator bivenrticular pacer. 12/05. St, Jude Medtronic. remote-no.  [name] HTN (hypertension)   . Hyperlipidemia   . GERD (gastroesophageal reflux disease)   . Esophageal stricture     last dialated 2007  . Hiatal hernia   . DD (diverticular disease)     Hx of it - severe. Left  colon 2004  . Other urinary problems     urinary incontenance  . UTI (lower urinary tract infection)     recurrent. (Klebsiella pneumoniae). Last Cx 02/03/09. (R-ancef, nitrofurantoin, Augmentin, Zyosyn)  . Arthritis   . PVT (paroxysmal ventricular tachycardia)   . CHF (congestive heart failure)   . Hypothyroidism   . Anxiety     sleep  . Pacemaker   . Automatic implantable cardioverter-defibrillator in situ   . Heart murmur   . History of recurrent UTIs   . Shortness of breath     with exertion  . Urgency of urination   . Frequent urination at night     History   Social History  . Marital Status: Widowed    Spouse Name: N/A  . Number of Children: N/A  . Years of Education: N/A   Occupational History  . Retired    Social History Main Topics  . Smoking status: Never Smoker   . Smokeless tobacco: Never Used  . Alcohol Use: No  . Drug Use: No  . Sexual Activity: Not on file   Other Topics Concern  . Not on file   Social History Narrative   Widowed, husband died of brain tumor. Son lives in [place], [place]- was in a MVA for DWI. He has had a long hx of alcohol problems, she has helped him out financially in the past but is  ready to be much stricter. Pt. Has a daughter who live in [place] with Breast Ca. She also lost a son to a brain tumor.     Past Surgical History  Procedure Laterality Date  . Angioplasty      stent  . Unspecified area hysterectomy    . Tubal ligation    . Appendectomy    . Cardiac defibrillator placement      [facility]  . Cataract extraction, bilateral    . Total knee arthroplasty      bilateral  . Tonsillectomy    . Hammertoe reconstruction with weil osteotomy Right 07/17/2012    Procedure: RIGHT SECOND AND THIRD MT WEIL OSTEOTOMIES AND SECOND AND THIRD HAMMERTOE CORRECTIONS;  Surgeon: Toni Arthurs, MD;  [facility]: [facility];  Service: Orthopedics;  Laterality: Right;  . Colonoscopy w/ polypectomy    . Cardiac catheterization      stent  . Insert /  replace / remove pacemaker    . Hammer toe surgery Left 01/15/2013    Procedure: LEFT SECOND THROUGH FOURTH HAMMERTOE CORRECTION ;  Surgeon: Toni Arthurs, MD;  Location: [facility];  Service: Orthopedics;  Laterality: Left;  . Weil osteotomy Left 01/15/2013    Procedure:  WEIL OSTEOTOMY AND DORSAL CAPSULOTOMY;  Surgeon: Toni Arthurs, MD;  [facility]: [facility];  Service: Orthopedics;  Laterality: Left;    Family History  Problem Relation Age of Onset  . Colon cancer Neg Hx   . Colon polyps Sister   . Heart disease Mother   . Heart disease Father   . Heart disease Brother     multiple brothers  . Heart disease Sister     multiple sisters  . Prostate cancer Brother   . Diabetes Daughter   . Diabetes      aunt  . Breast cancer Daughter   . Alcohol abuse Son   . Brain cancer Son   . Lung cancer Son     Allergies  Allergen Reactions  . Sulfonamide Derivatives     REACTION: rash and itching    Current Outpatient Prescriptions on File Prior to Visit  Medication Sig Dispense Refill  . acetaminophen-codeine (TYLENOL #3) 300-30 MG per tablet Take 1 tablet PRN pain (dental)  0  . albuterol (PROVENTIL HFA;VENTOLIN HFA) 108 (90 BASE) MCG/ACT inhaler Inhale 2 puffs into the lungs every 6 (six) hours as needed for wheezing or shortness of breath. 1 Inhaler 1  . aspirin (ASPIR-81) 81 MG EC tablet Take 81 mg by mouth daily.      . beclomethasone (QVAR) 40 MCG/ACT inhaler Inhale 2 puffs into the lungs 2 (two) times daily. 1 Inhaler 2  . doxycycline (VIBRA-TABS) 100 MG tablet Take 1 tablet (100 mg total) by mouth 2 (two) times daily. 20 tablet 0  . furosemide (LASIX) 20 MG tablet Take 1 tablet (20 mg total) by mouth daily as needed for fluid or edema. 90 tablet 1  . isosorbide mononitrate (IMDUR) 30 MG 24 hr tablet TAKE 1 AND 1/2 TABLETS EVERY MORNING 45 tablet 6  . levothyroxine (SYNTHROID, LEVOTHROID) 100 MCG tablet Take 1 tablet (100 mcg total) by mouth daily. 90 tablet 1  . metoprolol succinate  (TOPROL-XL) 50 MG 24 hr tablet TAKE 1 TABLET (50 MG TOTAL) BY MOUTH 2 (TWO) TIMES DAILY. TAKE WITH OR IMMEDIATELY FOLLOWING A MEAL. 60 tablet 3  . nitroGLYCERIN (NITROSTAT) 0.4 MG SL tablet Place 1 tablet (0.4 mg total) under the tongue every 5 (five) minutes as needed. At  the onset of chest pain, Up to 3 doses 10 tablet 0  . pravastatin (PRAVACHOL) 20 MG tablet TAKE 1 TABLET (20 MG TOTAL) BY MOUTH AT BEDTIME. REPEAT LABS ARE DUE NOW 90 tablet 0  . Psyllium (METAFIBER) 48.57 % POWD Take 10 mLs by mouth every evening.     . ramipril (ALTACE) 5 MG capsule Take 1 capsule (5 mg total) by mouth daily. 30 capsule 6  . ranitidine (ZANTAC) 150 MG tablet Take 1 tablet (150 mg total) by mouth daily. 30 tablet 3   No current facility-administered medications on file prior to visit.    BP 145/82 mmHg  Pulse 81  Temp(Src) 98.2 F (36.8 C) (Oral)  Ht 5' (1.524 m)  Wt 128 lb 9.6 oz (58.333 kg)  BMI 25.12 kg/m2  SpO2 96%       Objective:   Physical Exam  General- No acute distress. Pleasant patient. Neck- Full range of motion, no jvd Lungs- Clear, even and unlabored. Heart- regular rate and rhythm. Neurologic- CNII- XII grossly intact.  Rt elbow- moderate sized olecranon bursitis. The area is not warm to touch.  Small abrasion to rt forearm. Healing. Triangle shaped abrasion.  Lt side of neck- faint mild red area at base where tick was attached.Better appearance than on last visit.     Assessment & Plan:  For bursitis apply ice twice daily and can use compression wrap to area. Tylenol if needed. Will refer to Dr. Malka So sports med but for when get back from vacation.  For abrasion from dog scratch. Continue neosprorin.(Pt sure not a bite)  For tick bite continue the doxycycline until finish full coarse.  For chills yesterday do want to get a cbc today.  If while on vacation your get redness or swelling olecranon area, dog sratch area or at tick bite area then start 2nd rx of  doxycycline. Otherwise would not use.   Follow up in 3 weeks or as needed

## 2014-08-11 NOTE — Progress Notes (Signed)
Pre visit review using our clinic review tool, if applicable. No additional management support is needed unless otherwise documented below in the visit note. 

## 2014-08-12 ENCOUNTER — Ambulatory Visit: Payer: Medicare Other | Admitting: Family Medicine

## 2014-08-12 ENCOUNTER — Ambulatory Visit (INDEPENDENT_AMBULATORY_CARE_PROVIDER_SITE_OTHER): Payer: Medicare Other | Admitting: Family Medicine

## 2014-08-12 VITALS — BP 155/87 | HR 93 | Ht 60.0 in | Wt 128.0 lb

## 2014-08-12 DIAGNOSIS — M7021 Olecranon bursitis, right elbow: Secondary | ICD-10-CM

## 2014-08-12 DIAGNOSIS — M25521 Pain in right elbow: Secondary | ICD-10-CM

## 2014-08-12 MED ORDER — METHYLPREDNISOLONE ACETATE 40 MG/ML IJ SUSP
40.0000 mg | Freq: Once | INTRAMUSCULAR | Status: AC
  Administered 2014-08-12: 40 mg via INTRA_ARTICULAR

## 2014-08-12 NOTE — Patient Instructions (Signed)
You have olecranon bursitis. Ice the area 3-4 times a day for 15 minutes at a time Compression wrap to keep swelling down for next 1-2 weeks. Consider aspiration and injection of the area as well - we did this today. Consider elbow pad for protection to prevent irritation and additional swelling. Follow up with me as needed.

## 2014-08-13 DIAGNOSIS — M7021 Olecranon bursitis, right elbow: Secondary | ICD-10-CM | POA: Insufficient documentation

## 2014-08-13 NOTE — Progress Notes (Signed)
PCP: Loreen Freud, DO Referred by: Esperanza Richters PA  Subjective:   HPI: Patient is a 81 y.o. female here for right elbow pain, swelling.  Patient denies known injury or trauma. Has noticed a lot of swelling, some pain with pressure over about the past month. No issues with left side. No redness, fevers, chills, sweats. Does roll onto elbows when in bed.  Past Medical History  Diagnosis Date  . Chest pain 12/09    recent hospitalization for it. felt to be noncardiac.   [name] CAD (coronary artery disease)     a. s/p prior anterior wall myocardial infarction and multiple percutaneous coronary intervention.;  b. Aden MV 1/14:  low risk, inf and apical defect likely due to atten artifact, no ischemia, EF 35% (visually normal and est 55-60%)  . Ischemic cardiomyopathy     EF 25-30% improved to 45% with biventricular pacing.   . Systolic heart failure     improved to class I to II   . Automatic implantable cardioverter/defibrillator (AICD) activation     s/p implantable cardioverter-defibrillator bivenrticular pacer. 12/05. St, Jude Medtronic. remote-no.  [name] HTN (hypertension)   . Hyperlipidemia   . GERD (gastroesophageal reflux disease)   . Esophageal stricture     last dialated 2007  . Hiatal hernia   . DD (diverticular disease)     Hx of it - severe. Left colon 2004  . Other urinary problems     urinary incontenance  . UTI (lower urinary tract infection)     recurrent. (Klebsiella pneumoniae). Last Cx 02/03/09. (R-ancef, nitrofurantoin, Augmentin, Zyosyn)  . Arthritis   . PVT (paroxysmal ventricular tachycardia)   . CHF (congestive heart failure)   . Hypothyroidism   . Anxiety     sleep  . Pacemaker   . Automatic implantable cardioverter-defibrillator in situ   . Heart murmur   . History of recurrent UTIs   . Shortness of breath     with exertion  . Urgency of urination   . Frequent urination at night     Current Outpatient Prescriptions on File Prior to Visit  Medication  Sig Dispense Refill  . acetaminophen-codeine (TYLENOL #3) 300-30 MG per tablet Take 1 tablet PRN pain (dental)  0  . albuterol (PROVENTIL HFA;VENTOLIN HFA) 108 (90 BASE) MCG/ACT inhaler Inhale 2 puffs into the lungs every 6 (six) hours as needed for wheezing or shortness of breath. 1 Inhaler 1  . aspirin (ASPIR-81) 81 MG EC tablet Take 81 mg by mouth daily.      . beclomethasone (QVAR) 40 MCG/ACT inhaler Inhale 2 puffs into the lungs 2 (two) times daily. 1 Inhaler 2  . doxycycline (VIBRA-TABS) 100 MG tablet Take 1 tablet (100 mg total) by mouth 2 (two) times daily. 20 tablet 0  . furosemide (LASIX) 20 MG tablet Take 1 tablet (20 mg total) by mouth daily as needed for fluid or edema. 90 tablet 1  . isosorbide mononitrate (IMDUR) 30 MG 24 hr tablet TAKE 1 AND 1/2 TABLETS EVERY MORNING 45 tablet 6  . levothyroxine (SYNTHROID, LEVOTHROID) 100 MCG tablet Take 1 tablet (100 mcg total) by mouth daily. 90 tablet 1  . metoprolol succinate (TOPROL-XL) 50 MG 24 hr tablet TAKE 1 TABLET (50 MG TOTAL) BY MOUTH 2 (TWO) TIMES DAILY. TAKE WITH OR IMMEDIATELY FOLLOWING A MEAL. 60 tablet 3  . nitroGLYCERIN (NITROSTAT) 0.4 MG SL tablet Place 1 tablet (0.4 mg total) under the tongue every 5 (five) minutes as needed. At the onset of  chest pain, Up to 3 doses 10 tablet 0  . pravastatin (PRAVACHOL) 20 MG tablet TAKE 1 TABLET (20 MG TOTAL) BY MOUTH AT BEDTIME. REPEAT LABS ARE DUE NOW 90 tablet 0  . Psyllium (METAFIBER) 48.57 % POWD Take 10 mLs by mouth every evening.     . ramipril (ALTACE) 5 MG capsule Take 1 capsule (5 mg total) by mouth daily. 30 capsule 6  . ranitidine (ZANTAC) 150 MG tablet Take 1 tablet (150 mg total) by mouth daily. 30 tablet 3   No current facility-administered medications on file prior to visit.    Past Surgical History  Procedure Laterality Date  . Angioplasty      stent  . Unspecified area hysterectomy    . Tubal ligation    . Appendectomy    . Cardiac defibrillator placement      [facility]  . Cataract extraction, bilateral    . Total knee arthroplasty      bilateral  . Tonsillectomy    . Hammertoe reconstruction with weil osteotomy Right 07/17/2012    Procedure: RIGHT SECOND AND THIRD MT WEIL OSTEOTOMIES AND SECOND AND THIRD HAMMERTOE CORRECTIONS;  Surgeon: Toni Arthurs, MD;  Location: [facility] OR;  Service: Orthopedics;  Laterality: Right;  . Colonoscopy w/ polypectomy    . Cardiac catheterization      stent  . Insert / replace / remove pacemaker    . Hammer toe surgery Left 01/15/2013    Procedure: LEFT SECOND THROUGH FOURTH HAMMERTOE CORRECTION ;  Surgeon: Toni Arthurs, MD;  Location: [facility];  Service: Orthopedics;  Laterality: Left;  . Weil osteotomy Left 01/15/2013    Procedure:  WEIL OSTEOTOMY AND DORSAL CAPSULOTOMY;  Surgeon: Toni Arthurs, MD;  Location: [facility];  Service: Orthopedics;  Laterality: Left;    Allergies  Allergen Reactions  . Sulfonamide Derivatives     REACTION: rash and itching    History   Social History  . Marital Status: Widowed    Spouse Name: N/A  . Number of Children: N/A  . Years of Education: N/A   Occupational History  . Retired    Social History Main Topics  . Smoking status: Never Smoker   . Smokeless tobacco: Never Used  . Alcohol Use: No  . Drug Use: No  . Sexual Activity: Not on file   Other Topics Concern  . Not on file   Social History Narrative   Widowed, husband died of brain tumor. Son lives in [place], [place]- was in a MVA for DWI. He has had a long hx of alcohol problems, she has helped him out financially in the past but is ready to be much stricter. Pt. Has a daughter who live in [place] with Breast Ca. She also lost a son to a brain tumor.     Family History  Problem Relation Age of Onset  . Colon cancer Neg Hx   . Colon polyps Sister   . Heart disease Mother   . Heart disease Father   . Heart disease Brother     multiple brothers  . Heart disease Sister     multiple sisters  . Prostate cancer Brother   .  Diabetes Daughter   . Diabetes      aunt  . Breast cancer Daughter   . Alcohol abuse Son   . Brain cancer Son   . Lung cancer Son     BP 155/87 mmHg  Pulse 93  Ht 5' (1.524 m)  Wt 128  lb (58.06 kg)  BMI 25.00 kg/m2  Review of Systems: See HPI above.    Objective:  Physical Exam:  Gen: NAD  Right elbow: Obvious olecranon bursitis, large amount of swelling.  No bruising, erythema.  Mild warmth. FROM without pain. Mild tenderness to touch of bursa. Collateral ligaments intact. NVI distally.    Assessment & Plan:  1. Right olecranon bursitis - Discussed options as well as risks/benefits of aspiration/injection - she would like to go ahead with this.  Compression sleeve, icing, elevation above heart to prevent this from recurring as well.  Follow up as needed.  After informed written consent patient was seated in chair in exam room.  Area overlying right olecranon bursa prepped with alcohol swab.  1mL marcaine was used for local anesthesia.  Then using an 18g needle on 60cc syringe, 17 mL of clear straw-colored fluid was aspirated from right olecranon bursa then this was injected with 1:1 marcaine:depomedrol.  Patient tolerated procedure well without immediate complications

## 2014-08-13 NOTE — Assessment & Plan Note (Signed)
Right olecranon bursitis - Discussed options as well as risks/benefits of aspiration/injection - she would like to go ahead with this.  Compression sleeve, icing, elevation above heart to prevent this from recurring as well.  Follow up as needed.  After informed written consent patient was seated in chair in exam room.  Area overlying right olecranon bursa prepped with alcohol swab.  1mL marcaine was used for local anesthesia.  Then using an 18g needle on 60cc syringe, 17 mL of clear straw-colored fluid was aspirated from right olecranon bursa then this was injected with 1:1 marcaine:depomedrol.  Patient tolerated procedure well without immediate complications

## 2014-08-18 ENCOUNTER — Encounter: Payer: Self-pay | Admitting: *Deleted

## 2014-08-23 ENCOUNTER — Encounter: Payer: Medicare Other | Admitting: Medical

## 2014-08-23 NOTE — Progress Notes (Signed)
This encounter was created in error - please disregard.

## 2014-08-25 ENCOUNTER — Other Ambulatory Visit: Payer: Self-pay | Admitting: Family Medicine

## 2014-08-25 ENCOUNTER — Encounter: Payer: Self-pay | Admitting: Medical

## 2014-08-25 ENCOUNTER — Ambulatory Visit (INDEPENDENT_AMBULATORY_CARE_PROVIDER_SITE_OTHER): Payer: Medicare Other | Admitting: Medical

## 2014-08-25 VITALS — BP 130/70 | HR 83 | Temp 97.7°F | Ht 60.0 in | Wt 129.0 lb

## 2014-08-25 DIAGNOSIS — M7021 Olecranon bursitis, right elbow: Secondary | ICD-10-CM | POA: Diagnosis not present

## 2014-08-25 DIAGNOSIS — T148 Other injury of unspecified body region: Secondary | ICD-10-CM

## 2014-08-25 DIAGNOSIS — W57XXXA Bitten or stung by nonvenomous insect and other nonvenomous arthropods, initial encounter: Secondary | ICD-10-CM

## 2014-08-25 NOTE — Progress Notes (Signed)
Subjective:    Patient ID: [name], female    DOB: [birth year], 81 y.o.   MRN: [id number]  HPI  Pt in states her elbow is better. She got cortisone injection and took fluid off. Her rt elbow has no symptoms. No recurrent swelling.  Pt left clavicle area tick bite looks good. Pt states area is not tender. No joint pain or arthralgias. Pt had some chills last time. I instructed to continue the doxycycline until finished. No recurrent chills since then.  Pt updates me that she is about to get sleep study done.    Review of Systems  Constitutional: Negative for fever, chills and fatigue.  Respiratory: Negative for cough, chest tightness, shortness of breath and wheezing.   Cardiovascular: Negative for chest pain and palpitations.  Musculoskeletal: Negative for back pain.  Neurological: Negative for dizziness, seizures, syncope, speech difficulty, weakness and headaches.  Hematological: Negative for adenopathy. Does not bruise/bleed easily.  Psychiatric/Behavioral: Negative for behavioral problems and confusion.     Past Medical History  Diagnosis Date  . Chest pain 12/09    recent hospitalization for it. felt to be noncardiac.   [name] CAD (coronary artery disease)     a. s/p prior anterior wall myocardial infarction and multiple percutaneous coronary intervention.;  b. Aden MV 1/14:  low risk, inf and apical defect likely due to atten artifact, no ischemia, EF 35% (visually normal and est 55-60%)  . Ischemic cardiomyopathy     EF 25-30% improved to 45% with biventricular pacing.   . Systolic heart failure     improved to class I to II   . Automatic implantable cardioverter/defibrillator (AICD) activation     s/p implantable cardioverter-defibrillator bivenrticular pacer. 12/05. St, Jude Medtronic. remote-no.  [name] HTN (hypertension)   . Hyperlipidemia   . GERD (gastroesophageal reflux disease)   . Esophageal stricture     last dialated 2007  . Hiatal hernia   . DD (diverticular  disease)     Hx of it - severe. Left colon 2004  . Other urinary problems     urinary incontenance  . UTI (lower urinary tract infection)     recurrent. (Klebsiella pneumoniae). Last Cx 02/03/09. (R-ancef, nitrofurantoin, Augmentin, Zyosyn)  . Arthritis   . PVT (paroxysmal ventricular tachycardia)   . CHF (congestive heart failure)   . Hypothyroidism   . Anxiety     sleep  . Pacemaker   . Automatic implantable cardioverter-defibrillator in situ   . Heart murmur   . History of recurrent UTIs   . Shortness of breath     with exertion  . Urgency of urination   . Frequent urination at night     Social History   Social History  . Marital Status: Widowed    Spouse Name: N/A  . Number of Children: N/A  . Years of Education: N/A   Occupational History  . Retired    Social History Main Topics  . Smoking status: Never Smoker   . Smokeless tobacco: Never Used  . Alcohol Use: No  . Drug Use: No  . Sexual Activity: Not on file   Other Topics Concern  . Not on file   Social History Narrative   Widowed, husband died of brain tumor. Son lives in [place], [place]- was in a MVA for DWI. He has had a long hx of alcohol problems, she has helped him out financially in the past but is ready to be much stricter. Pt. Has a daughter who  live in [place] with Breast Ca. She also lost a son to a brain tumor.     Past Surgical History  Procedure Laterality Date  . Angioplasty      stent  . Unspecified area hysterectomy    . Tubal ligation    . Appendectomy    . Cardiac defibrillator placement      [facility]  . Cataract extraction, bilateral    . Total knee arthroplasty      bilateral  . Tonsillectomy    . Hammertoe reconstruction with weil osteotomy Right 07/17/2012    Procedure: RIGHT SECOND AND THIRD MT WEIL OSTEOTOMIES AND SECOND AND THIRD HAMMERTOE CORRECTIONS;  Surgeon: Toni Arthurs, MD;  Location: [facility];  Service: Orthopedics;  Laterality: Right;  . Colonoscopy w/ polypectomy    .  Cardiac catheterization      stent  . Insert / replace / remove pacemaker    . Hammer toe surgery Left 01/15/2013    Procedure: LEFT SECOND THROUGH FOURTH HAMMERTOE CORRECTION ;  Surgeon: Toni Arthurs, MD;  Location: [facility];  Service: Orthopedics;  Laterality: Left;  . Weil osteotomy Left 01/15/2013    Procedure:  WEIL OSTEOTOMY AND DORSAL CAPSULOTOMY;  Surgeon: Toni Arthurs, MD;  Location: [facility];  Service: Orthopedics;  Laterality: Left;    Family History  Problem Relation Age of Onset  . Colon cancer Neg Hx   . Colon polyps Sister   . Heart disease Mother   . Heart disease Father   . Heart disease Brother     multiple brothers  . Heart disease Sister     multiple sisters  . Prostate cancer Brother   . Diabetes Daughter   . Diabetes      aunt  . Breast cancer Daughter   . Alcohol abuse Son   . Brain cancer Son   . Lung cancer Son     Allergies  Allergen Reactions  . Sulfonamide Derivatives     REACTION: rash and itching    Current Outpatient Prescriptions on File Prior to Visit  Medication Sig Dispense Refill  . acetaminophen-codeine (TYLENOL #3) 300-30 MG per tablet Take 1 tablet PRN pain (dental)  0  . albuterol (PROVENTIL HFA;VENTOLIN HFA) 108 (90 BASE) MCG/ACT inhaler Inhale 2 puffs into the lungs every 6 (six) hours as needed for wheezing or shortness of breath. 1 Inhaler 1  . aspirin (ASPIR-81) 81 MG EC tablet Take 81 mg by mouth daily.      . beclomethasone (QVAR) 40 MCG/ACT inhaler Inhale 2 puffs into the lungs 2 (two) times daily. 1 Inhaler 2  . doxycycline (VIBRA-TABS) 100 MG tablet Take 1 tablet (100 mg total) by mouth 2 (two) times daily. 20 tablet 0  . furosemide (LASIX) 20 MG tablet Take 1 tablet (20 mg total) by mouth daily as needed for fluid or edema. 90 tablet 1  . isosorbide mononitrate (IMDUR) 30 MG 24 hr tablet TAKE 1 AND 1/2 TABLETS EVERY MORNING 45 tablet 6  . levothyroxine (SYNTHROID, LEVOTHROID) 100 MCG tablet Take 1 tablet (100 mcg total) by mouth  daily. 90 tablet 1  . metoprolol succinate (TOPROL-XL) 50 MG 24 hr tablet TAKE 1 TABLET (50 MG TOTAL) BY MOUTH 2 (TWO) TIMES DAILY. TAKE WITH OR IMMEDIATELY FOLLOWING A MEAL. 60 tablet 3  . nitroGLYCERIN (NITROSTAT) 0.4 MG SL tablet Place 1 tablet (0.4 mg total) under the tongue every 5 (five) minutes as needed. At the onset of chest pain, Up to 3 doses 10  tablet 0  . pravastatin (PRAVACHOL) 20 MG tablet TAKE 1 TABLET (20 MG TOTAL) BY MOUTH AT BEDTIME. REPEAT LABS ARE DUE NOW 90 tablet 0  . Psyllium (METAFIBER) 48.57 % POWD Take 10 mLs by mouth every evening.     . ramipril (ALTACE) 5 MG capsule Take 1 capsule (5 mg total) by mouth daily. 30 capsule 6  . ranitidine (ZANTAC) 150 MG tablet Take 1 tablet (150 mg total) by mouth daily. 30 tablet 3   No current facility-administered medications on file prior to visit.    BP 149/95 mmHg  Pulse 83  Temp(Src) 97.7 F (36.5 C) (Oral)  Ht 5' (1.524 m)  Wt 129 lb (58.514 kg)  BMI 25.19 kg/m2  SpO2 97%       Objective:   Physical Exam  General- No acute distress. Pleasant patient. Neck- Full range of motion, no jvd Lungs- Clear, even and unlabored. Heart- regular rate and rhythm. Neurologic- CNII- XII grossly intact.  Rt elbow- nml resolved now. No swelling and no warmth.  Small abrasion to rt forearm. Healing. Triangle shaped abrasion.  Lt side of neck- faint pink  red area at base where tick was attached.Better appearance than on last visit.(almost resolved 100%)        Assessment & Plan:  Bursitis resolved now. If reoccurs let us know.  Tick bite resolved. Pt finished doxycycline.  Follow up in 3 months or as needed

## 2014-08-25 NOTE — Patient Instructions (Addendum)
Bursitis resolved now. If reoccurs let us know.  Tick bite resolved. Pt finished doxycycline.  Follow up in 3 months or as needed

## 2014-08-25 NOTE — Progress Notes (Signed)
Pre visit review using our clinic review tool, if applicable. No additional management support is needed unless otherwise documented below in the visit note. 

## 2014-08-26 ENCOUNTER — Encounter: Payer: Medicare Other | Admitting: *Deleted

## 2014-08-27 ENCOUNTER — Encounter: Payer: Self-pay | Admitting: Adult Health

## 2014-08-27 ENCOUNTER — Ambulatory Visit (INDEPENDENT_AMBULATORY_CARE_PROVIDER_SITE_OTHER): Payer: Medicare Other | Admitting: Adult Health

## 2014-08-27 VITALS — BP 132/78 | HR 88 | Temp 97.0°F | Ht 63.0 in | Wt 129.2 lb

## 2014-08-27 DIAGNOSIS — R059 Cough, unspecified: Secondary | ICD-10-CM

## 2014-08-27 DIAGNOSIS — G4734 Idiopathic sleep related nonobstructive alveolar hypoventilation: Secondary | ICD-10-CM | POA: Diagnosis not present

## 2014-08-27 DIAGNOSIS — R05 Cough: Secondary | ICD-10-CM

## 2014-08-27 NOTE — Assessment & Plan Note (Signed)
Nocturnal Hypoxia ? Etiology  sleep study is pending .

## 2014-08-27 NOTE — Assessment & Plan Note (Signed)
Cough ? RAD/Asthma +/- ACE cough  Would probably benefit from ACE discontinuation with ongoing cough  Appears to be doing okay on QVAR  Will defer PFT at this time .  If ongoing will need to do PFT to evaluate.  Control for triggers of GERD   Plan  Continue on Qvar two puffs twice per day >> rinse mouth after each use Continue on Zantac 150 mg pill nightly Follow up for sleep study as planned .  Discuss with your primary doctor that Altace (Ramipril ) could aggravate /cause your cough. Can discuss an alternative.  Follow up with Dr. Craige Cotta in  2 months  And As needed   Please contact office for sooner follow up if symptoms do not improve or worsen or seek emergency care

## 2014-08-27 NOTE — Patient Instructions (Signed)
Continue on Qvar two puffs twice per day >> rinse mouth after each use Continue on Zantac 150 mg pill nightly Follow up for sleep study as planned .  Discuss with your primary doctor that Altace (Ramipril ) could aggravate /cause your cough. Can discuss an alternative.  Follow up with Dr. Craige Cotta in  2 months  And As needed   Please contact office for sooner follow up if symptoms do not improve or worsen or seek emergency care

## 2014-08-27 NOTE — Progress Notes (Signed)
   Subjective:    Patient ID: [name], female    DOB: Feb 12, 1921, 81 y.o.   MRN: [id number]  HPI  81 year old female seen for pulmonary consult for cough, dyspnea, wheeze, and nocturnal hypoxemia. On 06/24/2014 with Dr. Craige Cotta.  Has CAD and CHF with EF at 45%. Has Hiatal Hernia and Chronic Right Hemidiaphragm.   08/27/2014 follow-up: Cough? ASTHMA /GERD /Nocturnal Hypoxemia  Patient returns for a six week follow-up visit. Patient was seen for a pulmonary consult for dyspnea, cough and nocturnal hypoxemia.in June  She was set up for a sleep study which is planned for 2 weeks for nocutrnal hypoxia  She was started on Qvar for possible asthma.   She does feel that this has helped with her breathing some . Still has some dry cough , wheezing and DOE.  She has started on  Zantac at bedtime for suspected GERD .  She has a history of hiatal hernia. She denies any chest pain, orthopnea, PND, or increased leg swelling. She is on an ACE , does have a daily cough . We discussed the possibility that this may make her cough worse. She denies chest pain, orthopnea , increased edema or fever.     Review of Systems  Constitutional:   No  weight loss, night sweats,  Fevers, chills, + fatigue,   HEENT:   No headaches,  Difficulty swallowing,  Tooth/dental problems, or  Sore throat,                No sneezing, itching, ear ache, nasal congestion, post nasal drip,   CV:  No chest pain,  Orthopnea, PND, swelling in lower extremities, anasarca, dizziness, palpitations, syncope.   GI  No heartburn, indigestion, abdominal pain, nausea, vomiting, diarrhea, change in bowel habits, loss of appetite, bloody stools.   Resp:   No excess mucus, no productive cough,     No coughing up of blood.  No change in color of mucus.  No wheezing.  No chest wall deformity.  Skin: no rash or lesions.  GU: no dysuria, change in color of urine, no urgency or frequency.  No flank pain, no hematuria   MS:  No joint  pain or swelling.  No decreased range of motion.  No back pain.  Psych:  No change in mood or affect. No depression or anxiety.  No memory loss.         Objective:   Physical Exam  GEN: A/Ox3; pleasant , NAD, appears younger than stated age, uses walker   HEENT:  So-Hi/AT,  EACs-clear, TMs-wnl, NOSE-clear, THROAT-clear, no lesions, no postnasal drip or exudate noted.   NECK:  Supple w/ fair ROM; no JVD; normal carotid impulses w/o bruits; no thyromegaly or nodules palpated; no lymphadenopathy.  RESP  Clear  P & A; w/o, wheezes/ rales/ or rhonchi.no accessory muscle use, no dullness to percussion, + upper airway psuedowheezing   CARD:  RRR, no m/r/g  , tr  peripheral edema, pulses intact, no cyanosis or clubbing.  GI:   Soft & nt; nml bowel sounds; no organomegaly or masses detected.  Musco: Warm bil, no deformities or joint swelling noted.  Arthritic changes of the hand   Neuro: alert, no focal deficits noted.    Skin: Warm, no lesions or rashes         Assessment & Plan:

## 2014-08-27 NOTE — Progress Notes (Signed)
Reviewed and agree with assessment/plan. 

## 2014-08-30 ENCOUNTER — Ambulatory Visit (INDEPENDENT_AMBULATORY_CARE_PROVIDER_SITE_OTHER): Payer: Medicare Other | Admitting: Family Medicine

## 2014-08-30 ENCOUNTER — Encounter: Payer: Self-pay | Admitting: Cardiology

## 2014-08-30 ENCOUNTER — Encounter: Payer: Self-pay | Admitting: Family Medicine

## 2014-08-30 VITALS — BP 140/60 | HR 80 | Temp 98.2°F | Ht 63.0 in | Wt 129.4 lb

## 2014-08-30 DIAGNOSIS — E038 Other specified hypothyroidism: Secondary | ICD-10-CM | POA: Diagnosis not present

## 2014-08-30 DIAGNOSIS — I10 Essential (primary) hypertension: Secondary | ICD-10-CM | POA: Diagnosis not present

## 2014-08-30 DIAGNOSIS — E785 Hyperlipidemia, unspecified: Secondary | ICD-10-CM | POA: Diagnosis not present

## 2014-08-30 DIAGNOSIS — N3020 Other chronic cystitis without hematuria: Secondary | ICD-10-CM | POA: Diagnosis not present

## 2014-08-30 LAB — POCT URINALYSIS DIPSTICK
Bilirubin, UA: NEGATIVE
Blood, UA: NEGATIVE
GLUCOSE UA: NEGATIVE
Ketones, UA: NEGATIVE
Leukocytes, UA: NEGATIVE
NITRITE UA: NEGATIVE
Protein, UA: NEGATIVE
Spec Grav, UA: <=1.005
UROBILINOGEN UA: 2
pH, UA: 6.5

## 2014-08-30 LAB — COMPREHENSIVE METABOLIC PANEL
ALK PHOS: 69 U/L (ref 39–117)
ALT: 14 U/L (ref 0–35)
AST: 22 U/L (ref 0–37)
Albumin: 4.2 g/dL (ref 3.5–5.2)
BILIRUBIN TOTAL: 0.6 mg/dL (ref 0.2–1.2)
BUN: 13 mg/dL (ref 6–23)
CALCIUM: 10.1 mg/dL (ref 8.4–10.5)
CO2: 31 mEq/L (ref 19–32)
Chloride: 102 mEq/L (ref 96–112)
Creatinine, Ser: 0.64 mg/dL (ref 0.40–1.20)
GFR: 92.38 mL/min (ref >60.00)
Glucose, Bld: 76 mg/dL (ref 70–99)
POTASSIUM: 4.4 meq/L (ref 3.5–5.1)
Sodium: 138 mEq/L (ref 135–145)
Total Protein: 6.9 g/dL (ref 6.0–8.3)

## 2014-08-30 LAB — LIPID PANEL
Cholesterol: 136 mg/dL (ref 0–200)
HDL: 43.7 mg/dL (ref >39.00)
LDL Cholesterol: 72 mg/dL (ref 0–99)
NonHDL: 92.72
Total CHOL/HDL Ratio: 3
Triglycerides: 103 mg/dL (ref 0.0–149.0)
VLDL: 20.6 mg/dL (ref 0.0–40.0)

## 2014-08-30 LAB — TSH: TSH: 1.9 u[IU]/mL (ref 0.35–4.50)

## 2014-08-30 MED ORDER — LOSARTAN POTASSIUM 50 MG PO TABS: 50.0000 mg | ORAL_TABLET | Freq: Every day | ORAL | Status: DC

## 2014-08-30 NOTE — Patient Instructions (Addendum)
Probiotic over the counter brand names Digestive Advantage, [employer] or Align.  Also, try Beano or Gas X. Start new Blood Pressure medicine Losartan     Gastroesophageal Reflux Disease, Adult Gastroesophageal reflux disease (GERD) happens when acid from your stomach flows up into the esophagus. When acid comes in contact with the esophagus, the acid causes soreness (inflammation) in the esophagus. Over time, GERD may create small holes (ulcers) in the lining of the esophagus. CAUSES   Increased body weight. This puts pressure on the stomach, making acid rise from the stomach into the esophagus.  Smoking. This increases acid production in the stomach.  Drinking alcohol. This causes decreased pressure in the lower esophageal sphincter (valve or ring of muscle between the esophagus and stomach), allowing acid from the stomach into the esophagus.  Late evening meals and a full stomach. This increases pressure and acid production in the stomach.  A malformed lower esophageal sphincter. Sometimes, no cause is found. SYMPTOMS   Burning pain in the lower part of the mid-chest behind the breastbone and in the mid-stomach area. This may occur twice a week or more often.  Trouble swallowing.  Sore throat.  Dry cough.  Asthma-like symptoms including chest tightness, shortness of breath, or wheezing. DIAGNOSIS  Your caregiver may be able to diagnose GERD based on your symptoms. In some cases, X-rays and other tests may be done to check for complications or to check the condition of your stomach and esophagus. TREATMENT  Your caregiver may recommend over-the-counter or prescription medicines to help decrease acid production. Ask your caregiver before starting or adding any new medicines.  HOME CARE INSTRUCTIONS   Change the factors that you can control. Ask your caregiver for guidance concerning weight loss, quitting smoking, and alcohol consumption.  Avoid foods and drinks that  make your symptoms worse, such as:  Caffeine or alcoholic drinks.  Chocolate.  Peppermint or mint flavorings.  Garlic and onions.  Spicy foods.  Citrus fruits, such as oranges, lemons, or limes.  Tomato-based foods such as sauce, chili, salsa, and pizza.  Fried and fatty foods.  Avoid lying down for the 3 hours prior to your bedtime or prior to taking a nap.  Eat small, frequent meals instead of large meals.  Wear loose-fitting clothing. Do not wear anything tight around your waist that causes pressure on your stomach.  Raise the head of your bed 6 to 8 inches with wood blocks to help you sleep. Extra pillows will not help.  Only take over-the-counter or prescription medicines for pain, discomfort, or fever as directed by your caregiver.  Do not take aspirin, ibuprofen, or other nonsteroidal anti-inflammatory drugs (NSAIDs). SEEK IMMEDIATE MEDICAL CARE IF:   You have pain in your arms, neck, jaw, teeth, or back.  Your pain increases or changes in intensity or duration.  You develop nausea, vomiting, or sweating (diaphoresis).  You develop shortness of breath, or you faint.  Your vomit is green, yellow, black, or looks like coffee grounds or blood.  Your stool is red, bloody, or black. These symptoms could be signs of other problems, such as heart disease, gastric bleeding, or esophageal bleeding. MAKE SURE YOU:   Understand these instructions.  Will watch your condition.  Will get help right away if you are not doing well or get worse. Document Released: 10/04/2004 Document Revised: 03/19/2011 Document Reviewed: 07/14/2010 [facility] Patient Information 2015 [place], [place]. This information is not intended to replace advice given to you by your health care  provider. Make sure you discuss any questions you have with your health care provider.

## 2014-08-30 NOTE — Progress Notes (Signed)
Patient ID: [name], female   DOB: Apr 11, 1921, 81 y.o.   MRN: [id number]   Subjective:    Patient ID: [name], female    DOB: Sep 16, 1921, 81 y.o.   MRN: [id number]  Chief Complaint  Patient presents with  . Follow-up    on medications    HPI Patient is in today for f/u on meds, labs and refills.  No complaints.   Past Medical History  Diagnosis Date  . Chest pain 12/09    recent hospitalization for it. felt to be noncardiac.   [name] CAD (coronary artery disease)     a. s/p prior anterior wall myocardial infarction and multiple percutaneous coronary intervention.;  b. [place] MV 1/14:  low risk, inf and apical defect likely due to atten artifact, no ischemia, EF 35% (visually normal and est 55-60%)  . Ischemic cardiomyopathy     EF 25-30% improved to 45% with biventricular pacing.   . Systolic heart failure     improved to class I to II   . Automatic implantable cardioverter/defibrillator (AICD) activation     s/p implantable cardioverter-defibrillator bivenrticular pacer. 12/05. St, Jude [employer]. remote-no.  [name] HTN (hypertension)   . Hyperlipidemia   . GERD (gastroesophageal reflux disease)   . Esophageal stricture     last dialated 2007  . Hiatal hernia   . DD (diverticular disease)     Hx of it - severe. Left colon 2004  . Other urinary problems     urinary incontenance  . UTI (lower urinary tract infection)     recurrent. (Klebsiella pneumoniae). Last Cx 02/03/09. (R-ancef, nitrofurantoin, Augmentin, Zyosyn)  . Arthritis   . PVT (paroxysmal ventricular tachycardia)   . CHF (congestive heart failure)   . Hypothyroidism   . Anxiety     sleep  . Pacemaker   . Automatic implantable cardioverter-defibrillator in situ   . Heart murmur   . History of recurrent UTIs   . Shortness of breath     with exertion  . Urgency of urination   . Frequent urination at night     Past Surgical History  Procedure Laterality Date  . Angioplasty      stent  . Unspecified  area hysterectomy    . Tubal ligation    . Appendectomy    . Cardiac defibrillator placement      [facility]  . Cataract extraction, bilateral    . Total knee arthroplasty      bilateral  . Tonsillectomy    . Hammertoe reconstruction with weil osteotomy Right 07/17/2012    Procedure: RIGHT SECOND AND THIRD MT WEIL OSTEOTOMIES AND SECOND AND THIRD HAMMERTOE CORRECTIONS;  Surgeon: Toni Arthurs, MD;  Location: [facility] OR;  Service: Orthopedics;  Laterality: Right;  . Colonoscopy w/ polypectomy    . Cardiac catheterization      stent  . Insert / replace / remove pacemaker    . Hammer toe surgery Left 01/15/2013    Procedure: LEFT SECOND THROUGH FOURTH HAMMERTOE CORRECTION ;  Surgeon: Toni Arthurs, MD;  Location: [facility] OR;  Service: Orthopedics;  Laterality: Left;  . Weil osteotomy Left 01/15/2013    Procedure:  WEIL OSTEOTOMY AND DORSAL CAPSULOTOMY;  Surgeon: Toni Arthurs, MD;  Location: [facility] OR;  Service: Orthopedics;  Laterality: Left;    Family History  Problem Relation Age of Onset  . Colon cancer Neg Hx   . Colon polyps Sister   . Heart disease Mother   . Heart disease Father   .  Heart disease Brother     multiple brothers  . Heart disease Sister     multiple sisters  . Prostate cancer Brother   . Diabetes Daughter   . Diabetes      aunt  . Breast cancer Daughter   . Alcohol abuse Son   . Brain cancer Son   . Lung cancer Son     Social History   Social History  . Marital Status: Widowed    Spouse Name: N/A  . Number of Children: N/A  . Years of Education: N/A   Occupational History  . Retired    Social History Main Topics  . Smoking status: Never Smoker   . Smokeless tobacco: Never Used  . Alcohol Use: No  . Drug Use: No  . Sexual Activity: Not on file   Other Topics Concern  . Not on file   Social History Narrative   Widowed, husband died of brain tumor. Son lives in [place], [place]- was in a MVA for DWI. He has had a long hx of alcohol problems, she has helped him out  financially in the past but is ready to be much stricter. Pt. Has a daughter who live in [place] with Breast Ca. She also lost a son to a brain tumor.     Outpatient Prescriptions Prior to Visit  Medication Sig Dispense Refill  . aspirin (ASPIR-81) 81 MG EC tablet Take 81 mg by mouth daily.      . furosemide (LASIX) 20 MG tablet Take 1 tablet (20 mg total) by mouth daily as needed for fluid or edema. 90 tablet 1  . isosorbide mononitrate (IMDUR) 30 MG 24 hr tablet TAKE 1 AND 1/2 TABLETS EVERY MORNING 45 tablet 6  . levothyroxine (SYNTHROID, LEVOTHROID) 100 MCG tablet Take 1 tablet (100 mcg total) by mouth daily. 90 tablet 1  . metoprolol succinate (TOPROL-XL) 50 MG 24 hr tablet TAKE 1 TABLET (50 MG TOTAL) BY MOUTH 2 (TWO) TIMES DAILY. TAKE WITH OR IMMEDIATELY FOLLOWING A MEAL. 60 tablet 3  . nitroGLYCERIN (NITROSTAT) 0.4 MG SL tablet Place 1 tablet (0.4 mg total) under the tongue every 5 (five) minutes as needed. At the onset of chest pain, Up to 3 doses 10 tablet 0  . pravastatin (PRAVACHOL) 20 MG tablet TAKE 1 TABLET (20 MG TOTAL) BY MOUTH AT BEDTIME. REPEAT LABS ARE DUE NOW 90 tablet 0  . Psyllium (METAFIBER) 48.57 % POWD Take 10 mLs by mouth every evening.     . ramipril (ALTACE) 5 MG capsule Take 1 capsule (5 mg total) by mouth daily. 30 capsule 6  . ranitidine (ZANTAC) 150 MG tablet Take 1 tablet (150 mg total) by mouth daily. 30 tablet 3  . acetaminophen-codeine (TYLENOL #3) 300-30 MG per tablet Take 1 tablet PRN pain (dental)  0  . albuterol (PROVENTIL HFA;VENTOLIN HFA) 108 (90 BASE) MCG/ACT inhaler Inhale 2 puffs into the lungs every 6 (six) hours as needed for wheezing or shortness of breath. (Patient not taking: Reported on 08/30/2014) 1 Inhaler 1  . beclomethasone (QVAR) 40 MCG/ACT inhaler Inhale 2 puffs into the lungs 2 (two) times daily. (Patient not taking: Reported on 08/30/2014) 1 Inhaler 2  . doxycycline (VIBRA-TABS) 100 MG tablet Take 1 tablet (100 mg total) by mouth 2 (two) times  daily. (Patient not taking: Reported on 08/27/2014) 20 tablet 0   No facility-administered medications prior to visit.    Allergies  Allergen Reactions  . Sulfonamide Derivatives     REACTION: rash  and itching    Review of Systems  Constitutional: Negative for fever and malaise/fatigue.  HENT: Negative for congestion.   Eyes: Negative for discharge.  Respiratory: Positive for cough. Negative for shortness of breath.   Cardiovascular: Negative for chest pain, palpitations and leg swelling.  Gastrointestinal: Positive for abdominal pain. Negative for nausea.  Genitourinary: Negative for dysuria.  Musculoskeletal: Negative for falls.  Skin: Negative for rash.  Neurological: Negative for loss of consciousness and headaches.  Endo/Heme/Allergies: Negative for environmental allergies.  Psychiatric/Behavioral: Negative for depression. The patient is not nervous/anxious.        Objective:    Physical Exam  Constitutional: She is oriented to person, place, and time. She appears well-developed and well-nourished.  HENT:  Head: Normocephalic and atraumatic.  Eyes: Conjunctivae and EOM are normal.  Neck: Normal range of motion. Neck supple. No JVD present. Carotid bruit is not present. No thyromegaly present.  Cardiovascular: Normal rate, regular rhythm and normal heart sounds.   No murmur heard. Pulmonary/Chest: Effort normal and breath sounds normal. No respiratory distress. She has no wheezes. She has no rales. She exhibits no tenderness.  Musculoskeletal: She exhibits no edema.  Neurological: She is alert and oriented to person, place, and time.  Psychiatric: She has a normal mood and affect. Her behavior is normal.    BP 140/60 mmHg  Pulse 80  Temp(Src) 98.2 F (36.8 C) (Oral)  Ht  (1.6 m)  Wt 129 lb 6.4 oz (58.695 kg)  BMI 22.93 kg/m2  SpO2 95% Wt Readings from Last 3 Encounters:  08/30/14 129 lb 6.4 oz (58.695 kg)  08/27/14 129 lb 3.2 oz (58.605 kg)  08/25/14 129  lb (58.514 kg)     Lab Results  Component Value Date   WBC 7.0 08/11/2014   HGB 13.4 08/11/2014   HCT 39.9 08/11/2014   PLT 207.0 08/11/2014   GLUCOSE 104* 07/14/2014   CHOL 120 02/18/2014   TRIG 75.0 02/18/2014   HDL 43.00 02/18/2014   LDLCALC 62 02/18/2014   ALT 16 02/18/2014   AST 24 02/18/2014   NA 140 07/14/2014   K 4.9 07/14/2014   CL 103 07/14/2014   CREATININE 0.65 07/14/2014   BUN 15 07/14/2014   CO2 30 07/14/2014   TSH 1.07 02/18/2014   INR 0.9 ratio 11/14/2009   HGBA1C  01/02/2008    6.1 (NOTE)   The ADA recommends the following therapeutic goal for glycemic   control related to Hgb A1C measurement:   Goal of Therapy:   < 7.0% Hgb A1C   Reference: American Diabetes Association: Clinical Practice   Recommendations 2008, Diabetes Care,  2008, 31:(Suppl 1).    Lab Results  Component Value Date   TSH 1.07 02/18/2014   Lab Results  Component Value Date   WBC 7.0 08/11/2014   HGB 13.4 08/11/2014   HCT 39.9 08/11/2014   MCV 91.6 08/11/2014   PLT 207.0 08/11/2014   Lab Results  Component Value Date   NA 140 07/14/2014   K 4.9 07/14/2014   CO2 30 07/14/2014   GLUCOSE 104* 07/14/2014   BUN 15 07/14/2014   CREATININE 0.65 07/14/2014   BILITOT 0.8 02/18/2014   ALKPHOS 85 02/18/2014   AST 24 02/18/2014   ALT 16 02/18/2014   PROT 7.0 02/18/2014   ALBUMIN 4.3 02/18/2014   CALCIUM 10.4 07/14/2014   GFR 90.77 07/14/2014   Lab Results  Component Value Date   CHOL 120 02/18/2014   Lab Results  Component Value  Date   HDL 43.00 02/18/2014   Lab Results  Component Value Date   LDLCALC 62 02/18/2014   Lab Results  Component Value Date   TRIG 75.0 02/18/2014   Lab Results  Component Value Date   CHOLHDL 3 02/18/2014   Lab Results  Component Value Date   HGBA1C  01/02/2008    6.1 (NOTE)   The ADA recommends the following therapeutic goal for glycemic   control related to Hgb A1C measurement:   Goal of Therapy:   < 7.0% Hgb A1C   Reference:  American Diabetes Association: Clinical Practice   Recommendations 2008, Diabetes Care,  2008, 31:(Suppl 1).       Assessment & Plan:   Problem List Items Addressed This Visit    None    1. HYPERTENSION, BENIGN Con't metoprolol and losartan - Comprehensive metabolic panel - Lipid panel - TSH - POCT urinalysis dipstick  2. Other specified hypothyroidism Cont synthroid - Comprehensive metabolic panel - Lipid panel - TSH - POCT urinalysis dipstick  3. Chronic cystitis   - Comprehensive metabolic panel - Lipid panel - TSH - POCT urinalysis dipstick  4. Hyperlipidemia con't pravastatin Check labs   I have discontinued Ms. Lozito's doxycycline. I am also having her maintain her Psyllium, aspirin, furosemide, nitroGLYCERIN, ranitidine, levothyroxine, beclomethasone, albuterol, ramipril, acetaminophen-codeine, metoprolol succinate, pravastatin, and isosorbide mononitrate.  No orders of the defined types were placed in this encounter.     Baird Kay, LPN

## 2014-08-30 NOTE — Progress Notes (Signed)
Pre visit review using our clinic review tool, if applicable. No additional management support is needed unless otherwise documented below in the visit note. 

## 2014-08-31 NOTE — Assessment & Plan Note (Signed)
con't metoprolol con't losartan stable

## 2014-08-31 NOTE — Assessment & Plan Note (Signed)
con't pravachol Check labs 

## 2014-09-02 ENCOUNTER — Encounter: Payer: Self-pay | Admitting: Internal Medicine

## 2014-09-02 ENCOUNTER — Ambulatory Visit (INDEPENDENT_AMBULATORY_CARE_PROVIDER_SITE_OTHER): Payer: Medicare Other | Admitting: Internal Medicine

## 2014-09-02 VITALS — BP 118/52 | HR 76 | Ht 59.0 in | Wt 128.5 lb

## 2014-09-02 DIAGNOSIS — R1310 Dysphagia, unspecified: Secondary | ICD-10-CM | POA: Diagnosis not present

## 2014-09-02 DIAGNOSIS — K222 Esophageal obstruction: Secondary | ICD-10-CM | POA: Diagnosis not present

## 2014-09-02 NOTE — Patient Instructions (Addendum)
You have been scheduled for a Barium Esophogram at [facility] (1st floor of the hospital) on 09/09/2014 at 10:30am. Please arrive 15 minutes prior to your appointment for registration. Make certain not to have anything to eat or drink 3 hours prior to your test. If you need to reschedule for any reason, please contact radiology at [phone] to do so. __________________________________________________________________ A barium swallow is an examination that concentrates on views of the esophagus. This tends to be a double contrast exam (barium and two liquids which, when combined, create a gas to distend the wall of the oesophagus) or single contrast (non-ionic iodine based). The study is usually tailored to your symptoms so a good history is essential. Attention is paid during the study to the form, structure and configuration of the esophagus, looking for functional disorders (such as aspiration, dysphagia, achalasia, motility and reflux) EXAMINATION You may be asked to change into a gown, depending on the type of swallow being performed. A radiologist and radiographer will perform the procedure. The radiologist will advise you of the type of contrast selected for your procedure and direct you during the exam. You will be asked to stand, sit or lie in several different positions and to hold a small amount of fluid in your mouth before being asked to swallow while the imaging is performed .In some instances you may be asked to swallow barium coated marshmallows to assess the motility of a solid food bolus. The exam can be recorded as a digital or video fluoroscopy procedure. POST PROCEDURE It will take 1-2 days for the barium to pass through your system. To facilitate this, it is important, unless otherwise directed, to increase your fluids for the next 24-48hrs and to resume your normal diet.  This test typically takes about 30 minutes to  perform. __________________________________________________________________________________ Dr Laury Axon

## 2014-09-02 NOTE — Progress Notes (Signed)
[name] 1921/11/03 [id number]  Note: This dictation was prepared with Dragon digital system. Any transcriptional errors that result from this procedure are unintentional.   History of Present Illness: This is a 81 year old white female with dysphagia to pills, solids and some liquids . She has a history of  benign distal esophageal stricture which was dilated on multiple occasions following a food impaction in 1994, 1996, 2004, 2006 and 2007. She has noticed  occasional cough but denies  waking up at night. Most of her cough and acid reflux occurs at the end of the day or at night. She sleeps with the head of the bed elevation. She has severe ischemic cardiomyopathy with ejection fraction of 25-30% which has increased to 45%.. Because of the her cardiovascular  risk factors we have been hesitant to repeat upper endoscopies and dilations since her symptoms are more suggestive of presbyesophagus. She has been on  ranitidine 150 mg at bedtime    Past Medical History  Diagnosis Date  . Chest pain 12/09    recent hospitalization for it. felt to be noncardiac.   [name] CAD (coronary artery disease)     a. s/p prior anterior wall myocardial infarction and multiple percutaneous coronary intervention.;  b. Aden MV 1/14:  low risk, inf and apical defect likely due to atten artifact, no ischemia, EF 35% (visually normal and est 55-60%)  . Ischemic cardiomyopathy     EF 25-30% improved to 45% with biventricular pacing.   . Systolic heart failure     improved to class I to II   . Automatic implantable cardioverter/defibrillator (AICD) activation     s/p implantable cardioverter-defibrillator bivenrticular pacer. 12/05. St, Jude Medtronic. remote-no.  [name] HTN (hypertension)   . Hyperlipidemia   . GERD (gastroesophageal reflux disease)   . Esophageal stricture     last dialated 2007  . Hiatal hernia   . DD (diverticular disease)     Hx of it - severe. Left colon 2004  . Other urinary problems    urinary incontenance  . UTI (lower urinary tract infection)     recurrent. (Klebsiella pneumoniae). Last Cx 02/03/09. (R-ancef, nitrofurantoin, Augmentin, Zyosyn)  . Arthritis   . PVT (paroxysmal ventricular tachycardia)   . CHF (congestive heart failure)   . Hypothyroidism   . Anxiety     sleep  . Pacemaker   . Automatic implantable cardioverter-defibrillator in situ   . Heart murmur   . History of recurrent UTIs   . Shortness of breath     with exertion  . Urgency of urination   . Frequent urination at night     Past Surgical History  Procedure Laterality Date  . Angioplasty      stent  . Unspecified area hysterectomy    . Tubal ligation    . Appendectomy    . Cardiac defibrillator placement      [facility]  . Cataract extraction, bilateral    . Total knee arthroplasty      bilateral  . Tonsillectomy    . Hammertoe reconstruction with weil osteotomy Right 07/17/2012    Procedure: RIGHT SECOND AND THIRD MT WEIL OSTEOTOMIES AND SECOND AND THIRD HAMMERTOE CORRECTIONS;  Surgeon: Toni Arthurs, MD;  Location: [facility] OR;  Service: Orthopedics;  Laterality: Right;  . Colonoscopy w/ polypectomy    . Cardiac catheterization      stent  . Insert / replace / remove pacemaker    . Hammer toe surgery Left 01/15/2013  Procedure: LEFT SECOND THROUGH FOURTH HAMMERTOE CORRECTION ;  Surgeon: Toni Arthurs, MD;  Location: [facility] OR;  Service: Orthopedics;  Laterality: Left;  . Weil osteotomy Left 01/15/2013    Procedure:  WEIL OSTEOTOMY AND DORSAL CAPSULOTOMY;  Surgeon: Toni Arthurs, MD;  Location: [facility] OR;  Service: Orthopedics;  Laterality: Left;    Allergies  Allergen Reactions  . Sulfonamide Derivatives     REACTION: rash and itching    Family history and social history have been reviewed.  Review of Systems: Dysphagia to pills, liquids and solids. Denies odynophagia. Occasional cough denies hoarseness  The remainder of the 10 point ROS is negative except as outlined in the H&P  Physical  Exam: General Appearance thin flows is. Ambulates with a walker, in no distress Eyes  Non icteric  HEENT  Non traumatic, normocephalic  Mouth No lesion, tongue papillated, no cheilosis, voice is slightly hoarse Neck Supple without adenopathy, thyroid not enlarged, no carotid bruits, no JVD Lungs Clear to auscultation bilaterally COR Normal S1, normal S2, regular rhythm, no murmur, quiet precordium Abdomen soft nontender Rectal not done Extremities  No pedal edema Skin No lesions Neurological Alert and oriented x 3 Psychological Normal mood and affect  Assessment and Plan:   81 year old white female with the history of esophageal stricture with  recent  recurrence of dysphagia to liquids as well as solids and pills suggestive of presbyesophagus. There is a suggestion of possible LPR. Cause of her age and severe cardiovascular problems we will obtain barium esophagram with the 39s and with the tablet. I have discussedl with the patientl possibility of her esophageal muscles not propelling the food properly and the possibility of having to see as speech pathologist to learn strategies to prevent aspiration  . She will continue ranitidine 150 mg at bedtime, if the barium esophagram shows severe reflux , she may need to increase in her acid suppressing regimen.  F/up Dr Jessie Foot 09/02/2014

## 2014-09-06 ENCOUNTER — Ambulatory Visit (HOSPITAL_BASED_OUTPATIENT_CLINIC_OR_DEPARTMENT_OTHER): Payer: Medicare Other

## 2014-09-09 ENCOUNTER — Other Ambulatory Visit: Payer: Self-pay | Admitting: Family Medicine

## 2014-09-09 ENCOUNTER — Ambulatory Visit (HOSPITAL_COMMUNITY)
Admission: RE | Admit: 2014-09-09 | Discharge: 2014-09-09 | Disposition: A | Discharge status: Home / Self Care | Payer: Medicare Other | Source: Ambulatory Visit | Attending: Internal Medicine | Admitting: Internal Medicine

## 2014-09-09 ENCOUNTER — Ambulatory Visit (HOSPITAL_BASED_OUTPATIENT_CLINIC_OR_DEPARTMENT_OTHER): Payer: Medicare Other | Admitting: Radiology

## 2014-09-09 DIAGNOSIS — R0683 Snoring: Secondary | ICD-10-CM | POA: Diagnosis not present

## 2014-09-09 DIAGNOSIS — K219 Gastro-esophageal reflux disease without esophagitis: Secondary | ICD-10-CM | POA: Insufficient documentation

## 2014-09-09 DIAGNOSIS — I493 Ventricular premature depolarization: Secondary | ICD-10-CM | POA: Insufficient documentation

## 2014-09-09 DIAGNOSIS — K224 Dyskinesia of esophagus: Secondary | ICD-10-CM | POA: Diagnosis not present

## 2014-09-09 DIAGNOSIS — G4734 Idiopathic sleep related nonobstructive alveolar hypoventilation: Secondary | ICD-10-CM

## 2014-09-09 DIAGNOSIS — G4733 Obstructive sleep apnea (adult) (pediatric): Secondary | ICD-10-CM | POA: Insufficient documentation

## 2014-09-09 DIAGNOSIS — K449 Diaphragmatic hernia without obstruction or gangrene: Secondary | ICD-10-CM | POA: Diagnosis not present

## 2014-09-09 DIAGNOSIS — K222 Esophageal obstruction: Secondary | ICD-10-CM

## 2014-09-09 DIAGNOSIS — I5022 Chronic systolic (congestive) heart failure: Secondary | ICD-10-CM

## 2014-09-09 DIAGNOSIS — R1310 Dysphagia, unspecified: Secondary | ICD-10-CM | POA: Insufficient documentation

## 2014-09-14 ENCOUNTER — Telehealth: Payer: Self-pay | Admitting: Pulmonary Disease

## 2014-09-14 ENCOUNTER — Encounter (HOSPITAL_BASED_OUTPATIENT_CLINIC_OR_DEPARTMENT_OTHER): Payer: Medicare Other | Admitting: Pulmonary Disease

## 2014-09-14 DIAGNOSIS — G4733 Obstructive sleep apnea (adult) (pediatric): Secondary | ICD-10-CM

## 2014-09-14 NOTE — Telephone Encounter (Signed)
PSG 09/09/14 >> RDI 16.8, SaO2 low 84%, REM AHI 26.7.  Will have my nurse inform pt that sleep study showed moderate sleep apnea.  She will need ROV to discuss tx options in more detail.

## 2014-09-14 NOTE — Progress Notes (Signed)
Patient Name: [name], [name] Date: 09/09/2014 Gender: Female D.O.B: 06-14-1921 Age (years): 54 Referring Provider: Coralyn Helling MD, ABSM Height (inches): 58 Interpreting Physician: Coralyn Helling MD, ABSM Weight (lbs): 128 RPSGT: Shelah Lewandowsky BMI: 27 MRN: [id number] Neck Size: 13.50  CLINICAL INFORMATION Sleep Study Type: NPSG  Indication for sleep study: Congestive Heart Failure, Daytime Fatigue, Fatigue, Morning Headaches, Snoring  Epworth Sleepiness Score: 8  SLEEP STUDY TECHNIQUE As per the AASM Manual for the Scoring of Sleep and Associated Events v2.3 (April 2016) with a hypopnea requiring 4% desaturations.  The channels recorded and monitored were frontal, central and occipital EEG, electrooculogram (EOG), submentalis EMG (chin), nasal and oral airflow, thoracic and abdominal wall motion, anterior tibialis EMG, snore microphone, electrocardiogram, and pulse oximetry.  MEDICATIONS Patient's medications include: reviewed in electronic medical record. Medications self-administered by patient during sleep study : No sleep medicine administered.  SLEEP ARCHITECTURE The study was initiated at 10:23:03 PM and ended at 5:02:16 AM.  Sleep onset time was 12.0 minutes and the sleep efficiency was 42.2%. The total sleep time was 168.5 minutes.  Stage REM latency was 104.5 minutes.  The patient spent 21.07% of the night in stage N1 sleep, 65.58% in stage N2 sleep, 0.00% in stage N3 and 13.35% in REM.  Alpha intrusion was absent.  Supine sleep was 0.00%.  RESPIRATORY PARAMETERS The overall respiratory disturbance index was 16.4 per hour.  There were 0 total apneas, including 0 obstructive, 0 central and 0 mixed apneas. There were 10 hypopneas and 36 RERAs.  The AHI during Stage REM sleep was 26.7 per hour.  AHI while supine was N/A per hour.  The mean oxygen saturation was 93.37%. The minimum SpO2 during sleep was 84.00%.  Soft snoring was noted during this  study.  CARDIAC DATA The 2 lead EKG demonstrated pacemaker generated. The mean heart rate was 63.69 beats per minute. Other EKG findings include: PVCs.  LEG MOVEMENT DATA The total PLMS were 56 with a resulting PLMS index of 19.94. Associated arousal with leg movement index was 4.6 .  IMPRESSIONS Moderate obstructive sleep apnea occurred during this study (RDI = 16.4/h).  The majority of her respiratory events occurred during REM sleep. No significant central sleep apnea occurred during this study (CAI = 0.0/h). Mild oxygen desaturation was noted during this study (Min O2 = 84.00%). The patient snored with Soft snoring volume. EKG findings include PVCs. Mild periodic limb movements of sleep occurred during the study. No significant associated arousals.  DIAGNOSIS Obstructive sleep apnea (ICD 10 G47.33).  RECOMMENDATIONS Additional therapies include CPAP, oral appliance, and surgical intervention.  She should also have her overnight oximetry repeated once on therapy.   Coralyn Helling, MD, ABSM Diplomate, American Board of Sleep Medicine 09/14/2014, 8:37 AM  NPI: [id number]

## 2014-09-16 NOTE — Telephone Encounter (Signed)
Called and spoke with pt Reviewed results and recs Scheduled appt with TP on 10/01/14 Pt voiced understanding and had no further questions. Nothing further needed

## 2014-10-01 ENCOUNTER — Encounter: Payer: Self-pay | Admitting: Adult Health

## 2014-10-01 ENCOUNTER — Ambulatory Visit (INDEPENDENT_AMBULATORY_CARE_PROVIDER_SITE_OTHER): Payer: Medicare Other | Admitting: Adult Health

## 2014-10-01 VITALS — BP 138/70 | HR 77 | Temp 97.6°F | Ht 59.0 in | Wt 129.0 lb

## 2014-10-01 DIAGNOSIS — G4730 Sleep apnea, unspecified: Secondary | ICD-10-CM | POA: Diagnosis not present

## 2014-10-01 DIAGNOSIS — G4734 Idiopathic sleep related nonobstructive alveolar hypoventilation: Secondary | ICD-10-CM

## 2014-10-01 DIAGNOSIS — R05 Cough: Secondary | ICD-10-CM | POA: Diagnosis not present

## 2014-10-01 DIAGNOSIS — R059 Cough, unspecified: Secondary | ICD-10-CM

## 2014-10-01 DIAGNOSIS — G4733 Obstructive sleep apnea (adult) (pediatric): Secondary | ICD-10-CM | POA: Diagnosis not present

## 2014-10-01 DIAGNOSIS — Z23 Encounter for immunization: Secondary | ICD-10-CM | POA: Diagnosis not present

## 2014-10-01 NOTE — Progress Notes (Signed)
Reviewed and agree.

## 2014-10-01 NOTE — Patient Instructions (Addendum)
We are setting you up for a CPAP titration study .  Do not drive if sleepy.  Goal for CPAP is to wear for at least 6 hrs each night.  Download in 1 month after starting CPAP At bedtime   Follow up Dr. Craige Cotta  In 2 months after CPAP started .  Flu shot today .

## 2014-10-01 NOTE — Progress Notes (Signed)
   Subjective:    Patient ID: [name], female    DOB: 08/17/1921, 81 y.o.   MRN: [id number]  HPI 81 year old female seen for pulmonary consult for cough, dyspnea, wheeze, and nocturnal hypoxemia. On 06/24/2014 with Dr. Craige Cotta.  Has CAD and CHF with EF at 45%. Has Hiatal Hernia and Chronic Right Hemidiaphragm.   10/01/2014 follow-up: OSA  Patient returns for follow up for sleep apnea  Pt was noted to have nocturnal hypoxemia.in June .  She was set up for a sleep study that was done 09/09/2014. PSG 09/09/14 >> RDI 16.8, SaO2 low 84%, REM AHI 26.7 We discussed that she has moderate sleep apnea.  She does have  HTN, CAD and CHF .  We discussed OSA and tx options with CPAP .  She is okay with starting would like to go back to sleep lab to have set up.  She was seen for cough Sima Matas ,? Asthma. Feels QVAR is helping .  ACE was stopped , cough only comes and goes now.  Denies chest pain, orthopnea, edema or fever       Review of Systems Constitutional:   No  weight loss, night sweats,  Fevers, chills, + fatigue,   HEENT:   No headaches,  Difficulty swallowing,  Tooth/dental problems, or  Sore throat,                No sneezing, itching, ear ache, nasal congestion, post nasal drip,   CV:  No chest pain,  Orthopnea, PND, swelling in lower extremities, anasarca, dizziness, palpitations, syncope.   GI  No heartburn, indigestion, abdominal pain, nausea, vomiting, diarrhea, change in bowel habits, loss of appetite, bloody stools.   Resp:   No excess mucus, no productive cough,     No coughing up of blood.  No change in color of mucus.  No wheezing.  No chest wall deformity.  Skin: no rash or lesions.  GU: no dysuria, change in color of urine, no urgency or frequency.  No flank pain, no hematuria   MS:  No joint pain or swelling.  No decreased range of motion.  No back pain.  Psych:  No change in mood or affect. No depression or anxiety.  No memory loss.         Objective:     Physical Exam GEN: A/Ox3; pleasant , NAD, appears younger than stated age, uses walker   HEENT:  [place]/AT,  EACs-clear, TMs-wnl, NOSE-clear, THROAT-clear, no lesions, no postnasal drip or exudate noted. Class 1 airway   NECK:  Supple w/ fair ROM; no JVD; normal carotid impulses w/o bruits; no thyromegaly or nodules palpated; no lymphadenopathy.  RESP  Clear  P & A; w/o, wheezes/ rales/ or rhonchi.no accessory muscle use, no dullness to percussion,   CARD:  RRR, no m/r/g  , tr  peripheral edema, pulses intact, no cyanosis or clubbing.  GI:   Soft & nt; nml bowel sounds; no organomegaly or masses detected.  Musco: Warm bil, no deformities or joint swelling noted.  Arthritic changes of the hand   Neuro: alert, no focal deficits noted.    Skin: Warm, no lesions or rashes         Assessment & Plan:

## 2014-10-01 NOTE — Assessment & Plan Note (Signed)
Cough ? Asthma  Improved symptoms on QVAR .

## 2014-10-01 NOTE — Assessment & Plan Note (Signed)
Moderate OSA  Pt education given  Will send for CPAP titration study with CPAP set up at home afterward with download in  1 month and ov in 2 month    Plan  We are setting you up for a CPAP titration study .  Do not drive if sleepy.  Goal for CPAP is to wear for at least 6 hrs each night.  Download in 1 month after starting CPAP At bedtime   Follow up Dr. Craige Cotta  In 2 months after CPAP started .  Flu shot today .

## 2014-10-05 ENCOUNTER — Ambulatory Visit: Payer: Medicare Other

## 2014-10-05 DIAGNOSIS — R0902 Hypoxemia: Secondary | ICD-10-CM | POA: Diagnosis not present

## 2014-10-06 ENCOUNTER — Encounter: Payer: Self-pay | Admitting: *Deleted

## 2014-10-28 ENCOUNTER — Ambulatory Visit (INDEPENDENT_AMBULATORY_CARE_PROVIDER_SITE_OTHER): Payer: Medicare Other | Admitting: Pulmonary Disease

## 2014-10-28 ENCOUNTER — Encounter: Payer: Self-pay | Admitting: Pulmonary Disease

## 2014-10-28 VITALS — BP 138/82 | HR 80 | Ht <= 58 in | Wt 130.0 lb

## 2014-10-28 DIAGNOSIS — Z9989 Dependence on other enabling machines and devices: Principal | ICD-10-CM

## 2014-10-28 DIAGNOSIS — J4530 Mild persistent asthma, uncomplicated: Secondary | ICD-10-CM | POA: Diagnosis not present

## 2014-10-28 DIAGNOSIS — G4733 Obstructive sleep apnea (adult) (pediatric): Secondary | ICD-10-CM | POA: Diagnosis not present

## 2014-10-28 MED ORDER — ALBUTEROL SULFATE HFA 108 (90 BASE) MCG/ACT IN AERS: 2.0000 | INHALATION_SPRAY | Freq: Four times a day (QID) | RESPIRATORY_TRACT | Status: DC | PRN

## 2014-10-28 MED ORDER — QVAR 40 MCG/ACT IN AERS: 2.0000 | INHALATION_SPRAY | Freq: Every day | RESPIRATORY_TRACT | Status: DC

## 2014-10-28 NOTE — Patient Instructions (Signed)
Will arrange for CPAP mask refitting Albuterol two puffs every four to six hours as needed for cough, wheeze, or chest congestion Restart Qvar if you need to use albuterol more than 2 to 3 times in a week  Follow up in 3 months

## 2014-10-28 NOTE — Progress Notes (Signed)
Chief Complaint  Patient presents with  . Follow-up    Pt following for hypoxemia: pt states shes doing pretty good. pt states she is not doing to well with the CPAP. Pt unsure of how she should be uisng it. pt states she is not using it as often she only uses it in the morning just sitting in bed. DME: [facility]    History of Present Illness: [name] is a 81 y.o. female with chronic cough from asthma, chronic Rt hemidiaphragm elevation, OSA, and nocturnal hypoxemia.  Since her last visit she had sleep study.  This showed moderate sleep apnea.  She has since been started on CPAP.  She is having trouble with mask fit.  As a result she has trouble using CPAP at night.  She will try using during the day to get used to it >> still has issue with mask fit.  Her cough is better.  She does not use qvar much.  She will occasionally use albuterol.  She is due for ROV with Dr. Lavon PaganiniNandigam with GI to discuss options for her hiatal hernia.   TESTS: PSG 09/09/14 >> RDI 16.8, SaO2 low 84%, REM AHI 26.7.  PMhx >> CAD, ischemic CM, a/p AICD, HTN, HLD, GERD, HH, Hypothyroidism, Anxiety  Past surgical hx, Medications, Allergies, Family hx, Social hx all reviewed.   Physical Exam: BP 138/82 mmHg  Pulse 80  Ht 4\' 10"  (1.473 m)  Wt 130 lb (58.968 kg)  BMI 27.18 kg/m2  SpO2 95%  General - No distress ENT - No sinus tenderness, no oral exudate, no LAN Cardiac - s1s2 regular, no murmur Chest - No wheeze/rales/dullness Back - No focal tenderness Abd - Soft, non-tender Ext - No edema Neuro - Normal strength Skin - No rashes Psych - normal mood, and behavior   Assessment/Plan:  Chronic cough from asthma, and reflux. Improved. Plan: - prn albuterol - she is to resume Qvar if she needs albuterol frequently  GERD, Hiatal hernia. Plan: - f/u with GI  Obstructive sleep apnea. Plan: - discussed options to get her used to sleeping with CPAP - will arrange for CPAP mask refit - if she is not  able to tolerate CPAP, then might need to transition to oxygen at night   Coralyn HellingVineet Alyjah Lovingood, MD [facility] Pager:  [phone]

## 2014-11-02 ENCOUNTER — Encounter: Payer: Medicare Other | Admitting: *Deleted

## 2014-11-02 ENCOUNTER — Telehealth: Payer: Self-pay | Admitting: *Deleted

## 2014-11-03 ENCOUNTER — Encounter: Payer: Self-pay | Admitting: Cardiology

## 2014-11-07 ENCOUNTER — Other Ambulatory Visit: Payer: Self-pay | Admitting: Family Medicine

## 2014-11-08 ENCOUNTER — Ambulatory Visit: Payer: Medicare Other | Admitting: Gastroenterology

## 2014-11-09 ENCOUNTER — Ambulatory Visit (INDEPENDENT_AMBULATORY_CARE_PROVIDER_SITE_OTHER): Payer: Medicare Other | Admitting: Medical

## 2014-11-09 ENCOUNTER — Encounter: Payer: Self-pay | Admitting: Medical

## 2014-11-09 VITALS — BP 124/70 | HR 65 | Temp 97.1°F | Ht 59.0 in | Wt 130.0 lb

## 2014-11-09 DIAGNOSIS — L089 Local infection of the skin and subcutaneous tissue, unspecified: Secondary | ICD-10-CM

## 2014-11-09 MED ORDER — DOXYCYCLINE HYCLATE 100 MG PO TABS: 100.0000 mg | ORAL_TABLET | Freq: Two times a day (BID) | ORAL | Status: DC

## 2014-11-09 NOTE — Patient Instructions (Signed)
With your tender area on left side of nose this is likey early cellulitis. I will rx doxycycline 1 tab by mouth  twice a day.   Warm compress to area twice daily.  If not significantly improved or still some tenderness by Friday then please be seen before the weekend. May need to give IM antibiotic.

## 2014-11-09 NOTE — Progress Notes (Signed)
Subjective:    Patient ID: [name], female    DOB: June 09, 1921, 81 y.o.   MRN: [id number]  HPI  Pt in states left side of her nose feels swollen and lt maxillary sinus tenderness for about 4 days. No injury, no trauma or any preceding upper respiratory symptoms.  Pt states 50 yrs ago she infection in this area area formed abscess that needed to be drained. Pt has not been putting anything on area.    Review of Systems  Constitutional: Negative for fever, chills and fatigue.  Respiratory: Negative for cough, choking and stridor.   Cardiovascular: Negative for chest pain and palpitations.  Gastrointestinal: Negative for abdominal pain.  Musculoskeletal: Negative for back pain.  Skin:       Lt side of nose mild swollen and tender. Some induration.  Neurological: Negative for dizziness, seizures, speech difficulty, weakness, numbness and headaches.  Hematological: Negative for adenopathy. Does not bruise/bleed easily.     Past Medical History  Diagnosis Date  . Chest pain 12/09    recent hospitalization for it. felt to be noncardiac.   [name] CAD (coronary artery disease)     a. s/p prior anterior wall myocardial infarction and multiple percutaneous coronary intervention.;  b. Aden MV 1/14:  low risk, inf and apical defect likely due to atten artifact, no ischemia, EF 35% (visually normal and est 55-60%)  . Ischemic cardiomyopathy     EF 25-30% improved to 45% with biventricular pacing.   . Systolic heart failure     improved to class I to II   . Automatic implantable cardioverter/defibrillator (AICD) activation     s/p implantable cardioverter-defibrillator bivenrticular pacer. 12/05. St, Jude Medtronic. remote-no.  [name] HTN (hypertension)   . Hyperlipidemia   . GERD (gastroesophageal reflux disease)   . Esophageal stricture     last dialated 2007  . Hiatal hernia   . DD (diverticular disease)     Hx of it - severe. Left colon 2004  . Other urinary problems     urinary  incontenance  . UTI (lower urinary tract infection)     recurrent. (Klebsiella pneumoniae). Last Cx 02/03/09. (R-ancef, nitrofurantoin, Augmentin, Zyosyn)  . Arthritis   . PVT (paroxysmal ventricular tachycardia) (HCC)   . CHF (congestive heart failure) (HCC)   . Hypothyroidism   . Anxiety     sleep  . Pacemaker   . Automatic implantable cardioverter-defibrillator in situ   . Heart murmur   . History of recurrent UTIs   . Shortness of breath     with exertion  . Urgency of urination   . Frequent urination at night     Social History   Social History  . Marital Status: Widowed    Spouse Name: N/A  . Number of Children: N/A  . Years of Education: N/A   Occupational History  . Retired    Social History Main Topics  . Smoking status: Never Smoker   . Smokeless tobacco: Never Used  . Alcohol Use: No  . Drug Use: No  . Sexual Activity: Not on file   Other Topics Concern  . Not on file   Social History Narrative   Widowed, husband died of brain tumor. Son lives in [place], [place]- was in a MVA for DWI. He has had a long hx of alcohol problems, she has helped him out financially in the past but is ready to be much stricter. Pt. Has a daughter who live in [place] with Breast Ca.  She also lost a son to a brain tumor.     Past Surgical History  Procedure Laterality Date  . Angioplasty      stent  . Unspecified area hysterectomy    . Tubal ligation    . Appendectomy    . Cardiac defibrillator placement      [facility]  . Cataract extraction, bilateral    . Total knee arthroplasty      bilateral  . Tonsillectomy    . Hammertoe reconstruction with weil osteotomy Right 07/17/2012    Procedure: RIGHT SECOND AND THIRD MT WEIL OSTEOTOMIES AND SECOND AND THIRD HAMMERTOE CORRECTIONS;  Surgeon: Toni Arthurs, MD;  Location: [facility] OR;  Service: Orthopedics;  Laterality: Right;  . Colonoscopy w/ polypectomy    . Cardiac catheterization      stent  . Insert / replace / remove pacemaker    .  Hammer toe surgery Left 01/15/2013    Procedure: LEFT SECOND THROUGH FOURTH HAMMERTOE CORRECTION ;  Surgeon: Toni Arthurs, MD;  Location: [facility] OR;  Service: Orthopedics;  Laterality: Left;  . Weil osteotomy Left 01/15/2013    Procedure:  WEIL OSTEOTOMY AND DORSAL CAPSULOTOMY;  Surgeon: Toni Arthurs, MD;  Location: [facility] OR;  Service: Orthopedics;  Laterality: Left;    Family History  Problem Relation Age of Onset  . Colon cancer Neg Hx   . Colon polyps Sister   . Heart disease Mother   . Heart disease Father   . Heart disease Brother     multiple brothers  . Heart disease Sister     multiple sisters  . Prostate cancer Brother   . Diabetes Daughter   . Diabetes      aunt  . Breast cancer Daughter   . Alcohol abuse Son   . Brain cancer Son   . Lung cancer Son     Allergies  Allergen Reactions  . Sulfonamide Derivatives     REACTION: rash and itching    Current Outpatient Prescriptions on File Prior to Visit  Medication Sig Dispense Refill  . albuterol (PROVENTIL HFA;VENTOLIN HFA) 108 (90 BASE) MCG/ACT inhaler Inhale 2 puffs into the lungs every 6 (six) hours as needed for wheezing or shortness of breath. 1 Inhaler 2  . aspirin (ASPIR-81) 81 MG EC tablet Take 81 mg by mouth daily.      . furosemide (LASIX) 20 MG tablet Take 1 tablet (20 mg total) by mouth daily as needed for fluid or edema. 90 tablet 1  . isosorbide mononitrate (IMDUR) 30 MG 24 hr tablet TAKE 1 AND 1/2 TABLETS EVERY MORNING 45 tablet 6  . levothyroxine (SYNTHROID, LEVOTHROID) 100 MCG tablet TAKE 1 TABLET (100 MCG TOTAL) BY MOUTH DAILY. 90 tablet 1  . losartan (COZAAR) 50 MG tablet Take 1 tablet (50 mg total) by mouth daily. 30 tablet 5  . metoprolol succinate (TOPROL-XL) 50 MG 24 hr tablet TAKE 1 TABLET (50 MG TOTAL) BY MOUTH 2 (TWO) TIMES DAILY. TAKE WITH OR IMMEDIATELY FOLLOWING A MEAL. 60 tablet 3  . nitroGLYCERIN (NITROSTAT) 0.4 MG SL tablet Place 1 tablet (0.4 mg total) under the tongue every 5 (five) minutes as  needed. At the onset of chest pain, Up to 3 doses 10 tablet 0  . pravastatin (PRAVACHOL) 20 MG tablet Take 1 tablet (20 mg total) by mouth daily. 90 tablet 1  . Psyllium (METAFIBER) 48.57 % POWD Take 10 mLs by mouth every evening.     [name] QVAR 40 MCG/ACT inhaler Inhale 2  puffs into the lungs daily. 1 Inhaler 2  . ranitidine (ZANTAC) 150 MG tablet Take 1 tablet (150 mg total) by mouth daily. 30 tablet 3   No current facility-administered medications on file prior to visit.    BP 124/70 mmHg  Pulse 65  Temp(Src) 97.1 F (36.2 C) (Oral)  Ht 4\' 11"  (1.499 m)  Wt 130 lb (58.968 kg)  BMI 26.24 kg/m2  SpO2 97%       Objective:   Physical Exam  General  Mental Status - Alert. General Appearance - Well groomed. Not in acute distress.  Skin On left side of her nose. She has small area of faint redness, indurated and tender to palpation. No fluctuance or soft center.  HEENT Head- Normal. Ear Auditory Canal - Left- Normal. Right - Normal.Tympanic Membrane- Left- Normal. Right- Normal. Eye Sclera/Conjunctiva- Left- Normal. Right- Normal. Nose & Sinuses Nasal Mucosa- Left-  Not boggy or Congested(no swelling seen). Right-  Not  boggy or Congested. Mouth & Throat Lips: Upper Lip- Normal: no dryness, cracking, pallor, cyanosis, or vesicular eruption. Lower Lip-Normal: no dryness, cracking, pallor, cyanosis or vesicular eruption. Buccal Mucosa- Bilateral- No Aphthous ulcers. Oropharynx- No Discharge or Erythema. Tonsils: Characteristics- Bilateral- No Erythema or Congestion. Size/Enlargement- Bilateral- No enlargement. Discharge- bilateral-None.  Neck Neck- Supple. No Masses.   Chest and Lung Exam Auscultation: Breath Sounds:- even and unlabored, but bilateral upper lobe rhonchi.  Cardiovascular Auscultation:Rythm- Regular, rate and rhythm. Murmurs & Other Heart Sounds:Ausculatation of the heart reveal- No Murmurs.  Lymphatic Head & Neck General Head & Neck  Lymphatics: Bilateral: Description- No Localized lymphadenopathy.       Assessment & Plan:  With your tender area on left side of nose this is likey early cellulitis. I will rx doxycycline 1 tab by mouth  twice a day.   Warm compress to area twice daily.  If not significantly improved or still some tenderness by Friday then please be seen before the weekend. May need to give IM antibiotic.

## 2014-11-09 NOTE — Progress Notes (Signed)
Pre visit review using our clinic review tool, if applicable. No additional management support is needed unless otherwise documented below in the visit note. 

## 2014-11-25 ENCOUNTER — Other Ambulatory Visit: Payer: Self-pay | Admitting: Family Medicine

## 2014-12-22 ENCOUNTER — Telehealth: Payer: Self-pay | Admitting: Family Medicine

## 2014-12-22 MED ORDER — NITROGLYCERIN 0.4 MG SL SUBL: 0.4000 mg | SUBLINGUAL_TABLET | SUBLINGUAL | Status: DC | PRN

## 2014-12-22 NOTE — Telephone Encounter (Signed)
Caller name: Lupita LeashDonna Relationship to patient: Granddaughter Can be reached: [phone] Pharmacy:  [id number] [place]- [place], [place] - [address] [phone] (Phone) [phone] (Fax)         Reason for call: Request refill on nitroGLYCERIN (NITROSTAT) 0.4 MG SL tablet [[id number]][[id number]] . Granddaughter states that patient has misplaced her bottle

## 2014-12-22 NOTE — Telephone Encounter (Signed)
Rx faxed.    KP 

## 2015-01-04 ENCOUNTER — Ambulatory Visit (INDEPENDENT_AMBULATORY_CARE_PROVIDER_SITE_OTHER): Payer: Medicare Other | Admitting: Gastroenterology

## 2015-01-04 ENCOUNTER — Encounter: Payer: Self-pay | Admitting: Gastroenterology

## 2015-01-04 VITALS — BP 130/78 | HR 78 | Ht <= 58 in | Wt 132.0 lb

## 2015-01-04 DIAGNOSIS — K449 Diaphragmatic hernia without obstruction or gangrene: Secondary | ICD-10-CM | POA: Diagnosis not present

## 2015-01-04 DIAGNOSIS — R1310 Dysphagia, unspecified: Secondary | ICD-10-CM

## 2015-01-04 NOTE — Progress Notes (Signed)
[name]    [id number]    1921-08-11  Primary Care Physician:Yvonne Laury Axon, DO  Referring Physician: Lelon Perla, DO [address] [place], [place] [address]  Chief complaint:  Dysphagia  HPI: As per Dr. Regino Schultze note  81 year old white female with benign distal esophageal stricture which was last time dilated in November 2010. She has a 3 cm hiatal hernia , not taking any acid reducing medications. She had prior dilatations in 1994 when she presented with food impaction. And subsequently in 1996, 2004, 2006 in 2007. Her dysphagia is very mild and does not occur every day. It occurs to liquids as well as solids. There has been no food impaction. She takes buttermilk for heartburn. Last colonoscopy October 2004 shows severe diverticulosis and 8 mm left colon polyp which was removed but not retrieved. She has ischemic cardiomyopathy. Ejection fraction 25-30% improved to 45%.   Patient returns with concern of her ongoing dysphagia to both liquids and solids but worse with dry solid food and pills. No history of food impactions. She tries to sip water with her meals. Her weight is slightly low were due to decreased appetite as she is not eating as larger portions as before with her age.    Outpatient Encounter Prescriptions as of 01/04/2015  Medication Sig  . albuterol (PROVENTIL HFA;VENTOLIN HFA) 108 (90 BASE) MCG/ACT inhaler Inhale 2 puffs into the lungs every 6 (six) hours as needed for wheezing or shortness of breath.  [name] aspirin (ASPIR-81) 81 MG EC tablet Take 81 mg by mouth daily.    [name] doxycycline (VIBRA-TABS) 100 MG tablet Take 1 tablet (100 mg total) by mouth 2 (two) times daily.  . furosemide (LASIX) 20 MG tablet Take 1 tablet (20 mg total) by mouth daily as needed for fluid or edema.  . isosorbide mononitrate (IMDUR) 30 MG 24 hr tablet TAKE 1 AND 1/2 TABLETS EVERY MORNING  . levothyroxine (SYNTHROID, LEVOTHROID) 100 MCG tablet TAKE 1 TABLET (100  MCG TOTAL) BY MOUTH DAILY.  [name] losartan (COZAAR) 50 MG tablet Take 1 tablet (50 mg total) by mouth daily.  . metoprolol succinate (TOPROL-XL) 50 MG 24 hr tablet TAKE 1 TABLET (50 MG TOTAL) BY MOUTH 2 (TWO) TIMES DAILY. TAKE WITH OR IMMEDIATELY FOLLOWING A MEAL.  . nitroGLYCERIN (NITROSTAT) 0.4 MG SL tablet Place 1 tablet (0.4 mg total) under the tongue every 5 (five) minutes as needed. At the onset of chest pain, Up to 3 doses  . NON FORMULARY C-Pap machine, uses nightly for sleep apnea  . pravastatin (PRAVACHOL) 20 MG tablet Take 1 tablet (20 mg total) by mouth daily.  . Psyllium (METAFIBER) 48.57 % POWD Take 10 mLs by mouth every evening.   [name] QVAR 40 MCG/ACT inhaler Inhale 2 puffs into the lungs daily.  . ranitidine (ZANTAC) 150 MG tablet Take 1 tablet (150 mg total) by mouth daily.   No facility-administered encounter medications on file as of 01/04/2015.    Allergies as of 01/04/2015 - Review Complete 01/04/2015  Allergen Reaction Noted  . Sulfonamide derivatives  03/01/2009    Past Medical History  Diagnosis Date  . Chest pain 12/09    recent hospitalization for it. felt to be noncardiac.   [name] CAD (coronary artery disease)     a. s/p prior anterior wall myocardial infarction and multiple percutaneous coronary intervention.;  b. Aden MV 1/14:  low risk, inf and apical defect likely due  to atten artifact, no ischemia, EF 35% (visually normal and est 55-60%)  . Ischemic cardiomyopathy     EF 25-30% improved to 45% with biventricular pacing.   . Systolic heart failure     improved to class I to II   . Automatic implantable cardioverter/defibrillator (AICD) activation     s/p implantable cardioverter-defibrillator bivenrticular pacer. 12/05. St, Jude Medtronic. remote-no.  [name] HTN (hypertension)   . Hyperlipidemia   . GERD (gastroesophageal reflux disease)   . Esophageal stricture     last dialated 2007  . Hiatal hernia   . DD (diverticular disease)     Hx of it - severe. Left colon 2004   . Other urinary problems     urinary incontenance  . UTI (lower urinary tract infection)     recurrent. (Klebsiella pneumoniae). Last Cx 02/03/09. (R-ancef, nitrofurantoin, Augmentin, Zyosyn)  . Arthritis   . PVT (paroxysmal ventricular tachycardia) (HCC)   . CHF (congestive heart failure) (HCC)   . Hypothyroidism   . Anxiety     sleep  . Pacemaker   . Automatic implantable cardioverter-defibrillator in situ   . Heart murmur   . History of recurrent UTIs   . Shortness of breath     with exertion  . Urgency of urination   . Frequent urination at night   . Sleep apnea     has a Cpap machine    Past Surgical History  Procedure Laterality Date  . Angioplasty      stent  . Unspecified area hysterectomy    . Tubal ligation    . Appendectomy    . Cardiac defibrillator placement      [facility]  . Cataract extraction, bilateral    . Total knee arthroplasty      bilateral  . Tonsillectomy    . Hammertoe reconstruction with weil osteotomy Right 07/17/2012    Procedure: RIGHT SECOND AND THIRD MT WEIL OSTEOTOMIES AND SECOND AND THIRD HAMMERTOE CORRECTIONS;  Surgeon: Toni Arthurs, MD;  Location: [facility] OR;  Service: Orthopedics;  Laterality: Right;  . Colonoscopy w/ polypectomy    . Cardiac catheterization      stent  . Insert / replace / remove pacemaker    . Hammer toe surgery Left 01/15/2013    Procedure: LEFT SECOND THROUGH FOURTH HAMMERTOE CORRECTION ;  Surgeon: Toni Arthurs, MD;  Location: [facility] OR;  Service: Orthopedics;  Laterality: Left;  . Weil osteotomy Left 01/15/2013    Procedure:  WEIL OSTEOTOMY AND DORSAL CAPSULOTOMY;  Surgeon: Toni Arthurs, MD;  Location: [facility] OR;  Service: Orthopedics;  Laterality: Left;    Family History  Problem Relation Age of Onset  . Colon cancer Neg Hx   . Colon polyps Sister   . Heart disease Mother   . Heart disease Father   . Heart disease Brother     multiple brothers  . Heart disease Sister     multiple sisters  . Prostate cancer Brother     . Diabetes Daughter   . Diabetes      aunt  . Breast cancer Daughter   . Alcohol abuse Son   . Brain cancer Son   . Lung cancer Son     Social History   Social History  . Marital Status: Widowed    Spouse Name: N/A  . Number of Children: 4  . Years of Education: N/A   Occupational History  . Retired    Social History Main Topics  . Smoking status: Never  Smoker   . Smokeless tobacco: Never Used  . Alcohol Use: No  . Drug Use: No  . Sexual Activity: Not on file   Other Topics Concern  . Not on file   Social History Narrative   Widowed, husband died of brain tumor. Son [place]- was in a MVA for DWI. He has had a long hx of alcohol problems, she has helped him out financially in the past but is ready to be much stricter. Pt. Has a daughter who live in [place] with Breast Ca. She also lost a son to a brain tumor.       Review of systems: Review of Systems  Constitutional: Negative for fever and chills.  HENT: Negative.   Eyes: Negative for blurred vision.  Respiratory: Negative for cough, shortness of breath and wheezing.   Cardiovascular: Negative for chest pain and palpitations.  Gastrointestinal: as per HPI Genitourinary: Negative for dysuria, urgency, frequency and hematuria.  Musculoskeletal: Negative for myalgias, back pain and joint pain.  Skin: Negative for itching and rash.  Neurological: Negative for dizziness, tremors, focal weakness, seizures and loss of consciousness.  Endo/Heme/Allergies: Negative for environmental allergies.  Psychiatric/Behavioral: Negative for depression, suicidal ideas and hallucinations.  All other systems reviewed and are negative.   Physical Exam: Filed Vitals:   01/04/15 1340  BP: 130/78  Pulse: 78   Gen:      No acute distress HEENT:  EOMI, sclera anicteric Neck:     No masses; no thyromegaly Lungs:    Clear to auscultation bilaterally; normal respiratory effort CV:         Regular rate and rhythm; no  murmurs Abd:      + bowel sounds; soft, non-tender; no palpable masses, no distension Ext:    No edema; adequate peripheral perfusion Skin:      Warm and dry; no rash Neuro: alert and oriented x 3 Psych: normal mood and affect  Data Reviewed:  esophagogram September 2016 Contrast passed freely from the esophagus into the stomach. There is marked esophageal dysmotility with tertiary contractions and intra-esophageal reflux. There is a large hiatal hernia which opacifies with contrast. The barium tablet did not pass from the hiatal hernia into the stomach with augmentation including water and barium contrast. Spontaneous full column esophageal reflux.   Assessment and Plan/Recommendations:  81 year old female with history of hiatal hernia and distal esophageal spot stricture here with complaints of dysphagia to both liquids and solids. I reviewed her esophagogram and prior endoscopies, patient appears to have a large hiatal hernia which is likely contribution to her symptoms rather than a esophageal stricture. The barium tablet did not pass from the hiatal hernia into the stomach. Advised patient to modify her diet, avoid meat and bread. Incorporate more pured diet or smoothies to improve caloric intake We'll not recommend repair of hiatal hernia given her age and co morbidities Return as needed  [name] , MD [phone] Mon-Fri 8a-5p [phone] after 5p, weekends, holidays

## 2015-01-04 NOTE — Patient Instructions (Signed)
Follow up as needed

## 2015-01-05 ENCOUNTER — Encounter: Payer: Self-pay | Admitting: *Deleted

## 2015-01-07 ENCOUNTER — Ambulatory Visit (INDEPENDENT_AMBULATORY_CARE_PROVIDER_SITE_OTHER): Payer: Medicare Other | Admitting: Family Medicine

## 2015-01-07 ENCOUNTER — Encounter: Payer: Self-pay | Admitting: Family Medicine

## 2015-01-07 VITALS — BP 132/84 | HR 80 | Temp 97.6°F | Ht <= 58 in | Wt 132.4 lb

## 2015-01-07 DIAGNOSIS — E785 Hyperlipidemia, unspecified: Secondary | ICD-10-CM

## 2015-01-07 DIAGNOSIS — E039 Hypothyroidism, unspecified: Secondary | ICD-10-CM

## 2015-01-07 DIAGNOSIS — H9193 Unspecified hearing loss, bilateral: Secondary | ICD-10-CM | POA: Diagnosis not present

## 2015-01-07 DIAGNOSIS — I10 Essential (primary) hypertension: Secondary | ICD-10-CM | POA: Diagnosis not present

## 2015-01-07 NOTE — Progress Notes (Signed)
Pre visit review using our clinic review tool, if applicable. No additional management support is needed unless otherwise documented below in the visit note. 

## 2015-01-07 NOTE — Progress Notes (Signed)
Patient ID: [name], female    DOB: 25-Jul-1921  Age: 81 y.o. MRN: [id number]    Subjective:  Subjective HPI [name] presents for f/u and to discuss her heart.  She wants to know what she can do activity wise.     Review of Systems  Constitutional: Negative for diaphoresis, appetite change, fatigue and unexpected weight change.  Eyes: Negative for pain, redness and visual disturbance.  Respiratory: Negative for cough, chest tightness, shortness of breath and wheezing.   Cardiovascular: Negative for chest pain, palpitations and leg swelling.  Endocrine: Negative for cold intolerance, heat intolerance, polydipsia, polyphagia and polyuria.  Genitourinary: Negative for dysuria, frequency and difficulty urinating.  Neurological: Negative for dizziness, light-headedness, numbness and headaches.    History Past Medical History  Diagnosis Date  . Chest pain 12/09    recent hospitalization for it. felt to be noncardiac.   [name] CAD (coronary artery disease)     a. s/p prior anterior wall myocardial infarction and multiple percutaneous coronary intervention.;  b. Aden MV 1/14:  low risk, inf and apical defect likely due to atten artifact, no ischemia, EF 35% (visually normal and est 55-60%)  . Ischemic cardiomyopathy     EF 25-30% improved to 45% with biventricular pacing.   . Systolic heart failure     improved to class I to II   . Automatic implantable cardioverter/defibrillator (AICD) activation     s/p implantable cardioverter-defibrillator bivenrticular pacer. 12/05. St, Jude Medtronic. remote-no.  [name] HTN (hypertension)   . Hyperlipidemia   . GERD (gastroesophageal reflux disease)   . Esophageal stricture     last dialated 2007  . Hiatal hernia   . DD (diverticular disease)     Hx of it - severe. Left colon 2004  . Other urinary problems     urinary incontenance  . UTI (lower urinary tract infection)     recurrent. (Klebsiella pneumoniae). Last Cx 02/03/09. (R-ancef,  nitrofurantoin, Augmentin, Zyosyn)  . Arthritis   . PVT (paroxysmal ventricular tachycardia) (HCC)   . CHF (congestive heart failure) (HCC)   . Hypothyroidism   . Anxiety     sleep  . Pacemaker   . Automatic implantable cardioverter-defibrillator in situ   . Heart murmur   . History of recurrent UTIs   . Shortness of breath     with exertion  . Urgency of urination   . Frequent urination at night   . Sleep apnea     has a Cpap machine    She has past surgical history that includes Angioplasty; unspecified area hysterectomy; Tubal ligation; Appendectomy; Cardiac defibrillator placement; Cataract extraction, bilateral; Total knee arthroplasty; Tonsillectomy; Hammertoe reconstruction with weil osteotomy (Right, 07/17/2012); Colonoscopy w/ polypectomy; Cardiac catheterization; Insert / replace / remove pacemaker; Hammer toe surgery (Left, 01/15/2013); and Weil osteotomy (Left, 01/15/2013).   Her family history includes Alcohol abuse in her son; Brain cancer in her son; Breast cancer in her daughter; Colon polyps in her sister; Diabetes in her daughter; Heart disease in her brother, father, mother, and sister; Lung cancer in her son; Prostate cancer in her brother. There is no history of Colon cancer.She reports that she has never smoked. She has never used smokeless tobacco. She reports that she does not drink alcohol or use illicit drugs.  Current Outpatient Prescriptions on File Prior to Visit  Medication Sig Dispense Refill  . albuterol (PROVENTIL HFA;VENTOLIN HFA) 108 (90 BASE) MCG/ACT inhaler Inhale 2 puffs into the lungs every 6 (six) hours  as needed for wheezing or shortness of breath. 1 Inhaler 2  . aspirin (ASPIR-81) 81 MG EC tablet Take 81 mg by mouth daily.      [name] doxycycline (VIBRA-TABS) 100 MG tablet Take 1 tablet (100 mg total) by mouth 2 (two) times daily. 20 tablet 0  . furosemide (LASIX) 20 MG tablet Take 1 tablet (20 mg total) by mouth daily as needed for fluid or edema. 90  tablet 1  . isosorbide mononitrate (IMDUR) 30 MG 24 hr tablet TAKE 1 AND 1/2 TABLETS EVERY MORNING 45 tablet 6  . levothyroxine (SYNTHROID, LEVOTHROID) 100 MCG tablet TAKE 1 TABLET (100 MCG TOTAL) BY MOUTH DAILY. 90 tablet 1  . losartan (COZAAR) 50 MG tablet Take 1 tablet (50 mg total) by mouth daily. 30 tablet 5  . metoprolol succinate (TOPROL-XL) 50 MG 24 hr tablet TAKE 1 TABLET (50 MG TOTAL) BY MOUTH 2 (TWO) TIMES DAILY. TAKE WITH OR IMMEDIATELY FOLLOWING A MEAL. 60 tablet 5  . nitroGLYCERIN (NITROSTAT) 0.4 MG SL tablet Place 1 tablet (0.4 mg total) under the tongue every 5 (five) minutes as needed. At the onset of chest pain, Up to 3 doses 10 tablet 0  . NON FORMULARY C-Pap machine, uses nightly for sleep apnea    . pravastatin (PRAVACHOL) 20 MG tablet Take 1 tablet (20 mg total) by mouth daily. 90 tablet 1  . Psyllium (METAFIBER) 48.57 % POWD Take 10 mLs by mouth every evening.     [name] QVAR 40 MCG/ACT inhaler Inhale 2 puffs into the lungs daily. 1 Inhaler 2  . ranitidine (ZANTAC) 150 MG tablet Take 1 tablet (150 mg total) by mouth daily. 30 tablet 3   No current facility-administered medications on file prior to visit.     Objective:  Objective Physical Exam  Constitutional: She is oriented to person, place, and time. She appears well-developed and well-nourished.  HENT:  Head: Normocephalic and atraumatic.  Eyes: Conjunctivae and EOM are normal.  Neck: Normal range of motion. Neck supple. No JVD present. Carotid bruit is not present. No thyromegaly present.  Cardiovascular: Normal rate, regular rhythm and normal heart sounds.   No murmur heard. Pulmonary/Chest: Effort normal and breath sounds normal. No respiratory distress. She has no wheezes. She has no rales. She exhibits no tenderness.  Musculoskeletal: She exhibits no edema.  Neurological: She is alert and oriented to person, place, and time.  Psychiatric: She has a normal mood and affect.  Nursing note and vitals  reviewed.  BP 132/84 mmHg  Pulse 80  Temp(Src) 97.6 F (36.4 C) (Oral)  Ht '4\' 10"'$  (1.473 m)  Wt 132 lb 6.4 oz (60.056 kg)  BMI 27.68 kg/m2  SpO2 95% Wt Readings from Last 3 Encounters:  01/07/15 132 lb 6.4 oz (60.056 kg)  01/04/15 132 lb (59.875 kg)  11/09/14 130 lb (58.968 kg)     Lab Results  Component Value Date   WBC 7.0 08/11/2014   HGB 13.4 08/11/2014   HCT 39.9 08/11/2014   PLT 207.0 08/11/2014   GLUCOSE 76 08/30/2014   CHOL 136 08/30/2014   TRIG 103.0 08/30/2014   HDL 43.70 08/30/2014   LDLCALC 72 08/30/2014   ALT 14 08/30/2014   AST 22 08/30/2014   NA 138 08/30/2014   K 4.4 08/30/2014   CL 102 08/30/2014   CREATININE 0.64 08/30/2014   BUN 13 08/30/2014   CO2 31 08/30/2014   TSH 1.90 08/30/2014   INR 0.9 ratio 11/14/2009   HGBA1C  01/02/2008  6.1 (NOTE)   The ADA recommends the following therapeutic goal for glycemic   control related to Hgb A1C measurement:   Goal of Therapy:   < 7.0% Hgb A1C   Reference: American Diabetes Association: Clinical Practice   Recommendations 2008, Diabetes Care,  2008, 31:(Suppl 1).    Dg Esophagus  09/09/2014  CLINICAL DATA:  Patient with history of esophageal strictures hiatal hernia. Dysphagia. EXAM: ESOPHOGRAM / BARIUM SWALLOW / BARIUM TABLET STUDY TECHNIQUE: Combined double contrast and single contrast examination performed using effervescent crystals, thick barium liquid, and thin barium liquid. The patient was observed with fluoroscopy swallowing a 13 mm barium sulphate tablet. FLUOROSCOPY TIME:  Fluoroscopy Time:  4 minutes, 42 seconds COMPARISON:  None. FINDINGS: Contrast passed freely from the esophagus into the stomach. There is marked esophageal dysmotility with tertiary contractions and intra-esophageal reflux. There is a large hiatal hernia which opacifies with contrast. The barium tablet did not pass from the hiatal hernia into the stomach with augmentation including water and barium contrast. Spontaneous full column  esophageal reflux. IMPRESSION: Large hiatal hernia. Barium tablet did not pass from the hiatal hernia into the stomach. Spontaneous full column gastroesophageal reflux. Marked esophageal dysmotility. Electronically Signed   By: Lovey Newcomer M.D.   On: 09/09/2014 12:15     Assessment & Plan:  Plan I am having Ms. [name] maintain her Psyllium, aspirin, furosemide, ranitidine, isosorbide mononitrate, losartan, levothyroxine, QVAR, albuterol, pravastatin, doxycycline, metoprolol succinate, nitroGLYCERIN, and NON FORMULARY.  No orders of the defined types were placed in this encounter.    Problem List Items Addressed This Visit    Hypothyroid   Relevant Orders   TSH   Hyperlipidemia   Relevant Orders   Comp Met (CMET)   Lipid panel    Other Visit Diagnoses    Hearing loss, bilateral    -  Primary    Relevant Orders    Ambulatory referral to Audiology    Essential hypertension         pt to rto for labs -- after she gets back from the beach.  She wanted our blessing to try to be more active---- she can advance her activity as tolerated.  Pt understands and is thankful.    Follow-up: Return in about 6 months (around 07/08/2015), or if symptoms worsen or fail to improve, for annual exam, fasting.  Garnet Koyanagi, DO

## 2015-01-07 NOTE — Patient Instructions (Addendum)
You can do whatever you want to do as long as you are in a safe place and do not over do.      Cholesterol Cholesterol is a white, waxy, fat-like substance needed by your body in small amounts. The liver makes all the cholesterol you need. Cholesterol is carried from the liver by the blood through the blood vessels. Deposits of cholesterol (plaque) may build up on blood vessel walls. These make the arteries narrower and stiffer. Cholesterol plaques increase the risk for heart attack and stroke.  You cannot feel your cholesterol level even if it is very high. The only way to know it is high is with a blood test. Once you know your cholesterol levels, you should keep a record of the test results. Work with your health care provider to keep your levels in the desired range.  WHAT DO THE RESULTS MEAN?  Total cholesterol is a rough measure of all the cholesterol in your blood.   LDL is the so-called bad cholesterol. This is the type that deposits cholesterol in the walls of the arteries. You want this level to be low.   HDL is the good cholesterol because it cleans the arteries and carries the LDL away. You want this level to be high.  Triglycerides are fat that the body can either burn for energy or store. High levels are closely linked to heart disease.  WHAT ARE THE DESIRED LEVELS OF CHOLESTEROL?  Total cholesterol below 200.   LDL below 100 for people at risk, below 70 for those at very high risk.   HDL above 50 is good, above 60 is best.   Triglycerides below 150.  HOW CAN I LOWER MY CHOLESTEROL?  Diet. Follow your diet programs as directed by your health care provider.   Choose fish or white meat chicken and [place], roasted or baked. Limit fatty cuts of red meat, fried foods, and processed meats, such as sausage and lunch meats.   Eat lots of fresh fruits and vegetables.  Choose whole grains, beans, pasta, potatoes, and cereals.   Use only small amounts of olive, corn,  or canola oils.   Avoid butter, mayonnaise, shortening, or palm kernel oils.  Avoid foods with trans fats.   Drink skim or nonfat milk and eat low-fat or nonfat yogurt and cheeses. Avoid whole milk, cream, ice cream, egg yolks, and full-fat cheeses.   Healthy desserts include angel food cake, ginger snaps, animal crackers, hard candy, popsicles, and low-fat or nonfat frozen yogurt. Avoid pastries, cakes, pies, and cookies.   Exercise. Follow your exercise programs as directed by your health care provider.   A regular program helps decrease LDL and raise HDL.   A regular program helps with weight control.   Do things that increase your activity level like gardening, walking, or taking the stairs. Ask your health care provider about how you can be more active in your daily life.   Medicine. Take medicine only as directed by your health care provider.   Medicine may be prescribed by your health care provider to help lower cholesterol and decrease the risk for heart disease.   If you have several risk factors, you may need medicine even if your levels are normal.   This information is not intended to replace advice given to you by your health care provider. Make sure you discuss any questions you have with your health care provider.   Document Released: 09/19/2000 Document Revised: 01/15/2014 Document Reviewed: 10/08/2012 Elsevier Interactive Patient  Education 2016 [employer].

## 2015-01-11 DIAGNOSIS — H903 Sensorineural hearing loss, bilateral: Secondary | ICD-10-CM | POA: Diagnosis not present

## 2015-01-12 NOTE — Telephone Encounter (Signed)
SPOKE TO MS Kyser ABOUT TRANSMISSON  AND WAS TRANSFERRED TO DEVICE TECH FOR MORE ANSWERS AND BETTER ASSISTANCE WITH CONCERNS

## 2015-01-19 ENCOUNTER — Telehealth: Payer: Self-pay

## 2015-01-19 NOTE — Telephone Encounter (Signed)
Pt dtr called back, scheduled for 01/28/15 with nurse

## 2015-01-19 NOTE — Telephone Encounter (Signed)
LVM advising patient to call back to schedule appointment.  °

## 2015-01-19 NOTE — Telephone Encounter (Signed)
As of 01/14/15 Prolia benefits verified and Benefits are subject to a $183 dollar deductible ($0 met) and 20% co-insurance for the administration and cost of the Prolia. No PA required. The secondary plan: the provider is in network for this patient's plan. The secondary plan follow Medicare guidelines. This plan will consider the Medicare Part B co-insurance up to primary plans allowable but dies not cover the Medicare Part B deductible. Claims will crossover for coordination. No PA required.      KP

## 2015-01-19 NOTE — Telephone Encounter (Signed)
Rescheduled prolia inj to 02/04/15 and lab appt to 01/28/15

## 2015-01-19 NOTE — Telephone Encounter (Signed)
She will need to have her BMP done first, will you schedule a lab. We need to check the kidney functions and make sure they are normal.  The order is in.  [place]

## 2015-01-28 ENCOUNTER — Other Ambulatory Visit: Payer: Medicare Other

## 2015-01-28 ENCOUNTER — Ambulatory Visit: Payer: Medicare Other

## 2015-02-04 ENCOUNTER — Other Ambulatory Visit (INDEPENDENT_AMBULATORY_CARE_PROVIDER_SITE_OTHER): Payer: Medicare Other

## 2015-02-04 ENCOUNTER — Ambulatory Visit: Payer: Medicare Other

## 2015-02-04 DIAGNOSIS — E039 Hypothyroidism, unspecified: Secondary | ICD-10-CM | POA: Diagnosis not present

## 2015-02-04 DIAGNOSIS — E785 Hyperlipidemia, unspecified: Secondary | ICD-10-CM | POA: Diagnosis not present

## 2015-02-04 LAB — TSH: TSH: 5.04 u[IU]/mL — ABNORMAL HIGH (ref 0.35–4.50)

## 2015-02-04 LAB — COMPREHENSIVE METABOLIC PANEL
ALBUMIN: 4.2 g/dL (ref 3.5–5.2)
ALK PHOS: 70 U/L (ref 39–117)
ALT: 12 U/L (ref 0–35)
AST: 20 U/L (ref 0–37)
BILIRUBIN TOTAL: 0.7 mg/dL (ref 0.2–1.2)
BUN: 21 mg/dL (ref 6–23)
CALCIUM: 9.9 mg/dL (ref 8.4–10.5)
CO2: 30 mEq/L (ref 19–32)
CREATININE: 0.81 mg/dL (ref 0.40–1.20)
Chloride: 102 mEq/L (ref 96–112)
GFR: 70.32 mL/min (ref >60.00)
Glucose, Bld: 94 mg/dL (ref 70–99)
Potassium: 4.4 mEq/L (ref 3.5–5.1)
Sodium: 137 mEq/L (ref 135–145)
TOTAL PROTEIN: 6.5 g/dL (ref 6.0–8.3)

## 2015-02-04 LAB — LIPID PANEL
CHOL/HDL RATIO: 3
CHOLESTEROL: 114 mg/dL (ref 0–200)
HDL: 40.9 mg/dL (ref >39.00)
LDL Cholesterol: 55 mg/dL (ref 0–99)
NonHDL: 73.21
TRIGLYCERIDES: 90 mg/dL (ref 0.0–149.0)
VLDL: 18 mg/dL (ref 0.0–40.0)

## 2015-02-07 MED ORDER — LEVOTHYROXINE SODIUM 112 MCG PO TABS: 112.0000 ug | ORAL_TABLET | Freq: Every day | ORAL | Status: DC

## 2015-02-07 NOTE — Addendum Note (Signed)
Addended by: Neldon Labella on: 02/07/2015 04:48 PM   Modules accepted: Orders, Medications

## 2015-02-09 DIAGNOSIS — H40013 Open angle with borderline findings, low risk, bilateral: Secondary | ICD-10-CM | POA: Diagnosis not present

## 2015-02-09 DIAGNOSIS — H35032 Hypertensive retinopathy, left eye: Secondary | ICD-10-CM | POA: Diagnosis not present

## 2015-02-09 DIAGNOSIS — Z961 Presence of intraocular lens: Secondary | ICD-10-CM | POA: Diagnosis not present

## 2015-02-09 DIAGNOSIS — H35031 Hypertensive retinopathy, right eye: Secondary | ICD-10-CM | POA: Diagnosis not present

## 2015-02-11 ENCOUNTER — Ambulatory Visit (INDEPENDENT_AMBULATORY_CARE_PROVIDER_SITE_OTHER): Payer: Medicare Other | Admitting: *Deleted

## 2015-02-11 DIAGNOSIS — M810 Age-related osteoporosis without current pathological fracture: Secondary | ICD-10-CM | POA: Diagnosis not present

## 2015-02-11 MED ORDER — DENOSUMAB 60 MG/ML ~~LOC~~ SOLN
60.0000 mg | Freq: Once | SUBCUTANEOUS | Status: AC
  Administered 2015-02-11: 60 mg via SUBCUTANEOUS

## 2015-02-11 NOTE — Progress Notes (Signed)
Pre visit review using our clinic review tool, if applicable. No additional management support is needed unless otherwise documented below in the visit note.  Pt tolerated injection well.   Barrie Sigmund J Jalah Warmuth, RN  

## 2015-02-17 ENCOUNTER — Encounter: Payer: Self-pay | Admitting: Pulmonary Disease

## 2015-02-17 ENCOUNTER — Ambulatory Visit (INDEPENDENT_AMBULATORY_CARE_PROVIDER_SITE_OTHER): Payer: Medicare Other | Admitting: Pulmonary Disease

## 2015-02-17 ENCOUNTER — Telehealth: Payer: Self-pay

## 2015-02-17 VITALS — BP 124/72 | HR 78 | Ht <= 58 in | Wt 133.2 lb

## 2015-02-17 DIAGNOSIS — Z9989 Dependence on other enabling machines and devices: Principal | ICD-10-CM

## 2015-02-17 DIAGNOSIS — R05 Cough: Secondary | ICD-10-CM

## 2015-02-17 DIAGNOSIS — R059 Cough, unspecified: Secondary | ICD-10-CM

## 2015-02-17 DIAGNOSIS — G4733 Obstructive sleep apnea (adult) (pediatric): Secondary | ICD-10-CM

## 2015-02-17 DIAGNOSIS — J4530 Mild persistent asthma, uncomplicated: Secondary | ICD-10-CM | POA: Diagnosis not present

## 2015-02-17 MED ORDER — QVAR 40 MCG/ACT IN AERS: 2.0000 | INHALATION_SPRAY | Freq: Every day | RESPIRATORY_TRACT | Status: DC

## 2015-02-17 NOTE — Telephone Encounter (Signed)
Pt has an appointment for tomorrow afternoon with Ramon Dredge to follow up on wheezing.

## 2015-02-17 NOTE — Telephone Encounter (Signed)
Looks like specialist sent it in previously. I filled as courtesy. But what is going on. Is she short of breath, wheezing ?? Offer her appointment. Document if declines.

## 2015-02-17 NOTE — Telephone Encounter (Signed)
Pt requesting a refill of QVAR 40 mcg, you have not sent in previously.  Please advise if pt would need to be seen by provider before refills can be given.

## 2015-02-17 NOTE — Patient Instructions (Signed)
Will arrange for CPAP mask refit  Follow up in 6 months 

## 2015-02-17 NOTE — Progress Notes (Signed)
Current Outpatient Prescriptions on File Prior to Visit  Medication Sig  . albuterol (PROVENTIL HFA;VENTOLIN HFA) 108 (90 BASE) MCG/ACT inhaler Inhale 2 puffs into the lungs every 6 (six) hours as needed for wheezing or shortness of breath.  [name] aspirin (ASPIR-81) 81 MG EC tablet Take 81 mg by mouth daily.    [name] doxycycline (VIBRA-TABS) 100 MG tablet Take 1 tablet (100 mg total) by mouth 2 (two) times daily.  . furosemide (LASIX) 20 MG tablet Take 1 tablet (20 mg total) by mouth daily as needed for fluid or edema.  . isosorbide mononitrate (IMDUR) 30 MG 24 hr tablet TAKE 1 AND 1/2 TABLETS EVERY MORNING  . levothyroxine (SYNTHROID, LEVOTHROID) 112 MCG tablet Take 1 tablet (112 mcg total) by mouth daily.  [name] losartan (COZAAR) 50 MG tablet Take 1 tablet (50 mg total) by mouth daily.  . metoprolol succinate (TOPROL-XL) 50 MG 24 hr tablet TAKE 1 TABLET (50 MG TOTAL) BY MOUTH 2 (TWO) TIMES DAILY. TAKE WITH OR IMMEDIATELY FOLLOWING A MEAL.  . nitroGLYCERIN (NITROSTAT) 0.4 MG SL tablet Place 1 tablet (0.4 mg total) under the tongue every 5 (five) minutes as needed. At the onset of chest pain, Up to 3 doses  . NON FORMULARY C-Pap machine, uses nightly for sleep apnea  . pravastatin (PRAVACHOL) 20 MG tablet Take 1 tablet (20 mg total) by mouth daily.  . Psyllium (METAFIBER) 48.57 % POWD Take 10 mLs by mouth every evening.   . ranitidine (ZANTAC) 150 MG tablet Take 1 tablet (150 mg total) by mouth daily.   No current facility-administered medications on file prior to visit.     Chief Complaint  Patient presents with  . Follow-up    Wears CPAP nightly. Needs new mask - leaking and stretched. Pt states that mask has leaked from the beginning and is too big (has the smallest mask) - states that she has to hold in while sleeping and some nights does not wear it.      Tests PSG 09/09/14 >> RDI 16.8, SaO2 low 84%, REM AHI 26.7 Auto CPAP 01/17/15 to 02/15/15 >> used on 28 of 30 nights with average 4 hrs 3 min.   Average AHI 6.9 with median CPAP 9 cm H2O and 95 th percentile CPAP 15 cm H2O  Past medical hx CAD, ischemic CM, a/p AICD, HTN, HLD, GERD, HH, Hypothyroidism, Anxiety  Past surgical hx, Allergies, Family hx, Social hx all reviewed.  Vital Signs BP 124/72 mmHg  Pulse 78  Ht  (1.473 m)  Wt 133 lb 3.2 oz (60.419 kg)  BMI 27.85 kg/m2  SpO2 94%  History of Present Illness MADIHA BAMBRICK is a 82 y.o. female with chronic cough from asthma, chronic Rt hemidiaphragm elevation, OSA, and nocturnal hypoxemia.  Her cough has been okay.  She did not notice anything worse after stopping Qvar.  She feels CPAP helps, but her mask is too big.  This interrupts her sleep.  Physical Exam  General - No distress, thin ENT - No sinus tenderness, no oral exudate, no LAN, decreased AP diameter Cardiac - s1s2 regular, no murmur Chest - No wheeze/rales/dullness Back - No focal tenderness Abd - Soft, non-tender Ext - No edema Neuro - Normal strength Skin - No rashes Psych - normal mood, and behavior   Assessment/Plan  Chronic cough from asthma, and reflux. Improved. Plan: - prn albuterol - she is to resume Qvar if she needs albuterol frequently  Obstructive sleep apnea. Plan: - will arrange for  CPAP mask refit - continue auto CPAP   Patient Instructions  Will arrange for CPAP mask refit  Follow up in 6 months     Coralyn Helling, MD  Pulmonary/Critical Care/Sleep Pager:  [phone]

## 2015-02-18 ENCOUNTER — Ambulatory Visit (HOSPITAL_BASED_OUTPATIENT_CLINIC_OR_DEPARTMENT_OTHER)
Admission: RE | Admit: 2015-02-18 | Discharge: 2015-02-18 | Disposition: A | Discharge status: Home / Self Care | Payer: Medicare Other | Source: Ambulatory Visit | Attending: Medical | Admitting: Medical

## 2015-02-18 ENCOUNTER — Ambulatory Visit (INDEPENDENT_AMBULATORY_CARE_PROVIDER_SITE_OTHER): Payer: Medicare Other | Admitting: Medical

## 2015-02-18 ENCOUNTER — Encounter: Payer: Self-pay | Admitting: Medical

## 2015-02-18 VITALS — BP 122/78 | HR 78 | Temp 98.1°F | Ht <= 58 in | Wt 133.6 lb

## 2015-02-18 DIAGNOSIS — R062 Wheezing: Secondary | ICD-10-CM | POA: Insufficient documentation

## 2015-02-18 MED ORDER — BECLOMETHASONE DIPROPIONATE 40 MCG/ACT IN AERS: 2.0000 | INHALATION_SPRAY | Freq: Two times a day (BID) | RESPIRATORY_TRACT | Status: AC

## 2015-02-18 MED ORDER — BECLOMETHASONE DIPROPIONATE 40 MCG/ACT IN AERS: 2.0000 | INHALATION_SPRAY | Freq: Two times a day (BID) | RESPIRATORY_TRACT | Status: DC

## 2015-02-18 MED FILL — QVAR 40 MCG ORAL INHALER: 40 | 30 days supply | Qty: 9 | Fill #0

## 2015-02-18 NOTE — Progress Notes (Signed)
Pre visit review using our clinic review tool, if applicable. No additional management support is needed unless otherwise documented below in the visit note. 

## 2015-02-18 NOTE — Patient Instructions (Addendum)
For your  wheezing, I will add qvar and continue the albuterol.  I do want to get a chest xray today based on your concern for CHF.  Will update you on the xray report and from there.  Follow up in 7 days or as needed

## 2015-02-18 NOTE — Progress Notes (Signed)
Subjective:    Patient ID: [name], female    DOB: [birth year], 82 y.o.   MRN: [id number]  HPI  Pt in with some mild occasional  wheezing for months. But recently got worse last week.   She states that this comes and goes. Pt has been using albuterol a couple of times a day last week.  Pt states she has cpap. But mask does not fit well and causes loud noise She talked with [employer] and they are working on solution to fit.  Pt states no history of smoking. But her husband that passed smoked many years and she was exposed for years.     Review of Systems  HENT: Negative for congestion, sinus pressure and sneezing.   Respiratory: Positive for wheezing. Negative for cough, chest tightness and shortness of breath.   Cardiovascular: Negative for chest pain and palpitations.  Gastrointestinal: Negative for abdominal pain.  Musculoskeletal: Negative for back pain.  Neurological: Negative for dizziness and headaches.  Hematological: Negative for adenopathy. Does not bruise/bleed easily.  Psychiatric/Behavioral: The patient is not nervous/anxious.    Past Medical History  Diagnosis Date  . Chest pain 12/09    recent hospitalization for it. felt to be noncardiac.   [name] CAD (coronary artery disease)     a. s/p prior anterior wall myocardial infarction and multiple percutaneous coronary intervention.;  b. Aden MV 1/14:  low risk, inf and apical defect likely due to atten artifact, no ischemia, EF 35% (visually normal and est 55-60%)  . Ischemic cardiomyopathy     EF 25-30% improved to 45% with biventricular pacing.   . Systolic heart failure     improved to class I to II   . Automatic implantable cardioverter/defibrillator (AICD) activation     s/p implantable cardioverter-defibrillator bivenrticular pacer. 12/05. St, Jude Medtronic. remote-no.  [name] HTN (hypertension)   . Hyperlipidemia   . GERD (gastroesophageal reflux disease)   . Esophageal stricture     last dialated 2007  .  Hiatal hernia   . DD (diverticular disease)     Hx of it - severe. Left colon 2004  . Other urinary problems     urinary incontenance  . UTI (lower urinary tract infection)     recurrent. (Klebsiella pneumoniae). Last Cx 02/03/09. (R-ancef, nitrofurantoin, Augmentin, Zyosyn)  . Arthritis   . PVT (paroxysmal ventricular tachycardia) (HCC)   . CHF (congestive heart failure) (HCC)   . Hypothyroidism   . Anxiety     sleep  . Pacemaker   . Automatic implantable cardioverter-defibrillator in situ   . Heart murmur   . History of recurrent UTIs   . Shortness of breath     with exertion  . Urgency of urination   . Frequent urination at night   . Sleep apnea     has a Cpap machine    Social History   Social History  . Marital Status: Widowed    Spouse Name: N/A  . Number of Children: 4  . Years of Education: N/A   Occupational History  . Retired    Social History Main Topics  . Smoking status: Never Smoker   . Smokeless tobacco: Never Used  . Alcohol Use: No  . Drug Use: No  . Sexual Activity: Not on file   Other Topics Concern  . Not on file   Social History Narrative   Widowed, husband died of brain tumor. Son lives in [place], [place]- was in a MVA for DWI. He  has had a long hx of alcohol problems, she has helped him out financially in the past but is ready to be much stricter. Pt. Has a daughter who live in [place] with Breast Ca. She also lost a son to a brain tumor.     Past Surgical History  Procedure Laterality Date  . Angioplasty      stent  . Unspecified area hysterectomy    . Tubal ligation    . Appendectomy    . Cardiac defibrillator placement      [facility]  . Cataract extraction, bilateral    . Total knee arthroplasty      bilateral  . Tonsillectomy    . Hammertoe reconstruction with weil osteotomy Right 07/17/2012    Procedure: RIGHT SECOND AND THIRD MT WEIL OSTEOTOMIES AND SECOND AND THIRD HAMMERTOE CORRECTIONS;  Surgeon: Toni Arthurs, MD;  Location: [facility] OR;   Service: Orthopedics;  Laterality: Right;  . Colonoscopy w/ polypectomy    . Cardiac catheterization      stent  . Insert / replace / remove pacemaker    . Hammer toe surgery Left 01/15/2013    Procedure: LEFT SECOND THROUGH FOURTH HAMMERTOE CORRECTION ;  Surgeon: Toni Arthurs, MD;  Location: [facility] OR;  Service: Orthopedics;  Laterality: Left;  . Weil osteotomy Left 01/15/2013    Procedure:  WEIL OSTEOTOMY AND DORSAL CAPSULOTOMY;  Surgeon: Toni Arthurs, MD;  Location: [facility] OR;  Service: Orthopedics;  Laterality: Left;    Family History  Problem Relation Age of Onset  . Colon cancer Neg Hx   . Colon polyps Sister   . Heart disease Mother   . Heart disease Father   . Heart disease Brother     multiple brothers  . Heart disease Sister     multiple sisters  . Prostate cancer Brother   . Diabetes Daughter   . Diabetes      aunt  . Breast cancer Daughter   . Alcohol abuse Son   . Brain cancer Son   . Lung cancer Son     Allergies  Allergen Reactions  . Sulfonamide Derivatives     REACTION: rash and itching    Current Outpatient Prescriptions on File Prior to Visit  Medication Sig Dispense Refill  . albuterol (PROVENTIL HFA;VENTOLIN HFA) 108 (90 BASE) MCG/ACT inhaler Inhale 2 puffs into the lungs every 6 (six) hours as needed for wheezing or shortness of breath. 1 Inhaler 2  . aspirin (ASPIR-81) 81 MG EC tablet Take 81 mg by mouth daily.      . furosemide (LASIX) 20 MG tablet Take 1 tablet (20 mg total) by mouth daily as needed for fluid or edema. 90 tablet 1  . isosorbide mononitrate (IMDUR) 30 MG 24 hr tablet TAKE 1 AND 1/2 TABLETS EVERY MORNING 45 tablet 6  . levothyroxine (SYNTHROID, LEVOTHROID) 112 MCG tablet Take 1 tablet (112 mcg total) by mouth daily. 30 tablet 2  . losartan (COZAAR) 50 MG tablet Take 1 tablet (50 mg total) by mouth daily. 30 tablet 5  . metoprolol succinate (TOPROL-XL) 50 MG 24 hr tablet TAKE 1 TABLET (50 MG TOTAL) BY MOUTH 2 (TWO) TIMES DAILY. TAKE WITH OR  IMMEDIATELY FOLLOWING A MEAL. 60 tablet 5  . nitroGLYCERIN (NITROSTAT) 0.4 MG SL tablet Place 1 tablet (0.4 mg total) under the tongue every 5 (five) minutes as needed. At the onset of chest pain, Up to 3 doses 10 tablet 0  . NON FORMULARY C-Pap machine, uses nightly for  sleep apnea    . pravastatin (PRAVACHOL) 20 MG tablet Take 1 tablet (20 mg total) by mouth daily. 90 tablet 1  . Psyllium (METAFIBER) 48.57 % POWD Take 10 mLs by mouth every evening.     . ranitidine (ZANTAC) 150 MG tablet Take 1 tablet (150 mg total) by mouth daily. 30 tablet 3   No current facility-administered medications on file prior to visit.    BP 122/78 mmHg  Pulse 78  Temp(Src) 98.1 F (36.7 C) (Oral)  Ht  (1.473 m)  Wt 133 lb 9.6 oz (60.601 kg)  BMI 27.93 kg/m2  SpO2 98%       Objective:   Physical Exam  General  Mental Status - Alert. General Appearance - Well groomed. Not in acute distress.  Skin Rashes- No Rashes.  HEENT Head- Normal. Ear Auditory Canal - Left- Normal. Right - Normal.Tympanic Membrane- Left- Normal. Right- Normal. Eye Sclera/Conjunctiva- Left- Normal. Right- Normal. Nose & Sinuses Nasal Mucosa- Left-  Not oggy or Congested. Right-  Not  boggy or Congested. Mouth & Throat Lips: Upper Lip- Normal: no dryness, cracking, pallor, cyanosis, or vesicular eruption. Lower Lip-Normal: no dryness, cracking, pallor, cyanosis or vesicular eruption. Buccal Mucosa- Bilateral- No Aphthous ulcers. Oropharynx- No Discharge or Erythema. Tonsils: Characteristics- Bilateral- No Erythema or Congestion. Size/Enlargement- Bilateral- No enlargement. Discharge- bilateral-None.  Neck Neck- Supple. No Masses.   Chest and Lung Exam Auscultation: Breath Sounds:- even and unlabored. Mild shallow respirations.  Cardiovascular Auscultation:Rythm- Regular, rate and rhythm. Murmurs & Other Heart Sounds:Ausculatation of the heart reveal- No Murmurs.  Lymphatic Head & Neck General Head & Neck  Lymphatics: Bilateral: Description- No Localized lymphadenopathy.  Lower ext- no pedal edema.        Assessment & Plan:  For your  wheezing, I will add qvar and continue the albuterol.  I do want to get a chest xray today based on your concern for CHF.  Will update you on the xray report and from there.  Follow up in 7 days or as needed

## 2015-03-01 IMAGING — CR DG TIBIA/FIBULA 2V*R*
4 series · 4 of 4 positions shown · non-contrast
Comparison: None.

CLINICAL DATA: Trauma to lower leg

EXAM:
RIGHT TIBIA AND FIBULA - 2 VIEW

[t tib/fib ap right (1 of 2)]
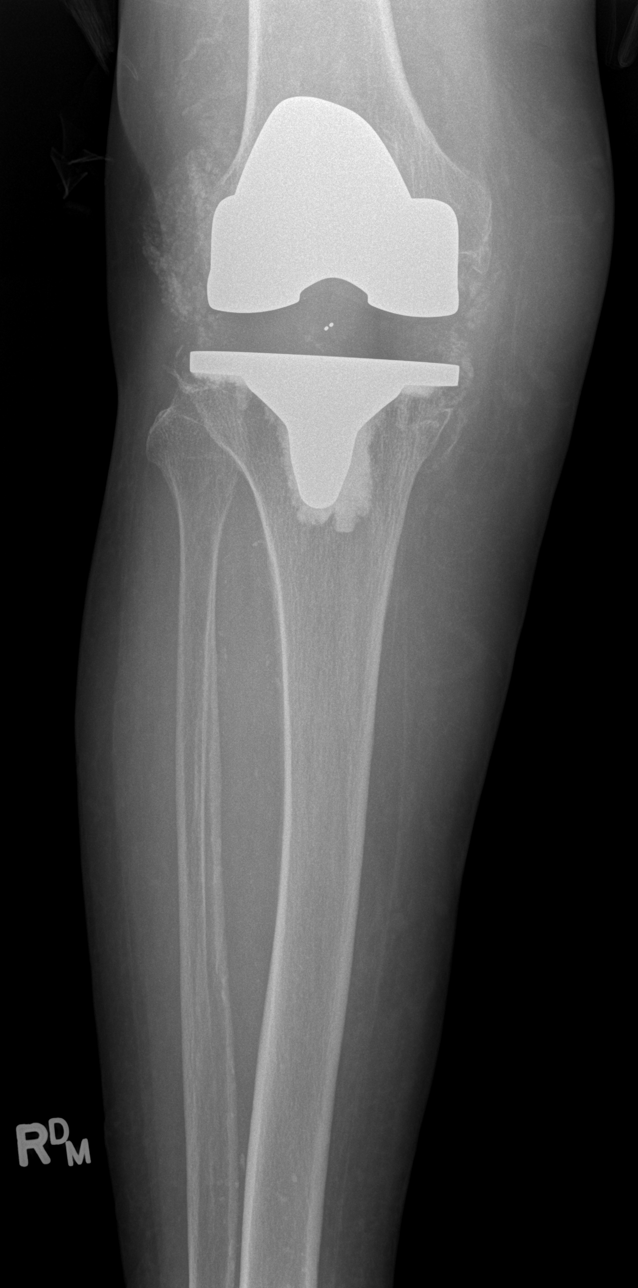

[t tib/fib ap right (2 of 2)]
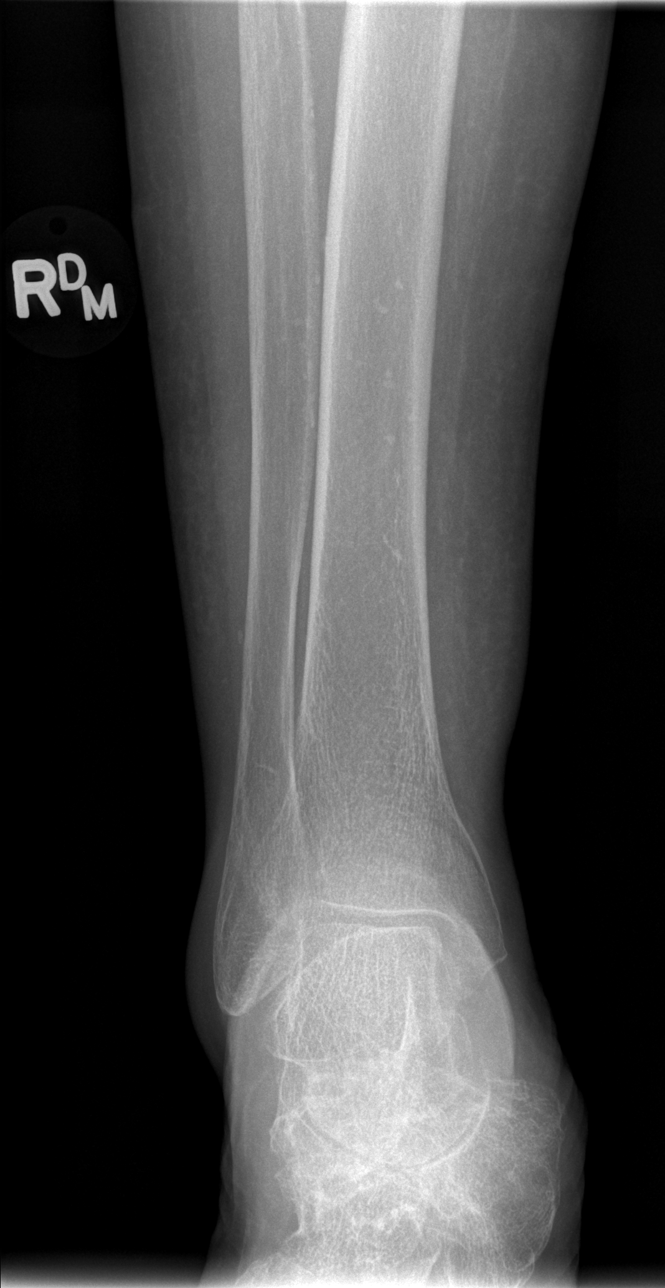

[t tib/fib lat right (1 of 2)]
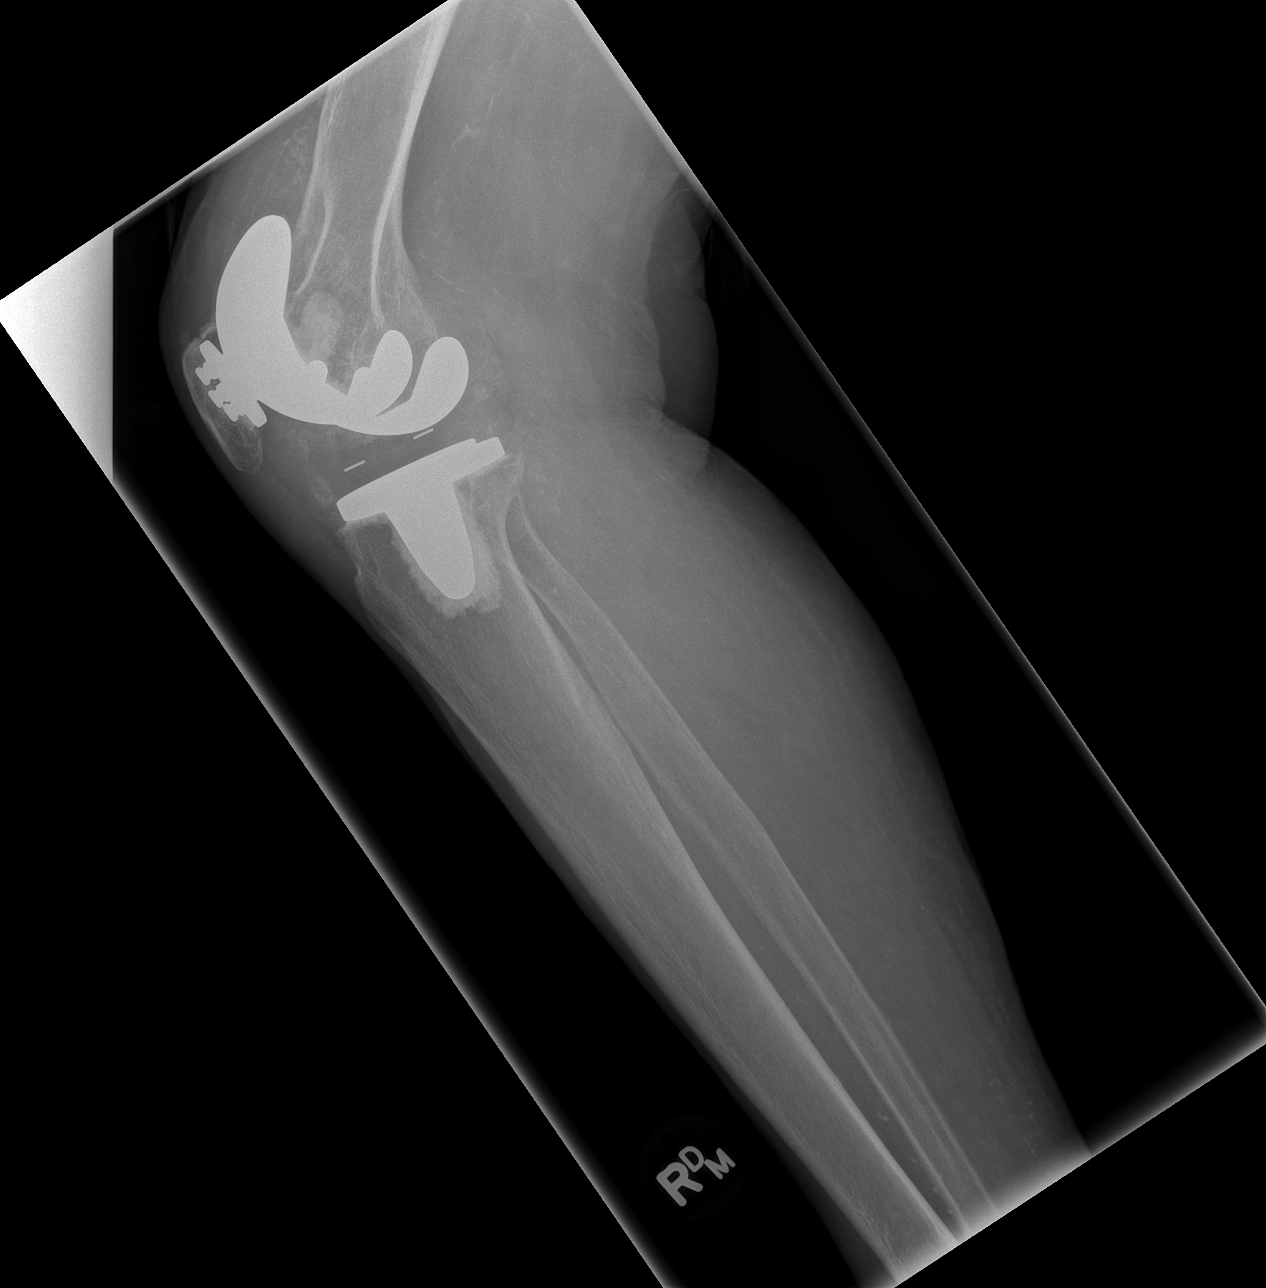

[t tib/fib lat right (2 of 2)]
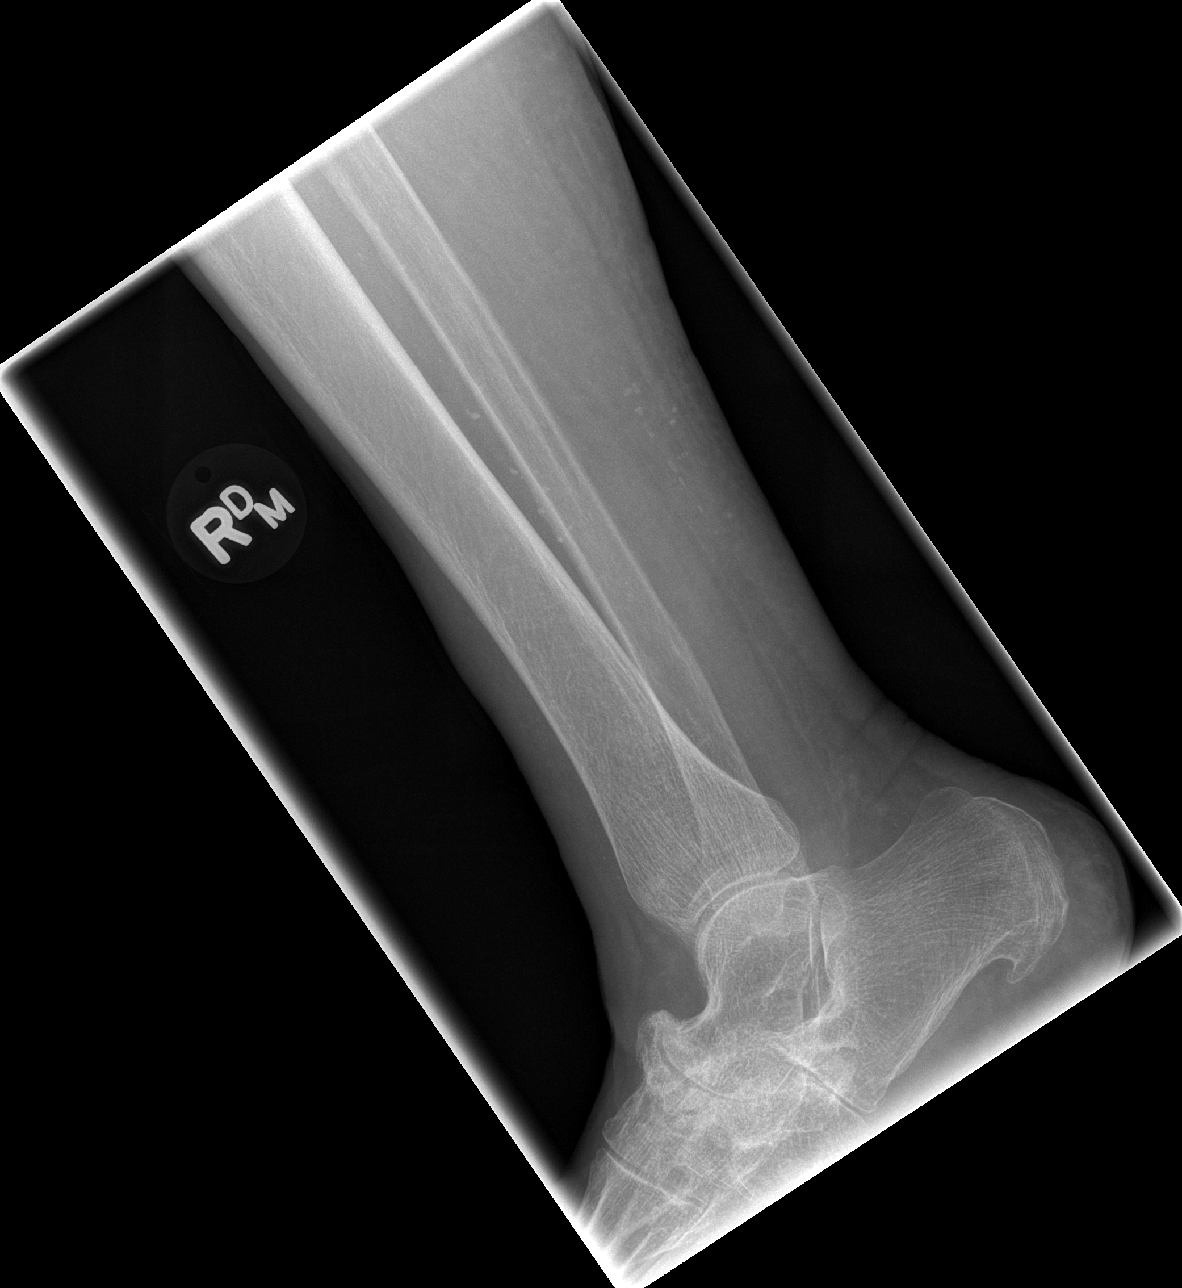

[4 of 4 positions shown; findings below may reference images not displayed]

FINDINGS: The patient is status post right total knee arthroplasty. The bones
appear osteopenic. No fracture.
IMPRESSION: 1. No acute findings.
2. Osteopenia.

## 2015-03-04 ENCOUNTER — Telehealth: Payer: Self-pay | Admitting: Pulmonary Disease

## 2015-03-04 NOTE — Telephone Encounter (Signed)
Called and spoke with Selena Batten at [facility]. She states that the patient is not eligible for a new mask until March 28th, 2017 due Medicare. I explained that I would contact the pt to inform her of the issue.  Called and spoke with the pt. Informed her of the above. She voiced understanding and had no further questions. Nothing further needed.

## 2015-03-07 ENCOUNTER — Other Ambulatory Visit: Payer: Self-pay | Admitting: Family Medicine

## 2015-03-26 ENCOUNTER — Other Ambulatory Visit: Payer: Self-pay | Admitting: Family Medicine

## 2015-04-05 ENCOUNTER — Other Ambulatory Visit: Payer: Self-pay | Admitting: Family Medicine

## 2015-04-05 ENCOUNTER — Telehealth: Payer: Self-pay | Admitting: Family Medicine

## 2015-04-05 NOTE — Telephone Encounter (Signed)
Detailed message left on the voicemail advising to use 25 mg of Benadryl and to call back and let us know if the patient has a rash.    KP

## 2015-04-05 NOTE — Telephone Encounter (Signed)
Ok to give benadryl but stick with 25 mg  Does she has a rash?

## 2015-04-05 NOTE — Telephone Encounter (Signed)
Please advise      KP 

## 2015-04-05 NOTE — Telephone Encounter (Signed)
Caller name: Lupita LeashDonna Relationship to patient: daughter Can be reached: [phone]   Reason for call: pt has been itching and ripping her skin up. They want to know if it is safe to give her benadryl and what dose. Please call Lupita LeashDonna.

## 2015-04-06 NOTE — Telephone Encounter (Signed)
[name] called back in to follow up on the concern. Informed of the message below. She expressed understanding. She will give mom Benadryl 25 mg. ALSO, she says no mom doesn't have a rash, she's just itching really bad. She think that it's moms nerves due to a lot of family changes.

## 2015-05-02 ENCOUNTER — Telehealth: Payer: Self-pay | Admitting: Pulmonary Disease

## 2015-05-02 NOTE — Telephone Encounter (Signed)
Spoke with pt's granddaughter, Lupita LeashDonna. They would like to know if it would be okay for pt to skip one night of her CPAP. Pt is traveling to [place] and will be flying. Lupita LeashDonna will be bringing her CPAP the following evening when she travels by car. It would be to much trouble for the pt to take her CPAP with her on the plane. Advised Lupita LeashDonna that one night would be fine but it should not be any longer than that. She agreed and verbalized understanding.

## 2015-05-05 ENCOUNTER — Other Ambulatory Visit: Payer: Self-pay | Admitting: Family Medicine

## 2015-05-26 ENCOUNTER — Telehealth: Payer: Self-pay | Admitting: Family Medicine

## 2015-06-05 ENCOUNTER — Other Ambulatory Visit: Payer: Self-pay | Admitting: Family Medicine

## 2015-06-07 ENCOUNTER — Other Ambulatory Visit: Payer: Self-pay | Admitting: Family Medicine

## 2015-06-13 ENCOUNTER — Other Ambulatory Visit: Payer: Self-pay | Admitting: Family Medicine

## 2015-06-13 DIAGNOSIS — E039 Hypothyroidism, unspecified: Secondary | ICD-10-CM

## 2015-06-13 NOTE — Telephone Encounter (Signed)
Please call and schedule this patient a repeat lab for her thyroid.   KP

## 2015-06-14 MED ORDER — LEVOTHYROXINE SODIUM 112 MCG PO TABS: 112.0000 ug | ORAL_TABLET | Freq: Every day | ORAL | Status: DC

## 2015-06-14 NOTE — Telephone Encounter (Signed)
The medication has been faxed.    KP

## 2015-06-14 NOTE — Telephone Encounter (Signed)
Called Lupita LeashDonna, informed of message below. She says okay.

## 2015-06-14 NOTE — Telephone Encounter (Signed)
Patient scheduled for 06/16/2015 ° °

## 2015-06-14 NOTE — Telephone Encounter (Signed)
Pt's daughter Lupita LeashDonna called in because she says that pt need her thyroid medication. Informed that an lab appt is needed. Scheduled pt lab appt for Thursday. Lupita LeashDonna would like to know if PCP could provide pt with a few pills or a Rx until her appt?    Phone: [phone]

## 2015-06-15 ENCOUNTER — Other Ambulatory Visit: Payer: Self-pay | Admitting: Family Medicine

## 2015-06-16 ENCOUNTER — Other Ambulatory Visit: Payer: Medicare Other

## 2015-06-17 ENCOUNTER — Other Ambulatory Visit (INDEPENDENT_AMBULATORY_CARE_PROVIDER_SITE_OTHER): Payer: Medicare Other

## 2015-06-17 DIAGNOSIS — E039 Hypothyroidism, unspecified: Secondary | ICD-10-CM | POA: Diagnosis not present

## 2015-06-17 LAB — TSH: TSH: 3.48 u[IU]/mL (ref 0.35–4.50)

## 2015-06-29 ENCOUNTER — Telehealth: Payer: Self-pay | Admitting: *Deleted

## 2015-06-29 ENCOUNTER — Other Ambulatory Visit: Payer: Self-pay | Admitting: *Deleted

## 2015-06-29 ENCOUNTER — Ambulatory Visit (INDEPENDENT_AMBULATORY_CARE_PROVIDER_SITE_OTHER): Payer: Medicare Other | Admitting: Internal Medicine

## 2015-06-29 ENCOUNTER — Encounter: Payer: Self-pay | Admitting: Internal Medicine

## 2015-06-29 ENCOUNTER — Telehealth: Payer: Self-pay | Admitting: Family Medicine

## 2015-06-29 VITALS — BP 116/64 | HR 78 | Ht <= 58 in | Wt 131.0 lb

## 2015-06-29 DIAGNOSIS — Z95810 Presence of automatic (implantable) cardiac defibrillator: Secondary | ICD-10-CM | POA: Diagnosis not present

## 2015-06-29 DIAGNOSIS — I5022 Chronic systolic (congestive) heart failure: Secondary | ICD-10-CM

## 2015-06-29 DIAGNOSIS — I471 Supraventricular tachycardia: Secondary | ICD-10-CM

## 2015-06-29 DIAGNOSIS — I255 Ischemic cardiomyopathy: Secondary | ICD-10-CM | POA: Diagnosis not present

## 2015-06-29 MED ORDER — FUROSEMIDE 20 MG PO TABS: 20.0000 mg | ORAL_TABLET | Freq: Every day | ORAL | Status: DC | PRN

## 2015-06-29 NOTE — Telephone Encounter (Signed)
So sorry- she didn't say she needed it called in. Ok to refill for furosemide as currently prescribed in [facility].

## 2015-06-29 NOTE — Telephone Encounter (Signed)
Caller name: Lupita LeashDonna Relationship to patient: Granddaughter Can be reached: [phone]    Reason for call: Granddaughter called request refill on Lasix. States patient saw her Cardiologist today and was advised to take it but was not given a rx. Granddaughter is Careers information officercalling Cardiologist office back to see if they can send rx today because Dr. Zola ButtonLowne-Chase is not in.

## 2015-06-29 NOTE — Patient Instructions (Signed)
Medication Instructions: - Your physician has recommended you make the following change in your medication:  1) Take lasix (furosemide) 20 mg once daily x 5 days then resume as needed  Labwork: - none  Procedures/Testing: - none  Follow-Up: - Remote monitoring is used to monitor your Pacemaker of ICD from home. This monitoring reduces the number of office visits required to check your device to one time per year. It allows us to keep an eye on the functioning of your device to ensure it is working properly. You are scheduled for a device check from home on 08/01/15. You may send your transmission at any time that day. If you have a wireless device, the transmission will be sent automatically. After your physician reviews your transmission, you will receive a postcard with your next transmission date.  - Your physician wants you to follow-up in: 1 year with Dr. Graciela HusbandsKlein. You will receive a reminder letter in the mail two months in advance. If you don't receive a letter, please call our office to schedule the follow-up appointment.  Any Additional Special Instructions Will Be Listed Below (If Applicable).     If you need a refill on your cardiac medications before your next appointment, please call your pharmacy.

## 2015-06-29 NOTE — Telephone Encounter (Signed)
Patients grandaughter called and stated that the patient was seen today and was instructed to resume the furosemide but an rx was not sent in. She stated that this was previously refilled by patients pcp, but when she called their office she was told to call us. Ok to refill? Please advise. Thanks, MI

## 2015-06-29 NOTE — Progress Notes (Signed)
Patient Care Team: Donato Schultz, DO as PCP - General (Family Medicine) Vladimir Faster, DO as Consulting Physician (Neurology) Hart Carwin, MD as Consulting Physician (Gastroenterology) Duke Salvia, MD as Consulting Physician (Cardiology) Lenda Kelp, MD as Consulting Physician (Sports Medicine)   HPI  [name] is a 82 y.o. female Seen in followup for coronary artery disease with multiple prior PCI and congestive heart failure. She has an ischemic cardiomyopathy and is status post CRT-D implantation with interval improvement. Generator replacement in November 2011    She has an ICD discharge; however, there were no symptoms.   She has a RIATA 1500 lead in place  Last [facility] 10/05: RCA stent site patent, PL branch of the RCA 50%, dCFX 80% treated with Taxus DES  Nuclear study a 12/10: No scar or ischemia, EF 54%.  Repeat nuclear study 1/14 because of chest pain demonstrated no significant ischemia. Ejection fraction is measured at 35% although visualization thought to be about 55-60% with normal wall motion    Recently she has had problems with shortness of breath and some edema   Past Medical History  Diagnosis Date  . Chest pain 12/09    recent hospitalization for it. felt to be noncardiac.   [name] CAD (coronary artery disease)     a. s/p prior anterior wall myocardial infarction and multiple percutaneous coronary intervention.;  b. Aden MV 1/14:  low risk, inf and apical defect likely due to atten artifact, no ischemia, EF 35% (visually normal and est 55-60%)  . Ischemic cardiomyopathy     EF 25-30% improved to 45% with biventricular pacing.   . Systolic heart failure     improved to class I to II   . Automatic implantable cardioverter/defibrillator (AICD) activation     s/p implantable cardioverter-defibrillator bivenrticular pacer. 12/05. St, Jude Medtronic. remote-no.  [name] HTN (hypertension)   . Hyperlipidemia   . GERD (gastroesophageal reflux disease)     . Esophageal stricture     last dialated 2007  . Hiatal hernia   . DD (diverticular disease)     Hx of it - severe. Left colon 2004  . Other urinary problems     urinary incontenance  . UTI (lower urinary tract infection)     recurrent. (Klebsiella pneumoniae). Last Cx 02/03/09. (R-ancef, nitrofurantoin, Augmentin, Zyosyn)  . Arthritis   . PVT (paroxysmal ventricular tachycardia) (HCC)   . CHF (congestive heart failure) (HCC)   . Hypothyroidism   . Anxiety     sleep  . Pacemaker   . Automatic implantable cardioverter-defibrillator in situ   . Heart murmur   . History of recurrent UTIs   . Shortness of breath     with exertion  . Urgency of urination   . Frequent urination at night   . Sleep apnea     has a Cpap machine    Past Surgical History  Procedure Laterality Date  . Angioplasty      stent  . Unspecified area hysterectomy    . Tubal ligation    . Appendectomy    . Cardiac defibrillator placement      [facility]  . Cataract extraction, bilateral    . Total knee arthroplasty      bilateral  . Tonsillectomy    . Hammertoe reconstruction with weil osteotomy Right 07/17/2012    Procedure: RIGHT SECOND AND THIRD MT WEIL OSTEOTOMIES AND SECOND AND THIRD HAMMERTOE CORRECTIONS;  Surgeon: Jonny Ruiz  Victorino Dike, MD;  Location: [facility] OR;  Service: Orthopedics;  Laterality: Right;  . Colonoscopy w/ polypectomy    . Cardiac catheterization      stent  . Insert / replace / remove pacemaker    . Hammer toe surgery Left 01/15/2013    Procedure: LEFT SECOND THROUGH FOURTH HAMMERTOE CORRECTION ;  Surgeon: Toni Arthurs, MD;  Location: [facility] OR;  Service: Orthopedics;  Laterality: Left;  . Weil osteotomy Left 01/15/2013    Procedure:  WEIL OSTEOTOMY AND DORSAL CAPSULOTOMY;  Surgeon: Toni Arthurs, MD;  Location: [facility] OR;  Service: Orthopedics;  Laterality: Left;    Current Outpatient Prescriptions  Medication Sig Dispense Refill  . albuterol (PROVENTIL HFA;VENTOLIN HFA) 108 (90 BASE) MCG/ACT inhaler  Inhale 2 puffs into the lungs every 6 (six) hours as needed for wheezing or shortness of breath. 1 Inhaler 2  . aspirin (ASPIR-81) 81 MG EC tablet Take 81 mg by mouth daily.      . beclomethasone (QVAR) 40 MCG/ACT inhaler Inhale 2 puffs into the lungs 2 (two) times daily. 1 Inhaler 3  . furosemide (LASIX) 20 MG tablet Take 1 tablet (20 mg total) by mouth daily as needed for fluid or edema. 90 tablet 1  . isosorbide mononitrate (IMDUR) 30 MG 24 hr tablet TAKE ONE & ONE-HALF TABLETS BY MOUTH IN THE MORNING 45 tablet 5  . levothyroxine (SYNTHROID, LEVOTHROID) 112 MCG tablet Take 1 tablet (112 mcg total) by mouth daily. Repeat labs are due now 30 tablet 0  . losartan (COZAAR) 50 MG tablet TAKE ONE TABLET BY MOUTH ONCE DAILY 30 tablet 5  . metoprolol succinate (TOPROL-XL) 50 MG 24 hr tablet TAKE ONE TABLET BY MOUTH TWICE DAILY WITH OR IMMEDIATELY FOLLOWING MEAL 60 tablet 2  . nitroGLYCERIN (NITROSTAT) 0.4 MG SL tablet Place 1 tablet (0.4 mg total) under the tongue every 5 (five) minutes as needed. At the onset of chest pain, Up to 3 doses 10 tablet 0  . NON FORMULARY C-Pap machine, uses nightly for sleep apnea    . pravastatin (PRAVACHOL) 20 MG tablet TAKE ONE TABLET BY MOUTH ONCE DAILY 90 tablet 0  . Psyllium (METAFIBER) 48.57 % POWD Take 10 mLs by mouth every evening.      No current facility-administered medications for this visit.    Allergies  Allergen Reactions  . Sulfonamide Derivatives     REACTION: rash and itching    Review of Systems negative except from HPI and PMH  Physical Exam BP 116/64 mmHg  Pulse 78  Ht  (1.473 m)  Wt 131 lb (59.421 kg)  BMI 27.39 kg/m2  SpO2 97% Well developed and well nourished in no acute distress HENT normal E scleral and icterus clear Neck Supple Marked kyphosis Clear to ausculation status post Regular rate and rhythm, no murmurs gallops or rub Soft with active bowel sounds No clubbing cyanosis trace Edema Alert and oriented, grossly  normal motor and sensory function Skin Warm and Dry  ECG demonstrates P. synchronous biventricular pacing    Assessment and  Plan  Atrial tachycardia  Ischemic cardiomyopathy  Hypertension  Implantable defibrillator-St. Jude  Heart failure systolic acute on chronic   Her device is approaching ERI. We have broached the subject as to whether to replace her CRT-D with CRT-P versus CRT-D. We discussed mode of death. She had her daughter will think about this between now and ERI  She has some problems with dyspnea and edeam We will change her diruetic to take x 5  days and see if can improve matters

## 2015-06-30 NOTE — Telephone Encounter (Signed)
Sent by the Cardiologist ON 06/29/15.   KP

## 2015-07-08 ENCOUNTER — Telehealth: Payer: Self-pay | Admitting: Internal Medicine

## 2015-07-08 ENCOUNTER — Telehealth: Payer: Self-pay | Admitting: Cardiology

## 2015-07-08 NOTE — Telephone Encounter (Signed)
New message       1. Has your device fired? no 2. Is you device beeping? no  3. Are you experiencing draining or swelling at device site? no  4. Are you calling to see if we received your device transmission? Making sure the signal; is downloading  5. Have you passed out? no

## 2015-07-08 NOTE — Telephone Encounter (Signed)
Spoke w/ pt daughter. Requested that pt send a manual transmission w/ home monitor b/c home monitor has not updated in at least 8 days. Pt daughter verbalized understanding.

## 2015-07-08 NOTE — Telephone Encounter (Signed)
Called and let [name] know that pts home monitor updated this morning.

## 2015-07-11 ENCOUNTER — Other Ambulatory Visit: Payer: Self-pay | Admitting: Family Medicine

## 2015-07-21 ENCOUNTER — Ambulatory Visit (INDEPENDENT_AMBULATORY_CARE_PROVIDER_SITE_OTHER): Payer: Medicare Other | Admitting: Medical

## 2015-07-21 ENCOUNTER — Encounter: Payer: Self-pay | Admitting: Medical

## 2015-07-21 VITALS — BP 124/59 | HR 72 | Temp 98.1°F | Ht <= 58 in | Wt 125.4 lb

## 2015-07-21 DIAGNOSIS — B351 Tinea unguium: Secondary | ICD-10-CM

## 2015-07-21 DIAGNOSIS — G5603 Carpal tunnel syndrome, bilateral upper limbs: Secondary | ICD-10-CM | POA: Diagnosis not present

## 2015-07-21 DIAGNOSIS — I255 Ischemic cardiomyopathy: Secondary | ICD-10-CM | POA: Diagnosis not present
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
## 2015-07-21 NOTE — Patient Instructions (Addendum)
You do appear to likely have nail fungus of your rt thumb.(by your description). Oral medication risk at your age exceeds benefit. When you take polish off can use otc lotrimin or lamisil. Not ideal for nails but is an option.  You may have Carpel tunnel syndrome. For occasional relief would advise using low dose ibuprofen 200 mg every 8 hours if needed to get relief. Sporadic use of med and not daily.  Follow up as regularly scheduled with pcp or as needed with myself.

## 2015-07-21 NOTE — Progress Notes (Signed)
Pre visit review using our clinic review tool, if applicable. No additional management support is needed unless otherwise documented below in the visit note. 

## 2015-07-29 ENCOUNTER — Telehealth: Payer: Self-pay | Admitting: Internal Medicine

## 2015-07-29 NOTE — Telephone Encounter (Signed)
Reviewed chart, routing to device clinic triage.

## 2015-07-29 NOTE — Telephone Encounter (Signed)
New Message  Pt grand dtr calling to speak w/ RN- concerning shced procedure to have ICD battery changeout. Please call back and discuss.

## 2015-07-29 NOTE — Telephone Encounter (Signed)
Returned patient's granddaughter's call.  She states the patient is planning to go on vacation this fall and wants to ensure this will not conflict with her ICD battery changeout.  Discussed ERI and usage with patient's granddaughter.  Advised that patient's device had 4.9 months remaining as of 06/29/15, but that we will have a better idea as of Monday, 08/01/15, when she is scheduled for another device check from home (for battery).  Patient's granddaughter will plan to call the device clinic on Monday to find out updated longevity estimate.  Gave device clinic phone number.  Patient's granddaughter is appreciative of call and denies additional questions or concerns at this time.

## 2015-08-01 ENCOUNTER — Ambulatory Visit (INDEPENDENT_AMBULATORY_CARE_PROVIDER_SITE_OTHER): Payer: Medicare Other | Admitting: *Deleted

## 2015-08-01 DIAGNOSIS — I255 Ischemic cardiomyopathy: Secondary | ICD-10-CM | POA: Diagnosis not present

## 2015-08-01 DIAGNOSIS — Z95810 Presence of automatic (implantable) cardiac defibrillator: Secondary | ICD-10-CM

## 2015-08-01 NOTE — Progress Notes (Signed)
Remote ICD transmission.   

## 2015-08-03 ENCOUNTER — Encounter: Payer: Self-pay | Admitting: Cardiology

## 2015-08-03 LAB — CUP PACEART REMOTE DEVICE CHECK
Battery Remaining Percentage: 6 %
Battery Voltage: 2.63 V
Brady Statistic AP VP Percent: 1 %
Brady Statistic AS VP Percent: 93 %
HIGH POWER IMPEDANCE MEASURED VALUE: 44 Ohm
Implantable Lead Implant Date: 20051206
Implantable Lead Implant Date: 20051206
Implantable Lead Location: 753859
Implantable Lead Location: 753860
Lead Channel Impedance Value: 360 Ohm
Lead Channel Pacing Threshold Amplitude: 1.5 V
Lead Channel Pacing Threshold Amplitude: 1.625 V
Lead Channel Sensing Intrinsic Amplitude: 8.2 mV
Lead Channel Setting Pacing Pulse Width: 0.7 ms
Lead Channel Setting Sensing Sensitivity: 0.5 mV
MDC IDC LEAD IMPLANT DT: 20051206
MDC IDC LEAD LOCATION: 753860
MDC IDC MSMT BATTERY REMAINING LONGEVITY: 5 mo
MDC IDC MSMT LEADCHNL LV PACING THRESHOLD PULSEWIDTH: 0.8 ms
MDC IDC MSMT LEADCHNL RA IMPEDANCE VALUE: 410 Ohm
MDC IDC MSMT LEADCHNL RA PACING THRESHOLD AMPLITUDE: 0.75 V
MDC IDC MSMT LEADCHNL RA PACING THRESHOLD PULSEWIDTH: 0.5 ms
MDC IDC MSMT LEADCHNL RA SENSING INTR AMPL: 1.5 mV
MDC IDC MSMT LEADCHNL RV IMPEDANCE VALUE: 650 Ohm
MDC IDC MSMT LEADCHNL RV PACING THRESHOLD PULSEWIDTH: 0.7 ms
MDC IDC SESS DTM: 20170724080210
MDC IDC SET LEADCHNL LV PACING AMPLITUDE: 2.5 V
MDC IDC SET LEADCHNL LV PACING PULSEWIDTH: 0.8 ms
MDC IDC SET LEADCHNL RA PACING AMPLITUDE: 2 V
MDC IDC SET LEADCHNL RV PACING AMPLITUDE: 2.625
MDC IDC STAT BRADY AP VS PERCENT: 1 %
MDC IDC STAT BRADY AS VS PERCENT: 3.6 %
MDC IDC STAT BRADY RA PERCENT PACED: 1 %
Pulse Gen Serial Number: 815822

## 2015-08-11 ENCOUNTER — Ambulatory Visit: Payer: Medicare Other

## 2015-08-17 ENCOUNTER — Telehealth: Payer: Self-pay | Admitting: Family Medicine

## 2015-08-17 NOTE — Telephone Encounter (Signed)
Relation to ZO:XWRUpt:self Call back number:[phone]   Reason for call:  Patient scheduled her BMP for 08/18/15 and prolia for 08/25/15 with the nurse.

## 2015-08-18 ENCOUNTER — Other Ambulatory Visit (INDEPENDENT_AMBULATORY_CARE_PROVIDER_SITE_OTHER): Payer: Medicare Other

## 2015-08-18 ENCOUNTER — Ambulatory Visit (INDEPENDENT_AMBULATORY_CARE_PROVIDER_SITE_OTHER): Payer: Medicare Other | Admitting: Pulmonary Disease

## 2015-08-18 ENCOUNTER — Encounter: Payer: Self-pay | Admitting: Pulmonary Disease

## 2015-08-18 VITALS — BP 124/72 | HR 77 | Ht <= 58 in | Wt 130.2 lb

## 2015-08-18 DIAGNOSIS — Z9989 Dependence on other enabling machines and devices: Principal | ICD-10-CM

## 2015-08-18 DIAGNOSIS — I255 Ischemic cardiomyopathy: Secondary | ICD-10-CM

## 2015-08-18 DIAGNOSIS — R05 Cough: Secondary | ICD-10-CM

## 2015-08-18 DIAGNOSIS — R053 Chronic cough: Secondary | ICD-10-CM

## 2015-08-18 DIAGNOSIS — J4530 Mild persistent asthma, uncomplicated: Secondary | ICD-10-CM

## 2015-08-18 DIAGNOSIS — M810 Age-related osteoporosis without current pathological fracture: Secondary | ICD-10-CM

## 2015-08-18 DIAGNOSIS — G4733 Obstructive sleep apnea (adult) (pediatric): Secondary | ICD-10-CM

## 2015-08-18 LAB — BASIC METABOLIC PANEL
BUN: 19 mg/dL (ref 6–23)
CALCIUM: 10.2 mg/dL (ref 8.4–10.5)
CO2: 31 meq/L (ref 19–32)
Chloride: 104 mEq/L (ref 96–112)
Creatinine, Ser: 0.72 mg/dL (ref 0.40–1.20)
GFR: 80.47 mL/min (ref >60.00)
GLUCOSE: 109 mg/dL — AB (ref 70–99)
Potassium: 4.9 mEq/L (ref 3.5–5.1)
Sodium: 138 mEq/L (ref 135–145)

## 2015-08-18 MED ORDER — ALBUTEROL SULFATE HFA 108 (90 BASE) MCG/ACT IN AERS: 2.0000 | INHALATION_SPRAY | Freq: Four times a day (QID) | RESPIRATORY_TRACT | 2 refills | Status: AC | PRN

## 2015-08-18 NOTE — Progress Notes (Signed)
Current Outpatient Prescriptions on File Prior to Visit  Medication Sig  . aspirin (ASPIR-81) 81 MG EC tablet Take 81 mg by mouth daily.    . beclomethasone (QVAR) 40 MCG/ACT inhaler Inhale 2 puffs into the lungs 2 (two) times daily.  . furosemide (LASIX) 20 MG tablet Take 1 tablet (20 mg total) by mouth daily as needed for fluid or edema.  . isosorbide mononitrate (IMDUR) 30 MG 24 hr tablet TAKE ONE & ONE-HALF TABLETS BY MOUTH IN THE MORNING  . levothyroxine (SYNTHROID, LEVOTHROID) 112 MCG tablet Take 1 tablet (112 mcg total) by mouth daily before breakfast.  . losartan (COZAAR) 50 MG tablet TAKE ONE TABLET BY MOUTH ONCE DAILY  . metoprolol succinate (TOPROL-XL) 50 MG 24 hr tablet TAKE ONE TABLET BY MOUTH TWICE DAILY WITH OR IMMEDIATELY FOLLOWING MEAL  . nitroGLYCERIN (NITROSTAT) 0.4 MG SL tablet Place 1 tablet (0.4 mg total) under the tongue every 5 (five) minutes as needed. At the onset of chest pain, Up to 3 doses  . NON FORMULARY C-Pap machine, uses nightly for sleep apnea  . pravastatin (PRAVACHOL) 20 MG tablet TAKE ONE TABLET BY MOUTH ONCE DAILY  . Psyllium (METAFIBER) 48.57 % POWD Take 10 mLs by mouth every evening.    No current facility-administered medications on file prior to visit.      Chief Complaint  Patient presents with  . Follow-up    Wears CPAP nightly.  Pt needing a mask fitting, current mask is still too big and she has to hold it on her face at night. Pt states that she wakes up nightly. Pt states that her mask is not sealing like its supposed to. DME: [facility]     Tests PSG 09/09/14 >> RDI 16.8, SaO2 low 84%, REM AHI 26.7 Auto CPAP 05/19/15 to 08/16/15 >> used on 89 of 90 nights with average 4 hrs 34 min.  Average AHI 4.5 with median CPAP 11 and 95 th percentile CPAP 15 cm H2O  Past medical hx CAD, ischemic CM, a/p AICD, HTN, HLD, GERD, HH, Hypothyroidism, Anxiety  Past surgical hx, Allergies, Family hx, Social hx all reviewed.  Vital Signs BP 124/72 (BP Location:  Left Arm, Cuff Size: Normal)   Pulse 77   Ht 4\' 10"  (1.473 m)   Wt 130 lb 3.2 oz (59.1 kg)   SpO2 98%   BMI 27.21 kg/m   History of Present Illness [name] is a 82 y.o. female with chronic cough from asthma, chronic Rt hemidiaphragm elevation, OSA, and nocturnal hypoxemia.  She has noticed more cough and wheeze.  She has been using Qvar intermittently.  She hasn't been using albuterol.  Denies fever or chest pain.  She uses CPAP nightly.  This helps.  Her mask is too big, and this makes it difficult for her to stay asleep.  She was told by [facility] that they won't provide her with supplies >> not clear why.  Physical Exam  General - No distress, thin ENT - No sinus tenderness, no oral exudate, no LAN, decreased AP diameter Cardiac - s1s2 regular, no murmur Chest - No wheeze/rales/dullness Back - No focal tenderness Abd - Soft, non-tender Ext - No edema Neuro - Normal strength Skin - No rashes Psych - normal mood, and behavior   Assessment/Plan  Chronic cough from asthma, and reflux. - will have her try using albuterol prn again - if symptoms persist then she is to restart Qvar - discussed roles of maintenance versus prn inhalers  Obstructive  sleep apnea. - she is compliant and reports benefit - main issue is mask fit >> will arrange for CPAP mask refit - continue auto CPAP - not sure why her DME no longer will provide supplies for her CPAP >> will arrange for new DME   Patient Instructions  Albuterol two puffs every 6 hours as needed for cough, wheeze, or chest congestion  Will arrange for new home care company for CPAP supplies, and arrange for CPAP mask refit  Follow up in 6 months    Coralyn Helling, MD [facility] Pager:  [phone] 08/18/2015, 12:39 PM

## 2015-08-18 NOTE — Patient Instructions (Signed)
Albuterol two puffs every 6 hours as needed for cough, wheeze, or chest congestion  Will arrange for new home care company for CPAP supplies, and arrange for CPAP mask refit  Follow up in 6 months

## 2015-08-22 ENCOUNTER — Telehealth: Payer: Self-pay | Admitting: Pulmonary Disease

## 2015-08-22 ENCOUNTER — Other Ambulatory Visit: Payer: Self-pay | Admitting: Family Medicine

## 2015-08-22 DIAGNOSIS — G4733 Obstructive sleep apnea (adult) (pediatric): Secondary | ICD-10-CM

## 2015-08-22 NOTE — Telephone Encounter (Signed)
[facility] She states that the patient was non compliant with her CPAP from a previous DME and her AHI was at 3.6, which needs to be 4 or above. Fleet ContrasRachel states that she will need a new sleep study in order to move forward with the CPAP order. I explained to her that I would send a message to VS to make him aware and for his approval. She voiced understanding and had no further questions.   VS please advise

## 2015-08-22 NOTE — Telephone Encounter (Signed)
Prolia benefits subject to a $183 deductible (met) and 20% co-insurance for the administration and cost of Prolia. Secondary plans follows Medicare guidelines. Provider is in network for this patient's plan. This plan will consider the Medicare Part B co-insurance up to primary plans allowable but does not cover the Medicare Part B deductible. Claims will crossover for coordination. No referral required.    KP

## 2015-08-23 NOTE — Telephone Encounter (Signed)
Spoke with pt's granddaughter, [name]. She is aware that pt will need to repeat her sleep study. Order has been placed for home sleep study per VS. Nothing further was needed.

## 2015-08-23 NOTE — Telephone Encounter (Signed)
Please explain this issue to pt >> her insurance is requiring repeat sleep study.    Please arrange for home sleep study.

## 2015-08-23 NOTE — Telephone Encounter (Signed)
lmtcb x1 for pt's granddaughter, Lupita LeashDonna.

## 2015-08-23 NOTE — Telephone Encounter (Signed)
[phone] donna cb

## 2015-08-24 DIAGNOSIS — H40013 Open angle with borderline findings, low risk, bilateral: Secondary | ICD-10-CM | POA: Diagnosis not present

## 2015-08-24 DIAGNOSIS — H04123 Dry eye syndrome of bilateral lacrimal glands: Secondary | ICD-10-CM | POA: Diagnosis not present

## 2015-08-25 ENCOUNTER — Ambulatory Visit (INDEPENDENT_AMBULATORY_CARE_PROVIDER_SITE_OTHER): Payer: Medicare Other | Admitting: Behavioral Health

## 2015-08-25 DIAGNOSIS — M810 Age-related osteoporosis without current pathological fracture: Secondary | ICD-10-CM

## 2015-08-25 MED ORDER — DENOSUMAB 60 MG/ML ~~LOC~~ SOLN
60.0000 mg | Freq: Once | SUBCUTANEOUS | Status: AC
  Administered 2015-08-25: 60 mg via SUBCUTANEOUS

## 2015-08-25 NOTE — Progress Notes (Signed)
Pre visit review using our clinic review tool, if applicable. No additional management support is needed unless otherwise documented below in the visit note.  Patient in office today for Prolia injection. SQ given in Left Arm. Patient tolerated injection well. No signs or symptoms of a reaction prior to patient leaving the visit.

## 2015-08-26 ENCOUNTER — Other Ambulatory Visit: Payer: Self-pay | Admitting: Family Medicine

## 2015-09-01 ENCOUNTER — Ambulatory Visit (INDEPENDENT_AMBULATORY_CARE_PROVIDER_SITE_OTHER): Payer: Medicare Other | Admitting: *Deleted

## 2015-09-01 DIAGNOSIS — Z95810 Presence of automatic (implantable) cardiac defibrillator: Secondary | ICD-10-CM

## 2015-09-01 DIAGNOSIS — G4733 Obstructive sleep apnea (adult) (pediatric): Secondary | ICD-10-CM

## 2015-09-01 NOTE — Progress Notes (Signed)
Remote ICD transmission.   

## 2015-09-02 ENCOUNTER — Telehealth: Payer: Self-pay | Admitting: Pulmonary Disease

## 2015-09-02 DIAGNOSIS — G4733 Obstructive sleep apnea (adult) (pediatric): Secondary | ICD-10-CM

## 2015-09-02 NOTE — Telephone Encounter (Signed)
Noted entered in error.

## 2015-09-02 NOTE — Telephone Encounter (Signed)
HST 09/01/15 >> AHI 8.2, SaO2 low 85%   Will have my nurse inform pt that sleep study shows mild obstructive sleep apnea.  Please forward copy of sleep study to her DME.  Please proceed with arranging for new DME and ensure she gets new CPAP mask and supplies.

## 2015-09-05 ENCOUNTER — Other Ambulatory Visit: Payer: Self-pay | Admitting: Family Medicine

## 2015-09-06 DIAGNOSIS — G4733 Obstructive sleep apnea (adult) (pediatric): Secondary | ICD-10-CM

## 2015-09-06 NOTE — Telephone Encounter (Signed)
Results have been explained to patient, pt expressed understanding. Aware that the order will be placed and taken care of on 09/07/15.  Nothing further needed.

## 2015-09-07 ENCOUNTER — Other Ambulatory Visit: Payer: Self-pay | Admitting: *Deleted

## 2015-09-07 DIAGNOSIS — G4733 Obstructive sleep apnea (adult) (pediatric): Secondary | ICD-10-CM

## 2015-09-08 ENCOUNTER — Encounter: Payer: Self-pay | Admitting: Cardiology

## 2015-09-14 ENCOUNTER — Telehealth: Payer: Self-pay | Admitting: Pulmonary Disease

## 2015-09-14 DIAGNOSIS — G4733 Obstructive sleep apnea (adult) (pediatric): Secondary | ICD-10-CM

## 2015-09-14 LAB — CUP PACEART REMOTE DEVICE CHECK
Battery Remaining Longevity: 4 mo
Battery Remaining Percentage: 4 %
Battery Voltage: 2.62 V
Date Time Interrogation Session: 20170824061402
HIGH POWER IMPEDANCE MEASURED VALUE: 41 Ohm
Implantable Lead Implant Date: 20051206
Implantable Lead Implant Date: 20051206
Implantable Lead Implant Date: 20051206
Implantable Lead Location: 753859
Implantable Lead Location: 753860
Implantable Lead Model: 1581
Lead Channel Impedance Value: 380 Ohm
Lead Channel Impedance Value: 430 Ohm
Lead Channel Pacing Threshold Pulse Width: 0.5 ms
Lead Channel Sensing Intrinsic Amplitude: 11.5 mV
Lead Channel Setting Pacing Amplitude: 2.5 V
Lead Channel Setting Pacing Pulse Width: 0.7 ms
MDC IDC LEAD LOCATION: 753860
MDC IDC MSMT LEADCHNL LV PACING THRESHOLD AMPLITUDE: 1.5 V
MDC IDC MSMT LEADCHNL LV PACING THRESHOLD PULSEWIDTH: 0.8 ms
MDC IDC MSMT LEADCHNL RA PACING THRESHOLD AMPLITUDE: 0.75 V
MDC IDC MSMT LEADCHNL RA SENSING INTR AMPL: 2.2 mV
MDC IDC MSMT LEADCHNL RV IMPEDANCE VALUE: 730 Ohm
MDC IDC MSMT LEADCHNL RV PACING THRESHOLD AMPLITUDE: 1.5 V
MDC IDC MSMT LEADCHNL RV PACING THRESHOLD PULSEWIDTH: 0.7 ms
MDC IDC PG SERIAL: 815822
MDC IDC SET LEADCHNL LV PACING PULSEWIDTH: 0.8 ms
MDC IDC SET LEADCHNL RA PACING AMPLITUDE: 2 V
MDC IDC SET LEADCHNL RV PACING AMPLITUDE: 2.5 V
MDC IDC SET LEADCHNL RV SENSING SENSITIVITY: 0.5 mV
MDC IDC STAT BRADY AP VP PERCENT: 1 %
MDC IDC STAT BRADY AP VS PERCENT: 1 %
MDC IDC STAT BRADY AS VP PERCENT: 91 %
MDC IDC STAT BRADY AS VS PERCENT: 4.7 %
MDC IDC STAT BRADY RA PERCENT PACED: 1 %

## 2015-09-14 NOTE — Telephone Encounter (Signed)
Phone # is [phone].Tracy GriffinsStanley A [name]

## 2015-09-14 NOTE — Telephone Encounter (Signed)
Error.  Duplicate charts.

## 2015-09-14 NOTE — Telephone Encounter (Deleted)
But under wrong account, pt has two.Tracy GriffinsStanley A [name]

## 2015-09-14 NOTE — Telephone Encounter (Signed)
Called and spoke with pt and she is aware of order that has been sent in [facility].  She is aware that they will contact her to set up her cpap.

## 2015-09-19 ENCOUNTER — Ambulatory Visit (INDEPENDENT_AMBULATORY_CARE_PROVIDER_SITE_OTHER): Payer: Medicare Other | Admitting: Family Medicine

## 2015-09-19 ENCOUNTER — Encounter: Payer: Self-pay | Admitting: Family Medicine

## 2015-09-19 VITALS — BP 118/70 | HR 87 | Temp 98.1°F | Ht 60.75 in | Wt 131.6 lb

## 2015-09-19 DIAGNOSIS — Z0000 Encounter for general adult medical examination without abnormal findings: Secondary | ICD-10-CM

## 2015-09-19 DIAGNOSIS — E039 Hypothyroidism, unspecified: Secondary | ICD-10-CM

## 2015-09-19 DIAGNOSIS — Z23 Encounter for immunization: Secondary | ICD-10-CM

## 2015-09-19 DIAGNOSIS — E785 Hyperlipidemia, unspecified: Secondary | ICD-10-CM | POA: Diagnosis not present

## 2015-09-19 NOTE — Progress Notes (Signed)
Pre visit review using our clinic review tool, if applicable. No additional management support is needed unless otherwise documented below in the visit note. 

## 2015-09-19 NOTE — Progress Notes (Signed)
Subjective:   [name] is a 82 y.o. female who presents for Medicare Annual (Subsequent) preventive examination.  Review of Systems:   Review of Systems  Constitutional: Negative for activity change, appetite change and fatigue.  HENT: Negative for hearing loss, congestion, tinnitus and ear discharge.   Eyes: Negative for visual disturbance (see optho q1y -- vision corrected to 20/20 with glasses).  Respiratory: Negative for cough, chest tightness and shortness of breath.   Cardiovascular: Negative for chest pain, palpitations and leg swelling.  Gastrointestinal: Negative for abdominal pain, diarrhea, constipation and abdominal distention.  Genitourinary: Negative for urgency, frequency, decreased urine volume and difficulty urinating.  Musculoskeletal: Negative for back pain, arthralgias and gait problem.  Skin: Negative for color change, pallor and rash.  Neurological: Negative for dizziness, light-headedness, numbness and headaches.  Hematological: Negative for adenopathy. Does not bruise/bleed easily.  Psychiatric/Behavioral: Negative for suicidal ideas, confusion, sleep disturbance, self-injury, dysphoric mood, decreased concentration and agitation.  Pt is able to read and write and can do all ADLs No risk for falling No abuse/ violence in home          Objective:     Vitals: BP 118/70 (BP Location: Left Arm, Patient Position: Sitting, Cuff Size: Small)   Pulse 87   Temp 98.1 F (36.7 C) (Oral)   Ht 5' 0.75" (1.543 m)   Wt 131 lb 9.6 oz (59.7 kg)   SpO2 96%   BMI 25.07 kg/m   Body mass index is 25.07 kg/m. BP 118/70 (BP Location: Left Arm, Patient Position: Sitting, Cuff Size: Small)   Pulse 87   Temp 98.1 F (36.7 C) (Oral)   Ht 5' 0.75" (1.543 m)   Wt 131 lb 9.6 oz (59.7 kg)   SpO2 96%   BMI 25.07 kg/m  General appearance: alert, cooperative, appears stated age and no distress Head: Normocephalic, without obvious abnormality, atraumatic Eyes:  conjunctivae/corneas clear. PERRL, EOM's intact. Fundi benign. Ears: normal TM's and external ear canals both ears Nose: Nares normal. Septum midline. Mucosa normal. No drainage or sinus tenderness. Throat: lips, mucosa, and tongue normal; teeth and gums normal Neck: no adenopathy, no carotid bruit, no JVD, supple, symmetrical, trachea midline and thyroid not enlarged, symmetric, no tenderness/mass/nodules Back: symmetric, no curvature. ROM normal. No CVA tenderness. Lungs: clear to auscultation bilaterally Breasts: normal appearance, no masses or tenderness Heart: regular rate and rhythm, S1, S2 normal, no murmur, click, rub or gallop Abdomen: soft, non-tender; bowel sounds normal; no masses,  no organomegaly Pelvic: not indicated; status post hysterectomy, negative ROS Extremities: extremities normal, atraumatic, no cyanosis or edema Pulses: 2+ and symmetric Skin: Skin color, texture, turgor normal. No rashes or lesions Lymph nodes: Cervical, supraclavicular, and axillary nodes normal. Neurologic: pt is in a wheelchair,  AAOx3   Tobacco History  Smoking Status  . Never Smoker  Smokeless Tobacco  . Never Used     Counseling given: Not Answered   Past Medical History:  Diagnosis Date  . Anxiety    sleep  . Arthritis   . Automatic implantable cardioverter-defibrillator in situ   . Automatic implantable cardioverter/defibrillator (AICD) activation    s/p implantable cardioverter-defibrillator bivenrticular pacer. 12/05. St, Jude [employer]. remote-no.  [name] CAD (coronary artery disease)    a. s/p prior anterior wall myocardial infarction and multiple percutaneous coronary intervention.;  b. Aden MV 1/14:  low risk, inf and apical defect likely due to atten artifact, no ischemia, EF 35% (visually normal and est 55-60%)  . Chest pain  12/09   recent hospitalization for it. felt to be noncardiac.   [name] CHF (congestive heart failure) (HCC)   . DD (diverticular disease)    Hx of it - severe.  Left colon 2004  . Esophageal stricture    last dialated 2007  . Frequent urination at night   . GERD (gastroesophageal reflux disease)   . Heart murmur   . Hiatal hernia   . History of recurrent UTIs   . HTN (hypertension)   . Hyperlipidemia   . Hypothyroidism   . Ischemic cardiomyopathy    EF 25-30% improved to 45% with biventricular pacing.   . Other urinary problems    urinary incontenance  . Pacemaker   . PVT (paroxysmal ventricular tachycardia) (HCC)   . Shortness of breath    with exertion  . Sleep apnea    has a Cpap machine  . Systolic heart failure    improved to class I to II   . Urgency of urination   . UTI (lower urinary tract infection)    recurrent. (Klebsiella pneumoniae). Last Cx 02/03/09. (R-ancef, nitrofurantoin, Augmentin, Zyosyn)   Past Surgical History:  Procedure Laterality Date  . ANGIOPLASTY     stent  . APPENDECTOMY    . CARDIAC CATHETERIZATION     stent  . CARDIAC DEFIBRILLATOR PLACEMENT     St Jude Atlas  . CATARACT EXTRACTION, BILATERAL    . COLONOSCOPY W/ POLYPECTOMY    . HAMMER TOE SURGERY Left 01/15/2013   Procedure: LEFT SECOND THROUGH FOURTH HAMMERTOE CORRECTION ;  Surgeon: Toni Arthurs, MD;  Location: [facility] OR;  Service: Orthopedics;  Laterality: Left;  . HAMMERTOE RECONSTRUCTION WITH WEIL OSTEOTOMY Right 07/17/2012   Procedure: RIGHT SECOND AND THIRD MT WEIL OSTEOTOMIES AND SECOND AND THIRD HAMMERTOE CORRECTIONS;  Surgeon: Toni Arthurs, MD;  Location: [facility] OR;  Service: Orthopedics;  Laterality: Right;  . INSERT / REPLACE / REMOVE PACEMAKER    . TONSILLECTOMY    . TOTAL KNEE ARTHROPLASTY     bilateral  . TUBAL LIGATION    . unspecified area hysterectomy    . WEIL OSTEOTOMY Left 01/15/2013   Procedure:  WEIL OSTEOTOMY AND DORSAL CAPSULOTOMY;  Surgeon: Toni Arthurs, MD;  Location: [facility] OR;  Service: Orthopedics;  Laterality: Left;   Family History  Problem Relation Age of Onset  . Colon cancer Neg Hx   . Colon polyps Sister   . Heart disease  Mother   . Heart disease Father   . Heart disease Brother     multiple brothers  . Heart disease Sister     multiple sisters  . Prostate cancer Brother   . Diabetes Daughter   . Diabetes      aunt  . Breast cancer Daughter   . Alcohol abuse Son   . Brain cancer Son   . Lung cancer Son    History  Sexual Activity  . Sexual activity: Not on file    Outpatient Encounter Prescriptions as of 09/19/2015  Medication Sig  . albuterol (PROVENTIL HFA;VENTOLIN HFA) 108 (90 Base) MCG/ACT inhaler Inhale 2 puffs into the lungs every 6 (six) hours as needed for wheezing or shortness of breath.  [name] aspirin (ASPIR-81) 81 MG EC tablet Take 81 mg by mouth daily.    . beclomethasone (QVAR) 40 MCG/ACT inhaler Inhale 2 puffs into the lungs 2 (two) times daily.  . furosemide (LASIX) 20 MG tablet Take 1 tablet (20 mg total) by mouth daily as needed for fluid or edema.  [name]  isosorbide mononitrate (IMDUR) 30 MG 24 hr tablet TAKE ONE & ONE-HALF TABLETS BY MOUTH IN THE MORNING  . losartan (COZAAR) 50 MG tablet TAKE ONE TABLET BY MOUTH ONCE DAILY  . metoprolol succinate (TOPROL-XL) 50 MG 24 hr tablet TAKE ONE TABLET BY MOUTH TWICE DAILY WITH OR IMMEDIATELY FOLLOWING MEAL  . nitroGLYCERIN (NITROSTAT) 0.4 MG SL tablet Place 1 tablet (0.4 mg total) under the tongue every 5 (five) minutes as needed. At the onset of chest pain, Up to 3 doses  . NON FORMULARY C-Pap machine, uses nightly for sleep apnea  . pravastatin (PRAVACHOL) 20 MG tablet TAKE ONE TABLET BY MOUTH ONCE DAILY  . pravastatin (PRAVACHOL) 20 MG tablet TAKE ONE TABLET BY MOUTH ONCE DAILY  . Psyllium (METAFIBER) 48.57 % POWD Take 10 mLs by mouth every evening.   . [DISCONTINUED] levothyroxine (SYNTHROID, LEVOTHROID) 112 MCG tablet Take 1 tablet (112 mcg total) by mouth daily before breakfast.   No facility-administered encounter medications on file as of 09/19/2015.     Activities of Daily Living In your present state of health, do you have any  difficulty performing the following activities: 09/19/2015  Hearing? N  Vision? N  Difficulty concentrating or making decisions? Y  Walking or climbing stairs? Y  Dressing or bathing? N  Doing errands, shopping? N  Some recent data might be hidden    Patient Care Team: Donato Schultz, DO as PCP - General (Family Medicine) Vladimir Faster, DO as Consulting Physician (Neurology) Hart Carwin, MD as Consulting Physician (Gastroenterology) Duke Salvia, MD as Consulting Physician (Cardiology) Lenda Kelp, MD as Consulting Physician (Sports Medicine)    Assessment:    cpe Exercise Activities and Dietary recommendations Current Exercise Habits: Home exercise routine, Type of exercise: strength training/weights;Other - see comments (stationary bike), Time (Minutes): 15, Frequency (Times/Week): 6, Weekly Exercise (Minutes/Week): 90, Intensity: Mild, Exercise limited by: orthopedic condition(s)  Goals    None     Fall Risk Fall Risk  09/19/2015 01/07/2015 12/28/2013 11/20/2012  Falls in the past year? No No No No   Depression Screen PHQ 2/9 Scores 09/19/2015 01/07/2015 12/28/2013 11/20/2012  PHQ - 2 Score 0 1 0 0     Cognitive Testing MMSE - Mini Mental State Exam 09/19/2015  Orientation to time 5  Orientation to Place 5  Registration 3  Attention/ Calculation 5  Recall 2  Language- name 2 objects 2  Language- repeat 1  Language- follow 3 step command 3  Language- read & follow direction 1  Write a sentence 1  Copy design 1  Total score 29    Immunization History  Administered Date(s) Administered  . Influenza Split 10/09/2011  . Influenza, High Dose Seasonal PF 10/08/2013, 09/19/2015  . Influenza,inj,Quad PF,36+ Mos 10/13/2012, 10/01/2014  . Pneumococcal Conjugate-13 02/18/2014  . Pneumococcal Polysaccharide-23 09/19/2015  . Td 08/05/2013   Screening Tests Health Maintenance  Topic Date Due  . ZOSTAVAX  01/07/2016 (Originally 05/03/1981)  . TETANUS/TDAP   08/06/2023  . INFLUENZA VACCINE  Completed  . DEXA SCAN  Completed  . PNA vac Low Risk Adult  Completed      Plan:   see AVS During the course of the visit the patient was educated and counseled about the following appropriate screening and preventive services:   Vaccines to include Pneumoccal, Influenza, Hepatitis B, Td, Zostavax, HCV  Electrocardiogram  Cardiovascular Disease  Colorectal cancer screening  Bone density screening  Diabetes screening  Glaucoma screening  Mammography/PAP  Nutrition counseling   Patient Instructions (the written plan) was given to the patient.  1. Need for prophylactic vaccination and inoculation against influenza   - Flu vaccine HIGH DOSE PF (Fluzone High dose)  2. Need for pneumococcal vaccination   - Pneumococcal polysaccharide vaccine 23-valent greater than or equal to 2yo subcutaneous/IM  3. Hypothyroidism, unspecified hypothyroidism type Check labs  con't meds - TSH  4. Hyperlipidemia LDL goal <100 Check labs  - Comprehensive metabolic panel - Lipid panel - CBC with Differential/Platelet - POCT urinalysis dipstick  Donato SchultzYvonne R Lowne Chase, DO  09/21/2015

## 2015-09-19 NOTE — Patient Instructions (Signed)
Preventive Care for Adults, Female A healthy lifestyle and preventive care can promote health and wellness. Preventive health guidelines for women include the following key practices.  A routine yearly physical is a good way to check with your health care provider about your health and preventive screening. It is a chance to share any concerns and updates on your health and to receive a thorough exam.  Visit your dentist for a routine exam and preventive care every 6 months. Brush your teeth twice a day and floss once a day. Good oral hygiene prevents tooth decay and gum disease.  The frequency of eye exams is based on your age, health, family medical history, use of contact lenses, and other factors. Follow your health care provider's recommendations for frequency of eye exams.  Eat a healthy diet. Foods like vegetables, fruits, whole grains, low-fat dairy products, and lean protein foods contain the nutrients you need without too many calories. Decrease your intake of foods high in solid fats, added sugars, and salt. Eat the right amount of calories for you.Get information about a proper diet from your health care provider, if necessary.  Regular physical exercise is one of the most important things you can do for your health. Most adults should get at least 150 minutes of moderate-intensity exercise (any activity that increases your heart rate and causes you to sweat) each week. In addition, most adults need muscle-strengthening exercises on 2 or more days a week.  Maintain a healthy weight. The body mass index (BMI) is a screening tool to identify possible weight problems. It provides an estimate of body fat based on height and weight. Your health care provider can find your BMI and can help you achieve or maintain a healthy weight.For adults 20 years and older:  A BMI below 18.5 is considered underweight.  A BMI of 18.5 to 24.9 is normal.  A BMI of 25 to 29.9 is considered overweight.  A  BMI of 30 and above is considered obese.  Maintain normal blood lipids and cholesterol levels by exercising and minimizing your intake of saturated fat. Eat a balanced diet with plenty of fruit and vegetables. Blood tests for lipids and cholesterol should begin at age 45 and be repeated every 5 years. If your lipid or cholesterol levels are high, you are over 50, or you are at high risk for heart disease, you may need your cholesterol levels checked more frequently.Ongoing high lipid and cholesterol levels should be treated with medicines if diet and exercise are not working.  If you smoke, find out from your health care provider how to quit. If you do not use tobacco, do not start.  Lung cancer screening is recommended for adults aged 45-80 years who are at high risk for developing lung cancer because of a history of smoking. A yearly low-dose CT scan of the lungs is recommended for people who have at least a 30-pack-year history of smoking and are a current smoker or have quit within the past 15 years. A pack year of smoking is smoking an average of 1 pack of cigarettes a day for 1 year (for example: 1 pack a day for 30 years or 2 packs a day for 15 years). Yearly screening should continue until the smoker has stopped smoking for at least 15 years. Yearly screening should be stopped for people who develop a health problem that would prevent them from having lung cancer treatment.  If you are pregnant, do not drink alcohol. If you are  breastfeeding, be very cautious about drinking alcohol. If you are not pregnant and choose to drink alcohol, do not have more than 1 drink per day. One drink is considered to be 12 ounces (355 mL) of beer, 5 ounces (148 mL) of wine, or 1.5 ounces (44 mL) of liquor.  Avoid use of street drugs. Do not share needles with anyone. Ask for help if you need support or instructions about stopping the use of drugs.  High blood pressure causes heart disease and increases the risk  of stroke. Your blood pressure should be checked at least every 1 to 2 years. Ongoing high blood pressure should be treated with medicines if weight loss and exercise do not work.  If you are 55-79 years old, ask your health care provider if you should take aspirin to prevent strokes.  Diabetes screening is done by taking a blood sample to check your blood glucose level after you have not eaten for a certain period of time (fasting). If you are not overweight and you do not have risk factors for diabetes, you should be screened once every 3 years starting at age 45. If you are overweight or obese and you are 40-70 years of age, you should be screened for diabetes every year as part of your cardiovascular risk assessment.  Breast cancer screening is essential preventive care for women. You should practice "breast self-awareness." This means understanding the normal appearance and feel of your breasts and may include breast self-examination. Any changes detected, no matter how small, should be reported to a health care provider. Women in their 20s and 30s should have a clinical breast exam (CBE) by a health care provider as part of a regular health exam every 1 to 3 years. After age 40, women should have a CBE every year. Starting at age 40, women should consider having a mammogram (breast X-ray test) every year. Women who have a family history of breast cancer should talk to their health care provider about genetic screening. Women at a high risk of breast cancer should talk to their health care providers about having an MRI and a mammogram every year.  Breast cancer gene (BRCA)-related cancer risk assessment is recommended for women who have family members with BRCA-related cancers. BRCA-related cancers include breast, ovarian, tubal, and peritoneal cancers. Having family members with these cancers may be associated with an increased risk for harmful changes (mutations) in the breast cancer genes BRCA1 and  BRCA2. Results of the assessment will determine the need for genetic counseling and BRCA1 and BRCA2 testing.  Your health care provider may recommend that you be screened regularly for cancer of the pelvic organs (ovaries, uterus, and vagina). This screening involves a pelvic examination, including checking for microscopic changes to the surface of your cervix (Pap test). You may be encouraged to have this screening done every 3 years, beginning at age 21.  For women ages 30-65, health care providers may recommend pelvic exams and Pap testing every 3 years, or they may recommend the Pap and pelvic exam, combined with testing for human papilloma virus (HPV), every 5 years. Some types of HPV increase your risk of cervical cancer. Testing for HPV may also be done on women of any age with unclear Pap test results.  Other health care providers may not recommend any screening for nonpregnant women who are considered low risk for pelvic cancer and who do not have symptoms. Ask your health care provider if a screening pelvic exam is right for   you.  If you have had past treatment for cervical cancer or a condition that could lead to cancer, you need Pap tests and screening for cancer for at least 20 years after your treatment. If Pap tests have been discontinued, your risk factors (such as having a new sexual partner) need to be reassessed to determine if screening should resume. Some women have medical problems that increase the chance of getting cervical cancer. In these cases, your health care provider may recommend more frequent screening and Pap tests.  Colorectal cancer can be detected and often prevented. Most routine colorectal cancer screening begins at the age of 50 years and continues through age 75 years. However, your health care provider may recommend screening at an earlier age if you have risk factors for colon cancer. On a yearly basis, your health care provider may provide home test kits to check  for hidden blood in the stool. Use of a small camera at the end of a tube, to directly examine the colon (sigmoidoscopy or colonoscopy), can detect the earliest forms of colorectal cancer. Talk to your health care provider about this at age 50, when routine screening begins. Direct exam of the colon should be repeated every 5-10 years through age 75 years, unless early forms of precancerous polyps or small growths are found.  People who are at an increased risk for hepatitis B should be screened for this virus. You are considered at high risk for hepatitis B if:  You were born in a country where hepatitis B occurs often. Talk with your health care provider about which countries are considered high risk.  Your parents were born in a high-risk country and you have not received a shot to protect against hepatitis B (hepatitis B vaccine).  You have HIV or AIDS.  You use needles to inject street drugs.  You live with, or have sex with, someone who has hepatitis B.  You get hemodialysis treatment.  You take certain medicines for conditions like cancer, organ transplantation, and autoimmune conditions.  Hepatitis C blood testing is recommended for all people born from 1945 through 1965 and any individual with known risks for hepatitis C.  Practice safe sex. Use condoms and avoid high-risk sexual practices to reduce the spread of sexually transmitted infections (STIs). STIs include gonorrhea, chlamydia, syphilis, trichomonas, herpes, HPV, and human immunodeficiency virus (HIV). Herpes, HIV, and HPV are viral illnesses that have no cure. They can result in disability, cancer, and death.  You should be screened for sexually transmitted illnesses (STIs) including gonorrhea and chlamydia if:  You are sexually active and are younger than 24 years.  You are older than 24 years and your health care provider tells you that you are at risk for this type of infection.  Your sexual activity has changed  since you were last screened and you are at an increased risk for chlamydia or gonorrhea. Ask your health care provider if you are at risk.  If you are at risk of being infected with HIV, it is recommended that you take a prescription medicine daily to prevent HIV infection. This is called preexposure prophylaxis (PrEP). You are considered at risk if:  You are sexually active and do not regularly use condoms or know the HIV status of your partner(s).  You take drugs by injection.  You are sexually active with a partner who has HIV.  Talk with your health care provider about whether you are at high risk of being infected with HIV. If   you choose to begin PrEP, you should first be tested for HIV. You should then be tested every 3 months for as long as you are taking PrEP.  Osteoporosis is a disease in which the bones lose minerals and strength with aging. This can result in serious bone fractures or breaks. The risk of osteoporosis can be identified using a bone density scan. Women ages 67 years and over and women at risk for fractures or osteoporosis should discuss screening with their health care providers. Ask your health care provider whether you should take a calcium supplement or vitamin D to reduce the rate of osteoporosis.  Menopause can be associated with physical symptoms and risks. Hormone replacement therapy is available to decrease symptoms and risks. You should talk to your health care provider about whether hormone replacement therapy is right for you.  Use sunscreen. Apply sunscreen liberally and repeatedly throughout the day. You should seek shade when your shadow is shorter than you. Protect yourself by wearing long sleeves, pants, a wide-brimmed hat, and sunglasses year round, whenever you are outdoors.  Once a month, do a whole body skin exam, using a mirror to look at the skin on your back. Tell your health care provider of new moles, moles that have irregular borders, moles that  are larger than a pencil eraser, or moles that have changed in shape or color.  Stay current with required vaccines (immunizations).  Influenza vaccine. All adults should be immunized every year.  Tetanus, diphtheria, and acellular pertussis (Td, Tdap) vaccine. Pregnant women should receive 1 dose of Tdap vaccine during each pregnancy. The dose should be obtained regardless of the length of time since the last dose. Immunization is preferred during the 27th-36th week of gestation. An adult who has not previously received Tdap or who does not know her vaccine status should receive 1 dose of Tdap. This initial dose should be followed by tetanus and diphtheria toxoids (Td) booster doses every 10 years. Adults with an unknown or incomplete history of completing a 3-dose immunization series with Td-containing vaccines should begin or complete a primary immunization series including a Tdap dose. Adults should receive a Td booster every 10 years.  Varicella vaccine. An adult without evidence of immunity to varicella should receive 2 doses or a second dose if she has previously received 1 dose. Pregnant females who do not have evidence of immunity should receive the first dose after pregnancy. This first dose should be obtained before leaving the health care facility. The second dose should be obtained 4-8 weeks after the first dose.  Human papillomavirus (HPV) vaccine. Females aged 13-26 years who have not received the vaccine previously should obtain the 3-dose series. The vaccine is not recommended for use in pregnant females. However, pregnancy testing is not needed before receiving a dose. If a female is found to be pregnant after receiving a dose, no treatment is needed. In that case, the remaining doses should be delayed until after the pregnancy. Immunization is recommended for any person with an immunocompromised condition through the age of 61 years if she did not get any or all doses earlier. During the  3-dose series, the second dose should be obtained 4-8 weeks after the first dose. The third dose should be obtained 24 weeks after the first dose and 16 weeks after the second dose.  Zoster vaccine. One dose is recommended for adults aged 30 years or older unless certain conditions are present.  Measles, mumps, and rubella (MMR) vaccine. Adults born  before 1957 generally are considered immune to measles and mumps. Adults born in 1957 or later should have 1 or more doses of MMR vaccine unless there is a contraindication to the vaccine or there is laboratory evidence of immunity to each of the three diseases. A routine second dose of MMR vaccine should be obtained at least 28 days after the first dose for students attending postsecondary schools, health care workers, or international travelers. People who received inactivated measles vaccine or an unknown type of measles vaccine during 1963-1967 should receive 2 doses of MMR vaccine. People who received inactivated mumps vaccine or an unknown type of mumps vaccine before 1979 and are at high risk for mumps infection should consider immunization with 2 doses of MMR vaccine. For females of childbearing age, rubella immunity should be determined. If there is no evidence of immunity, females who are not pregnant should be vaccinated. If there is no evidence of immunity, females who are pregnant should delay immunization until after pregnancy. Unvaccinated health care workers born before 1957 who lack laboratory evidence of measles, mumps, or rubella immunity or laboratory confirmation of disease should consider measles and mumps immunization with 2 doses of MMR vaccine or rubella immunization with 1 dose of MMR vaccine.  Pneumococcal 13-valent conjugate (PCV13) vaccine. When indicated, a person who is uncertain of his immunization history and has no record of immunization should receive the PCV13 vaccine. All adults 65 years of age and older should receive this  vaccine. An adult aged 19 years or older who has certain medical conditions and has not been previously immunized should receive 1 dose of PCV13 vaccine. This PCV13 should be followed with a dose of pneumococcal polysaccharide (PPSV23) vaccine. Adults who are at high risk for pneumococcal disease should obtain the PPSV23 vaccine at least 8 weeks after the dose of PCV13 vaccine. Adults older than 80 years of age who have normal immune system function should obtain the PPSV23 vaccine dose at least 1 year after the dose of PCV13 vaccine.  Pneumococcal polysaccharide (PPSV23) vaccine. When PCV13 is also indicated, PCV13 should be obtained first. All adults aged 65 years and older should be immunized. An adult younger than age 65 years who has certain medical conditions should be immunized. Any person who resides in a nursing home or long-term care facility should be immunized. An adult smoker should be immunized. People with an immunocompromised condition and certain other conditions should receive both PCV13 and PPSV23 vaccines. People with human immunodeficiency virus (HIV) infection should be immunized as soon as possible after diagnosis. Immunization during chemotherapy or radiation therapy should be avoided. Routine use of PPSV23 vaccine is not recommended for American Indians, [place] Natives, or people younger than 65 years unless there are medical conditions that require PPSV23 vaccine. When indicated, people who have unknown immunization and have no record of immunization should receive PPSV23 vaccine. One-time revaccination 5 years after the first dose of PPSV23 is recommended for people aged 19-64 years who have chronic kidney failure, nephrotic syndrome, asplenia, or immunocompromised conditions. People who received 1-2 doses of PPSV23 before age 65 years should receive another dose of PPSV23 vaccine at age 65 years or later if at least 5 years have passed since the previous dose. Doses of PPSV23 are not  needed for people immunized with PPSV23 at or after age 65 years.  Meningococcal vaccine. Adults with asplenia or persistent complement component deficiencies should receive 2 doses of quadrivalent meningococcal conjugate (MenACWY-D) vaccine. The doses should be obtained   at least 2 months apart. Microbiologists working with certain meningococcal bacteria, [place] recruits, people at risk during an outbreak, and people who travel to or live in countries with a high rate of meningitis should be immunized. A first-year college student up through age 34 years who is living in a residence hall should receive a dose if she did not receive a dose on or after her 16th birthday. Adults who have certain high-risk conditions should receive one or more doses of vaccine.  Hepatitis A vaccine. Adults who wish to be protected from this disease, have certain high-risk conditions, work with hepatitis A-infected animals, work in hepatitis A research labs, or travel to or work in countries with a high rate of hepatitis A should be immunized. Adults who were previously unvaccinated and who anticipate close contact with an international adoptee during the first 60 days after arrival in the [place] from a country with a high rate of hepatitis A should be immunized.  Hepatitis B vaccine. Adults who wish to be protected from this disease, have certain high-risk conditions, may be exposed to blood or other infectious body fluids, are household contacts or sex partners of hepatitis B positive people, are clients or workers in certain care facilities, or travel to or work in countries with a high rate of hepatitis B should be immunized.  Haemophilus influenzae type b (Hib) vaccine. A previously unvaccinated person with asplenia or sickle cell disease or having a scheduled splenectomy should receive 1 dose of Hib vaccine. Regardless of previous immunization, a recipient of a hematopoietic stem cell transplant should receive a  3-dose series 6-12 months after her successful transplant. Hib vaccine is not recommended for adults with HIV infection. Preventive Services / Frequency Ages 35 to 4 years  Blood pressure check.** / Every 3-5 years.  Lipid and cholesterol check.** / Every 5 years beginning at age 60.  Clinical breast exam.** / Every 3 years for women in their 71s and 10s.  BRCA-related cancer risk assessment.** / For women who have family members with a BRCA-related cancer (breast, ovarian, tubal, or peritoneal cancers).  Pap test.** / Every 2 years from ages 76 through 26. Every 3 years starting at age 61 through age 76 or 93 with a history of 3 consecutive normal Pap tests.  HPV screening.** / Every 3 years from ages 37 through ages 60 to 51 with a history of 3 consecutive normal Pap tests.  Hepatitis C blood test.** / For any individual with known risks for hepatitis C.  Skin self-exam. / Monthly.  Influenza vaccine. / Every year.  Tetanus, diphtheria, and acellular pertussis (Tdap, Td) vaccine.** / Consult your health care provider. Pregnant women should receive 1 dose of Tdap vaccine during each pregnancy. 1 dose of Td every 10 years.  Varicella vaccine.** / Consult your health care provider. Pregnant females who do not have evidence of immunity should receive the first dose after pregnancy.  HPV vaccine. / 3 doses over 6 months, if 93 and younger. The vaccine is not recommended for use in pregnant females. However, pregnancy testing is not needed before receiving a dose.  Measles, mumps, rubella (MMR) vaccine.** / You need at least 1 dose of MMR if you were born in 1957 or later. You may also need a 2nd dose. For females of childbearing age, rubella immunity should be determined. If there is no evidence of immunity, females who are not pregnant should be vaccinated. If there is no evidence of immunity, females who are  pregnant should delay immunization until after pregnancy.  Pneumococcal  13-valent conjugate (PCV13) vaccine.** / Consult your health care provider.  Pneumococcal polysaccharide (PPSV23) vaccine.** / 1 to 2 doses if you smoke cigarettes or if you have certain conditions.  Meningococcal vaccine.** / 1 dose if you are age 68 to 8 years and a Market researcher living in a residence hall, or have one of several medical conditions, you need to get vaccinated against meningococcal disease. You may also need additional booster doses.  Hepatitis A vaccine.** / Consult your health care provider.  Hepatitis B vaccine.** / Consult your health care provider.  Haemophilus influenzae type b (Hib) vaccine.** / Consult your health care provider. Ages 7 to 53 years  Blood pressure check.** / Every year.  Lipid and cholesterol check.** / Every 5 years beginning at age 25 years.  Lung cancer screening. / Every year if you are aged 11-80 years and have a 30-pack-year history of smoking and currently smoke or have quit within the past 15 years. Yearly screening is stopped once you have quit smoking for at least 15 years or develop a health problem that would prevent you from having lung cancer treatment.  Clinical breast exam.** / Every year after age 48 years.  BRCA-related cancer risk assessment.** / For women who have family members with a BRCA-related cancer (breast, ovarian, tubal, or peritoneal cancers).  Mammogram.** / Every year beginning at age 41 years and continuing for as long as you are in good health. Consult with your health care provider.  Pap test.** / Every 3 years starting at age 65 years through age 37 or 70 years with a history of 3 consecutive normal Pap tests.  HPV screening.** / Every 3 years from ages 72 years through ages 60 to 40 years with a history of 3 consecutive normal Pap tests.  Fecal occult blood test (FOBT) of stool. / Every year beginning at age 21 years and continuing until age 5 years. You may not need to do this test if you get  a colonoscopy every 10 years.  Flexible sigmoidoscopy or colonoscopy.** / Every 5 years for a flexible sigmoidoscopy or every 10 years for a colonoscopy beginning at age 35 years and continuing until age 48 years.  Hepatitis C blood test.** / For all people born from [birth year] through 1965 and any individual with known risks for hepatitis C.  Skin self-exam. / Monthly.  Influenza vaccine. / Every year.  Tetanus, diphtheria, and acellular pertussis (Tdap/Td) vaccine.** / Consult your health care provider. Pregnant women should receive 1 dose of Tdap vaccine during each pregnancy. 1 dose of Td every 10 years.  Varicella vaccine.** / Consult your health care provider. Pregnant females who do not have evidence of immunity should receive the first dose after pregnancy.  Zoster vaccine.** / 1 dose for adults aged 30 years or older.  Measles, mumps, rubella (MMR) vaccine.** / You need at least 1 dose of MMR if you were born in 1957 or later. You may also need a second dose. For females of childbearing age, rubella immunity should be determined. If there is no evidence of immunity, females who are not pregnant should be vaccinated. If there is no evidence of immunity, females who are pregnant should delay immunization until after pregnancy.  Pneumococcal 13-valent conjugate (PCV13) vaccine.** / Consult your health care provider.  Pneumococcal polysaccharide (PPSV23) vaccine.** / 1 to 2 doses if you smoke cigarettes or if you have certain conditions.  Meningococcal vaccine.** /  Consult your health care provider.  Hepatitis A vaccine.** / Consult your health care provider.  Hepatitis B vaccine.** / Consult your health care provider.  Haemophilus influenzae type b (Hib) vaccine.** / Consult your health care provider. Ages 64 years and over  Blood pressure check.** / Every year.  Lipid and cholesterol check.** / Every 5 years beginning at age 23 years.  Lung cancer screening. / Every year if you  are aged 16-80 years and have a 30-pack-year history of smoking and currently smoke or have quit within the past 15 years. Yearly screening is stopped once you have quit smoking for at least 15 years or develop a health problem that would prevent you from having lung cancer treatment.  Clinical breast exam.** / Every year after age 74 years.  BRCA-related cancer risk assessment.** / For women who have family members with a BRCA-related cancer (breast, ovarian, tubal, or peritoneal cancers).  Mammogram.** / Every year beginning at age 44 years and continuing for as long as you are in good health. Consult with your health care provider.  Pap test.** / Every 3 years starting at age 58 years through age 22 or 39 years with 3 consecutive normal Pap tests. Testing can be stopped between 65 and 70 years with 3 consecutive normal Pap tests and no abnormal Pap or HPV tests in the past 10 years.  HPV screening.** / Every 3 years from ages 64 years through ages 70 or 61 years with a history of 3 consecutive normal Pap tests. Testing can be stopped between 65 and 70 years with 3 consecutive normal Pap tests and no abnormal Pap or HPV tests in the past 10 years.  Fecal occult blood test (FOBT) of stool. / Every year beginning at age 40 years and continuing until age 27 years. You may not need to do this test if you get a colonoscopy every 10 years.  Flexible sigmoidoscopy or colonoscopy.** / Every 5 years for a flexible sigmoidoscopy or every 10 years for a colonoscopy beginning at age 7 years and continuing until age 32 years.  Hepatitis C blood test.** / For all people born from [birth year] through 1963 and any individual with known risks for hepatitis C.  Osteoporosis screening.** / A one-time screening for women ages 30 years and over and women at risk for fractures or osteoporosis.  Skin self-exam. / Monthly.  Influenza vaccine. / Every year.  Tetanus, diphtheria, and acellular pertussis (Tdap/Td)  vaccine.** / 1 dose of Td every 10 years.  Varicella vaccine.** / Consult your health care provider.  Zoster vaccine.** / 1 dose for adults aged 35 years or older.  Pneumococcal 13-valent conjugate (PCV13) vaccine.** / Consult your health care provider.  Pneumococcal polysaccharide (PPSV23) vaccine.** / 1 dose for all adults aged 46 years and older.  Meningococcal vaccine.** / Consult your health care provider.  Hepatitis A vaccine.** / Consult your health care provider.  Hepatitis B vaccine.** / Consult your health care provider.  Haemophilus influenzae type b (Hib) vaccine.** / Consult your health care provider. ** Family history and personal history of risk and conditions may change your health care provider's recommendations.   This information is not intended to replace advice given to you by your health care provider. Make sure you discuss any questions you have with your health care provider.   Document Released: 02/20/2001 Document Revised: 01/15/2014 Document Reviewed: 05/22/2010 Elsevier Interactive Patient Education [employer].

## 2015-09-20 LAB — LIPID PANEL
CHOL/HDL RATIO: 3
Cholesterol: 115 mg/dL (ref 0–200)
HDL: 37.5 mg/dL — ABNORMAL LOW (ref >39.00)
LDL Cholesterol: 50 mg/dL (ref 0–99)
NONHDL: 77.36
Triglycerides: 137 mg/dL (ref 0.0–149.0)
VLDL: 27.4 mg/dL (ref 0.0–40.0)

## 2015-09-20 LAB — CBC WITH DIFFERENTIAL/PLATELET
BASOS ABS: 0 10*3/uL (ref 0.0–0.1)
Basophils Relative: 0.6 % (ref 0.0–3.0)
EOS PCT: 7.5 % — AB (ref 0.0–5.0)
Eosinophils Absolute: 0.5 10*3/uL (ref 0.0–0.7)
HCT: 36.5 % (ref 36.0–46.0)
HEMOGLOBIN: 12.2 g/dL (ref 12.0–15.0)
Lymphocytes Relative: 27.6 % (ref 12.0–46.0)
Lymphs Abs: 1.9 10*3/uL (ref 0.7–4.0)
MCHC: 33.5 g/dL (ref 30.0–36.0)
MCV: 91.8 fl (ref 78.0–100.0)
MONO ABS: 0.8 10*3/uL (ref 0.1–1.0)
MONOS PCT: 11.8 % (ref 3.0–12.0)
Neutro Abs: 3.6 10*3/uL (ref 1.4–7.7)
Neutrophils Relative %: 52.5 % (ref 43.0–77.0)
Platelets: 205 10*3/uL (ref 150.0–400.0)
RBC: 3.97 Mil/uL (ref 3.87–5.11)
RDW: 13.3 % (ref 11.5–15.5)
WBC: 6.9 10*3/uL (ref 4.0–10.5)

## 2015-09-20 LAB — POCT URINALYSIS DIPSTICK
Bilirubin, UA: NEGATIVE
Blood, UA: NEGATIVE
Glucose, UA: NEGATIVE
KETONES UA: NEGATIVE
LEUKOCYTES UA: NEGATIVE
NITRITE UA: NEGATIVE
PH UA: 6
PROTEIN UA: NEGATIVE
Spec Grav, UA: 1.02
Urobilinogen, UA: 0.2

## 2015-09-20 LAB — COMPREHENSIVE METABOLIC PANEL
ALK PHOS: 62 U/L (ref 39–117)
ALT: 11 U/L (ref 0–35)
AST: 19 U/L (ref 0–37)
Albumin: 4.2 g/dL (ref 3.5–5.2)
BILIRUBIN TOTAL: 0.4 mg/dL (ref 0.2–1.2)
BUN: 18 mg/dL (ref 6–23)
CO2: 33 mEq/L — ABNORMAL HIGH (ref 19–32)
Calcium: 9.7 mg/dL (ref 8.4–10.5)
Chloride: 100 mEq/L (ref 96–112)
Creatinine, Ser: 0.66 mg/dL (ref 0.40–1.20)
GFR: 88.95 mL/min (ref >60.00)
GLUCOSE: 111 mg/dL — AB (ref 70–99)
Potassium: 5 mEq/L (ref 3.5–5.1)
SODIUM: 136 meq/L (ref 135–145)
TOTAL PROTEIN: 6.7 g/dL (ref 6.0–8.3)

## 2015-09-20 LAB — TSH: TSH: 0.12 u[IU]/mL — ABNORMAL LOW (ref 0.35–4.50)

## 2015-09-21 ENCOUNTER — Other Ambulatory Visit: Payer: Self-pay

## 2015-09-21 MED ORDER — LEVOTHYROXINE SODIUM 100 MCG PO TABS: 100.0000 ug | ORAL_TABLET | Freq: Every day | ORAL | 1 refills | Status: DC

## 2015-09-22 ENCOUNTER — Telehealth: Payer: Self-pay | Admitting: Pulmonary Disease

## 2015-09-22 NOTE — Telephone Encounter (Signed)
Called spoke with Larita FifeLynn w/ [facility]. She needs us to fax over pt last OV note.  I have done so. Nothing further needed

## 2015-09-27 ENCOUNTER — Emergency Department (HOSPITAL_COMMUNITY): Payer: Medicare Other

## 2015-09-27 ENCOUNTER — Emergency Department (HOSPITAL_COMMUNITY)
Admission: EM | Admit: 2015-09-27 | Discharge: 2015-09-27 | Disposition: A | Discharge status: Home / Self Care | Payer: Medicare Other | Attending: Emergency Medicine | Admitting: Emergency Medicine

## 2015-09-27 ENCOUNTER — Encounter (HOSPITAL_COMMUNITY): Payer: Self-pay

## 2015-09-27 DIAGNOSIS — S0181XA Laceration without foreign body of other part of head, initial encounter: Secondary | ICD-10-CM | POA: Diagnosis not present

## 2015-09-27 DIAGNOSIS — Y9301 Activity, walking, marching and hiking: Secondary | ICD-10-CM | POA: Insufficient documentation

## 2015-09-27 DIAGNOSIS — W01198A Fall on same level from slipping, tripping and stumbling with subsequent striking against other object, initial encounter: Secondary | ICD-10-CM | POA: Diagnosis not present

## 2015-09-27 DIAGNOSIS — Z79899 Other long term (current) drug therapy: Secondary | ICD-10-CM | POA: Insufficient documentation

## 2015-09-27 DIAGNOSIS — S098XXA Other specified injuries of head, initial encounter: Secondary | ICD-10-CM | POA: Diagnosis not present

## 2015-09-27 DIAGNOSIS — S0083XA Contusion of other part of head, initial encounter: Secondary | ICD-10-CM

## 2015-09-27 DIAGNOSIS — Z7982 Long term (current) use of aspirin: Secondary | ICD-10-CM | POA: Diagnosis not present

## 2015-09-27 DIAGNOSIS — S064X0A Epidural hemorrhage without loss of consciousness, initial encounter: Secondary | ICD-10-CM | POA: Diagnosis not present

## 2015-09-27 DIAGNOSIS — E039 Hypothyroidism, unspecified: Secondary | ICD-10-CM | POA: Insufficient documentation

## 2015-09-27 DIAGNOSIS — Y92009 Unspecified place in unspecified non-institutional (private) residence as the place of occurrence of the external cause: Secondary | ICD-10-CM | POA: Diagnosis not present

## 2015-09-27 DIAGNOSIS — Y999 Unspecified external cause status: Secondary | ICD-10-CM | POA: Insufficient documentation

## 2015-09-27 DIAGNOSIS — I5022 Chronic systolic (congestive) heart failure: Secondary | ICD-10-CM | POA: Insufficient documentation

## 2015-09-27 DIAGNOSIS — I110 Hypertensive heart disease with heart failure: Secondary | ICD-10-CM | POA: Insufficient documentation

## 2015-09-27 DIAGNOSIS — S0990XA Unspecified injury of head, initial encounter: Secondary | ICD-10-CM | POA: Diagnosis not present

## 2015-09-27 DIAGNOSIS — I2510 Atherosclerotic heart disease of native coronary artery without angina pectoris: Secondary | ICD-10-CM | POA: Insufficient documentation

## 2015-09-27 DIAGNOSIS — Z95810 Presence of automatic (implantable) cardiac defibrillator: Secondary | ICD-10-CM | POA: Insufficient documentation

## 2015-09-27 DIAGNOSIS — W19XXXA Unspecified fall, initial encounter: Secondary | ICD-10-CM

## 2015-09-27 MED ORDER — ACETAMINOPHEN 325 MG PO TABS
650.0000 mg | ORAL_TABLET | Freq: Once | ORAL | Status: AC
  Administered 2015-09-27: 650 mg via ORAL
  Filled 2015-09-27: qty 2

## 2015-09-27 MED ORDER — LIDOCAINE HCL (PF) 1 % IJ SOLN
30.0000 mL | Freq: Once | INTRAMUSCULAR | Status: AC
  Administered 2015-09-27: 30 mL via INTRADERMAL
  Filled 2015-09-27: qty 30

## 2015-09-27 NOTE — ED Provider Notes (Signed)
[facility]-EMERGENCY DEPT Provider Note   CSN: [id number] Arrival date & time: 09/27/15  1050     History   Chief Complaint Chief Complaint  Patient presents with  . Fall  . Head Injury    HPI [name] is a 82 y.o. female.  HPI  [name] is a 82 y.o. female with PMH significant for CAD, CHF, HTN, HLD, and PVT who presents with fall that occurred approximately 10 AM.  Patient states she was walking out of the house when she tripped on the threshold of the doorway and landed face first.  Denies LOC or preceding symptoms such as CP, SOB, dizziness, or syncope.  She denies any pain elsewhere. She is not anticoagulated.  She lives at home, is still driving, and ambulates without assistive devices. Symptom onset sudden, constant, and moderate.  Last tetanus within 5 years.   Past Medical History:  Diagnosis Date  . Anxiety    sleep  . Arthritis   . Automatic implantable cardioverter-defibrillator in situ   . Automatic implantable cardioverter/defibrillator (AICD) activation    s/p implantable cardioverter-defibrillator bivenrticular pacer. 12/05. St, Jude Medtronic. remote-no.  [name] CAD (coronary artery disease)    a. s/p prior anterior wall myocardial infarction and multiple percutaneous coronary intervention.;  b. Aden MV 1/14:  low risk, inf and apical defect likely due to atten artifact, no ischemia, EF 35% (visually normal and est 55-60%)  . Chest pain 12/09   recent hospitalization for it. felt to be noncardiac.   [name] CHF (congestive heart failure) (HCC)   . DD (diverticular disease)    Hx of it - severe. Left colon 2004  . Esophageal stricture    last dialated 2007  . Frequent urination at night   . GERD (gastroesophageal reflux disease)   . Heart murmur   . Hiatal hernia   . History of recurrent UTIs   . HTN (hypertension)   . Hyperlipidemia   . Hypothyroidism   . Ischemic cardiomyopathy    EF 25-30% improved to 45% with biventricular pacing.   . Other urinary  problems    urinary incontenance  . Pacemaker   . PVT (paroxysmal ventricular tachycardia) (HCC)   . Shortness of breath    with exertion  . Sleep apnea    has a Cpap machine  . Systolic heart failure    improved to class I to II   . Urgency of urination   . UTI (lower urinary tract infection)    recurrent. (Klebsiella pneumoniae). Last Cx 02/03/09. (R-ancef, nitrofurantoin, Augmentin, Zyosyn)    Patient Active Problem List   Diagnosis Date Noted  . OSA (obstructive sleep apnea) 10/01/2014  . Nocturnal hypoxia 08/27/2014  . Olecranon bursitis of right elbow 08/13/2014  . Dyspnea 04/22/2014  . Physical exam 02/18/2014  . Headache(784.0) 09/11/2013  . Foot abrasion 09/02/2013  . Skin infection 08/19/2013  . Injury to upper leg 08/05/2013  . Need for prophylactic vaccination with tetanus-diphtheria (TD) 08/05/2013  . Hematoma of leg 08/05/2013  . Acute bronchitis 03/19/2013  . Post concussive syndrome 03/08/2013  . Cough 03/06/2013  . Tick bite of left side of sternum 06/04/2012  . Hip pain 03/06/2012  . Hammer toe 03/06/2012  . Anxiety 01/25/2012  . Hives 01/25/2012  . Back pain 01/25/2012  . Cervical dystonia 12/19/2011  . Sinusitis 12/17/2011  . Tremor 12/17/2011  . Osteoporosis 08/07/2011  . Nasal sore 07/02/2011  . Edema 05/24/2011  . Fatigue 05/24/2011  . Seasonal allergic rhinitis  05/24/2011  . Foot joint pain 04/26/2011  . Memory changes 04/16/2011  . Headache causing frequent awakening from sleep 04/16/2011  . Constipation, slow transit 04/16/2011  . Hypothyroid 02/06/2011  . Biventricular implantable cardiac defibrillator-St. Jude 07/25/2010  . St. Jude Riata lead-series of 1500 07/25/2010  . Chest pain 07/25/2010  . Lightheadedness 07/25/2010  . Leg pain 07/25/2010  . PAROXYSMAL VENTRICULAR TACHYCARDIA 11/14/2009  . HYPOTENSION, ORTHOSTATIC, HX OF 04/11/2009  . Chronic cystitis 02/25/2009  . UTI'S, RECURRENT 02/25/2009  . FEMALE STRESS INCONTINENCE  02/25/2009  . POSTMENOPAUSAL ATROPHIC VAGINITIS 02/25/2009  . HEARTBURN 02/25/2009  . PERSONAL HISTORY OF ARTHRITIS 02/25/2009  . Other acquired absence of organ 02/25/2009  . TOTAL KNEE REPLACEMENT, HX OF 02/25/2009  . ESOPHAGEAL STRICTURE 09/03/2008  . WEIGHT LOSS-ABNORMAL 09/03/2008  . NAUSEA 09/03/2008  . ABDOMINAL PAIN -GENERALIZED 09/03/2008  . Hyperlipidemia 10/22/2007  . HYPERTENSION, BENIGN 10/22/2007  . CARDIOMYOPATHY, ISCHEMIC 10/22/2007  . Chronic systolic heart failure (HCC) 10/22/2007    Past Surgical History:  Procedure Laterality Date  . ANGIOPLASTY     stent  . APPENDECTOMY    . CARDIAC CATHETERIZATION     stent  . CARDIAC DEFIBRILLATOR PLACEMENT     St Jude Atlas  . CATARACT EXTRACTION, BILATERAL    . COLONOSCOPY W/ POLYPECTOMY    . HAMMER TOE SURGERY Left 01/15/2013   Procedure: LEFT SECOND THROUGH FOURTH HAMMERTOE CORRECTION ;  Surgeon: Toni Arthurs, MD;  Location: [facility] OR;  Service: Orthopedics;  Laterality: Left;  . HAMMERTOE RECONSTRUCTION WITH WEIL OSTEOTOMY Right 07/17/2012   Procedure: RIGHT SECOND AND THIRD MT WEIL OSTEOTOMIES AND SECOND AND THIRD HAMMERTOE CORRECTIONS;  Surgeon: Toni Arthurs, MD;  Location: [facility] OR;  Service: Orthopedics;  Laterality: Right;  . INSERT / REPLACE / REMOVE PACEMAKER    . TONSILLECTOMY    . TOTAL KNEE ARTHROPLASTY     bilateral  . TUBAL LIGATION    . unspecified area hysterectomy    . WEIL OSTEOTOMY Left 01/15/2013   Procedure:  WEIL OSTEOTOMY AND DORSAL CAPSULOTOMY;  Surgeon: Toni Arthurs, MD;  Location: [facility] OR;  Service: Orthopedics;  Laterality: Left;    OB History    No data available       Home Medications    Prior to Admission medications   Medication Sig Start Date End Date Taking? Authorizing Provider  albuterol (PROVENTIL HFA;VENTOLIN HFA) 108 (90 Base) MCG/ACT inhaler Inhale 2 puffs into the lungs every 6 (six) hours as needed for wheezing or shortness of breath. 08/18/15  Yes Coralyn Helling, MD  aspirin (ASPIR-81)  81 MG EC tablet Take 81 mg by mouth daily.     Yes Historical Provider, MD  beclomethasone (QVAR) 40 MCG/ACT inhaler Inhale 2 puffs into the lungs 2 (two) times daily. 02/18/15  Yes Edward Saguier, PA-C  diphenhydrAMINE (BENADRYL) 25 mg capsule Take 25 mg by mouth every 6 (six) hours as needed for itching or allergies.   Yes Historical Provider, MD  furosemide (LASIX) 20 MG tablet Take 1 tablet (20 mg total) by mouth daily as needed for fluid or edema. 06/29/15  Yes Duke Salvia, MD  isosorbide mononitrate (IMDUR) 30 MG 24 hr tablet TAKE ONE & ONE-HALF TABLETS BY MOUTH IN THE MORNING 04/06/15  Yes Yvonne R Lowne Chase, DO  levothyroxine (SYNTHROID, LEVOTHROID) 112 MCG tablet Take 112 mcg by mouth daily before breakfast.   Yes Historical Provider, MD  losartan (COZAAR) 50 MG tablet TAKE ONE TABLET BY MOUTH ONCE DAILY 09/05/15  Yes Myrene Buddy  R Lowne Chase, DO  metoprolol succinate (TOPROL-XL) 50 MG 24 hr tablet TAKE ONE TABLET BY MOUTH TWICE DAILY WITH OR IMMEDIATELY FOLLOWING MEAL 06/15/15  Yes Yvonne R Lowne Chase, DO  nitroGLYCERIN (NITROSTAT) 0.4 MG SL tablet Place 1 tablet (0.4 mg total) under the tongue every 5 (five) minutes as needed. At the onset of chest pain, Up to 3 doses 12/22/14  Yes Yvonne R Lowne Chase, DO  NON FORMULARY C-Pap machine, uses nightly for sleep apnea   Yes Historical Provider, MD  pravastatin (PRAVACHOL) 20 MG tablet TAKE ONE TABLET BY MOUTH ONCE DAILY 08/26/15  Yes Yvonne R Lowne Chase, DO  Psyllium (METAFIBER) 48.57 % POWD Take 10 mLs by mouth every evening.    Yes Historical Provider, MD  levothyroxine (SYNTHROID, LEVOTHROID) 100 MCG tablet Take 1 tablet (100 mcg total) by mouth daily before breakfast. 09/21/15   Lelon PerlaYvonne R Lowne Chase, DO  pravastatin (PRAVACHOL) 20 MG tablet TAKE ONE TABLET BY MOUTH ONCE DAILY Patient not taking: Reported on 09/27/2015 08/23/15   Donato SchultzYvonne R Lowne Chase, DO    Family History Family History  Problem Relation Age of Onset  . Heart disease Mother     . Heart disease Father   . Colon polyps Sister   . Heart disease Brother     multiple brothers  . Heart disease Sister     multiple sisters  . Prostate cancer Brother   . Diabetes Daughter   . Diabetes      aunt  . Breast cancer Daughter   . Alcohol abuse Son   . Brain cancer Son   . Lung cancer Son   . Colon cancer Neg Hx     Social History Social History  Substance Use Topics  . Smoking status: Never Smoker  . Smokeless tobacco: Never Used  . Alcohol use No     Allergies   Sulfonamide derivatives   Review of Systems Review of Systems All other systems negative unless otherwise stated in HPI   Physical Exam Updated Vital Signs BP 129/66 (BP Location: Left Arm)   Pulse 72   Temp 97.6 F (36.4 C) (Oral)   Resp 18   Ht 5' (1.524 m)   Wt 59 kg   SpO2 98%   BMI 25.39 kg/m   Physical Exam  Constitutional: She is oriented to person, place, and time. She appears well-developed and well-nourished.  Non-toxic appearance. She does not have a sickly appearance. She does not appear ill.  HENT:  Head: Normocephalic.  Mouth/Throat: Oropharynx is clear and moist.  tennis ball sized hematoma to forehead with skin avulsion and superficial 3 cm linear laceration.  Eyes: Conjunctivae are normal. Pupils are equal, round, and reactive to light.  Neck: Normal range of motion. Neck supple.  No cervical midline tenderness.   Cardiovascular: Normal rate and regular rhythm.   Pulmonary/Chest: Effort normal and breath sounds normal. No accessory muscle usage or stridor. No respiratory distress. She has no wheezes. She has no rhonchi. She has no rales.  Abdominal: Soft. Bowel sounds are normal. She exhibits no distension. There is no tenderness.  Musculoskeletal: Normal range of motion.  Lymphadenopathy:    She has no cervical adenopathy.  Neurological: She is alert and oriented to person, place, and time. She has normal strength. No cranial nerve deficit or sensory deficit. GCS  eye subscore is 4. GCS verbal subscore is 5. GCS motor subscore is 6.  Speech clear without dysarthria.  Skin: Skin is warm and dry.  Psychiatric: She has a normal mood and affect. Her behavior is normal.     ED Treatments / Results  Labs (all labs ordered are listed, but only abnormal results are displayed) Labs Reviewed - No data to display  EKG  EKG Interpretation None       Radiology Ct Head Wo Contrast  Result Date: 09/27/2015 CLINICAL DATA:  Tripped over the threshold of a door way, falling and striking the forehead. EXAM: CT HEAD WITHOUT CONTRAST CT CERVICAL SPINE WITHOUT CONTRAST TECHNIQUE: Multidetector CT imaging of the head and cervical spine was performed following the standard protocol without intravenous contrast. Multiplanar CT image reconstructions of the cervical spine were also generated. COMPARISON:  08/20/2013 FINDINGS: CT HEAD FINDINGS Brain: The brain shows generalized atrophy. There chronic small-vessel ischemic changes affecting the cerebral hemispheric white matter, thalamotomy, basal ganglia and pons. There is chronic parenchymal calcification in the left frontoparietal deep white matter, not representing hemorrhage. No sign of mass lesion, hydrocephalus or extra-axial collection. Vascular: There is atherosclerotic calcification of the major vessels at the base of the brain. Skull: No skull fracture. Sinuses/Orbits: No fluid in the sinuses.  No orbital injury. Other: Left frontal scalp hematoma. CT CERVICAL SPINE FINDINGS Alignment: Straightening an abnormal kyphotic curvature in the cervical spine related to previous degenerative disease and fusion. Skull base and vertebrae: No skull base fracture. Chronic osteoarthritis of the C1-2 articulation. Chronic fusion at C2-3. Chronic fusion from C4 through C6. Soft tissues and spinal canal: No soft tissue swelling or visible soft tissue canal compromise. Disc levels: C3-4: Spondylosis and facet arthropathy. Foraminal  stenosis bilaterally. Sufficient patency of the canal and foramina through the fusion region from C4 through C6. C6-7 shows degenerative spondylosis with mild foraminal narrowing. C7-T1 shows facet arthropathy and bulging of the disc with mild foraminal narrowing. Upper chest: No acute finding. Other: Carotid artery atherosclerosis and tortuosity. IMPRESSION: Head CT: No acute intracranial finding. Chronic small-vessel ischemic changes. Chronic calcification in the left frontoparietal white matter, not representing hemorrhage. Frontal scalp hematoma. No skull fracture. Cervical spine CT: No acute or traumatic finding. Extensive postsurgical and degenerative changes as outlined above. Electronically Signed   By: Paulina Fusi M.D.   On: 09/27/2015 12:08   Ct Cervical Spine Wo Contrast  Result Date: 09/27/2015 CLINICAL DATA:  Tripped over the threshold of a door way, falling and striking the forehead. EXAM: CT HEAD WITHOUT CONTRAST CT CERVICAL SPINE WITHOUT CONTRAST TECHNIQUE: Multidetector CT imaging of the head and cervical spine was performed following the standard protocol without intravenous contrast. Multiplanar CT image reconstructions of the cervical spine were also generated. COMPARISON:  08/20/2013 FINDINGS: CT HEAD FINDINGS Brain: The brain shows generalized atrophy. There chronic small-vessel ischemic changes affecting the cerebral hemispheric white matter, thalamotomy, basal ganglia and pons. There is chronic parenchymal calcification in the left frontoparietal deep white matter, not representing hemorrhage. No sign of mass lesion, hydrocephalus or extra-axial collection. Vascular: There is atherosclerotic calcification of the major vessels at the base of the brain. Skull: No skull fracture. Sinuses/Orbits: No fluid in the sinuses.  No orbital injury. Other: Left frontal scalp hematoma. CT CERVICAL SPINE FINDINGS Alignment: Straightening an abnormal kyphotic curvature in the cervical spine related to  previous degenerative disease and fusion. Skull base and vertebrae: No skull base fracture. Chronic osteoarthritis of the C1-2 articulation. Chronic fusion at C2-3. Chronic fusion from C4 through C6. Soft tissues and spinal canal: No soft tissue swelling or visible soft tissue canal compromise. Disc levels: C3-4: Spondylosis and  facet arthropathy. Foraminal stenosis bilaterally. Sufficient patency of the canal and foramina through the fusion region from C4 through C6. C6-7 shows degenerative spondylosis with mild foraminal narrowing. C7-T1 shows facet arthropathy and bulging of the disc with mild foraminal narrowing. Upper chest: No acute finding. Other: Carotid artery atherosclerosis and tortuosity. IMPRESSION: Head CT: No acute intracranial finding. Chronic small-vessel ischemic changes. Chronic calcification in the left frontoparietal white matter, not representing hemorrhage. Frontal scalp hematoma. No skull fracture. Cervical spine CT: No acute or traumatic finding. Extensive postsurgical and degenerative changes as outlined above. Electronically Signed   By: Paulina Fusi M.D.   On: 09/27/2015 12:08    Procedures Procedures (including critical care time)  LACERATION REPAIR Performed by: Cheri Fowler Consent: Verbal consent obtained. Risks and benefits: risks, benefits and alternatives were discussed Patient identity confirmed: provided demographic data Time out performed prior to procedure Prepped and Draped in normal sterile fashion Wound explored Laceration Location: forehead Laceration Length: 3 cm No Foreign Bodies seen or palpated Anesthesia: local infiltration Local anesthetic: lidocaine 1% without epinephrine Anesthetic total: 8 ml Irrigation method: syringe Amount of cleaning: standard Skin closure: 5-0 ethilon Number of sutures or staples: 7 Technique: simple interrupted Patient tolerance: Patient tolerated the procedure well with no immediate complications.   Medications  Ordered in ED Medications  lidocaine (PF) (XYLOCAINE) 1 % injection 30 mL (30 mLs Intradermal Given 09/27/15 1223)  acetaminophen (TYLENOL) tablet 650 mg (650 mg Oral Given 09/27/15 1221)     Initial Impression / Assessment and Plan / ED Course  I have reviewed the triage vital signs and the nursing notes.  Pertinent labs & imaging results that were available during my care of the patient were reviewed by me and considered in my medical decision making (see chart for details).  Clinical Course   Patient presents with head injury after mechanical fall.  VSS, NAD.  No focal neurological deficits.  Large hematoma with laceration to forehead, this was repaired.  Tetanus is up to date.  CT head and neck without acute abnormalities.  Return precautions discussed. Recommend Tylenol and motrin for pain. Follow up PCP or this department for suture removal in 4-5 days.  Stable for discharge.   Case has been discussed with and seen by Dr. Particia Nearing who agrees with the above plan for discharge.  Final Clinical Impressions(s) / ED Diagnoses   Final diagnoses:  Fall, initial encounter  Facial laceration, initial encounter  Hematoma of face, initial encounter    New Prescriptions New Prescriptions   No medications on file     Cheri Fowler, PA-C 09/27/15 1300    Jacalyn Lefevre, MD 09/27/15 [phone]

## 2015-09-27 NOTE — ED Notes (Signed)
Patient transported to CT 

## 2015-09-27 NOTE — ED Triage Notes (Signed)
Per EMS- Patient tripped over the threshold of the doorway, fell and hit her forehead on the floor. Patient now has a hematoma to the forehead. No blood thinners, no LOC. Bleeding controlled.

## 2015-09-27 NOTE — ED Notes (Signed)
Bed: [id number] Expected date:  Expected time:  Means of arrival:  Comments: 82 yo, F, a/o x 4

## 2015-09-27 NOTE — Discharge Instructions (Signed)
Your CT scans of your head and neck today are normal.  Your laceration was repaired with 7 sutures, and these need to be removed in 4-5 days.  This can be done with your primary care physician or this department.  You may alternate between tylenol and motrin for your headache.  Return sooner if you experience pus, increased redness, or warmth from your laceration.  Return if your headache suddenly becomes more severe, you have nausea, vomiting, or any new symptoms.

## 2015-09-29 ENCOUNTER — Other Ambulatory Visit: Payer: Self-pay | Admitting: Family Medicine

## 2015-09-30 ENCOUNTER — Ambulatory Visit (INDEPENDENT_AMBULATORY_CARE_PROVIDER_SITE_OTHER): Payer: Medicare Other | Admitting: *Deleted

## 2015-09-30 ENCOUNTER — Telehealth: Payer: Self-pay | Admitting: Family Medicine

## 2015-09-30 DIAGNOSIS — Z95810 Presence of automatic (implantable) cardiac defibrillator: Secondary | ICD-10-CM

## 2015-09-30 NOTE — Telephone Encounter (Signed)
Pt grand-daughter, Lupita LeashDonna, called about stitches being removed tomorrow. Kim advised Monday would be ok since Saturday clinic is for acute appts. Lupita LeashDonna didn't mention pt is leaving for [place] tomorrow for 2 weeks. Pt asking if having them removed today would be ok or if there is something else she can do if Saturday clinic won't remove. Please advise.

## 2015-09-30 NOTE — Progress Notes (Signed)
Remote ICD transmission.   

## 2015-09-30 NOTE — Telephone Encounter (Signed)
Pt is schedule for tomorrow at 9:30am.

## 2015-09-30 NOTE — Telephone Encounter (Signed)
Whose in sat clinic--- they should be able to do it

## 2015-09-30 NOTE — Telephone Encounter (Signed)
Please advise      KP 

## 2015-10-01 ENCOUNTER — Ambulatory Visit (INDEPENDENT_AMBULATORY_CARE_PROVIDER_SITE_OTHER): Payer: Medicare Other | Admitting: Family Medicine

## 2015-10-01 ENCOUNTER — Encounter: Payer: Self-pay | Admitting: Family Medicine

## 2015-10-01 VITALS — BP 130/78 | HR 91 | Temp 98.4°F | Ht <= 58 in | Wt 126.2 lb

## 2015-10-01 DIAGNOSIS — S0181XD Laceration without foreign body of other part of head, subsequent encounter: Secondary | ICD-10-CM

## 2015-10-01 DIAGNOSIS — I255 Ischemic cardiomyopathy: Secondary | ICD-10-CM | POA: Diagnosis not present

## 2015-10-01 NOTE — Progress Notes (Signed)
Pre visit review using our clinic review tool, if applicable. No additional management support is needed unless otherwise documented below in the visit note. 

## 2015-10-01 NOTE — Progress Notes (Signed)
   Subjective:    Patient ID: [name], female    DOB: 05-29-1921, 82 y.o.   MRN: [id number]  HPI Here to follow up an ER visit on 09-27-15. She had tripped at home and had fallen forward, striking her face on the ground. She had a hematoma and a laceration to the forehead. Otherwise her exam that day was unremarkable and imaging of her head and neck were clear. Sutures were placed in the forehead. Today she feels fine. No headache or any other symptoms. In [place].   Review of Systems  Constitutional: Negative.   HENT: Negative.   Eyes: Negative.   Respiratory: Negative.   Cardiovascular: Negative.   Skin: Positive for wound.  Neurological: Negative.        Objective:   Physical Exam  Constitutional: She appears well-developed and well-nourished. No distress.  Walks with a cane, alert  HENT:  There is extensive ecchymosis around the face but she looks fine otherwise. There is a 1.5 cm laceration in the center of the forehead. The wound is healing nicely.   Eyes: Conjunctivae and EOM are normal.          Assessment & Plan:  Laceration. All 7 sutures were removed. She will apply Neosporin bid for a week. Recheck prn.  Nelwyn SalisburyFRY,STEPHEN A, MD

## 2015-10-04 ENCOUNTER — Encounter: Payer: Self-pay | Admitting: Cardiology

## 2015-10-14 ENCOUNTER — Telehealth: Payer: Self-pay | Admitting: Cardiology

## 2015-10-14 NOTE — Telephone Encounter (Signed)
Spoke w/ pt and requested that she send a manual transmission b/c her home monitor has not updated in at least 8 days.   

## 2015-10-25 LAB — CUP PACEART REMOTE DEVICE CHECK
Brady Statistic RA Percent Paced: 1 % — CL
Brady Statistic RV Percent Paced: 91 %
HIGH POWER IMPEDANCE MEASURED VALUE: 41 Ohm
Implantable Lead Implant Date: 20051206
Implantable Lead Implant Date: 20051206
Implantable Lead Implant Date: 20051206
Implantable Lead Location: 753859
Implantable Lead Location: 753860
Lead Channel Impedance Value: 430 Ohm
Lead Channel Impedance Value: 650 Ohm
Lead Channel Pacing Threshold Amplitude: 1.625 V
Lead Channel Sensing Intrinsic Amplitude: 2.1 mV
Lead Channel Sensing Intrinsic Amplitude: 7.5 mV
Lead Channel Setting Pacing Amplitude: 2 V
MDC IDC LEAD LOCATION: 753860
MDC IDC MSMT BATTERY REMAINING LONGEVITY: 3 mo
MDC IDC MSMT BATTERY REMAINING PERCENTAGE: 3 %
MDC IDC MSMT LEADCHNL LV IMPEDANCE VALUE: 360 Ohm
MDC IDC MSMT LEADCHNL RV PACING THRESHOLD PULSEWIDTH: 0.7 ms
MDC IDC PG SERIAL: 815822
MDC IDC SESS DTM: 20171017170805
MDC IDC SET LEADCHNL LV PACING AMPLITUDE: 2.5 V
MDC IDC SET LEADCHNL LV PACING PULSEWIDTH: 0.8 ms
MDC IDC SET LEADCHNL RV PACING AMPLITUDE: 2.5 V
MDC IDC SET LEADCHNL RV PACING PULSEWIDTH: 0.7 ms
MDC IDC SET LEADCHNL RV SENSING SENSITIVITY: 0.5 mV

## 2015-10-31 ENCOUNTER — Telehealth: Payer: Self-pay | Admitting: Cardiology

## 2015-10-31 ENCOUNTER — Ambulatory Visit (INDEPENDENT_AMBULATORY_CARE_PROVIDER_SITE_OTHER): Payer: Medicare Other | Admitting: *Deleted

## 2015-10-31 DIAGNOSIS — I255 Ischemic cardiomyopathy: Secondary | ICD-10-CM

## 2015-10-31 NOTE — Telephone Encounter (Signed)
Spoke with patient's granddaughter who says that the patient is complaining that she feels her pacemaker vibrating. Spoke with Judie GrieveBryan, Rep with [employer] who says that patient needs to make an appointment tomorrow as she is nearing needing a generator change out. I made the patient's granddaughter aware and they will make an appointment for tomorrow.

## 2015-10-31 NOTE — Telephone Encounter (Signed)
Spoke with pt and reminded pt of remote transmission that is due today. Pt verbalized understanding.   

## 2015-11-01 NOTE — Progress Notes (Signed)
Remote ICD transmission.   

## 2015-11-10 ENCOUNTER — Encounter: Payer: Self-pay | Admitting: Physician Assistant

## 2015-11-10 ENCOUNTER — Encounter: Payer: Self-pay | Admitting: *Deleted

## 2015-11-10 ENCOUNTER — Ambulatory Visit (INDEPENDENT_AMBULATORY_CARE_PROVIDER_SITE_OTHER): Payer: Medicare Other | Admitting: Physician Assistant

## 2015-11-10 VITALS — BP 142/74 | HR 72 | Ht <= 58 in | Wt 128.0 lb

## 2015-11-10 DIAGNOSIS — Z01818 Encounter for other preprocedural examination: Secondary | ICD-10-CM

## 2015-11-10 DIAGNOSIS — I255 Ischemic cardiomyopathy: Secondary | ICD-10-CM | POA: Diagnosis not present

## 2015-11-10 DIAGNOSIS — I5022 Chronic systolic (congestive) heart failure: Secondary | ICD-10-CM

## 2015-11-10 DIAGNOSIS — I10 Essential (primary) hypertension: Secondary | ICD-10-CM | POA: Diagnosis not present

## 2015-11-10 DIAGNOSIS — I2510 Atherosclerotic heart disease of native coronary artery without angina pectoris: Secondary | ICD-10-CM

## 2015-11-10 NOTE — Progress Notes (Signed)
Cardiology Office Note Date:  11/10/2015  Patient ID:  [name], DOB [birth year], MRN [id number] PCP:  Donato SchultzYvonne R Lowne Chase, DO  Electrophysiologist: Dr. Graciela HusbandsKlein    Chief Complaint: device vibrating  History of Present Illness: Tracy MontanaDorothy G [name] is a 82 y.o. female with history of CAD with remote PCI, ICM/CHF w/CRT-D, HTN, HLD, hypothyroid, GERD, comes in to the office today to be seen for Dr. Graciela HusbandsKlein.  She was last seen by him in June, at that time noting her device was approaching ERI, he discussed replacinh with ICD vs CRT-P, the daughter/patient would think about this.  Her lasix was adjusted for some SOB/edema at the time.  She reports feeling her device vibrate and was called to come in and check her battery status.  She is feeling quite well, remains active in her own life as well as her family's.  She has given much thought to the device change options and has decided given her great quality of life she would like another ICD.  She denies any CP, palpitations or SOB, no near syncope or syncope, no shocks from her device  Last LHC 10/05: RCA stent site patent, PL branch of the RCA 50%, dCFX 80% treated with Taxus DES  Nuclear study a 12/10: No scar or ischemia, EF 54%.  Repeat nuclear study 1/14 because of chest pain demonstrated no significant ischemia. Ejection fraction is measured at 35% although visualization thought to be about 55-60% with normal wall motion    DEVICE information: SJM CRT-D, implanted 12/13/04, RIATA 1500 lead in place, gen change 11/16/09, Dr. Graciela HusbandsKlein. Primary prevention, LBBB, CHF   Past Medical History:  Diagnosis Date  . Anxiety    sleep  . Arthritis   . Automatic implantable cardioverter-defibrillator in situ   . Automatic implantable cardioverter/defibrillator (AICD) activation    s/p implantable cardioverter-defibrillator bivenrticular pacer. 12/05. [employer]. remote-no.  [name]. CAD (coronary artery disease)    a. s/p prior anterior wall myocardial  infarction and multiple percutaneous coronary intervention.;  b. Aden MV 1/14:  low risk, inf and apical defect likely due to atten artifact, no ischemia, EF 35% (visually normal and est 55-60%)  . Chest pain 12/09   recent hospitalization for it. felt to be noncardiac.   [name]. CHF (congestive heart failure) (HCC)   . DD (diverticular disease)    Hx of it - severe. Left colon 2004  . Esophageal stricture    last dialated 2007  . Frequent urination at night   . GERD (gastroesophageal reflux disease)   . Heart murmur   . Hiatal hernia   . History of recurrent UTIs   . HTN (hypertension)   . Hyperlipidemia   . Hypothyroidism   . Ischemic cardiomyopathy    EF 25-30% improved to 45% with biventricular pacing.   . Other urinary problems    urinary incontenance  . Pacemaker   . PVT (paroxysmal ventricular tachycardia) (HCC)   . Shortness of breath    with exertion  . Sleep apnea    has a Cpap machine  . Systolic heart failure    improved to class I to II   . Urgency of urination   . UTI (lower urinary tract infection)    recurrent. (Klebsiella pneumoniae). Last Cx 02/03/09. (R-ancef, nitrofurantoin, Augmentin, Zyosyn)    Past Surgical History:  Procedure Laterality Date  . ANGIOPLASTY     stent  . APPENDECTOMY    . CARDIAC CATHETERIZATION     stent  .  CARDIAC DEFIBRILLATOR PLACEMENT     [employer] Atlas  . CATARACT EXTRACTION, BILATERAL    . COLONOSCOPY W/ POLYPECTOMY    . HAMMER TOE SURGERY Left 01/15/2013   Procedure: LEFT SECOND THROUGH FOURTH HAMMERTOE CORRECTION ;  Surgeon: Toni Arthurs, MD;  Location: [facility];  Service: Orthopedics;  Laterality: Left;  . HAMMERTOE RECONSTRUCTION WITH WEIL OSTEOTOMY Right 07/17/2012   Procedure: RIGHT SECOND AND THIRD MT WEIL OSTEOTOMIES AND SECOND AND THIRD HAMMERTOE CORRECTIONS;  Surgeon: Toni Arthurs, MD;  Location: [facility] OR;  Service: Orthopedics;  Laterality: Right;  . INSERT / REPLACE / REMOVE PACEMAKER    . TONSILLECTOMY    . TOTAL KNEE  ARTHROPLASTY     bilateral  . TUBAL LIGATION    . unspecified area hysterectomy    . WEIL OSTEOTOMY Left 01/15/2013   Procedure:  WEIL OSTEOTOMY AND DORSAL CAPSULOTOMY;  Surgeon: Toni Arthurs, MD;  Location: [facility];  Service: Orthopedics;  Laterality: Left;    Current Outpatient Prescriptions  Medication Sig Dispense Refill  . albuterol (PROVENTIL HFA;VENTOLIN HFA) 108 (90 Base) MCG/ACT inhaler Inhale 2 puffs into the lungs every 6 (six) hours as needed for wheezing or shortness of breath. 1 Inhaler 2  . aspirin (ASPIR-81) 81 MG EC tablet Take 81 mg by mouth daily.      . beclomethasone (QVAR) 40 MCG/ACT inhaler Inhale 2 puffs into the lungs 2 (two) times daily. 1 Inhaler 3  . diphenhydrAMINE (BENADRYL) 25 mg capsule Take 25 mg by mouth every 6 (six) hours as needed for itching or allergies.    . furosemide (LASIX) 20 MG tablet Take 1 tablet (20 mg total) by mouth daily as needed for fluid or edema. 90 tablet 3  . isosorbide mononitrate (IMDUR) 30 MG 24 hr tablet TAKE ONE & ONE-HALF TABLETS BY MOUTH IN THE MORNING 45 tablet 5  . levothyroxine (SYNTHROID, LEVOTHROID) 100 MCG tablet Take 1 tablet (100 mcg total) by mouth daily before breakfast. 30 tablet 1  . levothyroxine (SYNTHROID, LEVOTHROID) 112 MCG tablet Take 112 mcg by mouth daily before breakfast.    . losartan (COZAAR) 50 MG tablet TAKE ONE TABLET BY MOUTH ONCE DAILY 90 tablet 1  . metoprolol succinate (TOPROL-XL) 50 MG 24 hr tablet TAKE ONE TABLET BY MOUTH TWICE DAILY WITH  OR  IMMEDIATELY  FOLLOWING  A  MEAL 60 tablet 5  . nitroGLYCERIN (NITROSTAT) 0.4 MG SL tablet Place 1 tablet (0.4 mg total) under the tongue every 5 (five) minutes as needed. At the onset of chest pain, Up to 3 doses 10 tablet 0  . NON FORMULARY C-Pap machine, uses nightly for sleep apnea    . pravastatin (PRAVACHOL) 20 MG tablet TAKE ONE TABLET BY MOUTH ONCE DAILY 90 tablet 0  . pravastatin (PRAVACHOL) 20 MG tablet TAKE ONE TABLET BY MOUTH ONCE DAILY 90 tablet 0  .  Psyllium (METAFIBER) 48.57 % POWD Take 10 mLs by mouth every evening.      No current facility-administered medications for this visit.     Allergies:   Sulfonamide derivatives   Social History:  The patient  reports that she has never smoked. She has never used smokeless tobacco. She reports that she does not drink alcohol or use drugs.   Family History:  The patient's family history includes Alcohol abuse in her son; Brain cancer in her son; Breast cancer in her daughter; Colon polyps in her sister; Diabetes in her daughter; Heart disease in her brother, father, mother, and sister; Lung  cancer in her son; Prostate cancer in her brother.  ROS:  Please see the history of present illness.  All other systems are reviewed and otherwise negative.   PHYSICAL EXAM:  VS:  BP (!) 142/74   Pulse 72   Ht 4\' 10"  (1.473 m)   Wt 128 lb (58.1 kg)   BMI 26.75 kg/m  BMI: Body mass index is 26.75 kg/m. Well nourished, well developed, in no acute distress  HEENT: normocephalic, atraumatic  Neck: no JVD, carotid bruits or masses Cardiac: RRR; no significant murmurs, no rubs, or gallops Lungs:  clear to auscultation bilaterally, no wheezing, rhonchi or rales  Abd: soft, nontender MS: no deformity or atrophy Ext: no edema  Skin: warm and dry, no rash Neuro:  No gross deficits appreciated Psych: euthymic mood, full affect  ICD site is stable, no tethering or discomfort   EKG:  Done today and reviewed by myself shows SR, V paced ICD interrogation today and reviewed by myself: Battery reached ERI 10/30/15, leads are stable, only 91% BiVe pacing, no VT, she has AMS that appears 1:1 tachycardia by available EGMs   Recent Labs: 09/19/2015: ALT 11; BUN 18; Creatinine, Ser 0.66; Hemoglobin 12.2; Platelets 205.0; Potassium 5.0; Sodium 136; TSH 0.12  09/19/2015: Cholesterol 115; HDL 37.50; LDL Cholesterol 50; Total CHOL/HDL Ratio 3; Triglycerides 137.0; VLDL 27.4   CrCl cannot be calculated (Patient's  most recent lab result is older than the maximum 21 days allowed.).   Wt Readings from Last 3 Encounters:  11/10/15 128 lb (58.1 kg)  10/01/15 126 lb 4 oz (57.3 kg)  09/27/15 130 lb (59 kg)     Other studies reviewed: Additional studies/records reviewed today include: summarized above  ASSESSMENT AND PLAN:  1. CRT-D     has reached ERI     risks benefits of gen change discussed with the patient and ger granddaughter (in-law) who accompanies her to all of her appointments and helps care where needed     patient family wishes to have another ICD rather then down-grade to pacer  2. ICM/CHF     euvolemic     On good meds, rarely uses PRN lasix  3. CAD     stable without symptoms     On ASA, BB, statin   4. HTN     stable  Disposition: will schedule her for ICD gen change and routine f/u afterwards.  Current medicines are reviewed at length with the patient today.  The patient did not have any concerns regarding medicines.  Judith BlonderSigned, Tameisha Covell Ursy, PA-C 11/10/2015 12:04 PM     [facility] [address] [place] [address] [phone] (office)  [phone]) [phone] (fax)

## 2015-11-10 NOTE — Patient Instructions (Addendum)
Medication Instructions:   Your physician recommends that you continue on your current medications as directed. Please refer to the Current Medication list given to you today.   If you need a refill on your cardiac medications before your next appointment, please call your pharmacy.  Labwork: RETURN LABS  FOR CBC AND BMET ON 11/28/15   Testing/Procedures:  SEE LETTER FOR GEN CHANGE INSTRUCTIONS ON 12/05/15   Follow-Up:  10 DAYS  POST  WOUND CHECK AFTER 12/05/15  90 DAY PHYS DEFIB CHK AFTER 12/05/15 WITH DR Graciela HusbandsKLEIN    Any Other Special Instructions Will Be Listed Below (If Applicable).

## 2015-11-14 ENCOUNTER — Telehealth: Payer: Self-pay | Admitting: Pulmonary Disease

## 2015-11-14 NOTE — Telephone Encounter (Signed)
LMOM - We will call [facility] in am due to their office being closed - Pt has not received cpap yet

## 2015-11-15 LAB — CUP PACEART INCLINIC DEVICE CHECK
Battery Remaining Longevity: 0 mo
HighPow Impedance: 40.3418
Implantable Lead Implant Date: 20051206
Implantable Lead Location: 753860
Implantable Lead Model: 1581
Lead Channel Impedance Value: 362.5 Ohm
Lead Channel Impedance Value: 425 Ohm
Lead Channel Pacing Threshold Amplitude: 1 V
Lead Channel Pacing Threshold Pulse Width: 0.8 ms
Lead Channel Sensing Intrinsic Amplitude: 2.4 mV
Lead Channel Setting Pacing Amplitude: 2.5 V
MDC IDC LEAD IMPLANT DT: 20051206
MDC IDC LEAD IMPLANT DT: 20051206
MDC IDC LEAD LOCATION: 753859
MDC IDC LEAD LOCATION: 753860
MDC IDC MSMT LEADCHNL LV PACING THRESHOLD AMPLITUDE: 1.75 V
MDC IDC MSMT LEADCHNL RA PACING THRESHOLD PULSEWIDTH: 0.5 ms
MDC IDC MSMT LEADCHNL RV IMPEDANCE VALUE: 650 Ohm
MDC IDC MSMT LEADCHNL RV PACING THRESHOLD AMPLITUDE: 1.75 V
MDC IDC MSMT LEADCHNL RV PACING THRESHOLD PULSEWIDTH: 0.7 ms
MDC IDC MSMT LEADCHNL RV SENSING INTR AMPL: 6.1 mV
MDC IDC PG IMPLANT DT: 20111109
MDC IDC PG SERIAL: 815822
MDC IDC SESS DTM: 20171102144633
MDC IDC SET LEADCHNL LV PACING PULSEWIDTH: 0.8 ms
MDC IDC SET LEADCHNL RA PACING AMPLITUDE: 2 V
MDC IDC SET LEADCHNL RV PACING AMPLITUDE: 2.75 V
MDC IDC SET LEADCHNL RV PACING PULSEWIDTH: 0.7 ms
MDC IDC SET LEADCHNL RV SENSING SENSITIVITY: 0.5 mV
MDC IDC STAT BRADY RA PERCENT PACED: 0.69 %
MDC IDC STAT BRADY RV PERCENT PACED: 91 %

## 2015-11-15 NOTE — Telephone Encounter (Signed)
Called [facility] and spoke with Marylene Landngela, states the patient had received a new cpap in 2016, so she will not be able to receive a new cpap machine from any DME company d/t medicare guidelines.   Marylene Landngela states that they will be reaching out to patient to get supplies to her.  Spoke with pt's granddaughter Lupita LeashDonna (dpr on file), aware of above.  Nothing further needed at this time.

## 2015-11-21 ENCOUNTER — Other Ambulatory Visit: Payer: Medicare Other

## 2015-11-23 ENCOUNTER — Other Ambulatory Visit (INDEPENDENT_AMBULATORY_CARE_PROVIDER_SITE_OTHER): Payer: Medicare Other

## 2015-11-23 DIAGNOSIS — I5022 Chronic systolic (congestive) heart failure: Secondary | ICD-10-CM | POA: Diagnosis not present

## 2015-11-23 DIAGNOSIS — Z01818 Encounter for other preprocedural examination: Secondary | ICD-10-CM

## 2015-11-23 LAB — CBC
HCT: 41.9 % (ref 35.0–45.0)
Hemoglobin: 13.8 g/dL (ref 11.7–15.5)
MCH: 30.4 pg (ref 27.0–33.0)
MCHC: 32.9 g/dL (ref 32.0–36.0)
MCV: 92.3 fL (ref 80.0–100.0)
MPV: 9.7 fL (ref 7.5–12.5)
PLATELETS: 190 10*3/uL (ref 140–400)
RBC: 4.54 MIL/uL (ref 3.80–5.10)
RDW: 13.6 % (ref 11.0–15.0)
WBC: 6.4 10*3/uL (ref 3.8–10.8)

## 2015-11-23 LAB — BASIC METABOLIC PANEL
BUN: 13 mg/dL (ref 7–25)
CALCIUM: 10 mg/dL (ref 8.6–10.4)
CO2: 32 mmol/L — ABNORMAL HIGH (ref 20–31)
CREATININE: 0.62 mg/dL (ref 0.60–0.88)
Chloride: 101 mmol/L (ref 98–110)
Glucose, Bld: 101 mg/dL — ABNORMAL HIGH (ref 65–99)
Potassium: 4.5 mmol/L (ref 3.5–5.3)
Sodium: 138 mmol/L (ref 135–146)

## 2015-11-25 ENCOUNTER — Telehealth: Payer: Self-pay | Admitting: Internal Medicine

## 2015-11-25 NOTE — Telephone Encounter (Signed)
I spoke with the patient's grand-daughter and advised her that the patient does not need to come for labs with us on Monday.  Her BMP/CBC were drawn on 11/23/15- I believe this was done at Dr. Ernst SpellLowne's office. I advised that the patient is fine on our end and ready for her gen change. However, it seems as though Dr. Ernst SpellLowne's office may have pulled our orders for her BMP/ CBC and did not draw the TSH that Dr. Laury AxonLowne ordered. I advised they call and see if the TSH can be drawn of the blood stored from earlier this week. The patient's grand-daughter verbalizes understanding.

## 2015-11-25 NOTE — Telephone Encounter (Signed)
New Message:    Should pt fast for her lab work on Monday?

## 2015-11-28 ENCOUNTER — Other Ambulatory Visit: Payer: Self-pay

## 2015-12-01 LAB — CUP PACEART REMOTE DEVICE CHECK
Brady Statistic AP VS Percent: 1 %
Brady Statistic AS VS Percent: 5.6 %
Brady Statistic RA Percent Paced: 1 %
Date Time Interrogation Session: 20171024003604
HIGH POWER IMPEDANCE MEASURED VALUE: 39 Ohm
Implantable Lead Implant Date: 20051206
Implantable Lead Location: 753860
Implantable Pulse Generator Implant Date: 20111109
Lead Channel Impedance Value: 410 Ohm
Lead Channel Pacing Threshold Amplitude: 1.5 V
Lead Channel Pacing Threshold Amplitude: 1.5 V
Lead Channel Pacing Threshold Pulse Width: 0.7 ms
Lead Channel Pacing Threshold Pulse Width: 0.8 ms
Lead Channel Setting Pacing Amplitude: 2 V
Lead Channel Setting Pacing Pulse Width: 0.7 ms
Lead Channel Setting Sensing Sensitivity: 0.5 mV
MDC IDC LEAD IMPLANT DT: 20051206
MDC IDC LEAD IMPLANT DT: 20051206
MDC IDC LEAD LOCATION: 753859
MDC IDC LEAD LOCATION: 753860
MDC IDC MSMT BATTERY REMAINING LONGEVITY: 0 mo
MDC IDC MSMT BATTERY VOLTAGE: 2.59 V
MDC IDC MSMT LEADCHNL LV IMPEDANCE VALUE: 380 Ohm
MDC IDC MSMT LEADCHNL RA PACING THRESHOLD AMPLITUDE: 0.75 V
MDC IDC MSMT LEADCHNL RA PACING THRESHOLD PULSEWIDTH: 0.5 ms
MDC IDC MSMT LEADCHNL RA SENSING INTR AMPL: 1.7 mV
MDC IDC MSMT LEADCHNL RV IMPEDANCE VALUE: 600 Ohm
MDC IDC MSMT LEADCHNL RV SENSING INTR AMPL: 8.2 mV
MDC IDC SET LEADCHNL LV PACING AMPLITUDE: 2.5 V
MDC IDC SET LEADCHNL LV PACING PULSEWIDTH: 0.8 ms
MDC IDC SET LEADCHNL RV PACING AMPLITUDE: 2.5 V
MDC IDC STAT BRADY AP VP PERCENT: 1.4 %
MDC IDC STAT BRADY AS VP PERCENT: 90 %
Pulse Gen Serial Number: 815822

## 2015-12-02 ENCOUNTER — Encounter (HOSPITAL_COMMUNITY): Payer: Self-pay | Admitting: Nurse Practitioner

## 2015-12-05 ENCOUNTER — Encounter (HOSPITAL_COMMUNITY)
Admission: RE | Disposition: A | Discharge status: Home / Self Care | Payer: Self-pay | Source: Ambulatory Visit | Attending: Internal Medicine

## 2015-12-05 ENCOUNTER — Ambulatory Visit (HOSPITAL_COMMUNITY)
Admission: RE | Admit: 2015-12-05 | Discharge: 2015-12-05 | Disposition: A | Discharge status: Home / Self Care | Payer: Medicare Other | Source: Ambulatory Visit | Attending: Internal Medicine | Admitting: Internal Medicine

## 2015-12-05 ENCOUNTER — Encounter (HOSPITAL_COMMUNITY): Payer: Self-pay | Admitting: Cardiology

## 2015-12-05 DIAGNOSIS — Z4509 Encounter for adjustment and management of other cardiac device: Secondary | ICD-10-CM | POA: Diagnosis not present

## 2015-12-05 DIAGNOSIS — Z96653 Presence of artificial knee joint, bilateral: Secondary | ICD-10-CM | POA: Insufficient documentation

## 2015-12-05 DIAGNOSIS — Z4502 Encounter for adjustment and management of automatic implantable cardiac defibrillator: Secondary | ICD-10-CM | POA: Insufficient documentation

## 2015-12-05 DIAGNOSIS — G4730 Sleep apnea, unspecified: Secondary | ICD-10-CM | POA: Insufficient documentation

## 2015-12-05 DIAGNOSIS — K219 Gastro-esophageal reflux disease without esophagitis: Secondary | ICD-10-CM | POA: Diagnosis not present

## 2015-12-05 DIAGNOSIS — Z8249 Family history of ischemic heart disease and other diseases of the circulatory system: Secondary | ICD-10-CM | POA: Insufficient documentation

## 2015-12-05 DIAGNOSIS — I5022 Chronic systolic (congestive) heart failure: Secondary | ICD-10-CM | POA: Insufficient documentation

## 2015-12-05 DIAGNOSIS — I429 Cardiomyopathy, unspecified: Secondary | ICD-10-CM | POA: Diagnosis not present

## 2015-12-05 DIAGNOSIS — I110 Hypertensive heart disease with heart failure: Secondary | ICD-10-CM | POA: Diagnosis not present

## 2015-12-05 DIAGNOSIS — I2510 Atherosclerotic heart disease of native coronary artery without angina pectoris: Secondary | ICD-10-CM | POA: Insufficient documentation

## 2015-12-05 DIAGNOSIS — I252 Old myocardial infarction: Secondary | ICD-10-CM | POA: Insufficient documentation

## 2015-12-05 DIAGNOSIS — Z79899 Other long term (current) drug therapy: Secondary | ICD-10-CM | POA: Insufficient documentation

## 2015-12-05 DIAGNOSIS — I255 Ischemic cardiomyopathy: Secondary | ICD-10-CM | POA: Insufficient documentation

## 2015-12-05 DIAGNOSIS — E785 Hyperlipidemia, unspecified: Secondary | ICD-10-CM | POA: Insufficient documentation

## 2015-12-05 DIAGNOSIS — Z006 Encounter for examination for normal comparison and control in clinical research program: Secondary | ICD-10-CM | POA: Insufficient documentation

## 2015-12-05 DIAGNOSIS — I472 Ventricular tachycardia: Secondary | ICD-10-CM | POA: Diagnosis not present

## 2015-12-05 DIAGNOSIS — Z955 Presence of coronary angioplasty implant and graft: Secondary | ICD-10-CM | POA: Insufficient documentation

## 2015-12-05 DIAGNOSIS — E039 Hypothyroidism, unspecified: Secondary | ICD-10-CM | POA: Diagnosis not present

## 2015-12-05 DIAGNOSIS — Z7982 Long term (current) use of aspirin: Secondary | ICD-10-CM | POA: Diagnosis not present

## 2015-12-05 HISTORY — PX: EP IMPLANTABLE DEVICE: SHX172B

## 2015-12-05 LAB — SURGICAL PCR SCREEN
MRSA, PCR: NEGATIVE
STAPHYLOCOCCUS AUREUS: NEGATIVE

## 2015-12-05 SURGERY — ICD/BIV ICD GENERATOR CHANGEOUT

## 2015-12-05 MED ORDER — SODIUM CHLORIDE 0.9 % IR SOLN
80.0000 mg | Status: AC
  Administered 2015-12-05: 80 mg

## 2015-12-05 MED ORDER — FENTANYL CITRATE (PF) 100 MCG/2ML IJ SOLN
INTRAMUSCULAR | Status: AC
  Filled 2015-12-05: qty 2

## 2015-12-05 MED ORDER — SODIUM CHLORIDE 0.9 % IV SOLN: INTRAVENOUS | Status: AC

## 2015-12-05 MED ORDER — MUPIROCIN 2 % EX OINT
TOPICAL_OINTMENT | CUTANEOUS | Status: AC
  Administered 2015-12-05: 1 via TOPICAL
  Filled 2015-12-05: qty 22

## 2015-12-05 MED ORDER — LIDOCAINE HCL (PF) 1 % IJ SOLN
INTRAMUSCULAR | Status: DC | PRN
  Administered 2015-12-05: 45 mL via INTRADERMAL

## 2015-12-05 MED ORDER — ACETAMINOPHEN 325 MG PO TABS
325.0000 mg | ORAL_TABLET | ORAL | Status: DC | PRN
  Filled 2015-12-05: qty 2

## 2015-12-05 MED ORDER — MIDAZOLAM HCL 5 MG/5ML IJ SOLN
INTRAMUSCULAR | Status: DC | PRN
  Administered 2015-12-05: 1 mg via INTRAVENOUS

## 2015-12-05 MED ORDER — MUPIROCIN 2 % EX OINT
1.0000 "application " | TOPICAL_OINTMENT | Freq: Once | CUTANEOUS | Status: AC
  Administered 2015-12-05: 1 via TOPICAL
  Filled 2015-12-05: qty 22

## 2015-12-05 MED ORDER — ONDANSETRON HCL 4 MG/2ML IJ SOLN: 4.0000 mg | Freq: Four times a day (QID) | INTRAMUSCULAR | Status: DC | PRN

## 2015-12-05 MED ORDER — CEFAZOLIN SODIUM-DEXTROSE 2-4 GM/100ML-% IV SOLN
INTRAVENOUS | Status: AC
  Filled 2015-12-05: qty 100

## 2015-12-05 MED ORDER — SODIUM CHLORIDE 0.9 % IR SOLN
Status: AC
  Filled 2015-12-05: qty 2

## 2015-12-05 MED ORDER — CHLORHEXIDINE GLUCONATE 4 % EX LIQD: 60.0000 mL | Freq: Once | CUTANEOUS | Status: DC

## 2015-12-05 MED ORDER — CEFAZOLIN SODIUM-DEXTROSE 2-4 GM/100ML-% IV SOLN
2.0000 g | INTRAVENOUS | Status: AC
  Administered 2015-12-05: 2 g via INTRAVENOUS

## 2015-12-05 MED ORDER — LIDOCAINE HCL (PF) 1 % IJ SOLN
INTRAMUSCULAR | Status: AC
  Filled 2015-12-05: qty 60

## 2015-12-05 MED ORDER — MIDAZOLAM HCL 5 MG/5ML IJ SOLN
INTRAMUSCULAR | Status: AC
  Filled 2015-12-05: qty 5

## 2015-12-05 MED ORDER — SODIUM CHLORIDE 0.9 % IV SOLN
INTRAVENOUS | Status: DC
  Administered 2015-12-05: 10:00:00 via INTRAVENOUS

## 2015-12-05 SURGICAL SUPPLY — 6 items
CABLE SURGICAL S-101-97-12 (CABLE) ×2 IMPLANT
HEMOSTAT SURGICEL 2X4 FIBR (HEMOSTASIS) ×2 IMPLANT
PACEMAKER ALLR CRT-P RF PM3222 (Pacemaker) IMPLANT
PAD DEFIB LIFELINK (PAD) ×2 IMPLANT
PPM ALLURE CRT-P RF PM3222 (Pacemaker) ×3 IMPLANT
TRAY PACEMAKER INSERTION (PACKS) ×2 IMPLANT

## 2015-12-05 NOTE — Interval H&P Note (Signed)
History and Physical Interval Note:  12/05/2015 11:33 AM  [name]  has presented today for surgery, with the diagnosis of eri  The various methods of treatment have been discussed with the patient and family. After consideration of risks, benefits and other options for treatment, the patient has consented to  Procedure(s): ICD QUALCOMMenerator Changeout (N/A) as a surgical intervention .  The patient's history has been reviewed, patient examined, no change in status, stable for surgery.  I have reviewed the patient's chart and labs.  Questions were answered to the patient's satisfaction.     Sherryl MangesSteven Klein

## 2015-12-05 NOTE — Discharge Instructions (Signed)
Pacemaker Battery Change, Care After °Refer to this sheet in the next few weeks. These instructions provide you with information on caring for yourself after your procedure. Your health care provider may also give you more specific instructions. Your treatment has been planned according to current medical practices, but problems sometimes occur. Call your health care provider if you have any problems or questions after your procedure. °WHAT TO EXPECT AFTER THE PROCEDURE °After your procedure, it is typical to have the following sensations: °· Soreness at the pacemaker site. °HOME CARE INSTRUCTIONS  °· Keep the incision clean and dry. °· Unless advised otherwise, you may shower beginning 48 hours after your procedure. °· For the first week after the replacement, avoid stretching motions that pull at the incision site, and avoid heavy exercise with the arm that is on the same side as the incision. °· Take medicines only as directed by your health care provider. °· Keep all follow-up visits as directed by your health care provider. °SEEK MEDICAL CARE IF:  °· You have pain at the incision site that is not relieved by over-the-counter or prescription medicine. °· There is drainage or pus from the incision site. °· There is swelling larger than a lime at the incision site. °· You develop red streaking that extends above or below the incision site. °· You feel brief, intermittent palpitations, light-headedness, or any symptoms that you feel might be related to your heart. °SEEK IMMEDIATE MEDICAL CARE IF:  °· You experience chest pain that is different than the pain at the pacemaker site. °· You experience shortness of breath. °· You have palpitations or irregular heartbeat. °· You have light-headedness that does not go away quickly. °· You faint. °· You have pain that gets worse and is not relieved by medicine. °This information is not intended to replace advice given to you by your health care provider. Make sure you  discuss any questions you have with your health care provider. °Document Released: 10/15/2012 Document Revised: 01/15/2014 Document Reviewed: 10/15/2012 °Elsevier Interactive Patient Education © 2017 [employer] ° °

## 2015-12-05 NOTE — H&P (View-Only) (Signed)
 Cardiology Office Note Date:  11/10/2015  Patient ID:  [name], DOB 11/08/1921, MRN [id number] PCP:  Yvonne R Lowne Chase, DO  Electrophysiologist: Dr. Klein    Chief Complaint: device vibrating  History of Present Illness: [name] is a 82 y.o. female with history of CAD with remote PCI, ICM/CHF w/CRT-D, HTN, HLD, hypothyroid, GERD, comes in to the office today to be seen for Dr. Klein.  She was last seen by him in June, at that time noting her device was approaching ERI, he discussed replacinh with ICD vs CRT-P, the daughter/patient would think about this.  Her lasix was adjusted for some SOB/edema at the time.  She reports feeling her device vibrate and was called to come in and check her battery status.  She is feeling quite well, remains active in her own life as well as her family's.  She has given much thought to the device change options and has decided given her great quality of life she would like another ICD.  She denies any CP, palpitations or SOB, no near syncope or syncope, no shocks from her device  Last LHC 10/05: RCA stent site patent, PL branch of the RCA 50%, dCFX 80% treated with Taxus DES  Nuclear study a 12/10: No scar or ischemia, EF 54%.  Repeat nuclear study 1/14 because of chest pain demonstrated no significant ischemia. Ejection fraction is measured at 35% although visualization thought to be about 55-60% with normal wall motion    DEVICE information: SJM CRT-D, implanted 12/13/04, RIATA 1500 lead in place, gen change 11/16/09, Dr. Klein. Primary prevention, LBBB, CHF   Past Medical History:  Diagnosis Date  . Anxiety    sleep  . Arthritis   . Automatic implantable cardioverter-defibrillator in situ   . Automatic implantable cardioverter/defibrillator (AICD) activation    s/p implantable cardioverter-defibrillator bivenrticular pacer. 12/05. [employer] Atlas. remote-no.  . CAD (coronary artery disease)    a. s/p prior anterior wall myocardial  infarction and multiple percutaneous coronary intervention.;  b. Aden MV 1/14:  low risk, inf and apical defect likely due to atten artifact, no ischemia, EF 35% (visually normal and est 55-60%)  . Chest pain 12/09   recent hospitalization for it. felt to be noncardiac.   . CHF (congestive heart failure) (HCC)   . DD (diverticular disease)    Hx of it - severe. Left colon 2004  . Esophageal stricture    last dialated 2007  . Frequent urination at night   . GERD (gastroesophageal reflux disease)   . Heart murmur   . Hiatal hernia   . History of recurrent UTIs   . HTN (hypertension)   . Hyperlipidemia   . Hypothyroidism   . Ischemic cardiomyopathy    EF 25-30% improved to 45% with biventricular pacing.   . Other urinary problems    urinary incontenance  . Pacemaker   . PVT (paroxysmal ventricular tachycardia) (HCC)   . Shortness of breath    with exertion  . Sleep apnea    has a Cpap machine  . Systolic heart failure    improved to class I to II   . Urgency of urination   . UTI (lower urinary tract infection)    recurrent. (Klebsiella pneumoniae). Last Cx 02/03/09. (R-ancef, nitrofurantoin, Augmentin, Zyosyn)    Past Surgical History:  Procedure Laterality Date  . ANGIOPLASTY     stent  . APPENDECTOMY    . CARDIAC CATHETERIZATION     stent  .   CARDIAC DEFIBRILLATOR PLACEMENT     [employer] Jude Atlas  . CATARACT EXTRACTION, BILATERAL    . COLONOSCOPY W/ POLYPECTOMY    . HAMMER TOE SURGERY Left 01/15/2013   Procedure: LEFT SECOND THROUGH FOURTH HAMMERTOE CORRECTION ;  Surgeon: John Hewitt, MD;  Location: [facility];  Service: Orthopedics;  Laterality: Left;  . HAMMERTOE RECONSTRUCTION WITH WEIL OSTEOTOMY Right 07/17/2012   Procedure: RIGHT SECOND AND THIRD MT WEIL OSTEOTOMIES AND SECOND AND THIRD HAMMERTOE CORRECTIONS;  Surgeon: John Hewitt, MD;  Location: [facility];  Service: Orthopedics;  Laterality: Right;  . INSERT / REPLACE / REMOVE PACEMAKER    . TONSILLECTOMY    . TOTAL KNEE  ARTHROPLASTY     bilateral  . TUBAL LIGATION    . unspecified area hysterectomy    . WEIL OSTEOTOMY Left 01/15/2013   Procedure:  WEIL OSTEOTOMY AND DORSAL CAPSULOTOMY;  Surgeon: John Hewitt, MD;  Location: [facility];  Service: Orthopedics;  Laterality: Left;    Current Outpatient Prescriptions  Medication Sig Dispense Refill  . albuterol (PROVENTIL HFA;VENTOLIN HFA) 108 (90 Base) MCG/ACT inhaler Inhale 2 puffs into the lungs every 6 (six) hours as needed for wheezing or shortness of breath. 1 Inhaler 2  . aspirin (ASPIR-81) 81 MG EC tablet Take 81 mg by mouth daily.      . beclomethasone (QVAR) 40 MCG/ACT inhaler Inhale 2 puffs into the lungs 2 (two) times daily. 1 Inhaler 3  . diphenhydrAMINE (BENADRYL) 25 mg capsule Take 25 mg by mouth every 6 (six) hours as needed for itching or allergies.    . furosemide (LASIX) 20 MG tablet Take 1 tablet (20 mg total) by mouth daily as needed for fluid or edema. 90 tablet 3  . isosorbide mononitrate (IMDUR) 30 MG 24 hr tablet TAKE ONE & ONE-HALF TABLETS BY MOUTH IN THE MORNING 45 tablet 5  . levothyroxine (SYNTHROID, LEVOTHROID) 100 MCG tablet Take 1 tablet (100 mcg total) by mouth daily before breakfast. 30 tablet 1  . levothyroxine (SYNTHROID, LEVOTHROID) 112 MCG tablet Take 112 mcg by mouth daily before breakfast.    . losartan (COZAAR) 50 MG tablet TAKE ONE TABLET BY MOUTH ONCE DAILY 90 tablet 1  . metoprolol succinate (TOPROL-XL) 50 MG 24 hr tablet TAKE ONE TABLET BY MOUTH TWICE DAILY WITH  OR  IMMEDIATELY  FOLLOWING  A  MEAL 60 tablet 5  . nitroGLYCERIN (NITROSTAT) 0.4 MG SL tablet Place 1 tablet (0.4 mg total) under the tongue every 5 (five) minutes as needed. At the onset of chest pain, Up to 3 doses 10 tablet 0  . NON FORMULARY C-Pap machine, uses nightly for sleep apnea    . pravastatin (PRAVACHOL) 20 MG tablet TAKE ONE TABLET BY MOUTH ONCE DAILY 90 tablet 0  . pravastatin (PRAVACHOL) 20 MG tablet TAKE ONE TABLET BY MOUTH ONCE DAILY 90 tablet 0  .  Psyllium (METAFIBER) 48.57 % POWD Take 10 mLs by mouth every evening.      No current facility-administered medications for this visit.     Allergies:   Sulfonamide derivatives   Social History:  The patient  reports that she has never smoked. She has never used smokeless tobacco. She reports that she does not drink alcohol or use drugs.   Family History:  The patient's family history includes Alcohol abuse in her son; Brain cancer in her son; Breast cancer in her daughter; Colon polyps in her sister; Diabetes in her daughter; Heart disease in her brother, father, mother, and sister; Lung   cancer in her son; Prostate cancer in her brother.  ROS:  Please see the history of present illness.  All other systems are reviewed and otherwise negative.   PHYSICAL EXAM:  VS:  BP (!) 142/74   Pulse 72   Ht 4' 10" (1.473 m)   Wt 128 lb (58.1 kg)   BMI 26.75 kg/m  BMI: Body mass index is 26.75 kg/m. Well nourished, well developed, in no acute distress  HEENT: normocephalic, atraumatic  Neck: no JVD, carotid bruits or masses Cardiac: RRR; no significant murmurs, no rubs, or gallops Lungs:  clear to auscultation bilaterally, no wheezing, rhonchi or rales  Abd: soft, nontender MS: no deformity or atrophy Ext: no edema  Skin: warm and dry, no rash Neuro:  No gross deficits appreciated Psych: euthymic mood, full affect  ICD site is stable, no tethering or discomfort   EKG:  Done today and reviewed by myself shows SR, V paced ICD interrogation today and reviewed by myself: Battery reached ERI 10/30/15, leads are stable, only 91% BiVe pacing, no VT, she has AMS that appears 1:1 tachycardia by available EGMs   Recent Labs: 09/19/2015: ALT 11; BUN 18; Creatinine, Ser 0.66; Hemoglobin 12.2; Platelets 205.0; Potassium 5.0; Sodium 136; TSH 0.12  09/19/2015: Cholesterol 115; HDL 37.50; LDL Cholesterol 50; Total CHOL/HDL Ratio 3; Triglycerides 137.0; VLDL 27.4   CrCl cannot be calculated (Patient's  most recent lab result is older than the maximum 21 days allowed.).   Wt Readings from Last 3 Encounters:  11/10/15 128 lb (58.1 kg)  10/01/15 126 lb 4 oz (57.3 kg)  09/27/15 130 lb (59 kg)     Other studies reviewed: Additional studies/records reviewed today include: summarized above  ASSESSMENT AND PLAN:  1. CRT-D     has reached ERI     risks benefits of gen change discussed with the patient and ger granddaughter (in-law) who accompanies her to all of her appointments and helps care where needed     patient family wishes to have another ICD rather then down-grade to pacer  2. ICM/CHF     euvolemic     On good meds, rarely uses PRN lasix  3. CAD     stable without symptoms     On ASA, BB, statin   4. HTN     stable  Disposition: will schedule her for ICD gen change and routine f/u afterwards.  Current medicines are reviewed at length with the patient today.  The patient did not have any concerns regarding medicines.  Signed, Renee Ursy, PA-C 11/10/2015 12:04 PM     [facility] [address] [place] [address] [phone] (office)  [phone] (fax)   

## 2015-12-06 ENCOUNTER — Encounter (HOSPITAL_COMMUNITY): Payer: Self-pay | Admitting: Internal Medicine

## 2015-12-06 MED FILL — Fentanyl Citrate Preservative Free (PF) Inj 100 MCG/2ML: INTRAMUSCULAR | Qty: 2 | Status: AC

## 2015-12-08 ENCOUNTER — Encounter: Payer: Self-pay | Admitting: Internal Medicine

## 2015-12-08 NOTE — Progress Notes (Unsigned)
ICD Criteria  ICD DEVICE EXPLANTED   Current LVEF:55%. Within 12 months prior to implant: Yes   Heart failure history: Yes, Class II  Cardiomyopathy history: Yes, Non-Ischemic Cardiomyopathy.  Atrial Fibrillation/Atrial Flutter: Yes, Paroxysmal.  Ventricular tachycardia history: No.  Cardiac arrest history: No.  History of syndromes with risk of sudden death: No.  Previous ICD: Yes, Reason for ICD:  Primary prevention.  Current ICD indication: none  DEVICE EXPLANTED   PPM indication: No.   Class I or II Bradycardia indication present: Yes  Beta Blocker therapy for 3 or more months: Yes, prescribed.   Ace Inhibitor/ARB therapy for 3 or more months: Yes, prescribed.

## 2015-12-15 ENCOUNTER — Ambulatory Visit (INDEPENDENT_AMBULATORY_CARE_PROVIDER_SITE_OTHER): Payer: Medicare Other | Admitting: *Deleted

## 2015-12-15 ENCOUNTER — Other Ambulatory Visit: Payer: Self-pay | Admitting: Family Medicine

## 2015-12-15 DIAGNOSIS — I255 Ischemic cardiomyopathy: Secondary | ICD-10-CM

## 2015-12-15 NOTE — Progress Notes (Signed)
Wound check appointment. Dermabond removed. Wound without redness or edema. Incision edges approximated, wound well healed. Normal device function. Thresholds, sensing, and impedances consistent with implant measurements. Device programmed at chronic values s/p gen change. Histogram distribution appropriate for patient and level of activity. No mode switches or high ventricular rates noted. Patient educated about wound care, arm mobility, lifting restrictions. ROV with SK 2/27

## 2016-01-06 ENCOUNTER — Telehealth: Payer: Self-pay | Admitting: Pulmonary Disease

## 2016-01-06 NOTE — Telephone Encounter (Signed)
Spoke with Lupita LeashDonna, pt's granddaughter, Lupita LeashDonna  She states that someone from [facility] told her that she was going to bring her a new, smaller CPAP mask  She has not heard anything in a couple of wks I called [facility] and spoke with Selena BattenKim She states they had to order the smaller mask, and that Marylene Landngela with [facility] will be reaching out to the pt to discuss this with her  I spoke with Lupita LeashDonna and notified her of this  Nothing further needed

## 2016-01-24 ENCOUNTER — Telehealth: Payer: Self-pay | Admitting: Internal Medicine

## 2016-01-24 NOTE — Telephone Encounter (Addendum)
Returned call to Ms. [name] and informed her to call [employer] for more information, [facility] to [employer] [facility], Ms. [name] stated that she would call.

## 2016-01-24 NOTE — Telephone Encounter (Signed)
New Message   Pt has a new C-pap mask that has magnetic snaps behind the head. Wanted to make sure it is okay for pt to use due to having a defibrillator.

## 2016-01-31 ENCOUNTER — Other Ambulatory Visit: Payer: Self-pay | Admitting: *Deleted

## 2016-01-31 MED ORDER — NITROGLYCERIN 0.4 MG SL SUBL: 0.4000 mg | SUBLINGUAL_TABLET | SUBLINGUAL | 1 refills | Status: AC | PRN

## 2016-02-06 ENCOUNTER — Telehealth: Payer: Self-pay | Admitting: Family Medicine

## 2016-02-08 NOTE — Telephone Encounter (Signed)
Pt's daughter called in to follow up on refill request. She says that pt hasn't had med in 3 days.    [facility] [address] - [place], [place] - [id number]

## 2016-02-08 NOTE — Telephone Encounter (Signed)
Patients daughter notified that rx is ready at pharmacy for pickup

## 2016-02-20 ENCOUNTER — Other Ambulatory Visit: Payer: Self-pay | Admitting: Family Medicine

## 2016-02-20 DIAGNOSIS — Z961 Presence of intraocular lens: Secondary | ICD-10-CM | POA: Diagnosis not present

## 2016-02-20 DIAGNOSIS — H40013 Open angle with borderline findings, low risk, bilateral: Secondary | ICD-10-CM | POA: Diagnosis not present

## 2016-02-20 DIAGNOSIS — H3562 Retinal hemorrhage, left eye: Secondary | ICD-10-CM | POA: Diagnosis not present

## 2016-02-20 DIAGNOSIS — H35033 Hypertensive retinopathy, bilateral: Secondary | ICD-10-CM | POA: Diagnosis not present

## 2016-02-21 ENCOUNTER — Telehealth: Payer: Self-pay | Admitting: Family Medicine

## 2016-02-21 NOTE — Telephone Encounter (Signed)
Prolia Benefits verifed No PA required Patient has dual coverage and has met her Medicare Deductible.  BCBS will follow medicare guidelines  Patient could potentially owe app. $220 out of pocket

## 2016-02-21 NOTE — Telephone Encounter (Signed)
Needs cmp

## 2016-02-21 NOTE — Telephone Encounter (Signed)
Last cmp 09/09/2015 Last Bone Density 05/10/2014 Advise what to order?? Then can call the patient to arrange

## 2016-02-22 NOTE — Telephone Encounter (Signed)
Called patient to schedule CMP unable to leave message VM not activated yet.

## 2016-02-23 NOTE — Telephone Encounter (Signed)
Unable to get patient on the phone

## 2016-02-24 ENCOUNTER — Other Ambulatory Visit: Payer: Self-pay | Admitting: Family Medicine

## 2016-02-24 DIAGNOSIS — M810 Age-related osteoporosis without current pathological fracture: Secondary | ICD-10-CM

## 2016-02-24 NOTE — Telephone Encounter (Signed)
Scheduled cmp for 02/29/2016 at 10 am. Put in order. (phone number to call back with results/schedule prolia/order--[phone] [name](Donna granddaughter).

## 2016-02-28 ENCOUNTER — Encounter: Payer: Self-pay | Admitting: Pulmonary Disease

## 2016-02-28 ENCOUNTER — Ambulatory Visit (INDEPENDENT_AMBULATORY_CARE_PROVIDER_SITE_OTHER): Payer: Medicare Other | Admitting: Pulmonary Disease

## 2016-02-28 VITALS — BP 118/80 | HR 55 | Ht <= 58 in | Wt 125.0 lb

## 2016-02-28 DIAGNOSIS — J4520 Mild intermittent asthma, uncomplicated: Secondary | ICD-10-CM

## 2016-02-28 DIAGNOSIS — G4733 Obstructive sleep apnea (adult) (pediatric): Secondary | ICD-10-CM | POA: Diagnosis not present

## 2016-02-28 DIAGNOSIS — Z9989 Dependence on other enabling machines and devices: Secondary | ICD-10-CM

## 2016-02-28 NOTE — Progress Notes (Signed)
Current Outpatient Prescriptions on File Prior to Visit  Medication Sig  . albuterol (PROVENTIL HFA;VENTOLIN HFA) 108 (90 Base) MCG/ACT inhaler Inhale 2 puffs into the lungs every 6 (six) hours as needed for wheezing or shortness of breath.  [name] aspirin (ASPIR-81) 81 MG EC tablet Take 81 mg by mouth daily.    . beclomethasone (QVAR) 40 MCG/ACT inhaler Inhale 2 puffs into the lungs 2 (two) times daily.  . diphenhydrAMINE (BENADRYL) 25 mg capsule Take 25 mg by mouth every 6 (six) hours as needed for itching or allergies.  . furosemide (LASIX) 20 MG tablet Take 1 tablet (20 mg total) by mouth daily as needed for fluid or edema.  . isosorbide mononitrate (IMDUR) 30 MG 24 hr tablet TAKE ONE & ONE-HALF TABLETS BY MOUTH IN THE MORNING  . levothyroxine (SYNTHROID, LEVOTHROID) 100 MCG tablet TAKE ONE TABLET BY MOUTH ONCE DAILY BEFORE BREAKFAST  . Liniments (SALONPAS PAIN RELIEF PATCH EX) Apply 1 patch topically as needed.  [name] losartan (COZAAR) 50 MG tablet TAKE ONE TABLET BY MOUTH ONCE DAILY  . metoprolol succinate (TOPROL-XL) 50 MG 24 hr tablet TAKE ONE TABLET BY MOUTH TWICE DAILY WITH  OR  IMMEDIATELY  FOLLOWING  A  MEAL (Patient taking differently: TAKE ONE TABLET BY MOUTH DAILY WITH  OR  IMMEDIATELY  FOLLOWING  A  MEAL)  . nitroGLYCERIN (NITROSTAT) 0.4 MG SL tablet Place 1 tablet (0.4 mg total) under the tongue every 5 (five) minutes as needed. At the onset of chest pain, Up to 3 doses  . NON FORMULARY C-Pap machine, uses nightly for sleep apnea  . Omega-3 Fatty Acids (FISH OIL) 1000 MG CAPS Take 1,000 mg by mouth daily.  . pravastatin (PRAVACHOL) 20 MG tablet TAKE ONE TABLET BY MOUTH ONCE DAILY  . Probiotic Product (ALIGN PO) Take 1 tablet by mouth daily.  . Psyllium (METAFIBER) 48.57 % POWD Take 10 mLs by mouth every evening. Mixed with water   No current facility-administered medications on file prior to visit.      Chief Complaint  Patient presents with  . Follow-up    Wears CPAP nightly. Needs  new mask. Pt having a lot of trouble with current DME on getting supplies, states that she needs updated supplies. May need new DME.  DME: [facility]     Sleep tests PSG 09/09/14 >> RDI 16.8, SaO2 low 84%, REM AHI 26.7 HST 09/01/15 >> AHI 8.2, SaO2 low 85% Auto CPAP 01/27/16 to 02/25/16 >> used on 29 of 30 nights with average 5 hrs 1 min.  Average AHI 2.4 with median CPAP 12 and 95 th percentile CPAP 16 cm H2O  Past medical history CAD, ischemic CM, a/p AICD, HTN, HLD, GERD, HH, Hypothyroidism, Anxiety  Past surgical history, Family history, Social history, Allergies reviewed  Vital Signs BP 118/80 (BP Location: Left Arm, Cuff Size: Normal)   Pulse (!) 55   Ht 4\' 10"  (1.473 m)   Wt 125 lb (56.7 kg)   SpO2 94%   BMI 26.13 kg/m   History of Present Illness [name] is a 83 y.o. female with mild asthma, chronic Rt hemidiaphragm elevation, and OSA.  She needs a new DME.  [facility] hasn't been good about getting new supplies.  She was given a mask with magnets >> can't use because she has defibrillator.  She finally got a new mask that she can use, but still doesn't fit right >> and this took almost a year to get!  She is not having  much cough recently  Denies wheeze, sputum, or chest congestion.  Sporadically uses her inhalers.  Physical Exam  General - pleasant ENT - no sinus tenderness, no oral exudate, no LAN Cardiac - regular, no murmur Chest - no wheeze/rales Back - kyphotic Abd - soft, non tender Ext - no edema Neuro - normal strength Skin - no rashes Psych - normal mood   Assessment/Plan  Mild, intermittent asthma. - continue prn albuterol - resume Qvar if she gets persistent symptoms  Obstructive sleep apnea. - she is compliant with CPAP - her DME hasn't been responsive to getting new supplies >> will change from [facility] to new DME - continue auto CPAP   Patient Instructions  Will arrange for new home care company for CPAP supplies  Follow up in 1 year    Coralyn HellingVineet  Kimiye Strathman, MD [facility] Pager:  [phone] 02/28/2016, 11:19 AM

## 2016-02-28 NOTE — Patient Instructions (Signed)
Will arrange for new home care company for CPAP supplies  Follow up in 1 year 

## 2016-02-29 ENCOUNTER — Other Ambulatory Visit (INDEPENDENT_AMBULATORY_CARE_PROVIDER_SITE_OTHER): Payer: Medicare Other

## 2016-02-29 DIAGNOSIS — M810 Age-related osteoporosis without current pathological fracture: Secondary | ICD-10-CM | POA: Diagnosis not present

## 2016-02-29 LAB — COMPREHENSIVE METABOLIC PANEL
ALT: 13 U/L (ref 0–35)
AST: 21 U/L (ref 0–37)
Albumin: 4.3 g/dL (ref 3.5–5.2)
Alkaline Phosphatase: 71 U/L (ref 39–117)
BUN: 20 mg/dL (ref 6–23)
CALCIUM: 10.1 mg/dL (ref 8.4–10.5)
CHLORIDE: 102 meq/L (ref 96–112)
CO2: 31 meq/L (ref 19–32)
Creatinine, Ser: 0.72 mg/dL (ref 0.40–1.20)
GFR: 80.37 mL/min (ref >60.00)
GLUCOSE: 123 mg/dL — AB (ref 70–99)
Potassium: 3.8 mEq/L (ref 3.5–5.1)
Sodium: 137 mEq/L (ref 135–145)
Total Bilirubin: 0.6 mg/dL (ref 0.2–1.2)
Total Protein: 6.7 g/dL (ref 6.0–8.3)

## 2016-03-02 NOTE — Telephone Encounter (Signed)
CMP has been completed.  Let me know ok on results and can call her to schedule prolia

## 2016-03-02 NOTE — Telephone Encounter (Signed)
cmp is back and normal PCP instructions and ok to scheduled/order prolia. So, called the granddaughters number to schedule, but no answer/mail box full

## 2016-03-06 ENCOUNTER — Encounter: Payer: Self-pay | Admitting: Internal Medicine

## 2016-03-15 ENCOUNTER — Ambulatory Visit (INDEPENDENT_AMBULATORY_CARE_PROVIDER_SITE_OTHER): Payer: Medicare Other | Admitting: Behavioral Health

## 2016-03-15 DIAGNOSIS — M810 Age-related osteoporosis without current pathological fracture: Secondary | ICD-10-CM | POA: Diagnosis not present

## 2016-03-15 MED ORDER — DENOSUMAB 60 MG/ML ~~LOC~~ SOLN
60.0000 mg | Freq: Once | SUBCUTANEOUS | Status: AC
  Administered 2016-03-15: 60 mg via SUBCUTANEOUS

## 2016-03-15 NOTE — Progress Notes (Signed)
Pre visit review using our clinic review tool, if applicable. No additional management support is needed unless otherwise documented below in the visit note.   Pt came in for Prolia injection and tolerated it well.

## 2016-03-21 ENCOUNTER — Other Ambulatory Visit: Payer: Self-pay | Admitting: Family Medicine

## 2016-03-27 ENCOUNTER — Telehealth: Payer: Self-pay | Admitting: Pulmonary Disease

## 2016-03-27 NOTE — Telephone Encounter (Signed)
Spoke with pt's granddaughter Lupita LeashDonna, states that they have not heard about new cpap.  Order was placed last month to switch companies.    Called [employer], states that they have made several attempts to contact patient unsuccessfully.    Spoke with pt's granddaughter, aware of above.  Gave granddaughter the number to [employer] to follow up on.  Nothing further needed.

## 2016-04-05 ENCOUNTER — Encounter: Payer: Self-pay | Admitting: Internal Medicine

## 2016-04-05 ENCOUNTER — Ambulatory Visit (INDEPENDENT_AMBULATORY_CARE_PROVIDER_SITE_OTHER): Payer: Medicare Other | Admitting: Internal Medicine

## 2016-04-05 VITALS — BP 160/84 | HR 103 | Ht <= 58 in | Wt 129.6 lb

## 2016-04-05 DIAGNOSIS — I471 Supraventricular tachycardia: Secondary | ICD-10-CM | POA: Diagnosis not present

## 2016-04-05 DIAGNOSIS — Z95810 Presence of automatic (implantable) cardiac defibrillator: Secondary | ICD-10-CM | POA: Diagnosis not present

## 2016-04-05 DIAGNOSIS — Z950 Presence of cardiac pacemaker: Secondary | ICD-10-CM

## 2016-04-05 DIAGNOSIS — I5022 Chronic systolic (congestive) heart failure: Secondary | ICD-10-CM | POA: Diagnosis not present

## 2016-04-05 DIAGNOSIS — I255 Ischemic cardiomyopathy: Secondary | ICD-10-CM | POA: Diagnosis not present

## 2016-04-05 DIAGNOSIS — I2589 Other forms of chronic ischemic heart disease: Secondary | ICD-10-CM

## 2016-04-05 LAB — CUP PACEART INCLINIC DEVICE CHECK
Battery Voltage: 2.98 V
Implantable Lead Implant Date: 20051206
Implantable Lead Model: 1581
Implantable Pulse Generator Implant Date: 20171127
Lead Channel Impedance Value: 675 Ohm
Lead Channel Pacing Threshold Amplitude: 1.25 V
Lead Channel Pacing Threshold Amplitude: 1.25 V
Lead Channel Pacing Threshold Amplitude: 1.5 V
Lead Channel Pacing Threshold Pulse Width: 0.4 ms
Lead Channel Pacing Threshold Pulse Width: 1.2 ms
Lead Channel Pacing Threshold Pulse Width: 1.2 ms
Lead Channel Sensing Intrinsic Amplitude: 3.1 mV
Lead Channel Sensing Intrinsic Amplitude: 4.5 mV
Lead Channel Setting Pacing Amplitude: 2.5 V
Lead Channel Setting Pacing Amplitude: 2.5 V
Lead Channel Setting Pacing Pulse Width: 1 ms
Lead Channel Setting Pacing Pulse Width: 1.2 ms
MDC IDC LEAD IMPLANT DT: 20051206
MDC IDC LEAD IMPLANT DT: 20051206
MDC IDC LEAD LOCATION: 753859
MDC IDC LEAD LOCATION: 753860
MDC IDC LEAD LOCATION: 753860
MDC IDC MSMT LEADCHNL LV IMPEDANCE VALUE: 412.5 Ohm
MDC IDC MSMT LEADCHNL RA IMPEDANCE VALUE: 437.5 Ohm
MDC IDC MSMT LEADCHNL RA PACING THRESHOLD AMPLITUDE: 0.75 V
MDC IDC MSMT LEADCHNL RA PACING THRESHOLD AMPLITUDE: 0.75 V
MDC IDC MSMT LEADCHNL RA PACING THRESHOLD PULSEWIDTH: 0.4 ms
MDC IDC MSMT LEADCHNL RV PACING THRESHOLD AMPLITUDE: 1.5 V
MDC IDC MSMT LEADCHNL RV PACING THRESHOLD AMPLITUDE: 2.5 V
MDC IDC MSMT LEADCHNL RV PACING THRESHOLD PULSEWIDTH: 0.4 ms
MDC IDC MSMT LEADCHNL RV PACING THRESHOLD PULSEWIDTH: 1.2 ms
MDC IDC MSMT LEADCHNL RV PACING THRESHOLD PULSEWIDTH: 1.2 ms
MDC IDC PG SERIAL: 7855837
MDC IDC SESS DTM: 20180329170754
MDC IDC SET LEADCHNL RA PACING AMPLITUDE: 2 V
MDC IDC SET LEADCHNL RV SENSING SENSITIVITY: 1.5 mV
MDC IDC STAT BRADY RA PERCENT PACED: 0.42 %
MDC IDC STAT BRADY RV PERCENT PACED: 84 %

## 2016-04-05 NOTE — Patient Instructions (Signed)
Medication Instructions: - Your physician recommends that you continue on your current medications as directed. Please refer to the Current Medication list given to you today.  Labwork: - none ordered  Procedures/Testing: - none ordered  Follow-Up: - Remote monitoring is used to monitor your Pacemaker of ICD from home. This monitoring reduces the number of office visits required to check your device to one time per year. It allows us to keep an eye on the functioning of your device to ensure it is working properly. You are scheduled for a device check from home on 07/05/16. You may send your transmission at any time that day. If you have a wireless device, the transmission will be sent automatically. After your physician reviews your transmission, you will receive a postcard with your next transmission date.  - Your physician wants you to follow-up in: 9 months with Francis Dowseenee Ursuy, PA for Dr. Graciela HusbandsKlein. You will receive a reminder letter in the mail two months in advance. If you don't receive a letter, please call our office to schedule the follow-up appointment.  Any Additional Special Instructions Will Be Listed Below (If Applicable).     If you need a refill on your cardiac medications before your next appointment, please call your pharmacy.
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
## 2016-04-09 ENCOUNTER — Other Ambulatory Visit: Payer: Self-pay | Admitting: Family Medicine

## 2016-04-09 ENCOUNTER — Emergency Department (HOSPITAL_COMMUNITY): Payer: Medicare Other

## 2016-04-09 ENCOUNTER — Inpatient Hospital Stay (HOSPITAL_COMMUNITY)
Admission: EM | Admit: 2016-04-09 | Discharge: 2016-04-11 | DRG: 378 | Disposition: A | Discharge status: Home / Self Care | Payer: Medicare Other | Attending: Internal Medicine | Admitting: Internal Medicine

## 2016-04-09 ENCOUNTER — Encounter (HOSPITAL_COMMUNITY): Payer: Self-pay | Admitting: Emergency Medicine

## 2016-04-09 DIAGNOSIS — K449 Diaphragmatic hernia without obstruction or gangrene: Secondary | ICD-10-CM | POA: Diagnosis present

## 2016-04-09 DIAGNOSIS — Z79899 Other long term (current) drug therapy: Secondary | ICD-10-CM

## 2016-04-09 DIAGNOSIS — Z833 Family history of diabetes mellitus: Secondary | ICD-10-CM

## 2016-04-09 DIAGNOSIS — Z882 Allergy status to sulfonamides status: Secondary | ICD-10-CM

## 2016-04-09 DIAGNOSIS — Z90710 Acquired absence of both cervix and uterus: Secondary | ICD-10-CM

## 2016-04-09 DIAGNOSIS — J302 Other seasonal allergic rhinitis: Secondary | ICD-10-CM | POA: Diagnosis present

## 2016-04-09 DIAGNOSIS — Z95810 Presence of automatic (implantable) cardiac defibrillator: Secondary | ICD-10-CM

## 2016-04-09 DIAGNOSIS — F419 Anxiety disorder, unspecified: Secondary | ICD-10-CM | POA: Diagnosis present

## 2016-04-09 DIAGNOSIS — M1990 Unspecified osteoarthritis, unspecified site: Secondary | ICD-10-CM | POA: Diagnosis not present

## 2016-04-09 DIAGNOSIS — E039 Hypothyroidism, unspecified: Secondary | ICD-10-CM | POA: Diagnosis present

## 2016-04-09 DIAGNOSIS — K625 Hemorrhage of anus and rectum: Secondary | ICD-10-CM | POA: Diagnosis present

## 2016-04-09 DIAGNOSIS — Z87440 Personal history of urinary (tract) infections: Secondary | ICD-10-CM

## 2016-04-09 DIAGNOSIS — K921 Melena: Secondary | ICD-10-CM | POA: Diagnosis not present

## 2016-04-09 DIAGNOSIS — K5730 Diverticulosis of large intestine without perforation or abscess without bleeding: Secondary | ICD-10-CM | POA: Diagnosis present

## 2016-04-09 DIAGNOSIS — Z8249 Family history of ischemic heart disease and other diseases of the circulatory system: Secondary | ICD-10-CM

## 2016-04-09 DIAGNOSIS — I2510 Atherosclerotic heart disease of native coronary artery without angina pectoris: Secondary | ICD-10-CM | POA: Diagnosis not present

## 2016-04-09 DIAGNOSIS — I255 Ischemic cardiomyopathy: Secondary | ICD-10-CM | POA: Diagnosis present

## 2016-04-09 DIAGNOSIS — M810 Age-related osteoporosis without current pathological fracture: Secondary | ICD-10-CM | POA: Diagnosis present

## 2016-04-09 DIAGNOSIS — Z96653 Presence of artificial knee joint, bilateral: Secondary | ICD-10-CM | POA: Diagnosis present

## 2016-04-09 DIAGNOSIS — R042 Hemoptysis: Secondary | ICD-10-CM

## 2016-04-09 DIAGNOSIS — Z950 Presence of cardiac pacemaker: Secondary | ICD-10-CM | POA: Diagnosis present

## 2016-04-09 DIAGNOSIS — K922 Gastrointestinal hemorrhage, unspecified: Secondary | ICD-10-CM

## 2016-04-09 DIAGNOSIS — K219 Gastro-esophageal reflux disease without esophagitis: Secondary | ICD-10-CM | POA: Diagnosis present

## 2016-04-09 DIAGNOSIS — Z7982 Long term (current) use of aspirin: Secondary | ICD-10-CM

## 2016-04-09 DIAGNOSIS — G4733 Obstructive sleep apnea (adult) (pediatric): Secondary | ICD-10-CM | POA: Diagnosis present

## 2016-04-09 DIAGNOSIS — I5022 Chronic systolic (congestive) heart failure: Secondary | ICD-10-CM | POA: Diagnosis not present

## 2016-04-09 DIAGNOSIS — Z66 Do not resuscitate: Secondary | ICD-10-CM | POA: Diagnosis present

## 2016-04-09 DIAGNOSIS — Z955 Presence of coronary angioplasty implant and graft: Secondary | ICD-10-CM

## 2016-04-09 DIAGNOSIS — K649 Unspecified hemorrhoids: Secondary | ICD-10-CM | POA: Diagnosis present

## 2016-04-09 DIAGNOSIS — I252 Old myocardial infarction: Secondary | ICD-10-CM

## 2016-04-09 DIAGNOSIS — K222 Esophageal obstruction: Secondary | ICD-10-CM

## 2016-04-09 DIAGNOSIS — I110 Hypertensive heart disease with heart failure: Secondary | ICD-10-CM | POA: Diagnosis present

## 2016-04-09 LAB — COMPREHENSIVE METABOLIC PANEL
ALT: 19 U/L (ref 14–54)
AST: 27 U/L (ref 15–41)
Albumin: 4.6 g/dL (ref 3.5–5.0)
Alkaline Phosphatase: 77 U/L (ref 38–126)
Anion gap: 8 (ref 5–15)
BILIRUBIN TOTAL: 0.4 mg/dL (ref 0.3–1.2)
BUN: 21 mg/dL — AB (ref 6–20)
CO2: 28 mmol/L (ref 22–32)
CREATININE: 0.73 mg/dL (ref 0.44–1.00)
Calcium: 10.4 mg/dL — ABNORMAL HIGH (ref 8.9–10.3)
Chloride: 103 mmol/L (ref 101–111)
GFR calc Af Amer: >60 mL/min (ref >60)
Glucose, Bld: 109 mg/dL — ABNORMAL HIGH (ref 65–99)
POTASSIUM: 4.3 mmol/L (ref 3.5–5.1)
Sodium: 139 mmol/L (ref 135–145)
TOTAL PROTEIN: 7.6 g/dL (ref 6.5–8.1)

## 2016-04-09 LAB — CBC
HCT: 40.5 % (ref 36.0–46.0)
Hemoglobin: 13.7 g/dL (ref 12.0–15.0)
MCH: 31.2 pg (ref 26.0–34.0)
MCHC: 33.8 g/dL (ref 30.0–36.0)
MCV: 92.3 fL (ref 78.0–100.0)
PLATELETS: 198 10*3/uL (ref 150–400)
RBC: 4.39 MIL/uL (ref 3.87–5.11)
RDW: 13.4 % (ref 11.5–15.5)
WBC: 7.4 10*3/uL (ref 4.0–10.5)

## 2016-04-09 LAB — POC OCCULT BLOOD, ED: Fecal Occult Bld: POSITIVE — AB

## 2016-04-09 LAB — TYPE AND SCREEN
ABO/RH(D): O NEG
Antibody Screen: NEGATIVE

## 2016-04-09 MED ORDER — IOPAMIDOL (ISOVUE-300) INJECTION 61%
INTRAVENOUS | Status: AC
  Filled 2016-04-09: qty 100

## 2016-04-09 MED ORDER — IOPAMIDOL (ISOVUE-300) INJECTION 61%
100.0000 mL | Freq: Once | INTRAVENOUS | Status: AC | PRN
  Administered 2016-04-09: 100 mL via INTRAVENOUS

## 2016-04-09 NOTE — ED Provider Notes (Signed)
[facility]-EMERGENCY DEPT Provider Note   CSN: [id number] Arrival date & time: 04/09/16  1910     History   Chief Complaint Chief Complaint  Patient presents with  . Rectal Bleeding    HPI [name] is a 83 y.o. female.  HPI   83 yo F with extensive PMHx as below here with bright red blood per rectum. Pt reportedly has had dark black stools intermittently for several weeks. Earlier today, however, she began to have dark black and maroon-colored stools then bright red blood. There was "a lot" in the toilet bowel. Denies any associated abdominal cramping or pain. No CP, lightheadedness but has been more tired than usual. No history of stomach ulcers. No known abnormal colonoscopies.  Past Medical History:  Diagnosis Date  . Anxiety    sleep  . Arthritis   . Automatic implantable cardioverter/defibrillator (AICD) activation    s/p implantable cardioverter-defibrillator bivenrticular pacer. 12/05. St, Jude [employer]. remote-no.  [name] CAD (coronary artery disease)    a. s/p prior anterior wall myocardial infarction and multiple percutaneous coronary intervention.;  b. Aden MV 1/14:  low risk, inf and apical defect likely due to atten artifact, no ischemia, EF 35% (visually normal and est 55-60%)  . Chest pain 12/09   recent hospitalization for it. felt to be noncardiac.   [name] CHF (congestive heart failure) (HCC)   . DD (diverticular disease)    Hx of it - severe. Left colon 2004  . Esophageal stricture    last dialated 2007  . GERD (gastroesophageal reflux disease)   . Hiatal hernia   . HTN (hypertension)   . Hyperlipidemia   . Hypothyroidism   . Ischemic cardiomyopathy    EF 25-30% improved to 45% with biventricular pacing.   [name] PVT (paroxysmal ventricular tachycardia) (HCC)   . Sleep apnea    has a Cpap machine  . Systolic heart failure    improved to class I to II   . UTI (lower urinary tract infection)    recurrent. (Klebsiella pneumoniae). Last Cx 02/03/09. (R-ancef,  nitrofurantoin, Augmentin, Zyosyn)    Patient Active Problem List   Diagnosis Date Noted  . CAD (coronary artery disease)   . OSA (obstructive sleep apnea) 10/01/2014  . Nocturnal hypoxia 08/27/2014  . Olecranon bursitis of right elbow 08/13/2014  . Dyspnea 04/22/2014  . Physical exam 02/18/2014  . Headache(784.0) 09/11/2013  . Foot abrasion 09/02/2013  . Skin infection 08/19/2013  . Injury to upper leg 08/05/2013  . Need for prophylactic vaccination with tetanus-diphtheria (TD) 08/05/2013  . Hematoma of leg 08/05/2013  . Acute bronchitis 03/19/2013  . Post concussive syndrome 03/08/2013  . Cough 03/06/2013  . Tick bite of left side of sternum 06/04/2012  . Hip pain 03/06/2012  . Hammer toe 03/06/2012  . Anxiety 01/25/2012  . Hives 01/25/2012  . Back pain 01/25/2012  . Cervical dystonia 12/19/2011  . Sinusitis 12/17/2011  . Tremor 12/17/2011  . Osteoporosis 08/07/2011  . Nasal sore 07/02/2011  . Edema 05/24/2011  . Fatigue 05/24/2011  . Seasonal allergic rhinitis 05/24/2011  . Foot joint pain 04/26/2011  . Memory changes 04/16/2011  . Headache causing frequent awakening from sleep 04/16/2011  . Constipation, slow transit 04/16/2011  . Hypothyroid 02/06/2011  . Biventricular implantable cardiac defibrillator-St. Jude 07/25/2010  . St. Jude Riata lead-series of 1500 07/25/2010  . Chest pain 07/25/2010  . Lightheadedness 07/25/2010  . Leg pain 07/25/2010  . PAROXYSMAL VENTRICULAR TACHYCARDIA 11/14/2009  . HYPOTENSION, ORTHOSTATIC, HX OF 04/11/2009  .  Chronic cystitis 02/25/2009  . UTI'S, RECURRENT 02/25/2009  . FEMALE STRESS INCONTINENCE 02/25/2009  . POSTMENOPAUSAL ATROPHIC VAGINITIS 02/25/2009  . HEARTBURN 02/25/2009  . PERSONAL HISTORY OF ARTHRITIS 02/25/2009  . Other acquired absence of organ 02/25/2009  . TOTAL KNEE REPLACEMENT, HX OF 02/25/2009  . ESOPHAGEAL STRICTURE 09/03/2008  . WEIGHT LOSS-ABNORMAL 09/03/2008  . NAUSEA 09/03/2008  . ABDOMINAL PAIN  -GENERALIZED 09/03/2008  . Hyperlipidemia 10/22/2007  . CARDIOMYOPATHY, ISCHEMIC 10/22/2007  . Chronic systolic heart failure (HCC) 10/22/2007    Past Surgical History:  Procedure Laterality Date  . ANGIOPLASTY     stent  . APPENDECTOMY    . CARDIAC CATHETERIZATION     stent  . CARDIAC DEFIBRILLATOR PLACEMENT     St Jude Atlas  . CATARACT EXTRACTION, BILATERAL    . COLONOSCOPY W/ POLYPECTOMY    . EP IMPLANTABLE DEVICE N/A 12/05/2015   Procedure: ICD Generator Changeout;  Surgeon: Duke Salvia, MD;  Location: [facility] INVASIVE CV LAB;  Service: Cardiovascular;  Laterality: N/A;  . HAMMER TOE SURGERY Left 01/15/2013   Procedure: LEFT SECOND THROUGH FOURTH HAMMERTOE CORRECTION ;  Surgeon: Toni Arthurs, MD;  Location: [facility] OR;  Service: Orthopedics;  Laterality: Left;  . HAMMERTOE RECONSTRUCTION WITH WEIL OSTEOTOMY Right 07/17/2012   Procedure: RIGHT SECOND AND THIRD MT WEIL OSTEOTOMIES AND SECOND AND THIRD HAMMERTOE CORRECTIONS;  Surgeon: Toni Arthurs, MD;  Location: [facility] OR;  Service: Orthopedics;  Laterality: Right;  . INSERT / REPLACE / REMOVE PACEMAKER    . TONSILLECTOMY    . TOTAL KNEE ARTHROPLASTY     bilateral  . TUBAL LIGATION    . unspecified area hysterectomy    . WEIL OSTEOTOMY Left 01/15/2013   Procedure:  WEIL OSTEOTOMY AND DORSAL CAPSULOTOMY;  Surgeon: Toni Arthurs, MD;  Location: [facility] OR;  Service: Orthopedics;  Laterality: Left;    OB History    No data available       Home Medications    Prior to Admission medications   Medication Sig Start Date End Date Taking? Authorizing Provider  aspirin (ASPIR-81) 81 MG EC tablet Take 81 mg by mouth daily.     Yes Historical Provider, MD  beclomethasone (QVAR) 40 MCG/ACT inhaler Inhale 2 puffs into the lungs 2 (two) times daily. Patient taking differently: Inhale 2 puffs into the lungs 2 (two) times daily as needed (shortness of breathe).  02/18/15  Yes Edward Saguier, PA-C  diphenhydrAMINE (BENADRYL) 25 mg capsule Take 25 mg by mouth  every 6 (six) hours as needed for itching or allergies.   Yes Historical Provider, MD  furosemide (LASIX) 20 MG tablet Take 1 tablet (20 mg total) by mouth daily as needed for fluid or edema. 06/29/15  Yes Duke Salvia, MD  isosorbide mononitrate (IMDUR) 30 MG 24 hr tablet TAKE ONE & ONE-HALF TABLETS BY MOUTH IN THE MORNING 09/29/15  Yes Yvonne R Lowne Chase, DO  levothyroxine (SYNTHROID, LEVOTHROID) 100 MCG tablet TAKE ONE TABLET BY MOUTH ONCE DAILY BEFORE BREAKFAST 04/09/16  Yes Yvonne R Lowne Chase, DO  Liniments (SALONPAS PAIN RELIEF PATCH EX) Apply 1 patch topically daily as needed (pain).    Yes Historical Provider, MD  losartan (COZAAR) 50 MG tablet TAKE ONE TABLET BY MOUTH ONCE DAILY 03/21/16  Yes Yvonne R Lowne Chase, DO  metoprolol succinate (TOPROL-XL) 50 MG 24 hr tablet TAKE ONE TABLET BY MOUTH TWICE DAILY WITH  OR  IMMEDIATELY  FOLLOWING  A  MEAL Patient taking differently: TAKE ONE TABLET BY MOUTH DAILY WITH  OR  IMMEDIATELY  FOLLOWING  A  MEAL 09/29/15  Yes Yvonne R Lowne Chase, DO  nitroGLYCERIN (NITROSTAT) 0.4 MG SL tablet Place 1 tablet (0.4 mg total) under the tongue every 5 (five) minutes as needed. At the onset of chest pain, Up to 3 doses 01/31/16  Yes Duke Salvia, MD  NON FORMULARY C-Pap machine, uses nightly for sleep apnea   Yes Historical Provider, MD  pravastatin (PRAVACHOL) 20 MG tablet TAKE ONE TABLET BY MOUTH ONCE DAILY 08/23/15  Yes Grayling Congress Lowne Chase, DO  Probiotic Product (ALIGN PO) Take 1 tablet by mouth daily.   Yes Historical Provider, MD  Psyllium (METAFIBER) 48.57 % POWD Take 10 mLs by mouth every evening. Mixed with water   Yes Historical Provider, MD  albuterol (PROVENTIL HFA;VENTOLIN HFA) 108 (90 Base) MCG/ACT inhaler Inhale 2 puffs into the lungs every 6 (six) hours as needed for wheezing or shortness of breath. Patient not taking: Reported on 04/09/2016 08/18/15   Coralyn Helling, MD    Family History Family History  Problem Relation Age of Onset  . Heart  disease Mother   . Heart disease Father   . Colon polyps Sister   . Heart disease Brother     multiple brothers  . Heart disease Sister     multiple sisters  . Prostate cancer Brother   . Diabetes Daughter   . Diabetes      aunt  . Breast cancer Daughter   . Alcohol abuse Son   . Brain cancer Son   . Lung cancer Son   . Colon cancer Neg Hx     Social History Social History  Substance Use Topics  . Smoking status: Never Smoker  . Smokeless tobacco: Never Used  . Alcohol use No     Allergies   Sulfonamide derivatives   Review of Systems Review of Systems  Constitutional: Positive for fatigue. Negative for chills and fever.  HENT: Negative for congestion, rhinorrhea and sore throat.   Eyes: Negative for visual disturbance.  Respiratory: Negative for cough, shortness of breath and wheezing.   Cardiovascular: Negative for chest pain and leg swelling.  Gastrointestinal: Positive for anal bleeding and blood in stool. Negative for abdominal pain, diarrhea, nausea and vomiting.  Genitourinary: Negative for dysuria, flank pain, vaginal bleeding and vaginal discharge.  Musculoskeletal: Negative for neck pain.  Skin: Negative for rash.  Allergic/Immunologic: Negative for immunocompromised state.  Neurological: Negative for syncope and headaches.  Hematological: Does not bruise/bleed easily.  All other systems reviewed and are negative.    Physical Exam Updated Vital Signs BP (!) 152/76 (BP Location: Left Arm)   Pulse 80   Temp 98.2 F (36.8 C)   Resp 16   Ht  (1.473 m)   Wt 125 lb (56.7 kg)   SpO2 96%   BMI 26.13 kg/m   Physical Exam  Constitutional: She is oriented to person, place, and time. She appears well-developed and well-nourished. No distress.  HENT:  Head: Normocephalic and atraumatic.  Eyes: Conjunctivae are normal.  Neck: Neck supple.  Cardiovascular: Normal rate, regular rhythm and normal heart sounds.  Exam reveals no friction rub.   No  murmur heard. Pulmonary/Chest: Effort normal and breath sounds normal. No respiratory distress. She has no wheezes. She has no rales.  Abdominal: She exhibits no distension.  Genitourinary:  Genitourinary Comments: Maroon-colored and bloody stool in rectal vault. No active external bleeding. No external or internal hemorrhoids appreciated.  Musculoskeletal: She exhibits no edema.  Neurological: She is alert and oriented to person, place, and time. She exhibits normal muscle tone.  Skin: Skin is warm. Capillary refill takes less than 2 seconds.  Psychiatric: She has a normal mood and affect.  Nursing note and vitals reviewed.    ED Treatments / Results  Labs (all labs ordered are listed, but only abnormal results are displayed) Labs Reviewed  COMPREHENSIVE METABOLIC PANEL - Abnormal; Notable for the following:       Result Value   Glucose, Bld 109 (*)    BUN 21 (*)    Calcium 10.4 (*)    All other components within normal limits  POC OCCULT BLOOD, ED - Abnormal; Notable for the following:    Fecal Occult Bld POSITIVE (*)    All other components within normal limits  CBC  POC OCCULT BLOOD, ED  TYPE AND SCREEN  ABO/RH    EKG  EKG Interpretation None       Radiology No results found.  Procedures Procedures (including critical care time)  Medications Ordered in ED Medications  iopamidol (ISOVUE-300) 61 % injection (not administered)  iopamidol (ISOVUE-300) 61 % injection 100 mL (100 mLs Intravenous Contrast Given 04/09/16 2231)     Initial Impression / Assessment and Plan / ED Course  I have reviewed the triage vital signs and the nursing notes.  Pertinent labs & imaging results that were available during my care of the patient were reviewed by me and considered in my medical decision making (see chart for details).    83 yo F with PMHx as above here with bright red blood per rectum. On arrival, VSS. Exam does show maroon-colored, bloody stools. CT scan shows  diverticulosis. Concern for LGIB 2/2 diverticulosis, DDx includes polpy, hemorrhoids, also UGIB given report of dark black stools. On ASA. Given age, comorbidities, degree of bleeding, will admit.  Final Clinical Impressions(s) / ED Diagnoses   Final diagnoses:  Lower GI bleed      Shaune Pollack, MD 04/09/16 [phone]

## 2016-04-10 ENCOUNTER — Encounter (HOSPITAL_COMMUNITY): Payer: Self-pay | Admitting: Nurse Practitioner

## 2016-04-10 ENCOUNTER — Observation Stay (HOSPITAL_COMMUNITY): Payer: Medicare Other

## 2016-04-10 DIAGNOSIS — J302 Other seasonal allergic rhinitis: Secondary | ICD-10-CM | POA: Diagnosis present

## 2016-04-10 DIAGNOSIS — K625 Hemorrhage of anus and rectum: Secondary | ICD-10-CM

## 2016-04-10 DIAGNOSIS — K5730 Diverticulosis of large intestine without perforation or abscess without bleeding: Secondary | ICD-10-CM | POA: Diagnosis present

## 2016-04-10 DIAGNOSIS — K219 Gastro-esophageal reflux disease without esophagitis: Secondary | ICD-10-CM | POA: Diagnosis present

## 2016-04-10 DIAGNOSIS — G4733 Obstructive sleep apnea (adult) (pediatric): Secondary | ICD-10-CM | POA: Diagnosis present

## 2016-04-10 DIAGNOSIS — M1990 Unspecified osteoarthritis, unspecified site: Secondary | ICD-10-CM | POA: Diagnosis present

## 2016-04-10 DIAGNOSIS — I5022 Chronic systolic (congestive) heart failure: Secondary | ICD-10-CM | POA: Diagnosis present

## 2016-04-10 DIAGNOSIS — R042 Hemoptysis: Secondary | ICD-10-CM | POA: Diagnosis not present

## 2016-04-10 DIAGNOSIS — I2510 Atherosclerotic heart disease of native coronary artery without angina pectoris: Secondary | ICD-10-CM | POA: Diagnosis present

## 2016-04-10 DIAGNOSIS — I255 Ischemic cardiomyopathy: Secondary | ICD-10-CM | POA: Diagnosis present

## 2016-04-10 DIAGNOSIS — I252 Old myocardial infarction: Secondary | ICD-10-CM | POA: Diagnosis not present

## 2016-04-10 DIAGNOSIS — Z79899 Other long term (current) drug therapy: Secondary | ICD-10-CM | POA: Diagnosis not present

## 2016-04-10 DIAGNOSIS — I110 Hypertensive heart disease with heart failure: Secondary | ICD-10-CM | POA: Diagnosis present

## 2016-04-10 DIAGNOSIS — Z955 Presence of coronary angioplasty implant and graft: Secondary | ICD-10-CM | POA: Diagnosis not present

## 2016-04-10 DIAGNOSIS — M810 Age-related osteoporosis without current pathological fracture: Secondary | ICD-10-CM | POA: Diagnosis present

## 2016-04-10 DIAGNOSIS — Z66 Do not resuscitate: Secondary | ICD-10-CM | POA: Diagnosis present

## 2016-04-10 DIAGNOSIS — E039 Hypothyroidism, unspecified: Secondary | ICD-10-CM | POA: Diagnosis present

## 2016-04-10 DIAGNOSIS — K921 Melena: Secondary | ICD-10-CM | POA: Diagnosis present

## 2016-04-10 DIAGNOSIS — Z96653 Presence of artificial knee joint, bilateral: Secondary | ICD-10-CM | POA: Diagnosis present

## 2016-04-10 DIAGNOSIS — Z87440 Personal history of urinary (tract) infections: Secondary | ICD-10-CM | POA: Diagnosis not present

## 2016-04-10 DIAGNOSIS — K649 Unspecified hemorrhoids: Secondary | ICD-10-CM | POA: Diagnosis present

## 2016-04-10 DIAGNOSIS — K449 Diaphragmatic hernia without obstruction or gangrene: Secondary | ICD-10-CM | POA: Diagnosis present

## 2016-04-10 DIAGNOSIS — K922 Gastrointestinal hemorrhage, unspecified: Secondary | ICD-10-CM | POA: Diagnosis not present

## 2016-04-10 DIAGNOSIS — Z90710 Acquired absence of both cervix and uterus: Secondary | ICD-10-CM | POA: Diagnosis not present

## 2016-04-10 DIAGNOSIS — Z95810 Presence of automatic (implantable) cardiac defibrillator: Secondary | ICD-10-CM | POA: Diagnosis not present

## 2016-04-10 DIAGNOSIS — F419 Anxiety disorder, unspecified: Secondary | ICD-10-CM | POA: Diagnosis present

## 2016-04-10 DIAGNOSIS — Z7982 Long term (current) use of aspirin: Secondary | ICD-10-CM | POA: Diagnosis not present

## 2016-04-10 LAB — BASIC METABOLIC PANEL
ANION GAP: 4 — AB (ref 5–15)
BUN: 22 mg/dL — ABNORMAL HIGH (ref 6–20)
CALCIUM: 9.7 mg/dL (ref 8.9–10.3)
CO2: 30 mmol/L (ref 22–32)
CREATININE: 0.72 mg/dL (ref 0.44–1.00)
Chloride: 104 mmol/L (ref 101–111)
GFR calc non Af Amer: >60 mL/min (ref >60)
Glucose, Bld: 111 mg/dL — ABNORMAL HIGH (ref 65–99)
Potassium: 3.9 mmol/L (ref 3.5–5.1)
SODIUM: 138 mmol/L (ref 135–145)

## 2016-04-10 LAB — CBC
HCT: 37 % (ref 36.0–46.0)
HCT: 36.1 % (ref 36.0–46.0)
HEMATOCRIT: 39 % (ref 36.0–46.0)
Hemoglobin: 12.4 g/dL (ref 12.0–15.0)
Hemoglobin: 12.2 g/dL (ref 12.0–15.0)
Hemoglobin: 13.2 g/dL (ref 12.0–15.0)
MCH: 30.8 pg (ref 26.0–34.0)
MCH: 31 pg (ref 26.0–34.0)
MCH: 31.1 pg (ref 26.0–34.0)
MCHC: 33.5 g/dL (ref 30.0–36.0)
MCHC: 33.8 g/dL (ref 30.0–36.0)
MCHC: 33.8 g/dL (ref 30.0–36.0)
MCV: 91.8 fL (ref 78.0–100.0)
MCV: 91.9 fL (ref 78.0–100.0)
MCV: 92 fL (ref 78.0–100.0)
PLATELETS: 155 10*3/uL (ref 150–400)
Platelets: 166 10*3/uL (ref 150–400)
Platelets: 166 10*3/uL (ref 150–400)
RBC: 4.03 MIL/uL (ref 3.87–5.11)
RBC: 3.93 MIL/uL (ref 3.87–5.11)
RBC: 4.24 MIL/uL (ref 3.87–5.11)
RDW: 13.3 % (ref 11.5–15.5)
RDW: 13.3 % (ref 11.5–15.5)
RDW: 13.5 % (ref 11.5–15.5)
WBC: 7.4 10*3/uL (ref 4.0–10.5)
WBC: 6.3 10*3/uL (ref 4.0–10.5)
WBC: 5.5 10*3/uL (ref 4.0–10.5)

## 2016-04-10 LAB — ABO/RH: ABO/RH(D): O NEG

## 2016-04-10 MED ORDER — PRAVASTATIN SODIUM 20 MG PO TABS
20.0000 mg | ORAL_TABLET | Freq: Every day | ORAL | Status: DC
  Administered 2016-04-10: 20 mg via ORAL
  Filled 2016-04-10: qty 1

## 2016-04-10 MED ORDER — ACETAMINOPHEN 325 MG PO TABS: 650.0000 mg | ORAL_TABLET | Freq: Four times a day (QID) | ORAL | Status: DC | PRN

## 2016-04-10 MED ORDER — NITROGLYCERIN 0.4 MG SL SUBL: 0.4000 mg | SUBLINGUAL_TABLET | SUBLINGUAL | Status: DC | PRN

## 2016-04-10 MED ORDER — LOSARTAN POTASSIUM 50 MG PO TABS
50.0000 mg | ORAL_TABLET | Freq: Every day | ORAL | Status: DC
  Administered 2016-04-10 – 2016-04-11 (×2): 50 mg via ORAL
  Filled 2016-04-10 (×2): qty 1

## 2016-04-10 MED ORDER — ISOSORBIDE MONONITRATE ER 30 MG PO TB24
45.0000 mg | ORAL_TABLET | Freq: Every day | ORAL | Status: DC
  Administered 2016-04-10: 60 mg via ORAL
  Administered 2016-04-11: 45 mg via ORAL
  Filled 2016-04-10 (×2): qty 2

## 2016-04-10 MED ORDER — ACETAMINOPHEN 650 MG RE SUPP: 650.0000 mg | Freq: Four times a day (QID) | RECTAL | Status: DC | PRN

## 2016-04-10 MED ORDER — METOPROLOL SUCCINATE ER 50 MG PO TB24
50.0000 mg | ORAL_TABLET | Freq: Every day | ORAL | Status: DC
  Administered 2016-04-10 – 2016-04-11 (×2): 50 mg via ORAL
  Filled 2016-04-10 (×2): qty 1

## 2016-04-10 MED ORDER — BUDESONIDE 0.25 MG/2ML IN SUSP
0.2500 mg | Freq: Two times a day (BID) | RESPIRATORY_TRACT | Status: DC
  Administered 2016-04-10 – 2016-04-11 (×3): 0.25 mg via RESPIRATORY_TRACT
  Filled 2016-04-10 (×3): qty 2

## 2016-04-10 MED ORDER — LEVOTHYROXINE SODIUM 100 MCG PO TABS
100.0000 ug | ORAL_TABLET | Freq: Every day | ORAL | Status: DC
  Administered 2016-04-10 – 2016-04-11 (×2): 100 ug via ORAL
  Filled 2016-04-10 (×2): qty 1

## 2016-04-10 NOTE — Care Management Note (Signed)
Case Management Note  Patient Details  Name: [name] MRN: [id number] Date of Birth: 1921-02-13  Subjective/Objective: 83 y/o f admitted w/Rectal bleed. From home, alone,indep PTA,has rw,cane.                   Action/Plan:d/c plan home.   Expected Discharge Date:                  Expected Discharge Plan:  Home/Self Care  In-House Referral:     Discharge planning Services  CM Consult  Post Acute Care Choice:    Choice offered to:     DME Arranged:    DME Agency:     HH Arranged:    HH Agency:     Status of Service:  In process, will continue to follow  If discussed at Long Length of Stay Meetings, dates discussed:    Additional Comments:  Lanier Clam, RN 04/10/2016, 11:36 AM

## 2016-04-10 NOTE — Progress Notes (Signed)
Taking over care of patient agree previous RN assessment. Denies any needs at this time. Will continue to monitor.  

## 2016-04-10 NOTE — H&P (Signed)
History and Physical    [name]:[id number] DOB: 1921-06-21 DOA: 04/09/2016  PCP: Donato Schultz, DO  Patient coming from: Home.  Chief Complaint: Rectal bleeding.  HPI: [name] is a 83 y.o. female with history of CAD status post MI ischemic cardiomyopathy status post defibrillator placement, hypothyroidism, sleep apnea presents to the ER after patient had large bloody bowel movement. Patient states she has been noticing some blood in the stools last few days. Patient denies taking any Motrin or Advil. Yesterday afternoon patient had a large bloody bowel movement. Patient also had some in the ER. Patient states 2 weeks ago patient had coughed up some blood. Denies any chest pain or shortness of breath.  ED Course: Patient was hemodynamically stable. CT of the abdomen and pelvis done showed nothing acute but does show some atheromatous plaque. Patient is being admitted for further observation.  Review of Systems: As per HPI, rest all negative.   Past Medical History:  Diagnosis Date  . Anxiety    sleep  . Arthritis   . Automatic implantable cardioverter/defibrillator (AICD) activation    s/p implantable cardioverter-defibrillator bivenrticular pacer. 12/05. St, Jude [employer]. remote-no.  [name] CAD (coronary artery disease)    a. s/p prior anterior wall myocardial infarction and multiple percutaneous coronary intervention.;  b. [place]en MV 1/14:  low risk, inf and apical defect likely due to atten artifact, no ischemia, EF 35% (visually normal and est 55-60%)  . Chest pain 12/09   recent hospitalization for it. felt to be noncardiac.   [name] CHF (congestive heart failure) (HCC)   . DD (diverticular disease)    Hx of it - severe. Left colon 2004  . Esophageal stricture    last dialated 2007  . GERD (gastroesophageal reflux disease)   . Hiatal hernia   . HTN (hypertension)   . Hyperlipidemia   . Hypothyroidism   . Ischemic cardiomyopathy    EF 25-30% improved to 45%  with biventricular pacing.   [name] PVT (paroxysmal ventricular tachycardia) (HCC)   . Sleep apnea    has a Cpap machine  . Systolic heart failure    improved to class I to II   . UTI (lower urinary tract infection)    recurrent. (Klebsiella pneumoniae). Last Cx 02/03/09. (R-ancef, nitrofurantoin, Augmentin, Zyosyn)    Past Surgical History:  Procedure Laterality Date  . ANGIOPLASTY     stent  . APPENDECTOMY    . CARDIAC CATHETERIZATION     stent  . CARDIAC DEFIBRILLATOR PLACEMENT     St Jude Atlas  . CATARACT EXTRACTION, BILATERAL    . COLONOSCOPY W/ POLYPECTOMY    . EP IMPLANTABLE DEVICE N/A 12/05/2015   Procedure: ICD Generator Changeout;  Surgeon: Duke Salvia, MD;  Location: [facility] INVASIVE CV LAB;  Service: Cardiovascular;  Laterality: N/A;  . HAMMER TOE SURGERY Left 01/15/2013   Procedure: LEFT SECOND THROUGH FOURTH HAMMERTOE CORRECTION ;  Surgeon: Toni Arthurs, MD;  Location: [facility] OR;  Service: Orthopedics;  Laterality: Left;  . HAMMERTOE RECONSTRUCTION WITH WEIL OSTEOTOMY Right 07/17/2012   Procedure: RIGHT SECOND AND THIRD MT WEIL OSTEOTOMIES AND SECOND AND THIRD HAMMERTOE CORRECTIONS;  Surgeon: Toni Arthurs, MD;  Location: [facility] OR;  Service: Orthopedics;  Laterality: Right;  . INSERT / REPLACE / REMOVE PACEMAKER    . TONSILLECTOMY    . TOTAL KNEE ARTHROPLASTY     bilateral  . TUBAL LIGATION    . unspecified area hysterectomy    . WEIL OSTEOTOMY  Left 01/15/2013   Procedure:  WEIL OSTEOTOMY AND DORSAL CAPSULOTOMY;  Surgeon: Toni Arthurs, MD;  Location: [facility] OR;  Service: Orthopedics;  Laterality: Left;     reports that she has never smoked. She has never used smokeless tobacco. She reports that she does not drink alcohol or use drugs.  Allergies  Allergen Reactions  . Sulfonamide Derivatives Itching and Rash    Family History  Problem Relation Age of Onset  . Heart disease Mother   . Heart disease Father   . Colon polyps Sister   . Heart disease Brother     multiple brothers  .  Heart disease Sister     multiple sisters  . Prostate cancer Brother   . Diabetes Daughter   . Diabetes      aunt  . Breast cancer Daughter   . Alcohol abuse Son   . Brain cancer Son   . Lung cancer Son   . Colon cancer Neg Hx     Prior to Admission medications   Medication Sig Start Date End Date Taking? Authorizing Provider  aspirin (ASPIR-81) 81 MG EC tablet Take 81 mg by mouth daily.     Yes Historical Provider, MD  beclomethasone (QVAR) 40 MCG/ACT inhaler Inhale 2 puffs into the lungs 2 (two) times daily. Patient taking differently: Inhale 2 puffs into the lungs 2 (two) times daily as needed (shortness of breathe).  02/18/15  Yes Edward Saguier, PA-C  diphenhydrAMINE (BENADRYL) 25 mg capsule Take 25 mg by mouth every 6 (six) hours as needed for itching or allergies.   Yes Historical Provider, MD  furosemide (LASIX) 20 MG tablet Take 1 tablet (20 mg total) by mouth daily as needed for fluid or edema. 06/29/15  Yes Duke Salvia, MD  isosorbide mononitrate (IMDUR) 30 MG 24 hr tablet TAKE ONE & ONE-HALF TABLETS BY MOUTH IN THE MORNING 09/29/15  Yes Yvonne R Lowne Chase, DO  levothyroxine (SYNTHROID, LEVOTHROID) 100 MCG tablet TAKE ONE TABLET BY MOUTH ONCE DAILY BEFORE BREAKFAST 04/09/16  Yes Yvonne R Lowne Chase, DO  Liniments (SALONPAS PAIN RELIEF PATCH EX) Apply 1 patch topically daily as needed (pain).    Yes Historical Provider, MD  losartan (COZAAR) 50 MG tablet TAKE ONE TABLET BY MOUTH ONCE DAILY 03/21/16  Yes Yvonne R Lowne Chase, DO  metoprolol succinate (TOPROL-XL) 50 MG 24 hr tablet TAKE ONE TABLET BY MOUTH TWICE DAILY WITH  OR  IMMEDIATELY  FOLLOWING  A  MEAL Patient taking differently: TAKE ONE TABLET BY MOUTH DAILY WITH  OR  IMMEDIATELY  FOLLOWING  A  MEAL 09/29/15  Yes Yvonne R Lowne Chase, DO  nitroGLYCERIN (NITROSTAT) 0.4 MG SL tablet Place 1 tablet (0.4 mg total) under the tongue every 5 (five) minutes as needed. At the onset of chest pain, Up to 3 doses 01/31/16  Yes Duke Salvia, MD  NON FORMULARY C-Pap machine, uses nightly for sleep apnea   Yes Historical Provider, MD  pravastatin (PRAVACHOL) 20 MG tablet TAKE ONE TABLET BY MOUTH ONCE DAILY 08/23/15  Yes Grayling Congress Lowne Chase, DO  Probiotic Product (ALIGN PO) Take 1 tablet by mouth daily.   Yes Historical Provider, MD  Psyllium (METAFIBER) 48.57 % POWD Take 10 mLs by mouth every evening. Mixed with water   Yes Historical Provider, MD  albuterol (PROVENTIL HFA;VENTOLIN HFA) 108 (90 Base) MCG/ACT inhaler Inhale 2 puffs into the lungs every 6 (six) hours as needed for wheezing or shortness of breath. Patient not taking: Reported  on 04/09/2016 08/18/15   Coralyn Helling, MD    Physical Exam: Vitals:   04/09/16 1943 04/09/16 2041 04/09/16 2331 04/10/16 0115  BP: (!) 157/92 (!) 152/76 115/66 (!) 149/74  Pulse: 91 80 96 87  Resp: (!) Temp: 98.2 F (36.8 C)   97.7 F (36.5 C)  TempSrc:    Oral  SpO2: 93% 96% 96% 97%  Weight: 56.7 kg (125 lb)   58.7 kg (129 lb 6.6 oz)  Height:  (1.473 m)    (1.473 m)      Constitutional: Moderately built and nourished. Vitals:   04/09/16 1943 04/09/16 2041 04/09/16 2331 04/10/16 0115  BP: (!) 157/92 (!) 152/76 115/66 (!) 149/74  Pulse: 91 80 96 87  Resp: (!) Temp: 98.2 F (36.8 C)   97.7 F (36.5 C)  TempSrc:    Oral  SpO2: 93% 96% 96% 97%  Weight: 56.7 kg (125 lb)   58.7 kg (129 lb 6.6 oz)  Height:  (1.473 m)    (1.473 m)   Eyes: Anicteric no pallor. ENMT: No discharge from the ears eyes nose and mouth. Neck: No mass felt. No neck rigidity. Respiratory: No rhonchi or crepitations. Cardiovascular: S1 and S2. No murmurs appreciated. Abdomen: Soft nontender bowel sounds present. Musculoskeletal: No edema. No joint effusion. Skin: No rash. Skin appears warm. Neurologic: Alert awake oriented to time place and person. Moves all extremities. Psychiatric: Appears normal. Normal affect.   Labs on Admission: I have personally  reviewed following labs and imaging studies  CBC:  Recent Labs Lab 04/09/16 1953  WBC 7.4  HGB 13.7  HCT 40.5  MCV 92.3  PLT 198   Basic Metabolic Panel:  Recent Labs Lab 04/09/16 1953  NA 139  K 4.3  CL 103  CO2 28  GLUCOSE 109*  BUN 21*  CREATININE 0.73  CALCIUM 10.4*   GFR: Estimated Creatinine Clearance: 34 mL/min (by C-G formula based on SCr of 0.73 mg/dL). Liver Function Tests:  Recent Labs Lab 04/09/16 1953  AST 27  ALT 19  ALKPHOS 77  BILITOT 0.4  PROT 7.6  ALBUMIN 4.6   No results for input(s): LIPASE, AMYLASE in the last 168 hours. No results for input(s): AMMONIA in the last 168 hours. Coagulation Profile: No results for input(s): INR, PROTIME in the last 168 hours. Cardiac Enzymes: No results for input(s): CKTOTAL, CKMB, CKMBINDEX, TROPONINI in the last 168 hours. BNP (last 3 results) No results for input(s): PROBNP in the last 8760 hours. HbA1C: No results for input(s): HGBA1C in the last 72 hours. CBG: No results for input(s): GLUCAP in the last 168 hours. Lipid Profile: No results for input(s): CHOL, HDL, LDLCALC, TRIG, CHOLHDL, LDLDIRECT in the last 72 hours. Thyroid Function Tests: No results for input(s): TSH, T4TOTAL, FREET4, T3FREE, THYROIDAB in the last 72 hours. Anemia Panel: No results for input(s): VITAMINB12, FOLATE, FERRITIN, TIBC, IRON, RETICCTPCT in the last 72 hours. Urine analysis:    Component Value Date/Time   COLORURINE YELLOW 04/01/2010 1425   APPEARANCEUR CLEAR 04/01/2010 1425   LABSPEC 1.011 04/01/2010 1425   PHURINE 5.5 04/01/2010 1425   GLUCOSEU NEGATIVE 04/01/2010 1425   HGBUR SMALL (A) 04/01/2010 1425   BILIRUBINUR neg 09/20/2015 1708   KETONESUR NEGATIVE 04/01/2010 1425   PROTEINUR neg 09/20/2015 1708   PROTEINUR NEGATIVE 04/01/2010 1425   UROBILINOGEN 0.2 09/20/2015 1708   UROBILINOGEN 0.2 04/01/2010 1425   NITRITE neg 09/20/2015  1708   NITRITE NEGATIVE 04/01/2010 1425   LEUKOCYTESUR Negative  09/20/2015 1708   Sepsis Labs: (procalcitonin:4,lacticidven:4) )No results found for this or any previous visit (from the past 240 hour(s)).   Radiological Exams on Admission: Ct Abdomen Pelvis W Contrast  Result Date: 04/09/2016 CLINICAL DATA:  83 y/o F; mid to lower abdominal pain and rectal bleeding. History of hysterectomy, appendectomy. EXAM: CT ABDOMEN AND PELVIS WITH CONTRAST TECHNIQUE: Multidetector CT imaging of the abdomen and pelvis was performed using the standard protocol following bolus administration of intravenous contrast. CONTRAST:  ISOVUE-300 IOPAMIDOL (ISOVUE-300) INJECTION 61% COMPARISON:  01/19/2005 CT of the abdomen and pelvis. FINDINGS: Lower chest: Severe coronary artery calcification. Partially visualized pacemaking wires. Moderate hiatal hernia. Elevated right hemidiaphragm with bowel supra position over the liver. Hepatobiliary: Punctate calcification within the dome of liver compatible sequelae of prior granulomatous disease. Gallstones. No intra or extrahepatic biliary ductal dilatation. Pancreas: Unremarkable. No pancreatic ductal dilatation or surrounding inflammatory changes. Spleen: Normal in size without focal abnormality. Adrenals/Urinary Tract: Several 2-3 mm calcific densities within the kidneys bilaterally are compatible with nonobstructing stones. No hydronephrosis or urinary stone disease is identified. Lucencies within the kidneys bilaterally measuring up to 11 mm in the right interpolar kidney are compatible with cysts. Normal bladder. Stomach/Bowel: Stomach is within normal limits. Appendix resected. No evidence of bowel wall thickening, distention, or inflammatory changes. Extensive sigmoid diverticulosis. Vascular/Lymphatic: Severe calcific atherosclerosis of the abdominal aorta. No aortic aneurysm. Calcified plaques of the major branch vessels of the aorta with moderate to severe proximal SMA stenosis and probably moderate left renal artery  stenosis. Reproductive: Status post hysterectomy. No adnexal masses. Other: No abdominal wall hernia or abnormality. No abdominopelvic ascites. Musculoskeletal: Severe levoscoliosis of the lumbar spine with apex at L2 and advanced lumbar degenerative changes. IMPRESSION: 1. No acute process identified. 2. Extensive sigmoid diverticulosis, no diverticulitis identified. 3. Severe coronary artery calcification. 4. Moderate hiatal hernia. 5. Gallstones. 6. Nonobstructing bilateral kidney stones. 7. Severe calcific atherosclerosis of the abdominal aorta. Plaques of aortic branch vessel origins with moderate to severe proximal SMA and probable moderate left renal artery stenosis. 8. Advanced lumbar spine degenerative changes and severe levocurvature. Electronically Signed   By: Mitzi Hansen M.D.   On: 04/09/2016 23:23     Assessment/Plan Principal Problem:   Rectal bleeding Active Problems:   ESOPHAGEAL STRICTURE   Biventricular implantable cardioverter-defibrillator in situ   Osteoporosis    1. Rectal bleeding - likely diverticular in origin. CT scan shows multiple sigmoid diverticula. Patient also had a colonoscopy in 2004 which showed sigmoid diverticulosis. Patient has had previous EGD which showed strictures in the esophagus. For now will place patient on clear liquid diet and follow serial CBCs. 2. CAD status post stenting - holding her aspirin due to GI bleed. Continue beta blocker statins. 3. Ischemic cardiomyopathy status post defibrillator placement - recently seen by Dr. Graciela Husbands patient's cardiologist. Hold off Lasix for now due to GI bleed. Patient appears compensated. Continue Cozaar. 4. History of sleep apnea on CPAP.   DVT prophylaxis: SCDs. Code Status: DO NOT RESUSCITATE.  Family Communication: Patient's granddaughter.  Disposition Plan: Home.  Consults called: None.  Admission status: Observation.    Eduard Clos MD [facility] Pager [phone].  If 7PM-7AM, please contact night-coverage [email or web address] Password [email or web address]  04/10/2016, 2:13 AM

## 2016-04-10 NOTE — Care Management Obs Status (Signed)
MEDICARE OBSERVATION STATUS NOTIFICATION   Patient Details  Name: KADEDRA VANAKEN MRN: [id number] Date of Birth: November 20, 1921   Medicare Observation Status Notification Given:  Yes    MahabirOlegario Messier, RN 04/10/2016, 11:35 AM

## 2016-04-10 NOTE — Progress Notes (Signed)
Seen patient and agree with Dr. Perlie Mayo input  81 ? CAd with multiple PCI  LHC last 10/2003--Nuclear testing most recently 01/2012 Sys Hf + CM  s/p CRT St Jude PPm downgraded form ICD-Dr. Graciela Husbands hypothyroid OSA on cpap  Last sleep study 09/09/14-Dr. Craige Cotta Known Hiatal hernia nd Ch elevation R hemidiaphragm 2/2 to prominent thoracic scoliosis Cervical dystonia s/p Botox 02/2013 -dr tat Lewie Chamber toes s/p surgery 01/2013 -Dr. Victorino Dike  Prior solid food dysphagia seen last 03/28/12 Dr. Consuella Lose [place]]-s/p dilatation of stricture 2004/2007/2010  Presenting with Lower GIB as per HPI Ct abd pelv=diverticulosis-extensive Hemoglobin has not fallen significantly--usual balseine 13, currently 12.  Doing fair  Very pleasant coherent and intact.   No cp no fever no n/v  Tells me had an episode of bloody emesis ~ 2 weeks prior-no repeat bleed.  Also has dysphagia that has been going on for 3-4 mo Her new GI has indicated to her that she wouldn't be a candidate for dilatation? Tolerating PO meds most of the time and diet as well  Expectant management support with IVF and transfusion and liquid diet for now If large bleed, will consult GI for recommendations If no bleed and hemoglobin stale-expect d/c early am  Pleas Koch, MD [facility] [phone]

## 2016-04-10 NOTE — Procedures (Signed)
CPAP is bedside and ready for use.  Patient stated she will place on herself when ready for bed.  Patient is aware to ask RN to call Respiratory if she needs further assistance.

## 2016-04-11 LAB — CBC WITH DIFFERENTIAL/PLATELET
BASOS ABS: 0 10*3/uL (ref 0.0–0.1)
BASOS PCT: 1 %
EOS ABS: 0.4 10*3/uL (ref 0.0–0.7)
Eosinophils Relative: 6 %
HCT: 38.9 % (ref 36.0–46.0)
HEMOGLOBIN: 13.2 g/dL (ref 12.0–15.0)
Lymphocytes Relative: 23 %
Lymphs Abs: 1.5 10*3/uL (ref 0.7–4.0)
MCH: 31.3 pg (ref 26.0–34.0)
MCHC: 33.9 g/dL (ref 30.0–36.0)
MCV: 92.2 fL (ref 78.0–100.0)
MONOS PCT: 9 %
Monocytes Absolute: 0.6 10*3/uL (ref 0.1–1.0)
NEUTROS PCT: 61 %
Neutro Abs: 4.1 10*3/uL (ref 1.7–7.7)
Platelets: 165 10*3/uL (ref 150–400)
RBC: 4.22 MIL/uL (ref 3.87–5.11)
RDW: 13.5 % (ref 11.5–15.5)
WBC: 6.6 10*3/uL (ref 4.0–10.5)

## 2016-04-11 NOTE — Care Management Note (Signed)
Case Management Note  Patient Details  Name: [name] MRN: [id number] Date of Birth: 1921-04-08  Subjective/Objective: No further CM needs.                   Action/Plan:d/c home.   Expected Discharge Date:                  Expected Discharge Plan:  Home/Self Care  In-House Referral:     Discharge planning Services  CM Consult  Post Acute Care Choice:    Choice offered to:     DME Arranged:    DME Agency:     HH Arranged:    HH Agency:     Status of Service:  Completed, signed off  If discussed at [employer] of Stay Meetings, dates discussed:    Additional Comments:  Lanier Clam, RN 04/11/2016, 11:24 AM

## 2016-04-11 NOTE — Discharge Summary (Signed)
Discharge Summary  [name] UJW:[id number] DOB: February 20, 1921  PCP: Donato Schultz, DO  Admit date: 04/09/2016 Discharge date: 04/11/2016  Time spent: <89mins  Recommendations for Outpatient Follow-up:  1. F/u with PMD within a week  for hospital discharge follow up, repeat cbc/bmp at follow up 2. f/u with [facility] as needed  Discharge Diagnoses:  Active Hospital Problems   Diagnosis Date Noted  . Rectal bleeding 04/09/2016  . Osteoporosis 08/07/2011  . Biventricular implantable cardioverter-defibrillator in situ 07/25/2010  . ESOPHAGEAL STRICTURE 09/03/2008    Resolved Hospital Problems   Diagnosis Date Noted Date Resolved  No resolved problems to display.    Discharge Condition: stable  Diet recommendation: heart healthy  Filed Weights   04/09/16 1943 04/10/16 0115  Weight: 56.7 kg (125 lb) 58.7 kg (129 lb 6.6 oz)    History of present illness:  PCP: Donato Schultz, DO  Patient coming from: Home.  Chief Complaint: Rectal bleeding.  HPI: [name] is a 83 y.o. female with history of CAD status post MI ischemic cardiomyopathy status post defibrillator placement, hypothyroidism, sleep apnea presents to the ER after patient had large bloody bowel movement. Patient states she has been noticing some blood in the stools last few days. Patient denies taking any Motrin or Advil. Yesterday afternoon patient had a large bloody bowel movement. Patient also had some in the ER. Patient states 2 weeks ago patient had coughed up some blood. Denies any chest pain or shortness of breath.  ED Course: Patient was hemodynamically stable. CT of the abdomen and pelvis done showed nothing acute but does show some atheromatous plaque. Patient is being admitted for further observation.  Hospital Course:  Principal Problem:   Rectal bleeding Active Problems:   ESOPHAGEAL STRICTURE   Biventricular implantable cardioverter-defibrillator in situ    Osteoporosis  1. Rectal bleeding - likely diverticular vs hemorrhoids bleed, painless, no fever, she report it is bright color. CT scan shows multiple sigmoid diverticula. Patient also had a colonoscopy in 2004 which showed sigmoid diverticulosis. Patient has had previous EGD which showed strictures in the esophagus. Symptoms resolved, no difficulty with oral intake, no n/v, hgb stable, she want to go home, she is discharged home with close follow up with pmd and GI as needed. ( she was told in the past by GI , she is not a good candidate endoscope due to advanced age) 2. CAD status post stenting -  aspirin held in the hospital, resumed at discharge,  Continue beta blocker statins. Denies chest pain, no sob. 3. Ischemic cardiomyopathy status post defibrillator placement - recently seen by Dr. Graciela Husbands patient's cardiologist. Lasix held in the hospital, resumed on as needed base at discharge. Patient appears compensated. Continue Cozaar. 4. History of sleep apnea on CPAP.   DVT prophylaxis: SCDs. Code Status: DO NOT RESUSCITATE.  Family Communication: Patient's granddaughter.  Disposition Plan: Home, granddaughter is the main care giver.  Consults called: None.    Procedures:  CT Ab/pel  Consultations:  none  Discharge Exam: BP 125/77 (BP Location: Left Arm)   Pulse 78   Temp 98.3 F (36.8 C) (Oral)   Resp 16   Ht  (1.473 m)   Wt 58.7 kg (129 lb 6.6 oz)   SpO2 96%   BMI 27.05 kg/m   General: NAD, very pleasant Cardiovascular: RRR Respiratory: CTABL Abdomen: soft, NT/ND, positive bowel sounds  Discharge Instructions You were cared for by a hospitalist during your hospital stay. If  you have any questions about your discharge medications or the care you received while you were in the hospital after you are discharged, you can call the unit and asked to speak with the hospitalist on call if the hospitalist that took care of you is not available. Once you are discharged, your  primary care physician will handle any further medical issues. Please note that NO REFILLS for any discharge medications will be authorized once you are discharged, as it is imperative that you return to your primary care physician (or establish a relationship with a primary care physician if you do not have one) for your aftercare needs so that they can reassess your need for medications and monitor your lab values.  Discharge Instructions    Diet - low sodium heart healthy    Complete by:  As directed    Increase activity slowly    Complete by:  As directed      Allergies as of 04/11/2016      Reactions   Sulfonamide Derivatives Itching, Rash      Medication List    TAKE these medications   albuterol 108 (90 Base) MCG/ACT inhaler Commonly known as:  PROVENTIL HFA;VENTOLIN HFA Inhale 2 puffs into the lungs every 6 (six) hours as needed for wheezing or shortness of breath.   ALIGN PO Take 1 tablet by mouth daily.   ASPIR-81 81 MG EC tablet Generic drug:  aspirin Take 81 mg by mouth daily.   beclomethasone 40 MCG/ACT inhaler Commonly known as:  QVAR Inhale 2 puffs into the lungs 2 (two) times daily. What changed:  when to take this  reasons to take this   diphenhydrAMINE 25 mg capsule Commonly known as:  BENADRYL Take 25 mg by mouth every 6 (six) hours as needed for itching or allergies.   furosemide 20 MG tablet Commonly known as:  LASIX Take 1 tablet (20 mg total) by mouth daily as needed for fluid or edema.   isosorbide mononitrate 30 MG 24 hr tablet Commonly known as:  IMDUR TAKE ONE & ONE-HALF TABLETS BY MOUTH IN THE MORNING   levothyroxine 100 MCG tablet Commonly known as:  SYNTHROID, LEVOTHROID TAKE ONE TABLET BY MOUTH ONCE DAILY BEFORE BREAKFAST   losartan 50 MG tablet Commonly known as:  COZAAR TAKE ONE TABLET BY MOUTH ONCE DAILY   METAFIBER 48.57 % Powd Generic drug:  Psyllium Take 10 mLs by mouth every evening. Mixed with water   metoprolol  succinate 50 MG 24 hr tablet Commonly known as:  TOPROL-XL TAKE ONE TABLET BY MOUTH TWICE DAILY WITH  OR  IMMEDIATELY  FOLLOWING  A  MEAL What changed:  See the new instructions.   nitroGLYCERIN 0.4 MG SL tablet Commonly known as:  NITROSTAT Place 1 tablet (0.4 mg total) under the tongue every 5 (five) minutes as needed. At the onset of chest pain, Up to 3 doses   NON FORMULARY C-Pap machine, uses nightly for sleep apnea   pravastatin 20 MG tablet Commonly known as:  PRAVACHOL TAKE ONE TABLET BY MOUTH ONCE DAILY   SALONPAS PAIN RELIEF PATCH EX Apply 1 patch topically daily as needed (pain).      Allergies  Allergen Reactions  . Sulfonamide Derivatives Itching and Rash   Follow-up Information    Lelon Perla Chase, DO Follow up in 1 week(s).   Specialty:  Family Medicine Why:  hospital discharge follow up, repeat cbc/bmp at follow up. Contact information: [address] [place] [address] [phone]        [facility] Follow up in 2 week(s).   Specialty:  Gastroenterology Why:  as needed  if difficulty swallowing, or continued sign of gi bleed. Contact information: [address] [place] [address] [phone]           The results of significant diagnostics from this hospitalization (including imaging, microbiology, ancillary and laboratory) are listed below for reference.    Significant Diagnostic Studies: Ct Abdomen Pelvis W Contrast  Result Date: 04/09/2016 CLINICAL DATA:  83 y/o F; mid to lower abdominal pain and rectal bleeding. History of hysterectomy, appendectomy. EXAM: CT ABDOMEN AND PELVIS WITH CONTRAST TECHNIQUE: Multidetector CT imaging of the abdomen and pelvis was performed using the standard protocol following bolus administration of intravenous contrast. CONTRAST:  ISOVUE-300 IOPAMIDOL (ISOVUE-300) INJECTION 61% COMPARISON:  01/19/2005 CT of the abdomen and pelvis. FINDINGS: Lower  chest: Severe coronary artery calcification. Partially visualized pacemaking wires. Moderate hiatal hernia. Elevated right hemidiaphragm with bowel supra position over the liver. Hepatobiliary: Punctate calcification within the dome of liver compatible sequelae of prior granulomatous disease. Gallstones. No intra or extrahepatic biliary ductal dilatation. Pancreas: Unremarkable. No pancreatic ductal dilatation or surrounding inflammatory changes. Spleen: Normal in size without focal abnormality. Adrenals/Urinary Tract: Several 2-3 mm calcific densities within the kidneys bilaterally are compatible with nonobstructing stones. No hydronephrosis or urinary stone disease is identified. Lucencies within the kidneys bilaterally measuring up to 11 mm in the right interpolar kidney are compatible with cysts. Normal bladder. Stomach/Bowel: Stomach is within normal limits. Appendix resected. No evidence of bowel wall thickening, distention, or inflammatory changes. Extensive sigmoid diverticulosis. Vascular/Lymphatic: Severe calcific atherosclerosis of the abdominal aorta. No aortic aneurysm. Calcified plaques of the major branch vessels of the aorta with moderate to severe proximal SMA stenosis and probably moderate left renal artery stenosis. Reproductive: Status post hysterectomy. No adnexal masses. Other: No abdominal wall hernia or abnormality. No abdominopelvic ascites. Musculoskeletal: Severe levoscoliosis of the lumbar spine with apex at L2 and advanced lumbar degenerative changes. IMPRESSION: 1. No acute process identified. 2. Extensive sigmoid diverticulosis, no diverticulitis identified. 3. Severe coronary artery calcification. 4. Moderate hiatal hernia. 5. Gallstones. 6. Nonobstructing bilateral kidney stones. 7. Severe calcific atherosclerosis of the abdominal aorta. Plaques of aortic branch vessel origins with moderate to severe proximal SMA and probable moderate left renal artery stenosis. 8. Advanced lumbar  spine degenerative changes and severe levocurvature. Electronically Signed   By: Mitzi Hansen M.D.   On: 04/09/2016 23:23   Dg Chest Port 1 View  Result Date: 04/10/2016 CLINICAL DATA:  Hemoptysis EXAM: PORTABLE CHEST 1 VIEW COMPARISON:  02/18/2015 FINDINGS: Normal heart size when accounting for scoliosis and elevated diaphragm on the right. There is a biventricular pacer from the left with leads in stable position. Large hiatal hernia. There is no edema, consolidation, effusion, or pneumothorax. IMPRESSION: No acute finding or change from prior. No evidence of alveolar hemorrhage. Electronically Signed   By: Marnee Spring M.D.   On: 04/10/2016 07:00    Microbiology: No results found for this or any previous visit (from the past 240 hour(s)).   Labs: Basic Metabolic Panel:  Recent Labs Lab 04/09/16 1953 04/10/16 0246  NA 139 138  K 4.3 3.9  CL 103 104  CO2 28 30  GLUCOSE 109* 111*  BUN 21* 22*  CREATININE 0.73 0.72  CALCIUM 10.4* 9.7   Liver Function Tests:  Recent Labs Lab 04/09/16 1953  AST 27  ALT 19  ALKPHOS 77  BILITOT 0.4  PROT 7.6  ALBUMIN 4.6   No results for input(s): LIPASE, AMYLASE in the last 168 hours. No results for input(s): AMMONIA in the last 168 hours. CBC:  Recent Labs Lab 04/09/16 1953 04/10/16 0246 04/10/16 0552 04/10/16 1015 04/11/16 0643  WBC 7.4 7.4 6.3 5.5 6.6  NEUTROABS  --   --   --   --  4.1  HGB 13.7 12.4 12.2 13.2 13.2  HCT 40.5 37.0 36.1 39.0 38.9  MCV 92.3 91.8 91.9 92.0 92.2  PLT 198 166 155 166 165   Cardiac Enzymes: No results for input(s): CKTOTAL, CKMB, CKMBINDEX, TROPONINI in the last 168 hours. BNP: BNP (last 3 results) No results for input(s): BNP in the last 8760 hours.  ProBNP (last 3 results) No results for input(s): PROBNP in the last 8760 hours.  CBG: No results for input(s): GLUCAP in the last 168 hours.     SignedAlbertine Grates MD, PhD  [facility] 04/11/2016, 1:05 PM

## 2016-04-12 ENCOUNTER — Encounter: Payer: Self-pay | Admitting: Behavioral Health

## 2016-04-12 ENCOUNTER — Telehealth: Payer: Self-pay | Admitting: Behavioral Health

## 2016-04-12 NOTE — Telephone Encounter (Signed)
Transition Care Management Follow-up Telephone Call  PCP: Donato Schultz, DO  Admit date: 04/09/2016 Discharge date: 04/11/2016  Recommendations for Outpatient Follow-up:  1. F/u with PMD within a week  for hospital discharge follow up, repeat cbc/bmp at follow up 2. f/u with [facility] as needed  Discharge Condition: stable   How have you been since you were released from the hospital? "Doing good".   Do you understand why you were in the hospital? yes, diverticulosis flare-up.   Do you understand the discharge instructions? yes   Where were you discharged to? Home   Items Reviewed:  Medications reviewed: yes  Allergies reviewed: yes  Dietary changes reviewed: yes, heart healthy diet  Referrals reviewed: yes, F/u with PMD within a week  for hospital discharge follow up, repeat cbc/bmp at follow up; f/u with [facility] as needed  Functional Questionnaire:   Activities of Daily Living (ADLs):   She states they are independent in the following: ambulation, bathing and hygiene, feeding, continence, grooming, toileting and dressing States they require assistance with the following: None   Any transportation issues/concerns?: no   Any patient concerns? no   Confirmed importance and date/time of follow-up visits scheduled yes, 04/19/16 at 11:30 AM.  Provider Appointment booked with Esperanza Richters, PA-C.  Confirmed with patient if condition begins to worsen call PCP or go to the ER.  Patient was given the office number and encouraged to call back with question or concerns.  : yes

## 2016-04-19 ENCOUNTER — Encounter: Payer: Self-pay | Admitting: Medical

## 2016-04-19 ENCOUNTER — Ambulatory Visit (INDEPENDENT_AMBULATORY_CARE_PROVIDER_SITE_OTHER): Payer: Medicare Other | Admitting: Medical

## 2016-04-19 VITALS — BP 146/79 | HR 87 | Temp 98.0°F | Resp 16 | Ht <= 58 in | Wt 128.2 lb

## 2016-04-19 DIAGNOSIS — I255 Ischemic cardiomyopathy: Secondary | ICD-10-CM

## 2016-04-19 DIAGNOSIS — K625 Hemorrhage of anus and rectum: Secondary | ICD-10-CM | POA: Diagnosis not present

## 2016-04-19 DIAGNOSIS — K5790 Diverticulosis of intestine, part unspecified, without perforation or abscess without bleeding: Secondary | ICD-10-CM

## 2016-04-19 NOTE — Progress Notes (Signed)
Subjective:    Patient ID: [name], female    DOB: [birth year], 83 y.o.   MRN: [id number]  HPI   Pt in for follow up.  Pt states she feels good today. She updates me that she was diagnosed with diverculitis and she was hospitalized. DC summary states diverticulitis vs hemorrhoids.  Pt states she had one event of bright blood on day she went to ED. But before she went to ED she thought she saw dark black stools.  Since hospital discharge she has no bright blood in stool and stool does not look.black. But reports specs of stool maybe black.  Pt blood work blood work showed no anemia in hospital.  Pt did have positive stool card on 04-09-2016 visit.  On review pt denies history of hemorrhoids. Also no hemorrhoid type symptoms.    Review of Systems  Constitutional: Negative for chills, fatigue and fever.  Respiratory: Negative for cough, chest tightness, shortness of breath and wheezing.   Cardiovascular: Negative for chest pain and palpitations.  Gastrointestinal: Negative for abdominal pain, blood in stool, diarrhea and nausea.  Musculoskeletal: Negative for back pain.  Skin: Negative for rash.  Neurological: Negative for dizziness, weakness and headaches.  Hematological: Negative for adenopathy. Does not bruise/bleed easily.  Psychiatric/Behavioral: Negative for behavioral problems and confusion.    Past Medical History:  Diagnosis Date  . Anxiety    sleep  . Arthritis   . Automatic implantable cardioverter/defibrillator (AICD) activation    s/p implantable cardioverter-defibrillator bivenrticular pacer. 12/05. St, Jude Medtronic. remote-no.  [name] CAD (coronary artery disease)    a. s/p prior anterior wall myocardial infarction and multiple percutaneous coronary intervention.;  b. Aden MV 1/14:  low risk, inf and apical defect likely due to atten artifact, no ischemia, EF 35% (visually normal and est 55-60%)  . Chest pain 12/09   recent hospitalization for it. felt to be  noncardiac.   [name] CHF (congestive heart failure) (HCC)   . DD (diverticular disease)    Hx of it - severe. Left colon 2004  . Esophageal stricture    last dialated 2007  . GERD (gastroesophageal reflux disease)   . Hiatal hernia   . HTN (hypertension)   . Hyperlipidemia   . Hypothyroidism   . Ischemic cardiomyopathy    EF 25-30% improved to 45% with biventricular pacing.   [name] PVT (paroxysmal ventricular tachycardia) (HCC)   . Sleep apnea    has a Cpap machine  . Systolic heart failure    improved to class I to II   . UTI (lower urinary tract infection)    recurrent. (Klebsiella pneumoniae). Last Cx 02/03/09. (R-ancef, nitrofurantoin, Augmentin, Zyosyn)     Social History   Social History  . Marital status: Widowed    Spouse name: N/A  . Number of children: 4  . Years of education: N/A   Occupational History  . Retired    Social History Main Topics  . Smoking status: Never Smoker  . Smokeless tobacco: Never Used  . Alcohol use No  . Drug use: No  . Sexual activity: Not on file   Other Topics Concern  . Not on file   Social History Narrative   Widowed, husband died of brain tumor. Son lives in [place], [place]- was in a MVA for DWI. He has had a long hx of alcohol problems, she has helped him out financially in the past but is ready to be much stricter. Pt. Has a daughter who live  in [place] with Breast Ca. She also lost a son to a brain tumor.     Past Surgical History:  Procedure Laterality Date  . ANGIOPLASTY     stent  . APPENDECTOMY    . CARDIAC CATHETERIZATION     stent  . CARDIAC DEFIBRILLATOR PLACEMENT     St Jude Atlas  . CATARACT EXTRACTION, BILATERAL    . COLONOSCOPY W/ POLYPECTOMY    . EP IMPLANTABLE DEVICE N/A 12/05/2015   Procedure: ICD Generator Changeout;  Surgeon: Duke Salvia, MD;  Location: [facility] INVASIVE CV LAB;  Service: Cardiovascular;  Laterality: N/A;  . HAMMER TOE SURGERY Left 01/15/2013   Procedure: LEFT SECOND THROUGH FOURTH HAMMERTOE CORRECTION ;   Surgeon: Toni Arthurs, MD;  Location: [facility] OR;  Service: Orthopedics;  Laterality: Left;  . HAMMERTOE RECONSTRUCTION WITH WEIL OSTEOTOMY Right 07/17/2012   Procedure: RIGHT SECOND AND THIRD MT WEIL OSTEOTOMIES AND SECOND AND THIRD HAMMERTOE CORRECTIONS;  Surgeon: Toni Arthurs, MD;  Location: [facility] OR;  Service: Orthopedics;  Laterality: Right;  . INSERT / REPLACE / REMOVE PACEMAKER    . TONSILLECTOMY    . TOTAL KNEE ARTHROPLASTY     bilateral  . TUBAL LIGATION    . unspecified area hysterectomy    . WEIL OSTEOTOMY Left 01/15/2013   Procedure:  WEIL OSTEOTOMY AND DORSAL CAPSULOTOMY;  Surgeon: Toni Arthurs, MD;  Location: [facility] OR;  Service: Orthopedics;  Laterality: Left;    Family History  Problem Relation Age of Onset  . Heart disease Mother   . Heart disease Father   . Colon polyps Sister   . Heart disease Brother     multiple brothers  . Heart disease Sister     multiple sisters  . Prostate cancer Brother   . Diabetes Daughter   . Diabetes      aunt  . Breast cancer Daughter   . Alcohol abuse Son   . Brain cancer Son   . Lung cancer Son   . Colon cancer Neg Hx     Allergies  Allergen Reactions  . Sulfonamide Derivatives Itching and Rash    Current Outpatient Prescriptions on File Prior to Visit  Medication Sig Dispense Refill  . albuterol (PROVENTIL HFA;VENTOLIN HFA) 108 (90 Base) MCG/ACT inhaler Inhale 2 puffs into the lungs every 6 (six) hours as needed for wheezing or shortness of breath. 1 Inhaler 2  . aspirin (ASPIR-81) 81 MG EC tablet Take 81 mg by mouth daily.      . beclomethasone (QVAR) 40 MCG/ACT inhaler Inhale 2 puffs into the lungs 2 (two) times daily. 1 Inhaler 3  . diphenhydrAMINE (BENADRYL) 25 mg capsule Take 25 mg by mouth every 6 (six) hours as needed for itching or allergies.    . furosemide (LASIX) 20 MG tablet Take 1 tablet (20 mg total) by mouth daily as needed for fluid or edema. 90 tablet 3  . isosorbide mononitrate (IMDUR) 30 MG 24 hr tablet TAKE ONE &  ONE-HALF TABLETS BY MOUTH IN THE MORNING 45 tablet 5  . levothyroxine (SYNTHROID, LEVOTHROID) 100 MCG tablet TAKE ONE TABLET BY MOUTH ONCE DAILY BEFORE BREAKFAST 30 tablet 0  . Liniments (SALONPAS PAIN RELIEF PATCH EX) Apply 1 patch topically daily as needed (pain).     [name] losartan (COZAAR) 50 MG tablet TAKE ONE TABLET BY MOUTH ONCE DAILY 90 tablet 0  . metoprolol succinate (TOPROL-XL) 50 MG 24 hr tablet TAKE ONE TABLET BY MOUTH TWICE DAILY WITH  OR  IMMEDIATELY  FOLLOWING  A  MEAL (Patient taking differently: TAKE ONE TABLET BY MOUTH DAILY WITH  OR  IMMEDIATELY  FOLLOWING  A  MEAL) 60 tablet 5  . nitroGLYCERIN (NITROSTAT) 0.4 MG SL tablet Place 1 tablet (0.4 mg total) under the tongue every 5 (five) minutes as needed. At the onset of chest pain, Up to 3 doses 25 tablet 1  . NON FORMULARY C-Pap machine, uses nightly for sleep apnea    . pravastatin (PRAVACHOL) 20 MG tablet TAKE ONE TABLET BY MOUTH ONCE DAILY 90 tablet 0  . Probiotic Product (ALIGN PO) Take 1 tablet by mouth daily.    . Psyllium (METAFIBER) 48.57 % POWD Take 10 mLs by mouth every evening. Mixed with water     No current facility-administered medications on file prior to visit.     BP (!) 146/79 (BP Location: Left Arm, Patient Position: Sitting, Cuff Size: Normal)   Pulse 87   Temp 98 F (36.7 C) (Oral)   Resp 16   Ht  (1.473 m)   Wt 128 lb 3.2 oz (58.2 kg)   SpO2 97%   BMI 26.79 kg/m       Objective:   Physical Exam  General Mental Status- Alert. General Appearance- Not in acute distress.   Skin General: Color- Normal Color. Moisture- Normal Moisture.  Neck Carotid Arteries- Normal color. Moisture- Normal Moisture. No carotid bruits. No JVD.  Chest and Lung Exam Auscultation: Breath Sounds:-Normal.  Cardiovascular Auscultation:Rythm- Regular. Murmurs & Other Heart Sounds:Auscultation of the heart reveals- No Murmurs.  Abdomen Inspection:-Inspeection Normal. Palpation/Percussion:Note:No mass.  Palpation and Percussion of the abdomen reveal- Non Tender, Non Distended + BS, no rebound or guarding.    Neurologic Cranial Nerve exam:- CN III-XII intact(No nystagmus), symmetric smile. Strength:- 5/5 equal and symmetric strength both upper and lower extremities.      Assessment & Plan:  For your history of rectal bleeding and hospitalization will get cbc and cmp today.  Your rectal bleeding you had may have been hemorrhoidal vs diverticular. [place] not sure of cause and ED recommended GI evaluation. So will refer.  Continue current meds   Follow up date to be determined after lab review.  Makaleigh Reinard, Ramon Dredge, PA-C

## 2016-04-19 NOTE — Progress Notes (Signed)
Pre visit review using our clinic review tool, if applicable. No additional management support is needed unless otherwise documented below in the visit note. 

## 2016-04-19 NOTE — Patient Instructions (Addendum)
For your history of rectal bleeding and hospitalization will get cbc and cmp today.  Your rectal bleeding you had may have been hemorrhoidal vs diverticular. [place] not sure of cause and ED recommended GI evaluation. So will refer.  Continue current meds   Follow up date to be determined after lab review.   Diverticulitis Diverticulitis is when small pockets in your large intestine (colon) get infected or swollen. This causes stomach pain and watery poop (diarrhea). These pouches are called diverticula. They form in people who have a condition called diverticulosis. Follow these instructions at home: Medicines   Take over-the-counter and prescription medicines only as told by your doctor. These include:  Antibiotics.  Pain medicines.  Fiber pills.  Probiotics.  Stool softeners.  Do not drive or use heavy machinery while taking prescription pain medicine.  If you were prescribed an antibiotic, take it as told. Do not stop taking it even if you feel better. General instructions   Follow a diet as told by your doctor.  When you feel better, your doctor may tell you to change your diet. You may need to eat a lot of fiber. Fiber makes it easier to poop (have bowel movements). Healthy foods with fiber include:  Berries.  Beans.  Lentils.  Green vegetables.  Exercise 3 or more times a week. Aim for 30 minutes each time. Exercise enough to sweat and make your heart beat faster.  Keep all follow-up visits as told. This is important. You may need to have an exam of the large intestine. This is called a colonoscopy. Contact a doctor if:  Your pain does not get better.  You have a hard time eating or drinking.  You are not pooping like normal. Get help right away if:  Your pain gets worse.  Your problems do not get better.  Your problems get worse very fast.  You have a fever.  You throw up (vomit) more than one time.  You have poop that  is:  Bloody.  Black.  [place]arry. Summary  Diverticulitis is when small pockets in your large intestine (colon) get infected or swollen.  Take medicines only as told by your doctor.  Follow a diet as told by your doctor. This information is not intended to replace advice given to you by your health care provider. Make sure you discuss any questions you have with your health care provider. Document Released: 06/13/2007 Document Revised: 01/12/2016 Document Reviewed: 01/12/2016 Elsevier Interactive Patient Education  2017 [employer].

## 2016-04-20 LAB — CBC WITH DIFFERENTIAL/PLATELET
BASOS ABS: 0.1 10*3/uL (ref 0.0–0.1)
Basophils Relative: 1.3 % (ref 0.0–3.0)
EOS ABS: 0.3 10*3/uL (ref 0.0–0.7)
Eosinophils Relative: 5.1 % — ABNORMAL HIGH (ref 0.0–5.0)
HEMATOCRIT: 39.7 % (ref 36.0–46.0)
HEMOGLOBIN: 13.2 g/dL (ref 12.0–15.0)
LYMPHS PCT: 22.6 % (ref 12.0–46.0)
Lymphs Abs: 1.5 10*3/uL (ref 0.7–4.0)
MCHC: 33.2 g/dL (ref 30.0–36.0)
MCV: 93.7 fl (ref 78.0–100.0)
MONO ABS: 0.7 10*3/uL (ref 0.1–1.0)
Monocytes Relative: 11.5 % (ref 3.0–12.0)
Neutro Abs: 3.8 10*3/uL (ref 1.4–7.7)
Neutrophils Relative %: 59.5 % (ref 43.0–77.0)
Platelets: 202 10*3/uL (ref 150.0–400.0)
RBC: 4.24 Mil/uL (ref 3.87–5.11)
RDW: 14 % (ref 11.5–15.5)
WBC: 6.4 10*3/uL (ref 4.0–10.5)

## 2016-04-20 LAB — COMPREHENSIVE METABOLIC PANEL
ALBUMIN: 4.4 g/dL (ref 3.5–5.2)
ALK PHOS: 64 U/L (ref 39–117)
ALT: 13 U/L (ref 0–35)
AST: 19 U/L (ref 0–37)
BUN: 16 mg/dL (ref 6–23)
CALCIUM: 10.2 mg/dL (ref 8.4–10.5)
CHLORIDE: 102 meq/L (ref 96–112)
CO2: 32 mEq/L (ref 19–32)
CREATININE: 0.63 mg/dL (ref 0.40–1.20)
GFR: 93.74 mL/min (ref >60.00)
Glucose, Bld: 120 mg/dL — ABNORMAL HIGH (ref 70–99)
Potassium: 4.7 mEq/L (ref 3.5–5.1)
SODIUM: 140 meq/L (ref 135–145)
TOTAL PROTEIN: 6.8 g/dL (ref 6.0–8.3)
Total Bilirubin: 0.5 mg/dL (ref 0.2–1.2)

## 2016-04-24 ENCOUNTER — Other Ambulatory Visit: Payer: Self-pay | Admitting: Emergency Medicine

## 2016-04-24 ENCOUNTER — Other Ambulatory Visit (INDEPENDENT_AMBULATORY_CARE_PROVIDER_SITE_OTHER): Payer: Medicare Other

## 2016-04-24 DIAGNOSIS — K625 Hemorrhage of anus and rectum: Secondary | ICD-10-CM

## 2016-04-24 LAB — FECAL OCCULT BLOOD, IMMUNOCHEMICAL: Fecal Occult Bld: NEGATIVE

## 2016-05-04 ENCOUNTER — Other Ambulatory Visit: Payer: Self-pay | Admitting: Family Medicine

## 2016-05-15 ENCOUNTER — Other Ambulatory Visit: Payer: Self-pay | Admitting: Family Medicine

## 2016-05-17 ENCOUNTER — Encounter: Payer: Self-pay | Admitting: Gastroenterology

## 2016-05-17 ENCOUNTER — Ambulatory Visit (INDEPENDENT_AMBULATORY_CARE_PROVIDER_SITE_OTHER): Payer: Medicare Other | Admitting: Gastroenterology

## 2016-05-17 VITALS — BP 130/60 | HR 88 | Ht <= 58 in | Wt 129.0 lb

## 2016-05-17 DIAGNOSIS — K5730 Diverticulosis of large intestine without perforation or abscess without bleeding: Secondary | ICD-10-CM

## 2016-05-17 DIAGNOSIS — I255 Ischemic cardiomyopathy: Secondary | ICD-10-CM

## 2016-05-17 DIAGNOSIS — K222 Esophageal obstruction: Secondary | ICD-10-CM

## 2016-05-17 DIAGNOSIS — K625 Hemorrhage of anus and rectum: Secondary | ICD-10-CM

## 2016-05-17 NOTE — Progress Notes (Signed)
[name]    [id number]    Mar 06, 1921  Primary Care Physician:Lowne Almeta Monashase, Grayling CongressYvonne R, DO  Referring Physician: Esperanza RichtersSaguier, Edward, PA-C [address] [place], [place] [id number]  Chief complaint:  Follow-up status post recent hospitalization  HPI:  83 year old female with history of hiatal hernia, GERD, recurrent distal esophageal stricture status post multiple distal esophageal dilation most recent in 2010, severe diverticulosis was recently 04/09/2016 to 04/11/2016 with rectal bleeding. She was having large volume blood per rectum for a few days prior to admission. She was hemodynamically stable. Hemoglobin was stable at 13. She hasn't had any further episodes of bleeding in the past 1 month. Patient is feeling well overall and has no specific GI complaints. Denies any nausea, vomiting, abdominal pain, melena or bright red blood per rectum Repeat labs on 04/19/2016 unchanged with stable hemoglobin 13.2.   Outpatient Encounter Prescriptions as of 05/17/2016  Medication Sig  . albuterol (PROVENTIL HFA;VENTOLIN HFA) 108 (90 Base) MCG/ACT inhaler Inhale 2 puffs into the lungs every 6 (six) hours as needed for wheezing or shortness of breath.  [name]. aspirin (ASPIR-81) 81 MG EC tablet Take 81 mg by mouth daily.    . beclomethasone (QVAR) 40 MCG/ACT inhaler Inhale 2 puffs into the lungs 2 (two) times daily.  . diphenhydrAMINE (BENADRYL) 25 mg capsule Take 25 mg by mouth every 6 (six) hours as needed for itching or allergies.  . furosemide (LASIX) 20 MG tablet Take 1 tablet (20 mg total) by mouth daily as needed for fluid or edema.  . isosorbide mononitrate (IMDUR) 30 MG 24 hr tablet TAKE ONE & ONE-HALF TABLETS BY MOUTH ONCE DAILY IN THE MORNING  . levothyroxine (SYNTHROID, LEVOTHROID) 100 MCG tablet TAKE 1 TABLET BY MOUTH ONCE DAILY BEFORE BREAKFAST  . Liniments (SALONPAS PAIN RELIEF PATCH EX) Apply 1 patch topically daily as needed (pain).   [name]. losartan (COZAAR) 50 MG  tablet TAKE ONE TABLET BY MOUTH ONCE DAILY  . metoprolol succinate (TOPROL-XL) 50 MG 24 hr tablet TAKE ONE TABLET BY MOUTH TWICE DAILY WITH  OR  IMMEDIATELY  FOLLOWING  A  MEAL (Patient taking differently: TAKE ONE TABLET BY MOUTH DAILY WITH  OR  IMMEDIATELY  FOLLOWING  A  MEAL)  . nitroGLYCERIN (NITROSTAT) 0.4 MG SL tablet Place 1 tablet (0.4 mg total) under the tongue every 5 (five) minutes as needed. At the onset of chest pain, Up to 3 doses  . NON FORMULARY C-Pap machine, uses nightly for sleep apnea  . pravastatin (PRAVACHOL) 20 MG tablet TAKE ONE TABLET BY MOUTH ONCE DAILY  . pravastatin (PRAVACHOL) 20 MG tablet TAKE 1 TABLET BY MOUTH ONCE DAILY  . Probiotic Product (ALIGN PO) Take 1 tablet by mouth daily.  . Psyllium (METAFIBER) 48.57 % POWD Take 10 mLs by mouth every evening. Mixed with water   No facility-administered encounter medications on file as of 05/17/2016.     Allergies as of 05/17/2016 - Review Complete 05/17/2016  Allergen Reaction Noted  . Sulfonamide derivatives Itching and Rash 03/01/2009    Past Medical History:  Diagnosis Date  . Anxiety    sleep  . Arthritis   . Automatic implantable cardioverter/defibrillator (AICD) activation    s/p implantable cardioverter-defibrillator bivenrticular pacer. 12/05. St, Jude Medtronictlas. remote-no.  [name]. CAD (coronary artery disease)    a. s/p prior anterior wall myocardial infarction and multiple percutaneous coronary intervention.;  b. Aden MV 1/14:  low risk, inf  and apical defect likely due to atten artifact, no ischemia, EF 35% (visually normal and est 55-60%)  . Chest pain 12/09   recent hospitalization for it. felt to be noncardiac.   [name] CHF (congestive heart failure) (HCC)   . DD (diverticular disease)    Hx of it - severe. Left colon 2004  . Esophageal stricture    last dialated 2007  . GERD (gastroesophageal reflux disease)   . Hiatal hernia   . HTN (hypertension)   . Hyperlipidemia   . Hypothyroidism   . Ischemic  cardiomyopathy    EF 25-30% improved to 45% with biventricular pacing.   [name] PVT (paroxysmal ventricular tachycardia) (HCC)   . Sleep apnea    has a Cpap machine  . Systolic heart failure    improved to class I to II   . UTI (lower urinary tract infection)    recurrent. (Klebsiella pneumoniae). Last Cx 02/03/09. (R-ancef, nitrofurantoin, Augmentin, Zyosyn)    Past Surgical History:  Procedure Laterality Date  . ANGIOPLASTY     stent  . APPENDECTOMY    . CARDIAC CATHETERIZATION     stent  . CARDIAC DEFIBRILLATOR PLACEMENT     St Jude Atlas  . CATARACT EXTRACTION, BILATERAL    . COLONOSCOPY W/ POLYPECTOMY    . EP IMPLANTABLE DEVICE N/A 12/05/2015   Procedure: ICD Generator Changeout;  Surgeon: Duke Salvia, MD;  Location: [facility] INVASIVE CV LAB;  Service: Cardiovascular;  Laterality: N/A;  . HAMMER TOE SURGERY Left 01/15/2013   Procedure: LEFT SECOND THROUGH FOURTH HAMMERTOE CORRECTION ;  Surgeon: Toni Arthurs, MD;  Location: [facility] OR;  Service: Orthopedics;  Laterality: Left;  . HAMMERTOE RECONSTRUCTION WITH WEIL OSTEOTOMY Right 07/17/2012   Procedure: RIGHT SECOND AND THIRD MT WEIL OSTEOTOMIES AND SECOND AND THIRD HAMMERTOE CORRECTIONS;  Surgeon: Toni Arthurs, MD;  Location: [facility] OR;  Service: Orthopedics;  Laterality: Right;  . INSERT / REPLACE / REMOVE PACEMAKER    . TONSILLECTOMY    . TOTAL KNEE ARTHROPLASTY     bilateral  . TUBAL LIGATION    . unspecified area hysterectomy    . WEIL OSTEOTOMY Left 01/15/2013   Procedure:  WEIL OSTEOTOMY AND DORSAL CAPSULOTOMY;  Surgeon: Toni Arthurs, MD;  Location: [facility] OR;  Service: Orthopedics;  Laterality: Left;    Family History  Problem Relation Age of Onset  . Heart disease Mother   . Heart disease Father   . Colon polyps Sister   . Heart disease Brother        multiple brothers  . Heart disease Sister        multiple sisters  . Prostate cancer Brother   . Diabetes Daughter   . Diabetes Unknown        aunt  . Breast cancer Daughter   .  Alcohol abuse Son   . Brain cancer Son   . Lung cancer Son   . Colon cancer Neg Hx     Social History   Social History  . Marital status: Widowed    Spouse name: N/A  . Number of children: 4  . Years of education: N/A   Occupational History  . Retired    Social History Main Topics  . Smoking status: Never Smoker  . Smokeless tobacco: Never Used  . Alcohol use No  . Drug use: No  . Sexual activity: Not on file   Other Topics Concern  . Not on file   Social History Narrative   Widowed, husband died of  brain tumor. Son lives in [place], [place]- was in a MVA for DWI. He has had a long hx of alcohol problems, she has helped him out financially in the past but is ready to be much stricter. Pt. Has a daughter who live in [place] with Breast Ca. She also lost a son to a brain tumor.       Review of systems: Review of Systems  Constitutional: Negative for fever and chills.  HENT: Difficulty with hearing Eyes: Negative for blurred vision.  Respiratory: Negative for cough, shortness of breath and wheezing.   Cardiovascular: Negative for chest pain and palpitations.  Gastrointestinal: as per HPI Genitourinary: Negative for dysuria, urgency, frequency and hematuria.  Musculoskeletal: Positive for myalgias, back pain and joint pain.  Skin: Negative for itching and rash.  Neurological: Negative for dizziness, tremors, focal weakness, seizures and loss of consciousness.  Endo/Heme/Allergies: Positive for seasonal allergies.  Psychiatric/Behavioral: Negative for depression, suicidal ideas and hallucinations.  positive for anxiety All other systems reviewed and are negative.   Physical Exam: Vitals:   05/17/16 1040  BP: 130/60  Pulse: 88   Body mass index is 26.96 kg/m. Gen:      No acute distress, walks with a walker HEENT:  EOMI, sclera anicteric Neck:     No masses; no thyromegaly Ext:    No edema; adequate peripheral perfusion Skin:      Warm and dry; no rash Neuro: alert and  oriented x 3 Psych: normal mood and affect  Data Reviewed:  Reviewed labs, radiology imaging, old records and pertinent past GI work up   Assessment and Plan/Recommendations:  83 year old female with history of CAD, ischemic cardiomyopathy, status post ICD, CHF, hiatal hernia, chronic GERD, recurrent distal esophageal stricture status post dilation, severe pan colonic diverticulosis here for follow-up visit status post recent hospitalization with rectal bleeding Patient was hemodynamically stable during the hospitalization and did not have a significant drop in hemoglobin Likely etiology internal hemorrhoid small volume hemorrhage No further bleeding episodes since discharge Hemoglobin has remained stable No further intervention at this point Follow-up as needed    K. Scherry Ran , MD [phone] Mon-Fri 8a-5p [phone] after 5p, weekends, holidays  CC: Saguier, Ramon Dredge, PA-C

## 2016-05-17 NOTE — Patient Instructions (Signed)
Follow up as needed

## 2016-05-24 ENCOUNTER — Encounter: Payer: Self-pay | Admitting: Internal Medicine

## 2016-06-06 ENCOUNTER — Other Ambulatory Visit: Payer: Self-pay | Admitting: Family Medicine

## 2016-06-25 ENCOUNTER — Other Ambulatory Visit: Payer: Self-pay | Admitting: Family Medicine

## 2016-07-05 ENCOUNTER — Ambulatory Visit (INDEPENDENT_AMBULATORY_CARE_PROVIDER_SITE_OTHER): Payer: Medicare Other | Admitting: *Deleted

## 2016-07-05 DIAGNOSIS — I255 Ischemic cardiomyopathy: Secondary | ICD-10-CM

## 2016-07-05 LAB — CUP PACEART REMOTE DEVICE CHECK
Battery Remaining Percentage: 91 %
Battery Voltage: 2.96 V
Brady Statistic AP VP Percent: 1.8 %
Brady Statistic AS VP Percent: 80 %
Date Time Interrogation Session: 20180628060014
Implantable Lead Implant Date: 20051206
Implantable Lead Implant Date: 20051206
Implantable Lead Location: 753859
Lead Channel Impedance Value: 680 Ohm
Lead Channel Pacing Threshold Amplitude: 0.75 V
Lead Channel Pacing Threshold Amplitude: 2.5 V
Lead Channel Pacing Threshold Pulse Width: 0.4 ms
Lead Channel Pacing Threshold Pulse Width: 0.4 ms
Lead Channel Pacing Threshold Pulse Width: 1.2 ms
Lead Channel Sensing Intrinsic Amplitude: 1.6 mV
Lead Channel Setting Pacing Amplitude: 2.5 V
Lead Channel Setting Pacing Amplitude: 2.5 V
Lead Channel Setting Pacing Pulse Width: 1.2 ms
Lead Channel Setting Sensing Sensitivity: 1.5 mV
MDC IDC LEAD IMPLANT DT: 20051206
MDC IDC LEAD LOCATION: 753858
MDC IDC LEAD LOCATION: 753860
MDC IDC MSMT BATTERY REMAINING LONGEVITY: 64 mo
MDC IDC MSMT LEADCHNL LV IMPEDANCE VALUE: 400 Ohm
MDC IDC MSMT LEADCHNL LV PACING THRESHOLD AMPLITUDE: 1.25 V
MDC IDC MSMT LEADCHNL RA IMPEDANCE VALUE: 430 Ohm
MDC IDC MSMT LEADCHNL RV SENSING INTR AMPL: 5.7 mV
MDC IDC PG IMPLANT DT: 20171127
MDC IDC SET LEADCHNL RA PACING AMPLITUDE: 2 V
MDC IDC SET LEADCHNL RV PACING PULSEWIDTH: 1 ms
MDC IDC STAT BRADY AP VS PERCENT: 1 %
MDC IDC STAT BRADY AS VS PERCENT: 9 %
MDC IDC STAT BRADY RA PERCENT PACED: 1 %
Pulse Gen Model: 3222
Pulse Gen Serial Number: 7855837

## 2016-07-05 NOTE — Progress Notes (Signed)
Remote ICD transmission.   

## 2016-07-06 ENCOUNTER — Encounter: Payer: Self-pay | Admitting: Cardiology

## 2016-07-18 ENCOUNTER — Other Ambulatory Visit: Payer: Self-pay | Admitting: Family Medicine

## 2016-07-27 ENCOUNTER — Inpatient Hospital Stay (HOSPITAL_COMMUNITY)
Admission: EM | Admit: 2016-07-27 | Discharge: 2016-07-30 | DRG: 690 | Disposition: A | Discharge status: Home / Self Care | Payer: Medicare Other | Attending: Nephrology | Admitting: Nephrology

## 2016-07-27 ENCOUNTER — Emergency Department (HOSPITAL_COMMUNITY): Payer: Medicare Other

## 2016-07-27 ENCOUNTER — Encounter (HOSPITAL_COMMUNITY): Payer: Self-pay | Admitting: *Deleted

## 2016-07-27 DIAGNOSIS — I252 Old myocardial infarction: Secondary | ICD-10-CM

## 2016-07-27 DIAGNOSIS — Z833 Family history of diabetes mellitus: Secondary | ICD-10-CM

## 2016-07-27 DIAGNOSIS — Z95810 Presence of automatic (implantable) cardiac defibrillator: Secondary | ICD-10-CM | POA: Diagnosis not present

## 2016-07-27 DIAGNOSIS — M25559 Pain in unspecified hip: Secondary | ICD-10-CM | POA: Diagnosis present

## 2016-07-27 DIAGNOSIS — R072 Precordial pain: Secondary | ICD-10-CM

## 2016-07-27 DIAGNOSIS — G4733 Obstructive sleep apnea (adult) (pediatric): Secondary | ICD-10-CM | POA: Diagnosis present

## 2016-07-27 DIAGNOSIS — I5023 Acute on chronic systolic (congestive) heart failure: Secondary | ICD-10-CM

## 2016-07-27 DIAGNOSIS — N1330 Unspecified hydronephrosis: Secondary | ICD-10-CM

## 2016-07-27 DIAGNOSIS — R0602 Shortness of breath: Secondary | ICD-10-CM

## 2016-07-27 DIAGNOSIS — Z66 Do not resuscitate: Secondary | ICD-10-CM | POA: Diagnosis present

## 2016-07-27 DIAGNOSIS — N136 Pyonephrosis: Secondary | ICD-10-CM | POA: Diagnosis not present

## 2016-07-27 DIAGNOSIS — R079 Chest pain, unspecified: Secondary | ICD-10-CM | POA: Diagnosis not present

## 2016-07-27 DIAGNOSIS — K219 Gastro-esophageal reflux disease without esophagitis: Secondary | ICD-10-CM | POA: Diagnosis present

## 2016-07-27 DIAGNOSIS — I2510 Atherosclerotic heart disease of native coronary artery without angina pectoris: Secondary | ICD-10-CM | POA: Diagnosis present

## 2016-07-27 DIAGNOSIS — M810 Age-related osteoporosis without current pathological fracture: Secondary | ICD-10-CM | POA: Diagnosis not present

## 2016-07-27 DIAGNOSIS — E785 Hyperlipidemia, unspecified: Secondary | ICD-10-CM | POA: Diagnosis present

## 2016-07-27 DIAGNOSIS — E038 Other specified hypothyroidism: Secondary | ICD-10-CM | POA: Diagnosis not present

## 2016-07-27 DIAGNOSIS — M549 Dorsalgia, unspecified: Secondary | ICD-10-CM | POA: Diagnosis present

## 2016-07-27 DIAGNOSIS — Z96653 Presence of artificial knee joint, bilateral: Secondary | ICD-10-CM | POA: Diagnosis present

## 2016-07-27 DIAGNOSIS — I5022 Chronic systolic (congestive) heart failure: Secondary | ICD-10-CM | POA: Diagnosis present

## 2016-07-27 DIAGNOSIS — I255 Ischemic cardiomyopathy: Secondary | ICD-10-CM | POA: Diagnosis present

## 2016-07-27 DIAGNOSIS — R0789 Other chest pain: Secondary | ICD-10-CM | POA: Diagnosis not present

## 2016-07-27 DIAGNOSIS — N390 Urinary tract infection, site not specified: Secondary | ICD-10-CM

## 2016-07-27 DIAGNOSIS — M545 Low back pain: Secondary | ICD-10-CM | POA: Diagnosis not present

## 2016-07-27 DIAGNOSIS — E039 Hypothyroidism, unspecified: Secondary | ICD-10-CM | POA: Diagnosis not present

## 2016-07-27 DIAGNOSIS — I110 Hypertensive heart disease with heart failure: Secondary | ICD-10-CM | POA: Diagnosis present

## 2016-07-27 DIAGNOSIS — Z950 Presence of cardiac pacemaker: Secondary | ICD-10-CM | POA: Diagnosis present

## 2016-07-27 DIAGNOSIS — B952 Enterococcus as the cause of diseases classified elsewhere: Secondary | ICD-10-CM

## 2016-07-27 DIAGNOSIS — Z8249 Family history of ischemic heart disease and other diseases of the circulatory system: Secondary | ICD-10-CM

## 2016-07-27 DIAGNOSIS — E440 Moderate protein-calorie malnutrition: Secondary | ICD-10-CM | POA: Insufficient documentation

## 2016-07-27 DIAGNOSIS — M25551 Pain in right hip: Secondary | ICD-10-CM | POA: Diagnosis not present

## 2016-07-27 DIAGNOSIS — Z6825 Body mass index (BMI) 25.0-25.9, adult: Secondary | ICD-10-CM

## 2016-07-27 LAB — COMPREHENSIVE METABOLIC PANEL
ALBUMIN: 4.5 g/dL (ref 3.5–5.0)
ALT: 17 U/L (ref 14–54)
ANION GAP: 8 (ref 5–15)
AST: 29 U/L (ref 15–41)
Alkaline Phosphatase: 72 U/L (ref 38–126)
BILIRUBIN TOTAL: 0.9 mg/dL (ref 0.3–1.2)
BUN: 14 mg/dL (ref 6–20)
CO2: 26 mmol/L (ref 22–32)
Calcium: 10.2 mg/dL (ref 8.9–10.3)
Chloride: 100 mmol/L — ABNORMAL LOW (ref 101–111)
Creatinine, Ser: 0.82 mg/dL (ref 0.44–1.00)
GFR calc Af Amer: >60 mL/min (ref >60)
GFR, EST NON AFRICAN AMERICAN: 60 mL/min — AB (ref >60)
Glucose, Bld: 133 mg/dL — ABNORMAL HIGH (ref 65–99)
POTASSIUM: 4.7 mmol/L (ref 3.5–5.1)
Sodium: 134 mmol/L — ABNORMAL LOW (ref 135–145)
TOTAL PROTEIN: 7.2 g/dL (ref 6.5–8.1)

## 2016-07-27 LAB — CBC WITH DIFFERENTIAL/PLATELET
BASOS ABS: 0 10*3/uL (ref 0.0–0.1)
Basophils Relative: 0 %
Eosinophils Absolute: 0.1 10*3/uL (ref 0.0–0.7)
Eosinophils Relative: 1 %
HEMATOCRIT: 41.6 % (ref 36.0–46.0)
Hemoglobin: 13.7 g/dL (ref 12.0–15.0)
LYMPHS PCT: 9 %
Lymphs Abs: 0.8 10*3/uL (ref 0.7–4.0)
MCH: 29.9 pg (ref 26.0–34.0)
MCHC: 32.9 g/dL (ref 30.0–36.0)
MCV: 90.8 fL (ref 78.0–100.0)
Monocytes Absolute: 0.5 10*3/uL (ref 0.1–1.0)
Monocytes Relative: 5 %
NEUTROS ABS: 7.9 10*3/uL — AB (ref 1.7–7.7)
NEUTROS PCT: 85 %
Platelets: 177 10*3/uL (ref 150–400)
RBC: 4.58 MIL/uL (ref 3.87–5.11)
RDW: 13 % (ref 11.5–15.5)
WBC: 9.3 10*3/uL (ref 4.0–10.5)

## 2016-07-27 LAB — URINALYSIS, ROUTINE W REFLEX MICROSCOPIC
Bacteria, UA: NONE SEEN
Bilirubin Urine: NEGATIVE
GLUCOSE, UA: NEGATIVE mg/dL
HGB URINE DIPSTICK: NEGATIVE
KETONES UR: NEGATIVE mg/dL
LEUKOCYTES UA: NEGATIVE
Nitrite: NEGATIVE
PH: 7 (ref 5.0–8.0)
PROTEIN: NEGATIVE mg/dL
Specific Gravity, Urine: 1.009 (ref 1.005–1.030)

## 2016-07-27 LAB — I-STAT TROPONIN, ED: Troponin i, poc: 0.01 ng/mL (ref 0.00–0.08)

## 2016-07-27 LAB — BRAIN NATRIURETIC PEPTIDE: B Natriuretic Peptide: 603.4 pg/mL — ABNORMAL HIGH (ref 0.0–100.0)

## 2016-07-27 MED ORDER — FUROSEMIDE 10 MG/ML IJ SOLN
20.0000 mg | INTRAMUSCULAR | Status: AC
  Administered 2016-07-27: 20 mg via INTRAVENOUS
  Filled 2016-07-27: qty 2

## 2016-07-27 NOTE — ED Notes (Signed)
The pt just returned from xray 

## 2016-07-27 NOTE — ED Notes (Signed)
Alert talking wit family  C.o rt hip pain

## 2016-07-27 NOTE — ED Provider Notes (Signed)
[facility]-EMERGENCY DEPT Provider Note   CSN: [id number] Arrival date & time: 07/27/16  2019     History   Chief Complaint Chief Complaint  Patient presents with  . Generalized Body Aches    HPI [name] is a 83 y.o. female.  [name] is a 83 y.o. Female with a history of coronary artery disease, systolic heart failure, St Jude defib/ pacemaker, who presents to the emergency department complaining of feeling fatigued, increasing shortness of breath and chest pain ongoing for the past 3 days. She also reports she feels like her legs are slightly more swollen than usual. She reports she's had a gradual onset of increased shortness of breath over the past 3 days as well as intermittent chest pain. Currently she reports some mild chest pain in the center of her chest pain she also reports some right low back pain that is worse with movement. She complains of some mild bilateral lower extremity edema. She reports seeing Lasix intermittently. She received 324 mg of aspirin by EMS prior to arrival to the emergency department. She denies increased cough, fevers, abdominal pain, vomiting, diarrhea, rashes, calf pain, urinary symptoms or abdominal pain.   The history is provided by the patient and medical records. No language interpreter was used.    Past Medical History:  Diagnosis Date  . Anxiety    sleep  . Arthritis   . Automatic implantable cardioverter/defibrillator (AICD) activation    s/p implantable cardioverter-defibrillator bivenrticular pacer. 12/05. St, Jude [employer]. remote-no.  [name] CAD (coronary artery disease)    a. s/p prior anterior wall myocardial infarction and multiple percutaneous coronary intervention.;  b. Aden MV 1/14:  low risk, inf and apical defect likely due to atten artifact, no ischemia, EF 35% (visually normal and est 55-60%)  . Chest pain 12/09   recent hospitalization for it. felt to be noncardiac.   [name] CHF (congestive heart failure) (HCC)   . DD  (diverticular disease)    Hx of it - severe. Left colon 2004  . Esophageal stricture    last dialated 2007  . GERD (gastroesophageal reflux disease)   . Hiatal hernia   . HTN (hypertension)   . Hyperlipidemia   . Hypothyroidism   . Ischemic cardiomyopathy    EF 25-30% improved to 45% with biventricular pacing.   [name] PVT (paroxysmal ventricular tachycardia) (HCC)   . Sleep apnea    has a Cpap machine  . Systolic heart failure    improved to class I to II   . UTI (lower urinary tract infection)    recurrent. (Klebsiella pneumoniae). Last Cx 02/03/09. (R-ancef, nitrofurantoin, Augmentin, Zyosyn)    Patient Active Problem List   Diagnosis Date Noted  . Chest pressure 07/27/2016  . Rectal bleeding 04/09/2016  . CAD (coronary artery disease)   . OSA (obstructive sleep apnea) 10/01/2014  . Nocturnal hypoxia 08/27/2014  . Olecranon bursitis of right elbow 08/13/2014  . Dyspnea 04/22/2014  . Physical exam 02/18/2014  . Headache(784.0) 09/11/2013  . Foot abrasion 09/02/2013  . Skin infection 08/19/2013  . Injury to upper leg 08/05/2013  . Need for prophylactic vaccination with tetanus-diphtheria (Td) 08/05/2013  . Hematoma of leg 08/05/2013  . Acute bronchitis 03/19/2013  . Post concussive syndrome 03/08/2013  . Cough 03/06/2013  . Tick bite of left side of sternum 06/04/2012  . Hip pain 03/06/2012  . Hammer toe 03/06/2012  . Anxiety 01/25/2012  . Hives 01/25/2012  . Back pain 01/25/2012  . Cervical dystonia  12/19/2011  . Sinusitis 12/17/2011  . Tremor 12/17/2011  . Osteoporosis 08/07/2011  . Nasal sore 07/02/2011  . Edema 05/24/2011  . Fatigue 05/24/2011  . Seasonal allergic rhinitis 05/24/2011  . Foot joint pain 04/26/2011  . Memory changes 04/16/2011  . Headache causing frequent awakening from sleep 04/16/2011  . Constipation, slow transit 04/16/2011  . Hypothyroid 02/06/2011  . Biventricular implantable cardioverter-defibrillator in situ 07/25/2010  . St. Jude Riata  lead-series of 1500 07/25/2010  . Chest pain 07/25/2010  . Lightheadedness 07/25/2010  . Leg pain 07/25/2010  . PAROXYSMAL VENTRICULAR TACHYCARDIA 11/14/2009  . HYPOTENSION, ORTHOSTATIC, HX OF 04/11/2009  . Chronic cystitis 02/25/2009  . UTI'S, RECURRENT 02/25/2009  . FEMALE STRESS INCONTINENCE 02/25/2009  . POSTMENOPAUSAL ATROPHIC VAGINITIS 02/25/2009  . HEARTBURN 02/25/2009  . PERSONAL HISTORY OF ARTHRITIS 02/25/2009  . Other acquired absence of organ 02/25/2009  . TOTAL KNEE REPLACEMENT, HX OF 02/25/2009  . ESOPHAGEAL STRICTURE 09/03/2008  . WEIGHT LOSS-ABNORMAL 09/03/2008  . NAUSEA 09/03/2008  . ABDOMINAL PAIN -GENERALIZED 09/03/2008  . Hyperlipidemia 10/22/2007  . CARDIOMYOPATHY, ISCHEMIC 10/22/2007  . Chronic systolic heart failure (HCC) 10/22/2007    Past Surgical History:  Procedure Laterality Date  . ANGIOPLASTY     stent  . APPENDECTOMY    . CARDIAC CATHETERIZATION     stent  . CARDIAC DEFIBRILLATOR PLACEMENT     St Jude Atlas  . CATARACT EXTRACTION, BILATERAL    . COLONOSCOPY W/ POLYPECTOMY    . EP IMPLANTABLE DEVICE N/A 12/05/2015   Procedure: ICD Generator Changeout;  Surgeon: Duke Salvia, MD;  Location: [facility] INVASIVE CV LAB;  Service: Cardiovascular;  Laterality: N/A;  . HAMMER TOE SURGERY Left 01/15/2013   Procedure: LEFT SECOND THROUGH FOURTH HAMMERTOE CORRECTION ;  Surgeon: Toni Arthurs, MD;  Location: [facility] OR;  Service: Orthopedics;  Laterality: Left;  . HAMMERTOE RECONSTRUCTION WITH WEIL OSTEOTOMY Right 07/17/2012   Procedure: RIGHT SECOND AND THIRD MT WEIL OSTEOTOMIES AND SECOND AND THIRD HAMMERTOE CORRECTIONS;  Surgeon: Toni Arthurs, MD;  Location: [facility] OR;  Service: Orthopedics;  Laterality: Right;  . INSERT / REPLACE / REMOVE PACEMAKER    . TONSILLECTOMY    . TOTAL KNEE ARTHROPLASTY     bilateral  . TUBAL LIGATION    . unspecified area hysterectomy    . WEIL OSTEOTOMY Left 01/15/2013   Procedure:  WEIL OSTEOTOMY AND DORSAL CAPSULOTOMY;  Surgeon: Toni Arthurs, MD;  Location: [facility] OR;  Service: Orthopedics;  Laterality: Left;    OB History    No data available       Home Medications    Prior to Admission medications   Medication Sig Start Date End Date Taking? Authorizing Provider  albuterol (PROVENTIL HFA;VENTOLIN HFA) 108 (90 Base) MCG/ACT inhaler Inhale 2 puffs into the lungs every 6 (six) hours as needed for wheezing or shortness of breath. 08/18/15  Yes Coralyn Helling, MD  aspirin (ASPIR-81) 81 MG EC tablet Take 81 mg by mouth daily.     Yes [provider]  beclomethasone (QVAR) 40 MCG/ACT inhaler Inhale 2 puffs into the lungs 2 (two) times daily. Patient taking differently: Inhale 2 puffs into the lungs 2 (two) times daily as needed (shortness of breath).  02/18/15  Yes Saguier, Ramon Dredge, PA-C  diphenhydrAMINE (BENADRYL) 25 mg capsule Take 25 mg by mouth every 6 (six) hours as needed for itching or allergies.   Yes [provider]  furosemide (LASIX) 20 MG tablet Take 1 tablet (20 mg total) by mouth daily as needed  for fluid or edema. Patient taking differently: Take 20 mg by mouth 2 (two) times a week.  06/29/15  Yes Duke SalviaKlein, Steven C, MD  isosorbide mononitrate (IMDUR) 30 MG 24 hr tablet TAKE ONE & ONE-HALF TABLETS BY MOUTH ONCE DAILY IN THE MORNING 05/04/16  Yes Seabron SpatesLowne Chase, Yvonne R, DO  levothyroxine (SYNTHROID, LEVOTHROID) 100 MCG tablet TAKE 1 TABLET BY MOUTH ONCE DAILY BEFORE BREAKFAST 07/19/16  Yes Lowne Chase, Yvonne R, DO  Liniments (SALONPAS PAIN RELIEF PATCH EX) Apply 1 patch topically daily as needed (pain).    Yes [provider]  losartan (COZAAR) 50 MG tablet TAKE 1 TABLET BY MOUTH ONCE DAILY (YOU NEED APPOINTMENT FOR MORE REFILLS) 06/25/16  Yes Seabron SpatesLowne Chase, Yvonne R, DO  metoprolol succinate (TOPROL-XL) 50 MG 24 hr tablet TAKE ONE TABLET BY MOUTH TWICE DAILY WITH  OR  IMMEDIATELY  FOLLOWING  A  MEAL Patient taking differently: TAKE1/2 TABLET BY MOUTH TWICE DAILY WITH  OR  IMMEDIATELY  FOLLOWING  A  MEAL  09/29/15  Yes Seabron SpatesLowne Chase, Yvonne R, DO  nitroGLYCERIN (NITROSTAT) 0.4 MG SL tablet Place 1 tablet (0.4 mg total) under the tongue every 5 (five) minutes as needed. At the onset of chest pain, Up to 3 doses 01/31/16  Yes Duke SalviaKlein, Steven C, MD  NON FORMULARY C-Pap machine, uses nightly for sleep apnea   Yes [provider]  pravastatin (PRAVACHOL) 20 MG tablet TAKE ONE TABLET BY MOUTH ONCE DAILY 08/23/15  Yes Seabron SpatesLowne Chase, Yvonne R, DO  Psyllium (METAFIBER) 48.57 % POWD Take 10 mLs by mouth every evening. Mixed with water   Yes [provider]    Family History Family History  Problem Relation Age of Onset  . Heart disease Mother   . Heart disease Father   . Colon polyps Sister   . Heart disease Brother        multiple brothers  . Heart disease Sister        multiple sisters  . Prostate cancer Brother   . Diabetes Daughter   . Diabetes Unknown        aunt  . Breast cancer Daughter   . Alcohol abuse Son   . Brain cancer Son   . Lung cancer Son   . Colon cancer Neg Hx     Social History Social History  Substance Use Topics  . Smoking status: Never Smoker  . Smokeless tobacco: Never Used  . Alcohol use No     Allergies   Sulfonamide derivatives   Review of Systems Review of Systems  Constitutional: Positive for fatigue. Negative for chills and fever.  HENT: Negative for congestion and sore throat.   Eyes: Negative for visual disturbance.  Respiratory: Positive for shortness of breath. Negative for cough and wheezing.   Cardiovascular: Positive for chest pain and leg swelling. Negative for palpitations.  Gastrointestinal: Negative for abdominal pain, diarrhea, nausea and vomiting.  Genitourinary: Negative for dysuria.  Musculoskeletal: Positive for back pain. Negative for neck pain.  Skin: Negative for rash.  Neurological: Negative for syncope, weakness, light-headedness and headaches.     Physical Exam Updated Vital Signs BP (!) 141/79   Pulse 78    Temp 98 F (36.7 C) (Oral)   Resp (!) 24   Ht 4\' 10"  (1.473 m)   Wt 54.9 kg (121 lb)   SpO2 96%   BMI 25.29 kg/m   Physical Exam  Constitutional: She is oriented to person, place, and time. She appears well-developed and well-nourished.  No distress.  Nontoxic appearing.  HENT:  Head: Normocephalic and atraumatic.  Mouth/Throat: Oropharynx is clear and moist.  Eyes: Pupils are equal, round, and reactive to light. Conjunctivae are normal. Right eye exhibits no discharge. Left eye exhibits no discharge.  Neck: Neck supple. No JVD present.  Cardiovascular: Normal rate, regular rhythm, normal heart sounds and intact distal pulses.  Exam reveals no gallop and no friction rub.   No murmur heard. Pulmonary/Chest: Effort normal and breath sounds normal. No respiratory distress. She has no wheezes.  Faint crackles noted to bilateral bases. No increased work of breathing.  Abdominal: Soft. There is no tenderness.  Musculoskeletal: Normal range of motion. She exhibits edema. She exhibits no tenderness.  Mild bilateral ankle edema noted. No calf edema or tenderness. Right lateral low back is TTP. No midline neck or back TTP. No back erythema, or ecchymosis. No chest wall TTP.   Lymphadenopathy:    She has no cervical adenopathy.  Neurological: She is alert and oriented to person, place, and time. No sensory deficit. Coordination normal.  Skin: Skin is warm and dry. Capillary refill takes less than 2 seconds. No rash noted. She is not diaphoretic. No erythema. No pallor.  Psychiatric: She has a normal mood and affect. Her behavior is normal.  Nursing note and vitals reviewed.    ED Treatments / Results  Labs (all labs ordered are listed, but only abnormal results are displayed) Labs Reviewed  COMPREHENSIVE METABOLIC PANEL - Abnormal; Notable for the following:       Result Value   Sodium 134 (*)    Chloride 100 (*)    Glucose, Bld 133 (*)    GFR calc non Af Amer 60 (*)    All other  components within normal limits  CBC WITH DIFFERENTIAL/PLATELET - Abnormal; Notable for the following:    Neutro Abs 7.9 (*)    All other components within normal limits  BRAIN NATRIURETIC PEPTIDE - Abnormal; Notable for the following:    B Natriuretic Peptide 603.4 (*)    All other components within normal limits  URINALYSIS, ROUTINE W REFLEX MICROSCOPIC - Abnormal; Notable for the following:    Color, Urine STRAW (*)    Squamous Epithelial / LPF 0-5 (*)    All other components within normal limits  URINE CULTURE  I-STAT TROPONIN, ED    EKG  EKG Interpretation  Date/Time:  Friday July 27 2016 20:41:55 EDT Ventricular Rate:  81 PR Interval:    QRS Duration: 152 QT Interval:  462 QTC Calculation: 530 R Axis:   151 Text Interpretation:  Sinus rhythm Ventricular bigeminy Right bundle branch block Confirmed by Donnetta Hutching ([id number]) on 07/27/2016 10:09:37 PM       Radiology Dg Chest 2 View  Result Date: 07/27/2016 CLINICAL DATA:  Initial evaluation for acute chest pain. EXAM: CHEST  2 VIEW COMPARISON:  Prior radiograph from 04/10/2016. FINDINGS: Left-sided pacemaker/ AICD in place, stable. Stable cardiomegaly. Mediastinal silhouette normal. Aortic atherosclerosis. Moderate to large hiatal hernia. Elevation of the right hemidiaphragm, similar to prior, likely chronic. Associated right basilar atelectasis. Scattered linear density at the left lung base also consistent with atelectasis. No focal infiltrates. No pulmonary edema or pleural effusion. No pneumothorax. No acute osseus abnormality. IMPRESSION: 1. Chronic elevation of the right hemidiaphragm and moderate hiatal hernia with associated bibasilar atelectasis, similar to previous. 2. No other active cardiopulmonary disease. 3. Stable cardiomegaly without pulmonary edema. 4. Aortic atherosclerosis. Electronically Signed   By: Janell Quiet.D.  On: 07/27/2016 22:05    Procedures Procedures (including critical care  time)  Medications Ordered in ED Medications  furosemide (LASIX) injection 20 mg (20 mg Intravenous Given 07/27/16 2329)     Initial Impression / Assessment and Plan / ED Course  I have reviewed the triage vital signs and the nursing notes.  Pertinent labs & imaging results that were available during my care of the patient were reviewed by me and considered in my medical decision making (see chart for details).     This is a 83 y.o. Female with a history of coronary artery disease, systolic heart failure, St Jude defib/ pacemaker, who presents to the emergency department complaining of feeling fatigued, increasing shortness of breath and chest pain ongoing for the past 3 days. She also reports she feels like her legs are slightly more swollen than usual. She reports she's had a gradual onset of increased shortness of breath over the past 3 days as well as intermittent chest pain. Currently she reports some mild chest pain in the center of her chest pain she also reports some right low back pain that is worse with movement. She complains of some mild bilateral lower extremity edema. She reports seeing Lasix intermittently. She received 324 mg of aspirin by EMS prior to arrival to the emergency department. She denies increased cough, fevers. On exam the patient is afebrile nontoxic appearing. She has slight crackles noted to her bilateral bases. No increased work of breathing. No rhonchi or wheezing. Mild bilateral lower extremity edema without calf edema or tenderness. No evidence of STEMI on EKG. Troponin is not elevated. Urinalysis without signs of infection. CMP shows preserved kidney function. CBC shows no leukocytosis. BNP is elevated at 603. Chest x-ray shows chronic elevation of the right hemidiaphragm with associated bibasilar atelectasis similar to previous, and no active cardiopulmonary disease. Stable cardiomegaly without edema on x-ray. Clinically patient does have some crackles and BNP is  elevated. She is Lasix intermittently. Suspect this is related to CHF exacerbation. Will provide with 20 of IV Lasix and plan for admission to medicine. Patient and family agree with plan.  I consulted with [facility] hospitalist service who accepted the patient for admission.   This patient was discussed with and evaluated by Dr. Adriana Simas who agrees with assessment and plan.   Pacemaker rep called and reports device looks fine and no episodes or arrhythmias noted. Full report will be faxed to the facility.    Final Clinical Impressions(s) / ED Diagnoses   Final diagnoses:  Acute on chronic systolic congestive heart failure (HCC)  SOB (shortness of breath)  Precordial pain    New Prescriptions New Prescriptions   No medications on file     Lorene Dy 07/28/16 0005    Donnetta Hutching, MD 07/28/16 [phone]

## 2016-07-27 NOTE — ED Notes (Signed)
Pur-wick applied after  Iv lasix

## 2016-07-27 NOTE — ED Triage Notes (Signed)
The pt is c/o rt flank rt hip  For 2 days  She is also c/o some chest pain earlier today she took one sl nitro and 324mg  of aspirin  With some jaw pain.  She has a pacemaker  Her third one.  zofran given by [facility] ems

## 2016-07-27 NOTE — ED Notes (Signed)
To x-ray

## 2016-07-27 NOTE — ED Notes (Signed)
Admitting doctor has been  In to see

## 2016-07-27 NOTE — H&P (Signed)
History and Physical    [name]:[id number] DOB: 06/21/1921 DOA: 07/27/2016  PCP: Donato Schultz, DO  Patient coming from: Home   Chief Complaint: Chest pressure, SOB  HPI: [name] is a 83 y.o. female with medical history significant of ICM with systolic CHF (last TTE in our system was in 2008, EF of 40%), pacemaker in place, OSA on CPAP, hypothyroidism, HTN, HLD, GERD, CAD who presents for weakness and chest tightness.  She also told the EDP that she was having worsening SOB, but denied this to me.  SHe reports that she has been getting progressively weaker.  She has been having intermittent chest tightness that is sometimes pain, substernal to epigastric without radiation.  She cannot further characterize the pain.  It is exertional, but she often ignores it.  She has been having worsening swelling off an on, and has increased her lasix use from PRN to twice weekly.  She notes her left leg swells worse than her right.  She has been having increased mucus and wheezing, which sometimes wakes her from sleep.  She further notes 2 pillow sleeping which seems to have slowly been worsening as well.  She uses her CPAP at night.  She has been losing weight, about 23 # in the last couple of years.  She has had decreased interest in food and decreased PO intake.  Despite all of this, [name]'s main concern is some back pain which has been going on for the past 5 days.  She has had no injury, no increased activity and has in fact decreased her activity.  She reports a history of missed pyelonephritis in the past, and she is certain she has that again.  She has right sided low back pain which radiates into her lateral thigh and groin somewhat.  She has had no trauma, but the pain has been severe to the point that she had had nausea.  No emesis.  She also has osteoporosis and is on Prolia. Daughter and granddaughter in law were present with her.  Granddaughter in Social worker, [name], is her  daily caretaker.    ED Course: In the ED, she was found to have a mildly low Na of 134, BNP of 603.4.  TnI 0.01.  Her H/H were normal.  She had a CXR Which showed atelectasis and chronic issues without fluid, she had an EKG which showed PVCs and pacer spikes.    Review of Systems: As per HPI otherwise 10 point review of systems negative.  Specifically denies dysuria, change in urinary frequency, change in smell of urine.    Past Medical History:  Diagnosis Date  . Anxiety    sleep  . Arthritis   . Automatic implantable cardioverter/defibrillator (AICD) activation    s/p implantable cardioverter-defibrillator bivenrticular pacer. 12/05. St, Jude [employer]. remote-no.  [name] CAD (coronary artery disease)    a. s/p prior anterior wall myocardial infarction and multiple percutaneous coronary intervention.;  b. [place] MV 1/14:  low risk, inf and apical defect likely due to atten artifact, no ischemia, EF 35% (visually normal and est 55-60%)  . Chest pain 12/09   recent hospitalization for it. felt to be noncardiac.   [name] CHF (congestive heart failure) (HCC)   . DD (diverticular disease)    Hx of it - severe. Left colon 2004  . Esophageal stricture    last dialated 2007  . GERD (gastroesophageal reflux disease)   . Hiatal hernia   . HTN (hypertension)   .  Hyperlipidemia   . Hypothyroidism   . Ischemic cardiomyopathy    EF 25-30% improved to 45% with biventricular pacing.   [name]. PVT (paroxysmal ventricular tachycardia) (HCC)   . Sleep apnea    has a Cpap machine  . Systolic heart failure    improved to class I to II   . UTI (lower urinary tract infection)    recurrent. (Klebsiella pneumoniae). Last Cx 02/03/09. (R-ancef, nitrofurantoin, Augmentin, Zyosyn)    Past Surgical History:  Procedure Laterality Date  . ANGIOPLASTY     stent  . APPENDECTOMY    . CARDIAC CATHETERIZATION     stent  . CARDIAC DEFIBRILLATOR PLACEMENT     St Jude Atlas  . CATARACT EXTRACTION, BILATERAL    . COLONOSCOPY W/  POLYPECTOMY    . EP IMPLANTABLE DEVICE N/A 12/05/2015   Procedure: ICD Generator Changeout;  Surgeon: Duke SalviaSteven C Klein, MD;  [facility];  Service: Cardiovascular;  Laterality: N/A;  . HAMMER TOE SURGERY Left 01/15/2013   Procedure: LEFT SECOND THROUGH FOURTH HAMMERTOE CORRECTION ;  Surgeon: Toni ArthursJohn Hewitt, MD;  Location: [facility] OR;  Service: Orthopedics;  Laterality: Left;  . HAMMERTOE RECONSTRUCTION WITH WEIL OSTEOTOMY Right 07/17/2012   Procedure: RIGHT SECOND AND THIRD MT WEIL OSTEOTOMIES AND SECOND AND THIRD HAMMERTOE CORRECTIONS;  Surgeon: Toni ArthursJohn Hewitt, MD;  Location: [facility] OR;  Service: Orthopedics;  Laterality: Right;  . INSERT / REPLACE / REMOVE PACEMAKER    . TONSILLECTOMY    . TOTAL KNEE ARTHROPLASTY     bilateral  . TUBAL LIGATION    . unspecified area hysterectomy    . WEIL OSTEOTOMY Left 01/15/2013   Procedure:  WEIL OSTEOTOMY AND DORSAL CAPSULOTOMY;  Surgeon: Toni ArthursJohn Hewitt, MD;  Location: [facility] OR;  Service: Orthopedics;  Laterality: Left;   Reviewed with patient  reports that she has never smoked. She has never used smokeless tobacco. She reports that she does not drink alcohol or use drugs.  Allergies  Allergen Reactions  . Sulfonamide Derivatives Itching and Rash   Reviewed.  Family History  Problem Relation Age of Onset  . Heart disease Mother   . Heart disease Father   . Colon polyps Sister   . Heart disease Brother        multiple brothers  . Heart disease Sister        multiple sisters  . Prostate cancer Brother   . Diabetes Daughter   . Diabetes Unknown        aunt  . Breast cancer Daughter   . Alcohol abuse Son   . Brain cancer Son   . Lung cancer Son   . Colon cancer Neg Hx     Prior to Admission medications   Medication Sig Start Date End Date Taking? Authorizing Provider  albuterol (PROVENTIL HFA;VENTOLIN HFA) 108 (90 Base) MCG/ACT inhaler Inhale 2 puffs into the lungs every 6 (six) hours as needed for wheezing or shortness of breath. 08/18/15   Coralyn HellingSood,  Vineet, MD  aspirin (ASPIR-81) 81 MG EC tablet Take 81 mg by mouth daily.      [provider]  beclomethasone (QVAR) 40 MCG/ACT inhaler Inhale 2 puffs into the lungs 2 (two) times daily. 02/18/15   Saguier, Ramon DredgeEdward, PA-C  diphenhydrAMINE (BENADRYL) 25 mg capsule Take 25 mg by mouth every 6 (six) hours as needed for itching or allergies.    [provider]  furosemide (LASIX) 20 MG tablet Take 1 tablet (20 mg total) by mouth daily as needed for  fluid or edema. 06/29/15   Duke Salvia, MD  isosorbide mononitrate (IMDUR) 30 MG 24 hr tablet TAKE ONE & ONE-HALF TABLETS BY MOUTH ONCE DAILY IN THE MORNING 05/04/16   Zola Button, Grayling Congress, DO  levothyroxine (SYNTHROID, LEVOTHROID) 100 MCG tablet TAKE 1 TABLET BY MOUTH ONCE DAILY BEFORE BREAKFAST 07/19/16   Zola Button, Grayling Congress, DO  Liniments (SALONPAS PAIN RELIEF PATCH EX) Apply 1 patch topically daily as needed (pain).     [provider]  losartan (COZAAR) 50 MG tablet TAKE 1 TABLET BY MOUTH ONCE DAILY (YOU NEED APPOINTMENT FOR MORE REFILLS) 06/25/16   Zola Button, Grayling Congress, DO  metoprolol succinate (TOPROL-XL) 50 MG 24 hr tablet TAKE ONE TABLET BY MOUTH TWICE DAILY WITH  OR  IMMEDIATELY  FOLLOWING  A  MEAL Patient taking differently: TAKE ONE TABLET BY MOUTH DAILY WITH  OR  IMMEDIATELY  FOLLOWING  A  MEAL 09/29/15   Zola Button, Grayling Congress, DO  nitroGLYCERIN (NITROSTAT) 0.4 MG SL tablet Place 1 tablet (0.4 mg total) under the tongue every 5 (five) minutes as needed. At the onset of chest pain, Up to 3 doses 01/31/16   Duke Salvia, MD  NON FORMULARY C-Pap machine, uses nightly for sleep apnea    [provider]  pravastatin (PRAVACHOL) 20 MG tablet TAKE ONE TABLET BY MOUTH ONCE DAILY 08/23/15   Zola Button, Myrene Buddy R, DO  pravastatin (PRAVACHOL) 20 MG tablet TAKE 1 TABLET BY MOUTH ONCE DAILY 05/04/16   Donato Schultz, DO  Probiotic Product (ALIGN PO) Take 1 tablet by mouth daily.    [provider]  Psyllium  (METAFIBER) 48.57 % POWD Take 10 mLs by mouth every evening. Mixed with water    [provider]    Physical Exam: Vitals:   07/27/16 2039 07/27/16 2043 07/27/16 2115 07/27/16 2322  BP:   130/66 (!) 141/79  Pulse:   (!) 28 78  Resp:   (!) 27 (!) 24  Temp:  98 F (36.7 C)    TempSrc:  Oral    SpO2:   94% 96%  Weight: 121 lb (54.9 kg)     Height: 4\' 10"  (1.473 m)       Constitutional: NAD, calm, comfortable, elderly woman Vitals:   07/27/16 2039 07/27/16 2043 07/27/16 2115 07/27/16 2322  BP:   130/66 (!) 141/79  Pulse:   (!) 28 78  Resp:   (!) 27 (!) 24  Temp:  98 F (36.7 C)    TempSrc:  Oral    SpO2:   94% 96%  Weight: 121 lb (54.9 kg)     Height: 4\' 10"  (1.473 m)      Eyes:  lids and conjunctivae normal, anicteric sclerae ENMT: Mucous membranes are moist. Posterior pharynx clear of any exudate or lesions. Neck: normal, supple Respiratory: clear to auscultation bilaterally with some mild crackles at bases. Normal respiratory effort. No accessory muscle use.  Cardiovascular: Regular rate and rhythm, no murmurs / rubs / gallops. Very minimal lower extremity edema on the right, 1+ on the left to the ankles. Varicosities pervasive on both legs.  2+ pedal pulses. No carotid bruits.  Abdomen: no tenderness, no masses palpated. Bowel sounds positive.  Musculoskeletal: no clubbing / cyanosis.Normal muscle tone. NO CVA tenderness.  She had pain in the low back around the sacrum on the right and in the groin on the right.  Skin: no rashes, lesions, ulcers.  Neurologic: Moving all extremities, sensation grossly  intact Psychiatric: Normal judgment and insight. Alert and oriented x 3. Normal mood.   Labs on Admission: I have personally reviewed following labs and imaging studies  CBC:  Recent Labs Lab 07/27/16 2041  WBC 9.3  NEUTROABS 7.9*  HGB 13.7  HCT 41.6  MCV 90.8  PLT 177   Basic Metabolic Panel:  Recent Labs Lab 07/27/16 2041  NA 134*  K 4.7  CL 100*    CO2 26  GLUCOSE 133*  BUN 14  CREATININE 0.82  CALCIUM 10.2   GFR: Estimated Creatinine Clearance: 31.5 mL/min (by C-G formula based on SCr of 0.82 mg/dL). Liver Function Tests:  Recent Labs Lab 07/27/16 2041  AST 29  ALT 17  ALKPHOS 72  BILITOT 0.9  PROT 7.2  ALBUMIN 4.5   No results for input(s): LIPASE, AMYLASE in the last 168 hours. No results for input(s): AMMONIA in the last 168 hours. Coagulation Profile: No results for input(s): INR, PROTIME in the last 168 hours. Cardiac Enzymes: No results for input(s): CKTOTAL, CKMB, CKMBINDEX, TROPONINI in the last 168 hours. BNP (last 3 results) No results for input(s): PROBNP in the last 8760 hours. HbA1C: No results for input(s): HGBA1C in the last 72 hours. CBG: No results for input(s): GLUCAP in the last 168 hours. Lipid Profile: No results for input(s): CHOL, HDL, LDLCALC, TRIG, CHOLHDL, LDLDIRECT in the last 72 hours. Thyroid Function Tests: No results for input(s): TSH, T4TOTAL, FREET4, T3FREE, THYROIDAB in the last 72 hours. Anemia Panel: No results for input(s): VITAMINB12, FOLATE, FERRITIN, TIBC, IRON, RETICCTPCT in the last 72 hours. Urine analysis:    Component Value Date/Time   COLORURINE STRAW (A) 07/27/2016 2125   APPEARANCEUR CLEAR 07/27/2016 2125   LABSPEC 1.009 07/27/2016 2125   PHURINE 7.0 07/27/2016 2125   GLUCOSEU NEGATIVE 07/27/2016 2125   HGBUR NEGATIVE 07/27/2016 2125   BILIRUBINUR NEGATIVE 07/27/2016 2125   BILIRUBINUR neg 09/20/2015 1708   KETONESUR NEGATIVE 07/27/2016 2125   PROTEINUR NEGATIVE 07/27/2016 2125   UROBILINOGEN 0.2 09/20/2015 1708   UROBILINOGEN 0.2 04/01/2010 1425   NITRITE NEGATIVE 07/27/2016 2125   LEUKOCYTESUR NEGATIVE 07/27/2016 2125    Radiological Exams on Admission: Dg Chest 2 View  Result Date: 07/27/2016 CLINICAL DATA:  Initial evaluation for acute chest pain. EXAM: CHEST  2 VIEW COMPARISON:  Prior radiograph from 04/10/2016. FINDINGS: Left-sided pacemaker/  AICD in place, stable. Stable cardiomegaly. Mediastinal silhouette normal. Aortic atherosclerosis. Moderate to large hiatal hernia. Elevation of the right hemidiaphragm, similar to prior, likely chronic. Associated right basilar atelectasis. Scattered linear density at the left lung base also consistent with atelectasis. No focal infiltrates. No pulmonary edema or pleural effusion. No pneumothorax. No acute osseus abnormality. IMPRESSION: 1. Chronic elevation of the right hemidiaphragm and moderate hiatal hernia with associated bibasilar atelectasis, similar to previous. 2. No other active cardiopulmonary disease. 3. Stable cardiomegaly without pulmonary edema. 4. Aortic atherosclerosis. Electronically Signed   By: Rise Mu M.D.   On: 07/27/2016 22:05    EKG: Independently reviewed. RBBB with PVCs and pacer spikes.  No obvious changes apart from PVCs  Assessment/Plan Chest pressure - Ms. Podoll downplays her symptoms signficantly.  Given her history, will plan to rule out for ACS - First Trop is negative - EKG appears mostly unchanged, check Mag given new PVCs - Interrogation of pacemaker attempted in the ED - Telemetry - Trend Trop - AM EKG - Nitro for pain - Continue home imdur  Chronic systolic heart failure - I am not convinced she  has an acute exacerbation, she has mild worsening of symptoms, mild edema - BNP is elevated - IV lasix 20mg  given in ED, not continued.  Assess volume status daily.  - Strict I/O, daily weights - Heart Healthy diet - Repeat TTE tomorrow  Back pain and Hip pain - This is Ms. Camargo's most pressing concern.  She is worried she has a kidney infection again, as it has been missed before. - UA is completely normal, at her request have added on a UC - I am most concerned because on exam she was having pain around her hip, in the groin and low back, she had no CVA tenderness - right Hip Xrays - She has OP and is on Prolia, she could have had a  pathological fracture - Bed rest with bathroom privileges.  - Zofran for nausea - Tramadol for pain, avoid oversedation    Hyperlipidemia - Continue pravastatin    Biventricular implantable cardioverter-defibrillator in situ - Interrogation planned, if any non functioning will need to get Cardiology involved   Hypothyroid - On synthroid daily - Last TSH was low - Iatrogenic hyperthyroidism due to supplementation could explain weight loss, fatigue, arrhythmia - Repeat TSH    Osteoporosis - She is on prolia - Hip xrays as above.     OSA (obstructive sleep apnea) - CPAP at night    CAD (coronary artery disease) - Continue Imdur, losartan, asa, cozaar, metoprolol.  - Monitor blood pressure  Wheezing - She reports her cardiologist placed her on Qvar and albuterol for this issue, they were continued.      DVT prophylaxis: Lovenox Code Status: DO NOT RESUSCITATE  Family Communication: [name] and daughter at bedside Disposition Plan: Admit to obs for above workup, likely d/c in 1-2 days Consults called: None Admission status: Telemetry, obs   Debe Coder MD [facility] Pager [phone]  If 7PM-7AM, please contact night-coverage [email or web address] Password [email or web address]  07/27/2016, 11:26 PM

## 2016-07-28 ENCOUNTER — Encounter (HOSPITAL_COMMUNITY): Payer: Self-pay | Admitting: *Deleted

## 2016-07-28 ENCOUNTER — Observation Stay (HOSPITAL_COMMUNITY): Payer: Medicare Other

## 2016-07-28 DIAGNOSIS — N136 Pyonephrosis: Secondary | ICD-10-CM | POA: Diagnosis not present

## 2016-07-28 DIAGNOSIS — Z66 Do not resuscitate: Secondary | ICD-10-CM | POA: Diagnosis not present

## 2016-07-28 DIAGNOSIS — E785 Hyperlipidemia, unspecified: Secondary | ICD-10-CM | POA: Diagnosis not present

## 2016-07-28 DIAGNOSIS — R079 Chest pain, unspecified: Secondary | ICD-10-CM | POA: Diagnosis not present

## 2016-07-28 DIAGNOSIS — E038 Other specified hypothyroidism: Secondary | ICD-10-CM | POA: Diagnosis not present

## 2016-07-28 DIAGNOSIS — E039 Hypothyroidism, unspecified: Secondary | ICD-10-CM | POA: Diagnosis not present

## 2016-07-28 DIAGNOSIS — K219 Gastro-esophageal reflux disease without esophagitis: Secondary | ICD-10-CM | POA: Diagnosis not present

## 2016-07-28 DIAGNOSIS — I5022 Chronic systolic (congestive) heart failure: Secondary | ICD-10-CM | POA: Diagnosis not present

## 2016-07-28 DIAGNOSIS — E440 Moderate protein-calorie malnutrition: Secondary | ICD-10-CM | POA: Diagnosis not present

## 2016-07-28 DIAGNOSIS — R0789 Other chest pain: Secondary | ICD-10-CM | POA: Diagnosis not present

## 2016-07-28 DIAGNOSIS — M546 Pain in thoracic spine: Secondary | ICD-10-CM | POA: Diagnosis not present

## 2016-07-28 DIAGNOSIS — G4733 Obstructive sleep apnea (adult) (pediatric): Secondary | ICD-10-CM | POA: Diagnosis not present

## 2016-07-28 DIAGNOSIS — M810 Age-related osteoporosis without current pathological fracture: Secondary | ICD-10-CM | POA: Diagnosis not present

## 2016-07-28 DIAGNOSIS — B952 Enterococcus as the cause of diseases classified elsewhere: Secondary | ICD-10-CM | POA: Diagnosis not present

## 2016-07-28 DIAGNOSIS — R1030 Lower abdominal pain, unspecified: Secondary | ICD-10-CM | POA: Diagnosis not present

## 2016-07-28 DIAGNOSIS — Z6825 Body mass index (BMI) 25.0-25.9, adult: Secondary | ICD-10-CM | POA: Diagnosis not present

## 2016-07-28 DIAGNOSIS — M545 Low back pain: Secondary | ICD-10-CM

## 2016-07-28 LAB — CBC
HCT: 41.4 % (ref 36.0–46.0)
Hemoglobin: 14.1 g/dL (ref 12.0–15.0)
MCH: 30.9 pg (ref 26.0–34.0)
MCHC: 34.1 g/dL (ref 30.0–36.0)
MCV: 90.6 fL (ref 78.0–100.0)
Platelets: 168 10*3/uL (ref 150–400)
RBC: 4.57 MIL/uL (ref 3.87–5.11)
RDW: 12.7 % (ref 11.5–15.5)
WBC: 9.4 10*3/uL (ref 4.0–10.5)

## 2016-07-28 LAB — CREATININE, SERUM
CREATININE: 0.99 mg/dL (ref 0.44–1.00)
GFR calc Af Amer: 55 mL/min — ABNORMAL LOW (ref >60)
GFR, EST NON AFRICAN AMERICAN: 48 mL/min — AB (ref >60)

## 2016-07-28 LAB — TROPONIN I

## 2016-07-28 LAB — MAGNESIUM: Magnesium: 2 mg/dL (ref 1.7–2.4)

## 2016-07-28 LAB — TSH: TSH: 2.014 u[IU]/mL (ref 0.350–4.500)

## 2016-07-28 MED ORDER — LOSARTAN POTASSIUM 50 MG PO TABS
50.0000 mg | ORAL_TABLET | Freq: Every day | ORAL | Status: DC
  Administered 2016-07-28 – 2016-07-30 (×3): 50 mg via ORAL
  Filled 2016-07-28 (×3): qty 1

## 2016-07-28 MED ORDER — PRAVASTATIN SODIUM 20 MG PO TABS
20.0000 mg | ORAL_TABLET | Freq: Every day | ORAL | Status: DC
  Administered 2016-07-28 – 2016-07-29 (×3): 20 mg via ORAL
  Filled 2016-07-28 (×3): qty 1

## 2016-07-28 MED ORDER — METOPROLOL SUCCINATE ER 50 MG PO TB24
50.0000 mg | ORAL_TABLET | Freq: Every day | ORAL | Status: DC
  Administered 2016-07-28 – 2016-07-30 (×3): 50 mg via ORAL
  Filled 2016-07-28 (×3): qty 1

## 2016-07-28 MED ORDER — ACETAMINOPHEN 325 MG PO TABS: 650.0000 mg | ORAL_TABLET | ORAL | Status: DC | PRN

## 2016-07-28 MED ORDER — ASPIRIN EC 81 MG PO TBEC
81.0000 mg | DELAYED_RELEASE_TABLET | Freq: Every day | ORAL | Status: DC
  Administered 2016-07-28 – 2016-07-30 (×3): 81 mg via ORAL
  Filled 2016-07-28 (×3): qty 1

## 2016-07-28 MED ORDER — ONDANSETRON HCL 4 MG/2ML IJ SOLN
4.0000 mg | Freq: Four times a day (QID) | INTRAMUSCULAR | Status: DC | PRN
  Administered 2016-07-28 – 2016-07-29 (×2): 4 mg via INTRAVENOUS
  Filled 2016-07-28 (×3): qty 2

## 2016-07-28 MED ORDER — NITROGLYCERIN 0.4 MG SL SUBL
0.4000 mg | SUBLINGUAL_TABLET | SUBLINGUAL | Status: DC | PRN
Start: 2016-07-28 — End: 2016-07-30

## 2016-07-28 MED ORDER — TRAMADOL HCL 50 MG PO TABS
50.0000 mg | ORAL_TABLET | Freq: Two times a day (BID) | ORAL | Status: DC | PRN
  Administered 2016-07-28: 50 mg via ORAL
  Filled 2016-07-28: qty 1

## 2016-07-28 MED ORDER — ADULT MULTIVITAMIN LIQUID CH
15.0000 mL | Freq: Every day | ORAL | Status: DC
  Administered 2016-07-28 – 2016-07-30 (×3): 15 mL via ORAL
  Filled 2016-07-28 (×3): qty 15

## 2016-07-28 MED ORDER — ENOXAPARIN SODIUM 30 MG/0.3ML ~~LOC~~ SOLN
30.0000 mg | SUBCUTANEOUS | Status: DC
  Administered 2016-07-29: 30 mg via SUBCUTANEOUS
  Filled 2016-07-28: qty 0.3

## 2016-07-28 MED ORDER — ISOSORBIDE MONONITRATE ER 30 MG PO TB24
30.0000 mg | ORAL_TABLET | Freq: Every day | ORAL | Status: DC
  Administered 2016-07-28 – 2016-07-30 (×3): 30 mg via ORAL
  Filled 2016-07-28 (×3): qty 1

## 2016-07-28 MED ORDER — ENOXAPARIN SODIUM 40 MG/0.4ML ~~LOC~~ SOLN
40.0000 mg | SUBCUTANEOUS | Status: DC
  Administered 2016-07-28: 40 mg via SUBCUTANEOUS
  Filled 2016-07-28: qty 0.4

## 2016-07-28 MED ORDER — TRAMADOL HCL 50 MG PO TABS
50.0000 mg | ORAL_TABLET | Freq: Four times a day (QID) | ORAL | Status: DC | PRN
  Administered 2016-07-29: 50 mg via ORAL
  Filled 2016-07-28: qty 1

## 2016-07-28 MED ORDER — LEVOTHYROXINE SODIUM 100 MCG PO TABS
100.0000 ug | ORAL_TABLET | Freq: Every day | ORAL | Status: DC
  Administered 2016-07-28 – 2016-07-30 (×3): 100 ug via ORAL
  Filled 2016-07-28 (×3): qty 1

## 2016-07-28 MED ORDER — ALBUTEROL SULFATE (2.5 MG/3ML) 0.083% IN NEBU: 3.0000 mL | INHALATION_SOLUTION | Freq: Four times a day (QID) | RESPIRATORY_TRACT | Status: DC | PRN

## 2016-07-28 MED ORDER — GI COCKTAIL ~~LOC~~: 30.0000 mL | Freq: Four times a day (QID) | ORAL | Status: DC | PRN

## 2016-07-28 MED ORDER — PSYLLIUM 95 % PO PACK
1.0000 | PACK | Freq: Every day | ORAL | Status: DC
  Administered 2016-07-28 – 2016-07-29 (×2): 1 via ORAL
  Filled 2016-07-28 (×2): qty 1

## 2016-07-28 MED ORDER — ENSURE ENLIVE PO LIQD
237.0000 mL | Freq: Two times a day (BID) | ORAL | Status: DC
  Administered 2016-07-28: 237 mL via ORAL

## 2016-07-28 MED ORDER — BUDESONIDE 0.25 MG/2ML IN SUSP
2.0000 mL | Freq: Two times a day (BID) | RESPIRATORY_TRACT | Status: DC
  Administered 2016-07-28 – 2016-07-29 (×4): 0.25 mg via RESPIRATORY_TRACT
  Filled 2016-07-28 (×5): qty 2

## 2016-07-28 NOTE — Progress Notes (Signed)
Initial Nutrition Assessment  DOCUMENTATION CODES:   Non-severe (moderate) malnutrition in context of chronic illness  INTERVENTION:  - Will order Ensure Enlive po BID, each supplement provides 350 kcal and 20 grams of protein - Will order liquid daily multivitamin.  - Continue to encourage PO intakes of meals and supplements.   NUTRITION DIAGNOSIS:   Malnutrition (moderate/non-severe) related to chronic illness, poor appetite (worsening hiatal hernia, per pt) as evidenced by moderate depletions of muscle mass, moderate depletion of body fat.  GOAL:   Patient will meet greater than or equal to 90% of their needs  MONITOR:   PO intake, Supplement acceptance, Weight trends, Labs  REASON FOR ASSESSMENT:   Malnutrition Screening Tool  ASSESSMENT:   83 y.o. female with medical history significant of ICM with systolic CHF (last TTE in our system was in 2008, EF of 40%), pacemaker in place, OSA on CPAP, hypothyroidism, HTN, HLD, GERD, CAD who presents for weakness and chest tightness.  She also told the EDP that she was having worsening SOB, but denied this to me.  SHe reports that she has been getting progressively weaker.  She has been having intermittent chest tightness that is sometimes pain, substernal to epigastric without radiation.  She cannot further characterize the pain.  It is exertional, but she often ignores it.  She has been having worsening swelling off an on, and has increased her lasix use from PRN to twice weekly.  Pt seen for MST. BMI indicates overweight status, appropriate for advanced age. No intakes documented since admission. Breakfast in front of pt at time of RD visit: a few spoons of grits and 1/2 of blueberry muffin consumed. Daughter, who is at bedside, states that pt has not eaten much the past few days and typically eats very slowly. Pt reports that she was dx with a hiatal hernia some time ago and informed that it is worsening. She states need to eat and drink  slowly so that she does not choke or have regurgitation. D/t this need for being so slow pt loses her desire to eat. She drinks Boost BID at home and is interested in receiving Ensure during hospitalization. Pt reports dental needs (which doe not cause pain but cause difficulty chewing) and lead to further decrease in intakes some days.   Physical assessment shows mild/moderate muscle wasting and mild/moderate fat wasting. Per chart review, weight has been stable (126-130 lbs) since September 2017. Pt reports 23 lb weight loss in the past few years. Based on this report, she has lost 15% body weight in unknown number of years; likely not significant for time frame.   Medications reviewed; 20 mg IV Lasix x1 dose yesterday, 100 mcg oral Synthroid/day, 1 packet Metamucil/day.  Labs reviewed; Na: 134 mmol/L, Cl: 100 mmol/L, GFR: 48 mL/min.    Diet Order:  Diet Heart Room service appropriate? Yes; Fluid consistency: Thin  Skin:  Reviewed, no issues  Last BM:  7/20  Height:   Ht Readings from Last 1 Encounters:  07/28/16 4\' 10"  (1.473 m)    Weight:   Wt Readings from Last 1 Encounters:  07/28/16 127 lb 6.4 oz (57.8 kg)    Ideal Body Weight:  42.18 kg  BMI:  Body mass index is 26.63 kg/m.  Estimated Nutritional Needs:   Kcal:  1160-1330 (20-23 kcal/kg)  Protein:  46-58 grams (0.8-1 grams/kg)  Fluid:  >/= 1.3 L/day  EDUCATION NEEDS:   No education needs identified at this time    Trenton GammonJessica Laverle Pillard,  MS, RD, LDN, CNSC Inpatient Clinical Dietitian Pager # [phone] After hours/weekend pager # [phone]  

## 2016-07-28 NOTE — Progress Notes (Signed)
Patient placed on CPAP for the evening without complication.

## 2016-07-28 NOTE — Progress Notes (Signed)
PROGRESS NOTE    [name]  ZOX:[id number] DOB: 01-06-1922 DOA: 07/27/2016 PCP: Donato Schultz, DO   Brief Narrative: 83 y.o. female with medical history significant of ICM with systolic CHF (last TTE in our system was in 2008, EF of 40%), pacemaker in place, OSA on CPAP, hypothyroidism, HTN, HLD, GERD, CAD who presents for weakness and chest tightness. Also complaining of right lower back. In the ER patient received a dose of IV Lasix for mildly elevated BNP.  Assessment & Plan:  # Chest pain/shortness of breath: Improved this morning. -Troponin negative, EKG unchanged. Chest x-ray with no acute finding. Patient denied any chest pain or shortness of breath nausea vomiting today. -Continue current cardiac medication, nitroglycerin when necessary. -Echocardiogram pending -Continue aspirin  #Right lower back/hip pain: -Patient reported her main reason for hospital visit was a right lower back pain. Denied fall or injury. UA negative. Urine culture pending. Denies any urinary symptoms fever. Right hip x-ray unremarkable. Patient was locating her pain mostly in the right lower back. Not tender on exam. -Ordered x-ray of thoracic spine, lumbar spine and ribs to evaluate for any fracture or bony abnormalities -Ordered Ultram as needed for pain management. Seems like it is helping her -PT OT evaluation ordered.   #Chronic systolic congestive heart failure: Patient looks euvolemic on exam. Continue current cardiac medications including Imdur, losartan, metoprolol, aspirin and statin.  #Hypothyroidism: Continue Synthroid. TSH level acceptable.  #Hyperlipidemia: Continue statin  #Biventricular ICD: Recommended to follow-up with her cardiologist outpatient.  #History of osteoporosis: On prolia  # moderate protein calorie malnutrition: Dietary evaluation, continue Ensure  PT OT evaluation and follow-up pending test results. I discussed with the patient and her granddaughter at  bedside in length.  DVT prophylaxis: Lovenox subcutaneous Code Status: DO NOT RESUSCITATE Family Communication: Discussed with the patient's granddaughter at bedside Disposition Plan: Likely discharge home in 1-2 days    Consultants:   None  Procedures: None Antimicrobials: None  Subjective: Seen and examined at bedside. Denied chest pain, shortness of breath, nausea or vomiting. Reports right lower flank pain worsened for last few days, unable to describe the nature pain. Denied fall injury or trauma.  Objective: Vitals:   07/28/16 0110 07/28/16 0757 07/28/16 1033 07/28/16 1148  BP: 131/61  (!) 104/59 118/66  Pulse: 75   75  Resp: 18   20  Temp: 98.4 F (36.9 C)   98.4 F (36.9 C)  TempSrc: Oral   Oral  SpO2: 95% 93%  92%  Weight: 57.8 kg (127 lb 6.4 oz)     Height: 4\' 10"  (1.473 m)       Intake/Output Summary (Last 24 hours) at 07/28/16 1239 Last data filed at 07/28/16 8119  Gross per 24 hour  Intake              120 ml  Output             1050 ml  Net             -930 ml   Filed Weights   07/27/16 2039 07/28/16 0110  Weight: 54.9 kg (121 lb) 57.8 kg (127 lb 6.4 oz)    Examination:  General exam: Pleasant elderly female lying on bed comfortable  Respiratory system: Clear to auscultation. Respiratory effort normal. No wheezing or crackle Cardiovascular system: S1 & S2 heard, RRR.  No pedal edema. Gastrointestinal system: Abdomen is nondistended, soft and nontender. Normal bowel sounds heard. Central nervous system: Alert and oriented.  No focal neurological deficits. Extremities: Symmetric 5 x 5 power. Skin: No rashes, lesions or ulcers No swelling redness or tenderness on the back.    Data Reviewed: I have personally reviewed following labs and imaging studies  CBC:  Recent Labs Lab 07/27/16 2041 07/28/16 0058  WBC 9.3 9.4  NEUTROABS 7.9*  --   HGB 13.7 14.1  HCT 41.6 41.4  MCV 90.8 90.6  PLT 177 168   Basic Metabolic Panel:  Recent  Labs Lab 07/27/16 2041 07/28/16 0058  NA 134*  --   K 4.7  --   CL 100*  --   CO2 26  --   GLUCOSE 133*  --   BUN 14  --   CREATININE 0.82 0.99  CALCIUM 10.2  --   MG  --  2.0   GFR: Estimated Creatinine Clearance: 26.7 mL/min (by C-G formula based on SCr of 0.99 mg/dL). Liver Function Tests:  Recent Labs Lab 07/27/16 2041  AST 29  ALT 17  ALKPHOS 72  BILITOT 0.9  PROT 7.2  ALBUMIN 4.5   No results for input(s): LIPASE, AMYLASE in the last 168 hours. No results for input(s): AMMONIA in the last 168 hours. Coagulation Profile: No results for input(s): INR, PROTIME in the last 168 hours. Cardiac Enzymes:  Recent Labs Lab 07/28/16 0058 07/28/16 0343 07/28/16 0622  TROPONINI <0.03 <0.03 <0.03   BNP (last 3 results) No results for input(s): PROBNP in the last 8760 hours. HbA1C: No results for input(s): HGBA1C in the last 72 hours. CBG: No results for input(s): GLUCAP in the last 168 hours. Lipid Profile: No results for input(s): CHOL, HDL, LDLCALC, TRIG, CHOLHDL, LDLDIRECT in the last 72 hours. Thyroid Function Tests:  Recent Labs  07/28/16 0058  TSH 2.014   Anemia Panel: No results for input(s): VITAMINB12, FOLATE, FERRITIN, TIBC, IRON, RETICCTPCT in the last 72 hours. Sepsis Labs: No results for input(s): PROCALCITON, LATICACIDVEN in the last 168 hours.  No results found for this or any previous visit (from the past 240 hour(s)).       Radiology Studies: Dg Chest 2 View  Result Date: 07/27/2016 CLINICAL DATA:  Initial evaluation for acute chest pain. EXAM: CHEST  2 VIEW COMPARISON:  Prior radiograph from 04/10/2016. FINDINGS: Left-sided pacemaker/ AICD in place, stable. Stable cardiomegaly. Mediastinal silhouette normal. Aortic atherosclerosis. Moderate to large hiatal hernia. Elevation of the right hemidiaphragm, similar to prior, likely chronic. Associated right basilar atelectasis. Scattered linear density at the left lung base also consistent  with atelectasis. No focal infiltrates. No pulmonary edema or pleural effusion. No pneumothorax. No acute osseus abnormality. IMPRESSION: 1. Chronic elevation of the right hemidiaphragm and moderate hiatal hernia with associated bibasilar atelectasis, similar to previous. 2. No other active cardiopulmonary disease. 3. Stable cardiomegaly without pulmonary edema. 4. Aortic atherosclerosis. Electronically Signed   By: Rise MuBenjamin  McClintock M.D.   On: 07/27/2016 22:05   Dg Hip Unilat With Pelvis 2-3 Views Right  Result Date: 07/28/2016 CLINICAL DATA:  Right lower back pain radiating to the right groin for 1 week. EXAM: DG HIP (WITH OR WITHOUT PELVIS) 2-3V RIGHT COMPARISON:  None. FINDINGS: No evidence of hip fracture or malalignment. No degenerative hip narrowing or spurring. Pelvic ring appears to be intact, limited over the sacrum and iliac wings due to bowel gas. Osteopenia and atherosclerosis. Scoliosis. IMPRESSION: No acute finding or degenerative hip narrowing. Electronically Signed   By: Marnee SpringJonathon  Watts M.D.   On: 07/28/2016 09:13  Scheduled Meds: . aspirin EC  81 mg Oral Daily  . budesonide  2 mL Inhalation BID  . enoxaparin (LOVENOX) injection  40 mg Subcutaneous Q24H  . feeding supplement (ENSURE ENLIVE)  237 mL Oral BID BM  . isosorbide mononitrate  30 mg Oral Daily  . levothyroxine  100 mcg Oral QAC breakfast  . losartan  50 mg Oral Daily  . metoprolol succinate  50 mg Oral Daily  . multivitamin  15 mL Oral Daily  . pravastatin  20 mg Oral QHS  . psyllium  1 packet Oral QHS   Continuous Infusions:   LOS: 0 days    Tymothy Cass Jaynie Collins, MD [facility] Pager [phone]  If 7PM-7AM, please contact night-coverage [email or web address] Password [facility] 07/28/2016, 12:39 PM

## 2016-07-28 NOTE — ED Notes (Signed)
Report called to rn on 3e 

## 2016-07-29 ENCOUNTER — Observation Stay (HOSPITAL_BASED_OUTPATIENT_CLINIC_OR_DEPARTMENT_OTHER): Payer: Medicare Other

## 2016-07-29 ENCOUNTER — Observation Stay (HOSPITAL_COMMUNITY): Payer: Medicare Other

## 2016-07-29 DIAGNOSIS — Z6825 Body mass index (BMI) 25.0-25.9, adult: Secondary | ICD-10-CM | POA: Diagnosis not present

## 2016-07-29 DIAGNOSIS — N1330 Unspecified hydronephrosis: Secondary | ICD-10-CM | POA: Diagnosis not present

## 2016-07-29 DIAGNOSIS — B952 Enterococcus as the cause of diseases classified elsewhere: Secondary | ICD-10-CM

## 2016-07-29 DIAGNOSIS — N390 Urinary tract infection, site not specified: Secondary | ICD-10-CM | POA: Diagnosis not present

## 2016-07-29 DIAGNOSIS — Z66 Do not resuscitate: Secondary | ICD-10-CM | POA: Diagnosis present

## 2016-07-29 DIAGNOSIS — Z95810 Presence of automatic (implantable) cardiac defibrillator: Secondary | ICD-10-CM | POA: Diagnosis not present

## 2016-07-29 DIAGNOSIS — R079 Chest pain, unspecified: Secondary | ICD-10-CM | POA: Diagnosis present

## 2016-07-29 DIAGNOSIS — I340 Nonrheumatic mitral (valve) insufficiency: Secondary | ICD-10-CM

## 2016-07-29 DIAGNOSIS — R072 Precordial pain: Secondary | ICD-10-CM | POA: Diagnosis not present

## 2016-07-29 DIAGNOSIS — M545 Low back pain: Secondary | ICD-10-CM | POA: Diagnosis not present

## 2016-07-29 DIAGNOSIS — K219 Gastro-esophageal reflux disease without esophagitis: Secondary | ICD-10-CM | POA: Diagnosis present

## 2016-07-29 DIAGNOSIS — I255 Ischemic cardiomyopathy: Secondary | ICD-10-CM | POA: Diagnosis present

## 2016-07-29 DIAGNOSIS — G4733 Obstructive sleep apnea (adult) (pediatric): Secondary | ICD-10-CM | POA: Diagnosis present

## 2016-07-29 DIAGNOSIS — I252 Old myocardial infarction: Secondary | ICD-10-CM | POA: Diagnosis not present

## 2016-07-29 DIAGNOSIS — R109 Unspecified abdominal pain: Secondary | ICD-10-CM | POA: Diagnosis not present

## 2016-07-29 DIAGNOSIS — I5022 Chronic systolic (congestive) heart failure: Secondary | ICD-10-CM | POA: Diagnosis present

## 2016-07-29 DIAGNOSIS — E440 Moderate protein-calorie malnutrition: Secondary | ICD-10-CM | POA: Diagnosis present

## 2016-07-29 DIAGNOSIS — E785 Hyperlipidemia, unspecified: Secondary | ICD-10-CM | POA: Diagnosis present

## 2016-07-29 DIAGNOSIS — Z833 Family history of diabetes mellitus: Secondary | ICD-10-CM | POA: Diagnosis not present

## 2016-07-29 DIAGNOSIS — Z96653 Presence of artificial knee joint, bilateral: Secondary | ICD-10-CM | POA: Diagnosis present

## 2016-07-29 DIAGNOSIS — M25551 Pain in right hip: Secondary | ICD-10-CM

## 2016-07-29 DIAGNOSIS — R112 Nausea with vomiting, unspecified: Secondary | ICD-10-CM | POA: Diagnosis not present

## 2016-07-29 DIAGNOSIS — R0789 Other chest pain: Secondary | ICD-10-CM | POA: Diagnosis not present

## 2016-07-29 DIAGNOSIS — E039 Hypothyroidism, unspecified: Secondary | ICD-10-CM | POA: Diagnosis present

## 2016-07-29 DIAGNOSIS — Z8249 Family history of ischemic heart disease and other diseases of the circulatory system: Secondary | ICD-10-CM | POA: Diagnosis not present

## 2016-07-29 DIAGNOSIS — I2510 Atherosclerotic heart disease of native coronary artery without angina pectoris: Secondary | ICD-10-CM | POA: Diagnosis present

## 2016-07-29 DIAGNOSIS — N136 Pyonephrosis: Secondary | ICD-10-CM | POA: Diagnosis present

## 2016-07-29 DIAGNOSIS — M810 Age-related osteoporosis without current pathological fracture: Secondary | ICD-10-CM | POA: Diagnosis present

## 2016-07-29 DIAGNOSIS — I110 Hypertensive heart disease with heart failure: Secondary | ICD-10-CM | POA: Diagnosis present

## 2016-07-29 LAB — ECHOCARDIOGRAM COMPLETE
HEIGHTINCHES: 58 in
WEIGHTICAEL: 1982.4 [oz_av]

## 2016-07-29 MED ORDER — AMOXICILLIN 250 MG PO CAPS
250.0000 mg | ORAL_CAPSULE | Freq: Two times a day (BID) | ORAL | Status: DC
  Administered 2016-07-29 (×2): 250 mg via ORAL
  Filled 2016-07-29 (×3): qty 1

## 2016-07-29 MED ORDER — METHOCARBAMOL 500 MG PO TABS
500.0000 mg | ORAL_TABLET | Freq: Three times a day (TID) | ORAL | Status: DC
Start: 2016-07-29 — End: 2016-07-30
  Administered 2016-07-29 – 2016-07-30 (×3): 500 mg via ORAL
  Filled 2016-07-29 (×3): qty 1

## 2016-07-29 NOTE — Consult Note (Signed)
I have been asked to see the patient by Dr. Debe Coder, for evaluation and management of right hydronephrosis.  History of present illness: 51 female who is seen today for further evaluation and management of right-sided hydronephrosis. The patient was admitted on Friday evening, 2 nights prior, for shortness of breath, chest pain, and some flank pain. Cardiac evaluation was normal. She continued to have flank pain. She denies any voiding symptoms. She denies any dysuria, gross hematuria, worsening frequency or urgency. A CT scan was performed, noncontrast, demonstrating right sided hydronephrosis with no significant dilation of the ureter. There is no evidence of an obstructing kidney stone. Of note, the patient had a CT scan April, 3 months prior, and there was no hydronephrosis. She does have small bilateral nonobstructing stones, difficult to say whether she has fewer stones in the right kidney today or not, which would be the result of recently passing a stone.  The patient's urinalysis at the time of admission was normal with no evidence of infection or microscopic hematuria. However, urine culture was sent nonetheless, and has returned as enterococcus. She has no white blood cell count elevation or any fevers to suggest that she is truly infected.  The patient does have a history of recurrent urinary tract infections and has been seen in the urology clinic for this in the past.  Review of systems: A 12 point comprehensive review of systems was obtained and is negative unless otherwise stated in the history of present illness.  Patient Active Problem List   Diagnosis Date Noted  . Enterococcus UTI   . Malnutrition of moderate degree 07/28/2016  . Chest pressure 07/27/2016  . Rectal bleeding 04/09/2016  . CAD (coronary artery disease)   . OSA (obstructive sleep apnea) 10/01/2014  . Nocturnal hypoxia 08/27/2014  . Olecranon bursitis of right elbow 08/13/2014  . Dyspnea 04/22/2014  .  Physical exam 02/18/2014  . Headache(784.0) 09/11/2013  . Foot abrasion 09/02/2013  . Skin infection 08/19/2013  . Injury to upper leg 08/05/2013  . Need for prophylactic vaccination with tetanus-diphtheria (Td) 08/05/2013  . Hematoma of leg 08/05/2013  . Acute bronchitis 03/19/2013  . Post concussive syndrome 03/08/2013  . Cough 03/06/2013  . Tick bite of left side of sternum 06/04/2012  . Hip pain 03/06/2012  . Hammer toe 03/06/2012  . Anxiety 01/25/2012  . Hives 01/25/2012  . Back pain 01/25/2012  . Cervical dystonia 12/19/2011  . Sinusitis 12/17/2011  . Tremor 12/17/2011  . Osteoporosis 08/07/2011  . Nasal sore 07/02/2011  . Edema 05/24/2011  . Fatigue 05/24/2011  . Seasonal allergic rhinitis 05/24/2011  . Foot joint pain 04/26/2011  . Memory changes 04/16/2011  . Headache causing frequent awakening from sleep 04/16/2011  . Constipation, slow transit 04/16/2011  . Hypothyroid 02/06/2011  . Biventricular implantable cardioverter-defibrillator in situ 07/25/2010  . St. Jude Riata lead-series of 1500 07/25/2010  . Chest pain 07/25/2010  . Lightheadedness 07/25/2010  . Leg pain 07/25/2010  . PAROXYSMAL VENTRICULAR TACHYCARDIA 11/14/2009  . HYPOTENSION, ORTHOSTATIC, HX OF 04/11/2009  . Chronic cystitis 02/25/2009  . UTI'S, RECURRENT 02/25/2009  . FEMALE STRESS INCONTINENCE 02/25/2009  . POSTMENOPAUSAL ATROPHIC VAGINITIS 02/25/2009  . HEARTBURN 02/25/2009  . PERSONAL HISTORY OF ARTHRITIS 02/25/2009  . Other acquired absence of organ 02/25/2009  . TOTAL KNEE REPLACEMENT, HX OF 02/25/2009  . ESOPHAGEAL STRICTURE 09/03/2008  . WEIGHT LOSS-ABNORMAL 09/03/2008  . NAUSEA 09/03/2008  . ABDOMINAL PAIN -GENERALIZED 09/03/2008  . Hyperlipidemia 10/22/2007  . CARDIOMYOPATHY, ISCHEMIC 10/22/2007  .  Chronic systolic heart failure (HCC) 10/22/2007    No current facility-administered medications on file prior to encounter.    Current Outpatient Prescriptions on File Prior to  Encounter  Medication Sig Dispense Refill  . albuterol (PROVENTIL HFA;VENTOLIN HFA) 108 (90 Base) MCG/ACT inhaler Inhale 2 puffs into the lungs every 6 (six) hours as needed for wheezing or shortness of breath. 1 Inhaler 2  . aspirin (ASPIR-81) 81 MG EC tablet Take 81 mg by mouth daily.      . beclomethasone (QVAR) 40 MCG/ACT inhaler Inhale 2 puffs into the lungs 2 (two) times daily. (Patient taking differently: Inhale 2 puffs into the lungs 2 (two) times daily as needed (shortness of breath). ) 1 Inhaler 3  . diphenhydrAMINE (BENADRYL) 25 mg capsule Take 25 mg by mouth every 6 (six) hours as needed for itching or allergies.    . furosemide (LASIX) 20 MG tablet Take 1 tablet (20 mg total) by mouth daily as needed for fluid or edema. (Patient taking differently: Take 20 mg by mouth 2 (two) times a week. ) 90 tablet 3  . isosorbide mononitrate (IMDUR) 30 MG 24 hr tablet TAKE ONE & ONE-HALF TABLETS BY MOUTH ONCE DAILY IN THE MORNING 45 tablet 5  . levothyroxine (SYNTHROID, LEVOTHROID) 100 MCG tablet TAKE 1 TABLET BY MOUTH ONCE DAILY BEFORE BREAKFAST 30 tablet 0  . Liniments (SALONPAS PAIN RELIEF PATCH EX) Apply 1 patch topically daily as needed (pain).     [name] losartan (COZAAR) 50 MG tablet TAKE 1 TABLET BY MOUTH ONCE DAILY (YOU NEED APPOINTMENT FOR MORE REFILLS) 90 tablet 0  . metoprolol succinate (TOPROL-XL) 50 MG 24 hr tablet TAKE ONE TABLET BY MOUTH TWICE DAILY WITH  OR  IMMEDIATELY  FOLLOWING  A  MEAL (Patient taking differently: TAKE1/2 TABLET BY MOUTH TWICE DAILY WITH  OR  IMMEDIATELY  FOLLOWING  A  MEAL) 60 tablet 5  . nitroGLYCERIN (NITROSTAT) 0.4 MG SL tablet Place 1 tablet (0.4 mg total) under the tongue every 5 (five) minutes as needed. At the onset of chest pain, Up to 3 doses 25 tablet 1  . NON FORMULARY C-Pap machine, uses nightly for sleep apnea    . pravastatin (PRAVACHOL) 20 MG tablet TAKE ONE TABLET BY MOUTH ONCE DAILY 90 tablet 0  . Psyllium (METAFIBER) 48.57 % POWD Take 10 mLs by mouth  every evening. Mixed with water      Past Medical History:  Diagnosis Date  . Anxiety    sleep  . Arthritis   . Automatic implantable cardioverter/defibrillator (AICD) activation    s/p implantable cardioverter-defibrillator bivenrticular pacer. 12/05. St, Jude Medtronic. remote-no.  [name] CAD (coronary artery disease)    a. s/p prior anterior wall myocardial infarction and multiple percutaneous coronary intervention.;  b. Aden MV 1/14:  low risk, inf and apical defect likely due to atten artifact, no ischemia, EF 35% (visually normal and est 55-60%)  . Chest pain 12/09   recent hospitalization for it. felt to be noncardiac.   [name] CHF (congestive heart failure) (HCC)   . DD (diverticular disease)    Hx of it - severe. Left colon 2004  . Esophageal stricture    last dialated 2007  . GERD (gastroesophageal reflux disease)   . Hiatal hernia   . HTN (hypertension)   . Hyperlipidemia   . Hypothyroidism   . Ischemic cardiomyopathy    EF 25-30% improved to 45% with biventricular pacing.   [name] PVT (paroxysmal ventricular tachycardia) (HCC)   . Sleep  apnea    has a Cpap machine  . Systolic heart failure    improved to class I to II   . UTI (lower urinary tract infection)    recurrent. (Klebsiella pneumoniae). Last Cx 02/03/09. (R-ancef, nitrofurantoin, Augmentin, Zyosyn)    Past Surgical History:  Procedure Laterality Date  . ANGIOPLASTY     stent  . APPENDECTOMY    . CARDIAC CATHETERIZATION     stent  . CARDIAC DEFIBRILLATOR PLACEMENT     St Jude Atlas  . CATARACT EXTRACTION, BILATERAL    . COLONOSCOPY W/ POLYPECTOMY    . EP IMPLANTABLE DEVICE N/A 12/05/2015   Procedure: ICD Generator Changeout;  Surgeon: Duke Salvia, MD;  Location: [facility] [place] INVASIVE CV LAB;  Service: Cardiovascular;  Laterality: N/A;  . HAMMER TOE SURGERY Left 01/15/2013   Procedure: LEFT SECOND THROUGH FOURTH HAMMERTOE CORRECTION ;  Surgeon: Toni Arthurs, MD;  Location: [facility] OR;  Service: Orthopedics;  Laterality: Left;  .  HAMMERTOE RECONSTRUCTION WITH WEIL OSTEOTOMY Right 07/17/2012   Procedure: RIGHT SECOND AND THIRD MT WEIL OSTEOTOMIES AND SECOND AND THIRD HAMMERTOE CORRECTIONS;  Surgeon: Toni Arthurs, MD;  Location: [facility] OR;  Service: Orthopedics;  Laterality: Right;  . INSERT / REPLACE / REMOVE PACEMAKER    . TONSILLECTOMY    . TOTAL KNEE ARTHROPLASTY     bilateral  . TUBAL LIGATION    . unspecified area hysterectomy    . WEIL OSTEOTOMY Left 01/15/2013   Procedure:  WEIL OSTEOTOMY AND DORSAL CAPSULOTOMY;  Surgeon: Toni Arthurs, MD;  Location: [facility] OR;  Service: Orthopedics;  Laterality: Left;    Social History  Substance Use Topics  . Smoking status: Never Smoker  . Smokeless tobacco: Never Used  . Alcohol use No    Family History  Problem Relation Age of Onset  . Heart disease Mother   . Heart disease Father   . Colon polyps Sister   . Heart disease Brother        multiple brothers  . Heart disease Sister        multiple sisters  . Prostate cancer Brother   . Diabetes Daughter   . Diabetes Unknown        aunt  . Breast cancer Daughter   . Alcohol abuse Son   . Brain cancer Son   . Lung cancer Son   . Colon cancer Neg Hx     PE: Vitals:   07/28/16 1949 07/29/16 0401 07/29/16 0846 07/29/16 1144  BP:  (!) 142/82  127/80  Pulse:  71  95  Resp:  18  20  Temp:  99 F (37.2 C)  98.1 F (36.7 C)  TempSrc:  Oral  Oral  SpO2: 95% 94% 95% 94%  Weight:  56.2 kg (123 lb 14.4 oz)    Height:       Patient appears to be in no acute distress  patient is alert and oriented x3 Atraumatic normocephalic head No cervical or supraclavicular lymphadenopathy appreciated No increased work of breathing, no audible wheezes/rhonchi Regular sinus rhythm/rate Mild right CVA/flank tenderness. Her pain is worse with palpation over the posterior iliac spine. Lower extremities are symmetric without appreciable edema Grossly neurologically intact No identifiable skin lesions   Recent Labs  07/27/16 2041  07/28/16 0058  WBC 9.3 9.4  HGB 13.7 14.1  HCT 41.6 41.4    Recent Labs  07/27/16 2041 07/28/16 0058  NA 134*  --   K 4.7  --   CL 100*  --  CO2 26  --   GLUCOSE 133*  --   BUN 14  --   CREATININE 0.82 0.99  CALCIUM 10.2  --    No results for input(s): LABPT, INR in the last 72 hours. No results for input(s): LABURIN in the last 72 hours. Results for orders placed or performed during the hospital encounter of 07/27/16  Culture, Urine     Status: Abnormal (Preliminary result)   Collection Time: 07/27/16  9:25 PM  Result Value Ref Range Status   Specimen Description URINE, RANDOM  Final   Special Requests ADDED 0211 07/28/16  Final   Culture >=100,000 COLONIES/mL ENTEROCOCCUS FAECALIS (A)  Final   Report Status PENDING  Incomplete    Imaging: I then apparently read the patient's CT scan. She has moderate hydronephrosis of the right kidney with no clear similar structure. There does appear to be some perinephric stranding consistent with either forniceal rupture or some straining/inflammation of the kidney.  Imp/Recommendations: The patient has right-sided hydronephrosis. Her symptoms are actually mild, and significantly improved from the time of admission. She continues to have some mild nausea and some irritating right-sided pain. There is no evidence of obstruction along the urinary tract on the right side, and it is difficult to ascertain whether there are fewer stones on today's scan in the 1 3 months prior given the contrast enhancement on the scan 3 months ago. However, I strongly do suspect that she passed a stone.  The patient appears to be colonized with enterococcus given that she is asymptomatic with no voiding symptoms/fever.  However, in light of her right hydronephrosis, this does need to be treated, I agree with starting her on amoxicillin. Sensitivities are pending.  We'll continue to follow, but at this point are recommending conservative therapy. If she does  improve without intervention, we'll plan to follow-up with her in 6 weeks with a renal ultrasound prior.   Berniece SalinesHERRICK, Emmogene Simson W

## 2016-07-29 NOTE — Evaluation (Signed)
Physical Therapy Evaluation Patient Details Name: [name] MRN: [id number] DOB: 20-Apr-1921 Today's Date: 07/29/2016   History of Present Illness  Patient presents to hospital with SOB, nausea and  low back pain. she has  long history of lower back pain. She has severe dgeneration and scoliosis. She contineus to report mild nausea. Her lowr back pain has improved. PMH: UTI, CHF, PVT, carsiomyopathy, GERD, DDD , Chest pain, arthritis, and anxiety  Clinical Impression  Patient seen for initial evaluation. Patient has an antalgic gait and low back pain but both appear to be baseline. Patients scoliosis appears to be severe. She was not given any typical scoliosis stretching 2nd to her advanced ag and advanced scolosis. Her pain is under control and appears to be at baseline. Patient advised the best exercise for her at this time is to keep on walking. She has no need for further skilled acute physical therapy.     Follow Up Recommendations No PT follow up    Equipment Recommendations  None recommended by PT    Recommendations for Other Services       Precautions / Restrictions Precautions Precautions: ICD/Pacemaker Restrictions Weight Bearing Restrictions: No      Mobility  Bed Mobility               General bed mobility comments: Patient fopund in chair. Per nursing the patient was able to get out of bed without difficulty.   Transfers Overall transfer level: Modified independent Equipment used: Rolling walker (2 wheeled)             General transfer comment: No asdsitance required for sit to stand transfer  Ambulation/Gait Ambulation/Gait assistance: Modified independent (Device/Increase time) Ambulation Distance (Feet): 150 Feet Assistive device: Rolling walker (2 wheeled)       General Gait Details: cuing to stay in the walker. Severe forward flexion with gait but appears to bbe baseline 2nd to scoliosis   Stairs            Wheelchair  Mobility    Modified Rankin (Stroke Patients Only)       Balance Overall balance assessment: Modified Independent                                           Pertinent Vitals/Pain Pain Assessment: Faces Faces Pain Scale: Hurts a little bit Pain Descriptors / Indicators: Aching Pain Intervention(s): Limited activity within patient's tolerance;Monitored during session;Repositioned    Home Living Family/patient expects to be discharged to:: Private residence Living Arrangements: Alone Available Help at Discharge: Family                  Prior Function Level of Independence: Independent with assistive device(s)         Comments: Patient used rollator walker prior to hospitialization. She still drives and is indepndent with all house work depsite lower back pain      Hand Dominance   Dominant Hand: Right    Extremity/Trunk Assessment   Upper Extremity Assessment Upper Extremity Assessment: Overall WFL for tasks assessed    Lower Extremity Assessment Lower Extremity Assessment: Overall WFL for tasks assessed       Communication   Communication: No difficulties  Cognition Arousal/Alertness: Awake/alert Behavior During Therapy: WFL for tasks assessed/performed Overall Cognitive Status: Within Functional Limits for tasks assessed  General Comments: very pleasent       General Comments      Exercises     Assessment/Plan    PT Assessment Patent does not need any further PT services  PT Problem List         PT Treatment Interventions      PT Goals (Current goals can be found in the Care Plan section)  Acute Rehab PT Goals Patient Stated Goal: to go home PT Goal Formulation: With patient Potential to Achieve Goals: Good    Frequency     Barriers to discharge        Co-evaluation               AM-PAC PT "6 Clicks" Daily Activity  Outcome Measure Difficulty turning over  in bed (including adjusting bedclothes, sheets and blankets)?: None Difficulty moving from lying on back to sitting on the side of the bed? : None Difficulty sitting down on and standing up from a chair with arms (e.g., wheelchair, bedside commode, etc,.)?: None Help needed moving to and from a bed to chair (including a wheelchair)?: None Help needed walking in hospital room?: None Help needed climbing 3-5 steps with a railing? : A Little 6 Click Score: 23    End of Session Equipment Utilized During Treatment: Gait belt Activity Tolerance: Patient tolerated treatment well Patient left: in chair;with call bell/phone within reach Nurse Communication: Mobility status PT Visit Diagnosis: Pain Pain - Right/Left: Right Pain - part of body: Hip    Time: [id number] PT Time Calculation (min) (ACUTE ONLY): 16 min   Charges:   PT Evaluation $PT Eval Low Complexity: 1 Procedure     PT G Codes:   PT G-Codes **NOT FOR INPATIENT CLASS** Functional Assessment Tool Used: AM-PAC 6 Clicks Basic Mobility;Clinical judgement Functional Limitation: Mobility: Walking and moving around Mobility: Walking and Moving Around Current Status ([id number](G8978): At least 1 percent but less than 20 percent impaired, limited or restricted Mobility: Walking and Moving Around Goal Status [phone](G8979): At least 1 percent but less than 20 percent impaired, limited or restricted Mobility: Walking and Moving Around Discharge Status [phone](G8980): At least 1 percent but less than 20 percent impaired, limited or restricted     Dessie Comaavid J Cameka Rae PT DPT  07/29/2016, 9:35 AM

## 2016-07-29 NOTE — Progress Notes (Signed)
PROGRESS NOTE    [name]  WUJ:[id number] DOB: 1921-06-26 DOA: 07/27/2016 PCP: Donato Schultz, DO   Brief Narrative: 83 y.o. female with medical history significant of ICM with systolic CHF (last TTE in our system was in 2008, EF of 40%), pacemaker in place, OSA on CPAP, hypothyroidism, HTN, HLD, GERD, CAD who presents for weakness and chest tightness. Also complaining of right lower back. In the ER patient received a dose of IV Lasix for mildly elevated BNP.  Assessment & Plan:  # Chest pain/shortness of breath: Improved this morning. -Troponin negative, EKG unchanged. Chest x-ray with no acute finding. Patient denied any chest pain or shortness of breath nausea vomiting today. -Continue current cardiac medication, nitroglycerin when necessary. -Echocardiogram with EF 45-50 % -Continue aspirin, statin, imdur  #Right lower back/hip pain/flank pain: -X-ray of the back and hips consistent with the scoliosis and the degenerative disc disease. UA negative. Urine culture positive with unknown organism. Patient denies any symptoms. Her pain is not improved. Patient and family concerned about renal involvement. I'm planning to get CT scan of abdomen and pelvis without contrast. Patient also with nausea but no vomiting or abdominal pain. -Continue Ultram as needed for pain management. I'll add Robaxin for muscle relaxant. -PT OT evaluation ordered.   # Enterococcus UTI: Patient with back pain and nausea. Follow-up CT scan abdomen pelvis. Follow-up final culture results and sensitivity. I'll start empiric Levaquin.  #Chronic systolic congestive heart failure: Patient looks euvolemic on exam. Continue current cardiac medications including Imdur, losartan, metoprolol, aspirin and statin.  #Hypothyroidism: Continue Synthroid. TSH level acceptable.  #Hyperlipidemia: Continue statin  #Biventricular ICD: Recommended to follow-up with her cardiologist outpatient.  #History of  osteoporosis: On prolia  # moderate protein calorie malnutrition: Dietary evaluation, continue Ensure  PT OT evaluation and follow-up pending test results. I discussed with the patient and her granddaughter at bedside in length.  DVT prophylaxis: Lovenox subcutaneous Code Status: DO NOT RESUSCITATE Family Communication: Discussed with the patient's granddaughter and great-granddaughter at bedside Disposition Plan: Likely discharge home in 1-2 days    Consultants:   None  Procedures: None Antimicrobials: levaquin  Subjective: Seen and examined at bedside. Reported nausea and no improvement in back pain. Denied headache, dizziness, chest pain, shortness of breath.  Objective: Vitals:   07/28/16 1949 07/29/16 0401 07/29/16 0846 07/29/16 1144  BP:  (!) 142/82  127/80  Pulse:  71  95  Resp:  18  20  Temp:  99 F (37.2 C)  98.1 F (36.7 C)  TempSrc:  Oral  Oral  SpO2: 95% 94% 95% 94%  Weight:  56.2 kg (123 lb 14.4 oz)    Height:        Intake/Output Summary (Last 24 hours) at 07/29/16 1253 Last data filed at 07/29/16 1103  Gross per 24 hour  Intake              840 ml  Output              600 ml  Net              240 ml   Filed Weights   07/27/16 2039 07/28/16 0110 07/29/16 0401  Weight: 54.9 kg (121 lb) 57.8 kg (127 lb 6.4 oz) 56.2 kg (123 lb 14.4 oz)    Examination:  General exam: Pleasant elderly female lying in bed comfortable  Respiratory system: Clear bilaterally, respiratory effort normal. No wheezing or crackle Cardiovascular system: Regular rate rhythm S1-S2 normal.  No pedal edema Gastrointestinal system: Abdomen soft, nontender nondistended. Bowel sound positive Central nervous system: Alert and oriented. No focal neurological deficits. Extremities: Symmetric 5 x 5 power. Skin: No rashes, lesions or ulcers Back: No swelling redness or tenderness on the back. No CVA tenderness    Data Reviewed: I have personally reviewed following labs and imaging  studies  CBC:  Recent Labs Lab 07/27/16 2041 07/28/16 0058  WBC 9.3 9.4  NEUTROABS 7.9*  --   HGB 13.7 14.1  HCT 41.6 41.4  MCV 90.8 90.6  PLT 177 168   Basic Metabolic Panel:  Recent Labs Lab 07/27/16 2041 07/28/16 0058  NA 134*  --   K 4.7  --   CL 100*  --   CO2 26  --   GLUCOSE 133*  --   BUN 14  --   CREATININE 0.82 0.99  CALCIUM 10.2  --   MG  --  2.0   GFR: Estimated Creatinine Clearance: 26.3 mL/min (by C-G formula based on SCr of 0.99 mg/dL). Liver Function Tests:  Recent Labs Lab 07/27/16 2041  AST 29  ALT 17  ALKPHOS 72  BILITOT 0.9  PROT 7.2  ALBUMIN 4.5   No results for input(s): LIPASE, AMYLASE in the last 168 hours. No results for input(s): AMMONIA in the last 168 hours. Coagulation Profile: No results for input(s): INR, PROTIME in the last 168 hours. Cardiac Enzymes:  Recent Labs Lab 07/28/16 0058 07/28/16 0343 07/28/16 0622  TROPONINI <0.03 <0.03 <0.03   BNP (last 3 results) No results for input(s): PROBNP in the last 8760 hours. HbA1C: No results for input(s): HGBA1C in the last 72 hours. CBG: No results for input(s): GLUCAP in the last 168 hours. Lipid Profile: No results for input(s): CHOL, HDL, LDLCALC, TRIG, CHOLHDL, LDLDIRECT in the last 72 hours. Thyroid Function Tests:  Recent Labs  07/28/16 0058  TSH 2.014   Anemia Panel: No results for input(s): VITAMINB12, FOLATE, FERRITIN, TIBC, IRON, RETICCTPCT in the last 72 hours. Sepsis Labs: No results for input(s): PROCALCITON, LATICACIDVEN in the last 168 hours.  Recent Results (from the past 240 hour(s))  Culture, Urine     Status: Abnormal (Preliminary result)   Collection Time: 07/27/16  9:25 PM  Result Value Ref Range Status   Specimen Description URINE, RANDOM  Final   Special Requests ADDED 0211 07/28/16  Final   Culture >=100,000 COLONIES/mL ENTEROCOCCUS FAECALIS (A)  Final   Report Status PENDING  Incomplete         Radiology Studies: Dg Chest 2  View  Result Date: 07/27/2016 CLINICAL DATA:  Initial evaluation for acute chest pain. EXAM: CHEST  2 VIEW COMPARISON:  Prior radiograph from 04/10/2016. FINDINGS: Left-sided pacemaker/ AICD in place, stable. Stable cardiomegaly. Mediastinal silhouette normal. Aortic atherosclerosis. Moderate to large hiatal hernia. Elevation of the right hemidiaphragm, similar to prior, likely chronic. Associated right basilar atelectasis. Scattered linear density at the left lung base also consistent with atelectasis. No focal infiltrates. No pulmonary edema or pleural effusion. No pneumothorax. No acute osseus abnormality. IMPRESSION: 1. Chronic elevation of the right hemidiaphragm and moderate hiatal hernia with associated bibasilar atelectasis, similar to previous. 2. No other active cardiopulmonary disease. 3. Stable cardiomegaly without pulmonary edema. 4. Aortic atherosclerosis. Electronically Signed   By: Rise Mu M.D.   On: 07/27/2016 22:05   Dg Ribs Bilateral  Result Date: 07/28/2016 CLINICAL DATA:  Bilateral chest wall pain. EXAM: BILATERAL RIBS - 3+ VIEW COMPARISON:  None. FINDINGS: No evidence  of rib fracture or focal lesion. IMPRESSION: Negative. Electronically Signed   By: Paulina FusiMark  Shogry M.D.   On: 07/28/2016 15:26   Dg Thoracic Spine 2 View  Result Date: 07/28/2016 CLINICAL DATA:  Chronic thoracic back pain. EXAM: THORACIC SPINE 2 VIEWS COMPARISON:  07/27/2016 FINDINGS: Mild thoracic curvature. No evidence of thoracic fracture or focal bone lesion. Extensive lumbar curvature with lower lumbar degenerative changes, not primarily or completely evaluated. IMPRESSION: Minimal thoracic curvature. No fracture or significant focal finding. Electronically Signed   By: Paulina FusiMark  Shogry M.D.   On: 07/28/2016 15:24   Dg Lumbar Spine 2-3 Views  Result Date: 07/28/2016 CLINICAL DATA:  Low back pain. EXAM: LUMBAR SPINE - 2-3 VIEW COMPARISON:  CT of the abdomen and pelvis on 04/09/2016 FINDINGS: Severe leftward  convex scoliosis of the lumbar spine noted with associated multilevel degenerative disc disease. No clear acute fracture. No bony lesions or destruction. Aortoiliac atherosclerosis with extensive calcified plaque. IMPRESSION: 1. Severe degenerative leftward convex scoliosis of the lumbar spine with advanced multilevel degenerative disc disease. 2. Atherosclerosis of the abdominal aorta and iliac arteries. Electronically Signed   By: Irish LackGlenn  Yamagata M.D.   On: 07/28/2016 15:05   Dg Hip Unilat With Pelvis 2-3 Views Right  Result Date: 07/28/2016 CLINICAL DATA:  Right lower back pain radiating to the right groin for 1 week. EXAM: DG HIP (WITH OR WITHOUT PELVIS) 2-3V RIGHT COMPARISON:  None. FINDINGS: No evidence of hip fracture or malalignment. No degenerative hip narrowing or spurring. Pelvic ring appears to be intact, limited over the sacrum and iliac wings due to bowel gas. Osteopenia and atherosclerosis. Scoliosis. IMPRESSION: No acute finding or degenerative hip narrowing. Electronically Signed   By: Marnee SpringJonathon  Watts M.D.   On: 07/28/2016 09:13        Scheduled Meds: . aspirin EC  81 mg Oral Daily  . budesonide  2 mL Inhalation BID  . enoxaparin (LOVENOX) injection  30 mg Subcutaneous Q24H  . feeding supplement (ENSURE ENLIVE)  237 mL Oral BID BM  . isosorbide mononitrate  30 mg Oral Daily  . levothyroxine  100 mcg Oral QAC breakfast  . losartan  50 mg Oral Daily  . metoprolol succinate  50 mg Oral Daily  . multivitamin  15 mL Oral Daily  . pravastatin  20 mg Oral QHS  . psyllium  1 packet Oral QHS   Continuous Infusions:   LOS: 0 days    Jackolyn Geron Jaynie CollinsPrasad Tove Wideman, MD [facility] Pager [phone]  If 7PM-7AM, please contact night-coverage [email or web address] Password [email or web address] 07/29/2016, 12:53 PM

## 2016-07-29 NOTE — Evaluation (Signed)
Occupational Therapy Evaluation Patient Details Name: Tracy MontanaDorothy G [name] MRN: [id number] DOB: Aug 11, 1921 Today's Date: 07/29/2016    History of Present Illness Patient presents to hospital with SOB, nausea and  low back pain. she has  long history of lower back pain. She has severe dgeneration and scoliosis. She contineus to report mild nausea. Her lowr back pain has improved. PMH: UTI, CHF, PVT, carsiomyopathy, GERD, DDD , Chest pain, arthritis, and anxiety   Clinical Impression   PTA, pt was living alone and was performing her ADLs, light IADLs, and driving; pt's granddaughter in law assists with other IADLs including money management. Pt performing ADLs and functional mobility at supervision-Min Guard A level with RW. Recommend dc home once medically stable per physician. Will continue to follow acutely as admitted to insure consistency of functional performance and facilitate safe dc home.     Follow Up Recommendations  No OT follow up;Supervision - Intermittent    Equipment Recommendations  None recommended by OT    Recommendations for Other Services PT consult     Precautions / Restrictions Precautions Precautions: ICD/Pacemaker Restrictions Weight Bearing Restrictions: No      Mobility Bed Mobility Overal bed mobility: Modified Independent             General bed mobility comments: Increased time and HOB elevated  Transfers Overall transfer level: Modified independent Equipment used: Rolling walker (2 wheeled)             General transfer comment: Np physical A needed. Able to navigate with RW and demonstrates safe technique    Balance Overall balance assessment: Modified Independent                                         ADL either performed or assessed with clinical judgement   ADL Overall ADL's : Needs assistance/impaired Eating/Feeding: Independent   Grooming: Wash/dry hands;Supervision/safety;Standing   Upper Body Bathing:  Supervision/ safety;Sitting   Lower Body Bathing: Min guard;Sit to/from stand   Upper Body Dressing : Supervision/safety;Sitting   Lower Body Dressing: Min guard;Sit to/from stand   Toilet Transfer: Supervision/safety;Ambulation;RW;Regular Toilet   Toileting- ArchitectClothing Manipulation and Hygiene: Supervision/safety;Sit to/from stand       Functional mobility during ADLs: Min guard;Rolling walker (Walked down hall to get new toothpaste without any fatigue) General ADL Comments: Pt performing ADLs and functional mobility at MotorolaMin guard A-supervision level with increased time and RW     Vision         Perception     Praxis      Pertinent Vitals/Pain Pain Assessment: Faces Faces Pain Scale: Hurts a little bit Pain Descriptors / Indicators: Aching Pain Intervention(s): Monitored during session;Repositioned     Hand Dominance Right   Extremity/Trunk Assessment Upper Extremity Assessment Upper Extremity Assessment: Overall WFL for tasks assessed   Lower Extremity Assessment Lower Extremity Assessment: Overall WFL for tasks assessed   Cervical / Trunk Assessment Cervical / Trunk Assessment: Other exceptions (Scoliosis) Cervical / Trunk Exceptions: Scoliosis   Communication Communication Communication: No difficulties   Cognition Arousal/Alertness: Awake/alert Behavior During Therapy: WFL for tasks assessed/performed Overall Cognitive Status: Within Functional Limits for tasks assessed                                 General Comments: Very pleasent. Making jokes with staff and says "  what is life without laughs and fun"   General Comments       Exercises     Shoulder Instructions      Home Living Family/patient expects to be discharged to:: Private residence Living Arrangements: Alone Available Help at Discharge: Family Type of Home: House       Home Layout: Two level;Other (Comment) Advertising account executive)     Bathroom Shower/Tub: Scientist, research (life sciences): Handicapped height     Home Equipment: Emergency planning/management officer - 2 wheels          Prior Functioning/Environment Level of Independence: Independent        Comments: Patient used rollator walker prior to hospitialization. She still drives and is indepndent with ADLs and all house work despite lower back pain. Granddaughter in law assists with finances and some IADLs        OT Problem List: Decreased activity tolerance;Pain;Decreased knowledge of use of DME or AE      OT Treatment/Interventions: Self-care/ADL training;Therapeutic exercise;Energy conservation;DME and/or AE instruction;Therapeutic activities;Patient/family education    OT Goals(Current goals can be found in the care plan section) Acute Rehab OT Goals Patient Stated Goal: to go home OT Goal Formulation: With patient Time For Goal Achievement: 08/12/16 Potential to Achieve Goals: Good ADL Goals Pt Will Perform Grooming: with modified independence;standing Pt Will Perform Upper Body Dressing: with modified independence;sitting Pt Will Perform Lower Body Dressing: with modified independence;sit to/from stand Pt Will Transfer to Toilet: with modified independence;regular height toilet;ambulating Pt Will Perform Toileting - Clothing Manipulation and hygiene: with modified independence;sit to/from stand  OT Frequency: Min 1X/week   Barriers to D/C:            Co-evaluation              AM-PAC PT "6 Clicks" Daily Activity     Outcome Measure Help from another person eating meals?: None Help from another person taking care of personal grooming?: None Help from another person toileting, which includes using toliet, bedpan, or urinal?: A Little Help from another person bathing (including washing, rinsing, drying)?: A Little Help from another person to put on and taking off regular upper body clothing?: None Help from another person to put on and taking off regular lower body clothing?: A  Little 6 Click Score: 21   End of Session Equipment Utilized During Treatment: Gait belt;Rolling walker Nurse Communication: Mobility status  Activity Tolerance: Patient tolerated treatment well Patient left: in chair;with call bell/phone within reach;with chair alarm set  OT Visit Diagnosis: Unsteadiness on feet (R26.81);Other abnormalities of gait and mobility (R26.89)                Time: 1350-1411 OT Time Calculation (min): 21 min Charges:  OT General Charges $OT Visit: 1 Procedure OT Evaluation $OT Eval Low Complexity: 1 Procedure G-Codes: OT G-codes **NOT FOR INPATIENT CLASS** Functional Assessment Tool Used: Clinical judgement Functional Limitation: Self care Self Care Current Status (Z6109): At least 1 percent but less than 20 percent impaired, limited or restricted Self Care Goal Status (U0454): 0 percent impaired, limited or restricted   [name] MSOT, OTR/L Acute Rehab Pager: [phone] Office: [phone]  [name] 07/29/2016, 3:05 PM

## 2016-07-29 NOTE — Progress Notes (Signed)
Echocardiogram complete  

## 2016-07-30 DIAGNOSIS — N1330 Unspecified hydronephrosis: Secondary | ICD-10-CM

## 2016-07-30 LAB — URINE CULTURE

## 2016-07-30 MED ORDER — TRAMADOL HCL 50 MG PO TABS: 50.0000 mg | ORAL_TABLET | Freq: Two times a day (BID) | ORAL | 0 refills | Status: DC | PRN

## 2016-07-30 MED ORDER — ONDANSETRON HCL 4 MG PO TABS: 4.0000 mg | ORAL_TABLET | Freq: Three times a day (TID) | ORAL | 0 refills | Status: DC | PRN

## 2016-07-30 MED ORDER — FUROSEMIDE 20 MG PO TABS: 20.0000 mg | ORAL_TABLET | Freq: Every day | ORAL | 0 refills | Status: AC | PRN

## 2016-07-30 MED ORDER — AMOXICILLIN 500 MG PO TABS: 500.0000 mg | ORAL_TABLET | Freq: Two times a day (BID) | ORAL | 0 refills | Status: AC

## 2016-07-30 MED ORDER — AMOXICILLIN 250 MG PO CAPS
250.0000 mg | ORAL_CAPSULE | Freq: Two times a day (BID) | ORAL | Status: DC
  Administered 2016-07-30: 250 mg via ORAL
  Filled 2016-07-30: qty 1

## 2016-07-30 NOTE — Consult Note (Signed)
   [facility]   07/30/2016  [name] [birth year] [id number]   [facility] needs. Patient has been assigned EMMI HF follow up by inpatient RNCM, Brenda C.  Patient is noted to have Dr. Seabron SpatesYvonne Lowne Chase as [facility]  This practice generally provides their post transition of care follow up for their client/patients.  For questions, please contact:  Charlesetta ShanksVictoria Cherlyn Syring, RN BSN CCM [facility] Liaison  [phone] business mobile phone Toll free office [phone]

## 2016-07-30 NOTE — Care Management Note (Signed)
Case Management Note  Patient Details  Name: [name] MRN: [id number] Date of Birth: May 13, 1921  Subjective/Objective:  Chest Pressure                Action/Plan: Patient lives at home; PCP: Zola ButtonLowne Chase, Grayling CongressYvonne R, DO; has private insurance with Medicare; independent at home, no needs identified at this time; DME- RW and cane at home.  Expected Discharge Date:  07/30/16               Expected Discharge Plan:  Home/Self Care  In-House Referral:   [facility]  Discharge planning Services  CM Consult  Status of Service:  Completed, signed off  Reola MosherChandler, Soloman Mckeithan L, RN,MHA,BSN [phone] 07/30/2016, 10:44 AM

## 2016-07-30 NOTE — Progress Notes (Addendum)
Patient states she wore CPAP last night but it was uncomfortable and it scared her. She is refusing CPAP tonight. Patient is in no distress at this time.

## 2016-07-30 NOTE — Progress Notes (Signed)
I'm following this patient for right hydro.  She also has a enterococcus UTI.  S: Feeling some discomfort, easily treated with pain medication.  Less nausea. Overall she feels better then she did yesterday - significantly better than Friday.  O: Vitals:   07/29/16 1938 07/29/16 1958 07/30/16 0500 07/30/16 0634  BP:  113/64  136/73  Pulse:  79  81  Resp:  20  20  Temp:  98.5 F (36.9 C)  97.9 F (36.6 C)  TempSrc:  Oral  Oral  SpO2: 94% 94%  93%  Weight:   57.3 kg (126 lb 4.8 oz)   Height:        Intake/Output Summary (Last 24 hours) at 07/30/16 16100727 Last data filed at 07/30/16 [id number]  Gross per 24 hour  Intake             1200 ml  Output             1000 ml  Net              200 ml  NAD Alert and oriented Mild right CVA tenderness  Urine cx: Enterococcus CT - mild right hydro with no obvious obstruction  A: 13F with resolving symtoms from ? Passage of a stone.  She has bacturia, don't think she has pyelo given no fevers.  This is now being treated with Amoxicillin.  P: Culture specific abx x 7 days, conservative management for hydro.  Will plan to follow-up in clinic with a repeat u/s in 6 weeks.  Please page with questions/concerns.

## 2016-07-30 NOTE — Progress Notes (Signed)
Pt has orders to be discharged. Discharge instructions given and pt has no additional questions at this time. Medication regimen reviewed and pt educated. Pt verbalized understanding and has no additional questions. Telemetry box removed. IV removed and site in good condition. Pt stable and waiting for transportation.  [employer] RN 

## 2016-07-30 NOTE — Discharge Summary (Signed)
Physician Discharge Summary  [name] JYN:[id number] DOB: 03-12-1921 DOA: 07/27/2016  PCP: Donato Schultz, DO  Admit date: 07/27/2016 Discharge date: 07/30/2016  Admitted From:home Disposition:home  Recommendations for Outpatient Follow-up:  1. Follow up with PCP in 1-2 weeks 2. Please obtain BMP/CBC in one week  Home Health:no Equipment/Devices:no Discharge Condition:stable CODE STATUS:DNR Diet recommendation:heart healthy  Brief/Interim Summary: 83 y.o.femalewith medical history significant of ICM with systolic CHF (last TTE in our system was in 2008, EF of 40%), pacemaker in place, OSA on CPAP, hypothyroidism, HTN, HLD, GERD, CAD who presents for weakness and chest tightness. Also complaining of right lower back. In the ER patient received a dose of IV Lasix for mildly elevated BNP.  # Chest pain/shortness of breath:  -Troponin negative, EKG unchanged. Chest x-ray with no acute finding.  -Patient denied chest pain, shortness of breath. -Echocardiogram with EF 45-50 % -Continue aspirin, statin, imdur  #Right lower back/hip pain/flank pain  due to new moderate right-sided hydronephrosis. -X-ray of the back and hips consistent with the scoliosis and the degenerative disc disease.  -Urine culture growing enterococcus which is pansensitive. Workup for back pain consistent with CT scan of abdomen and pelvis which showed new right-sided hydronephrosis. No definite if ureteral stone identified. The findings suggest that patient may have passed a stone recently. Evaluated by urologist recommended to treat UTI and outpatient follow-up with urologist. Patient's pain is better but he still has intermittent pain. She reported that Ultram is helping her for the pain management. Prescription for Ultram as needed provided to the patient. I recommended patient to follow-up with her PCP and neurologist outpatient. I discussed with her granddaughter at bedside at length. Discharge with  7 days of oral antibiotics.  # Enterococcus UTI: Oral amoxicillin for 7 days on discharge.  #Chronic systolic congestive heart failure: Patient looks euvolemic on exam. Continue home medication.  #Hypothyroidism: Continue Synthroid. TSH level acceptable.  #Hyperlipidemia: Continue statin  #Biventricular ICD: Recommended to follow-up with her cardiologist outpatient.  #History of osteoporosis: On prolia  # moderate protein calorie malnutrition: Dietary evaluation, continue Ensure  Patient was encouraged to increase oral intake. Discharge with oral Zofran as needed for the management of nausea. Patient and her granddaughter verbalize understanding of follow-up instructions.  Discharge Diagnoses:  Active Problems:   Hyperlipidemia   Chronic systolic heart failure (HCC)   Biventricular implantable cardioverter-defibrillator in situ   Chest pain   Hypothyroid   Osteoporosis   Back pain   Hip pain   OSA (obstructive sleep apnea)   CAD (coronary artery disease)   Chest pressure   Malnutrition of moderate degree   Enterococcus UTI    Discharge Instructions  Discharge Instructions    Call MD for:  difficulty breathing, headache or visual disturbances    Complete by:  As directed    Call MD for:  extreme fatigue    Complete by:  As directed    Call MD for:  hives    Complete by:  As directed    Call MD for:  persistant dizziness or light-headedness    Complete by:  As directed    Call MD for:  persistant nausea and vomiting    Complete by:  As directed    Call MD for:  severe uncontrolled pain    Complete by:  As directed    Call MD for:  temperature >100.4    Complete by:  As directed    Diet - low sodium heart healthy  Complete by:  As directed    Discharge instructions    Complete by:  As directed    Please contact your PCP or urologist or come to ER if you have fever, worsening pain or with any new symptoms.   Increase activity slowly    Complete by:  As  directed      Allergies as of 07/30/2016      Reactions   Sulfonamide Derivatives Itching, Rash      Medication List    TAKE these medications   albuterol 108 (90 Base) MCG/ACT inhaler Commonly known as:  PROVENTIL HFA;VENTOLIN HFA Inhale 2 puffs into the lungs every 6 (six) hours as needed for wheezing or shortness of breath.   amoxicillin 500 MG tablet Commonly known as:  AMOXIL Take 1 tablet (500 mg total) by mouth 2 (two) times daily.   ASPIR-81 81 MG EC tablet Generic drug:  aspirin Take 81 mg by mouth daily.   beclomethasone 40 MCG/ACT inhaler Commonly known as:  QVAR Inhale 2 puffs into the lungs 2 (two) times daily. What changed:  when to take this  reasons to take this   diphenhydrAMINE 25 mg capsule Commonly known as:  BENADRYL Take 25 mg by mouth every 6 (six) hours as needed for itching or allergies.   furosemide 20 MG tablet Commonly known as:  LASIX Take 1 tablet (20 mg total) by mouth daily as needed for fluid or edema. What changed:  when to take this  reasons to take this   isosorbide mononitrate 30 MG 24 hr tablet Commonly known as:  IMDUR TAKE ONE & ONE-HALF TABLETS BY MOUTH ONCE DAILY IN THE MORNING   levothyroxine 100 MCG tablet Commonly known as:  SYNTHROID, LEVOTHROID TAKE 1 TABLET BY MOUTH ONCE DAILY BEFORE BREAKFAST   losartan 50 MG tablet Commonly known as:  COZAAR TAKE 1 TABLET BY MOUTH ONCE DAILY (YOU NEED APPOINTMENT FOR MORE REFILLS)   METAFIBER 48.57 % Powd Generic drug:  Psyllium Take 10 mLs by mouth every evening. Mixed with water   metoprolol succinate 50 MG 24 hr tablet Commonly known as:  TOPROL-XL TAKE ONE TABLET BY MOUTH TWICE DAILY WITH  OR  IMMEDIATELY  FOLLOWING  A  MEAL What changed:  See the new instructions.   nitroGLYCERIN 0.4 MG SL tablet Commonly known as:  NITROSTAT Place 1 tablet (0.4 mg total) under the tongue every 5 (five) minutes as needed. At the onset of chest pain, Up to 3 doses   NON  FORMULARY C-Pap machine, uses nightly for sleep apnea   ondansetron 4 MG tablet Commonly known as:  ZOFRAN Take 1 tablet (4 mg total) by mouth every 8 (eight) hours as needed for nausea or vomiting.   pravastatin 20 MG tablet Commonly known as:  PRAVACHOL TAKE ONE TABLET BY MOUTH ONCE DAILY   SALONPAS PAIN RELIEF PATCH EX Apply 1 patch topically daily as needed (pain).   traMADol 50 MG tablet Commonly known as:  ULTRAM Take 1 tablet (50 mg total) by mouth every 12 (twelve) hours as needed for severe pain.      Follow-up Information    Crist FatHerrick, Benjamin W, MD Follow up in 6 week(s).   Specialty:  Urology Why:  with ultrasound of kidneys. Contact information: [address] [place] [address] [phone]        Donato SchultzLowne Chase, Yvonne R, DO. Schedule an appointment as soon as possible for a visit in 1 week(s).   Specialty:  Family Medicine  Contact information: [address] [facility] [address] [place] [address] [phone]          Allergies  Allergen Reactions  . Sulfonamide Derivatives Itching and Rash    Consultations: Urology  Procedures/Studies: CT scan abdomen pelvis  Subjective: Seen and examined at bedside.  Reported well. Had a good night sleep. Denied headache, dizziness, nausea vomiting chest pain shortness of breath. Reported intermittent right flank pain which is gradually improving.  Discharge Exam: Vitals:   07/29/16 1958 07/30/16 0634  BP: 113/64 136/73  Pulse: 79 81  Resp: 20 20  Temp: 98.5 F (36.9 C) 97.9 F (36.6 C)   Vitals:   07/29/16 1938 07/29/16 1958 07/30/16 0500 07/30/16 0634  BP:  113/64  136/73  Pulse:  79  81  Resp:  20  20  Temp:  98.5 F (36.9 C)  97.9 F (36.6 C)  TempSrc:  Oral  Oral  SpO2: 94% 94%  93%  Weight:   57.3 kg (126 lb 4.8 oz)   Height:        General: Pleasant elderly female lying on bed comfortable ,Pt is alert, awake, not in acute distress Cardiovascular: RRR, S1/S2 +, no rubs, no  gallops Respiratory: CTA bilaterally, no wheezing, no rhonchi Abdominal: Soft, NT, ND, bowel sounds + Extremities: no edema, no cyanosis No flank tenderness or CVA tenderness.   The results of significant diagnostics from this hospitalization (including imaging, microbiology, ancillary and laboratory) are listed below for reference.     Microbiology: Recent Results (from the past 240 hour(s))  Culture, Urine     Status: Abnormal   Collection Time: 07/27/16  9:25 PM  Result Value Ref Range Status   Specimen Description URINE, RANDOM  Final   Special Requests ADDED 0211 07/28/16  Final   Culture >=100,000 COLONIES/mL ENTEROCOCCUS FAECALIS (A)  Final   Report Status 07/30/2016 FINAL  Final   Organism ID, Bacteria ENTEROCOCCUS FAECALIS (A)  Final      Susceptibility   Enterococcus faecalis - MIC*    AMPICILLIN <=2 SENSITIVE Sensitive     LEVOFLOXACIN 1 SENSITIVE Sensitive     NITROFURANTOIN <=16 SENSITIVE Sensitive     VANCOMYCIN 2 SENSITIVE Sensitive     * >=100,000 COLONIES/mL ENTEROCOCCUS FAECALIS     Labs: BNP (last 3 results)  Recent Labs  07/27/16 2041  BNP 603.4*   Basic Metabolic Panel:  Recent Labs Lab 07/27/16 2041 07/28/16 0058  NA 134*  --   K 4.7  --   CL 100*  --   CO2 26  --   GLUCOSE 133*  --   BUN 14  --   CREATININE 0.82 0.99  CALCIUM 10.2  --   MG  --  2.0   Liver Function Tests:  Recent Labs Lab 07/27/16 2041  AST 29  ALT 17  ALKPHOS 72  BILITOT 0.9  PROT 7.2  ALBUMIN 4.5   No results for input(s): LIPASE, AMYLASE in the last 168 hours. No results for input(s): AMMONIA in the last 168 hours. CBC:  Recent Labs Lab 07/27/16 2041 07/28/16 0058  WBC 9.3 9.4  NEUTROABS 7.9*  --   HGB 13.7 14.1  HCT 41.6 41.4  MCV 90.8 90.6  PLT 177 168   Cardiac Enzymes:  Recent Labs Lab 07/28/16 0058 07/28/16 0343 07/28/16 0622  TROPONINI <0.03 <0.03 <0.03   BNP: Invalid input(s): POCBNP CBG: No results for input(s): GLUCAP in the  last 168 hours. D-Dimer No results for input(s):  DDIMER in the last 72 hours. Hgb A1c No results for input(s): HGBA1C in the last 72 hours. Lipid Profile No results for input(s): CHOL, HDL, LDLCALC, TRIG, CHOLHDL, LDLDIRECT in the last 72 hours. Thyroid function studies  Recent Labs  07/28/16 0058  TSH 2.014   Anemia work up No results for input(s): VITAMINB12, FOLATE, FERRITIN, TIBC, IRON, RETICCTPCT in the last 72 hours. Urinalysis    Component Value Date/Time   COLORURINE STRAW (A) 07/27/2016 2125   APPEARANCEUR CLEAR 07/27/2016 2125   LABSPEC 1.009 07/27/2016 2125   PHURINE 7.0 07/27/2016 2125   GLUCOSEU NEGATIVE 07/27/2016 2125   HGBUR NEGATIVE 07/27/2016 2125   BILIRUBINUR NEGATIVE 07/27/2016 2125   BILIRUBINUR neg 09/20/2015 1708   KETONESUR NEGATIVE 07/27/2016 2125   PROTEINUR NEGATIVE 07/27/2016 2125   UROBILINOGEN 0.2 09/20/2015 1708   UROBILINOGEN 0.2 04/01/2010 1425   NITRITE NEGATIVE 07/27/2016 2125   LEUKOCYTESUR NEGATIVE 07/27/2016 2125   Sepsis Labs Invalid input(s): PROCALCITONIN,  WBC,  LACTICIDVEN Microbiology Recent Results (from the past 240 hour(s))  Culture, Urine     Status: Abnormal   Collection Time: 07/27/16  9:25 PM  Result Value Ref Range Status   Specimen Description URINE, RANDOM  Final   Special Requests ADDED 0211 07/28/16  Final   Culture >=100,000 COLONIES/mL ENTEROCOCCUS FAECALIS (A)  Final   Report Status 07/30/2016 FINAL  Final   Organism ID, Bacteria ENTEROCOCCUS FAECALIS (A)  Final      Susceptibility   Enterococcus faecalis - MIC*    AMPICILLIN <=2 SENSITIVE Sensitive     LEVOFLOXACIN 1 SENSITIVE Sensitive     NITROFURANTOIN <=16 SENSITIVE Sensitive     VANCOMYCIN 2 SENSITIVE Sensitive     * >=100,000 COLONIES/mL ENTEROCOCCUS FAECALIS     Time coordinating discharge: 32 minutes  SIGNED:   Maxie Barb, MD  [facility] 07/30/2016, 10:41 AM  If 7PM-7AM, please contact  night-coverage [email or web address] Password [email or web address]

## 2016-08-01 ENCOUNTER — Telehealth: Payer: Self-pay

## 2016-08-01 NOTE — Telephone Encounter (Signed)
08/01/16   Transition Care Management Follow-up Telephone Call  ADMISSION DATE:  07/27/16  DISCHARGE DATE: 07/30/16   How have you been since you were released from the hospital?  Doing better per patient. States she does have some pain in back but not as bad as before she went to hospital.   Do you understand why you were in the hospital?  Yes   Do you understand the discharge instructions? Yes with granddaughters help    Items Reviewed:  Medications reviewed: Unable to at this time because granddaughter not home will need her help.   Allergies reviewed:  Yes   Dietary changes reviewed: Low sodium heart healthy   Referrals reviewed: appointment scheduled for follow up   Functional Questionnaire:   Activities of Daily Living (ALS): Able to perform per patient  Any patient concerns?  Would like to know status of health.   Confirmed importance and date/time of follow-up visits scheduled: Yes   Confirmed with patient if condition begins to worsen call PCP or go to the ER.  Yes    Patient was given the office number and encouraged to call back with questions or concerns. Yes

## 2016-08-03 ENCOUNTER — Other Ambulatory Visit: Payer: Self-pay | Admitting: *Deleted

## 2016-08-03 ENCOUNTER — Encounter: Payer: Self-pay | Admitting: *Deleted

## 2016-08-03 NOTE — Patient Outreach (Signed)
[facility] Care Management  08/03/2016  Tracy MontanaDorothy G [name] December 14, 1921 [id number]  EMMI-Heart Failure -Red Alert referral Day#2 08/02/2016 Reason: Weighed themselves today-No  Call #1 to patient who gave HIPPA verification. Patient voices that she is weighing daily but call was received before she weighed on the day in question. States weighs 122 lbs and that weight has not changed all week. Voices she understands that she needs to call MD if she puts on several pounds in 24 hour time period. Patient also advised that if she has 5 pound weight gain in one week she needs to advise MD. States she is currently not having any shortness of breath or swelling in legs, feet or other areas.  Patient states she has all of her medications & takes them as prescribed by her MD. States her grand daughter has made a medication chart & she crosses out the slot when she has taken medication.   Patient voices she has follow up MD appointment 7/30 with primary care. States she plans to call cardiology office to verify appointment date. States she will have transportation.   Patient has no concerns at this time. EMMI alert has been addressed.  Plan: Will send EMMI Heart Failure literature as a means of re-enforcement for patient already knows. Will send to care management assistant.  Colleen CanLinda Demontae Antunes, RN BSN CCM Care Management Coordinator [facility] Care Management  [phone]

## 2016-08-04 NOTE — Progress Notes (Signed)
Late entry for coding clarification. The principal diagnosis of the patient is New right sided moderate hydronephrosis.

## 2016-08-06 ENCOUNTER — Encounter: Payer: Self-pay | Admitting: Family Medicine

## 2016-08-06 ENCOUNTER — Other Ambulatory Visit: Payer: Self-pay | Admitting: *Deleted

## 2016-08-06 ENCOUNTER — Ambulatory Visit (INDEPENDENT_AMBULATORY_CARE_PROVIDER_SITE_OTHER): Payer: Medicare Other | Admitting: Family Medicine

## 2016-08-06 VITALS — BP 108/62 | HR 65 | Temp 98.2°F | Ht 59.0 in | Wt 127.2 lb

## 2016-08-06 DIAGNOSIS — N200 Calculus of kidney: Secondary | ICD-10-CM | POA: Diagnosis not present

## 2016-08-06 DIAGNOSIS — I509 Heart failure, unspecified: Secondary | ICD-10-CM

## 2016-08-06 DIAGNOSIS — N3001 Acute cystitis with hematuria: Secondary | ICD-10-CM

## 2016-08-06 DIAGNOSIS — I255 Ischemic cardiomyopathy: Secondary | ICD-10-CM | POA: Diagnosis not present

## 2016-08-06 LAB — BASIC METABOLIC PANEL
BUN: 22 mg/dL (ref 6–23)
CALCIUM: 10.5 mg/dL (ref 8.4–10.5)
CO2: 30 mEq/L (ref 19–32)
Chloride: 99 mEq/L (ref 96–112)
Creatinine, Ser: 0.91 mg/dL (ref 0.40–1.20)
GFR: 61.28 mL/min (ref >60.00)
GLUCOSE: 97 mg/dL (ref 70–99)
POTASSIUM: 5 meq/L (ref 3.5–5.1)
SODIUM: 134 meq/L — AB (ref 135–145)

## 2016-08-06 LAB — POC URINALSYSI DIPSTICK (AUTOMATED)
Bilirubin, UA: NEGATIVE
GLUCOSE UA: NEGATIVE
Ketones, UA: NEGATIVE
LEUKOCYTES UA: NEGATIVE
NITRITE UA: NEGATIVE
PH UA: 6 (ref 5.0–8.0)
Protein, UA: NEGATIVE
RBC UA: NEGATIVE
SPEC GRAV UA: 1.02 (ref 1.010–1.025)
Urobilinogen, UA: 0.2 E.U./dL

## 2016-08-06 LAB — CBC WITH DIFFERENTIAL/PLATELET
BASOS ABS: 0.1 10*3/uL (ref 0.0–0.1)
Basophils Relative: 1.1 % (ref 0.0–3.0)
EOS PCT: 6.6 % — AB (ref 0.0–5.0)
Eosinophils Absolute: 0.5 10*3/uL (ref 0.0–0.7)
HEMATOCRIT: 41.1 % (ref 36.0–46.0)
Hemoglobin: 13.7 g/dL (ref 12.0–15.0)
LYMPHS ABS: 1.6 10*3/uL (ref 0.7–4.0)
LYMPHS PCT: 23.6 % (ref 12.0–46.0)
MCHC: 33.2 g/dL (ref 30.0–36.0)
MCV: 92.7 fl (ref 78.0–100.0)
MONOS PCT: 13.2 % — AB (ref 3.0–12.0)
Monocytes Absolute: 0.9 10*3/uL (ref 0.1–1.0)
NEUTROS ABS: 3.8 10*3/uL (ref 1.4–7.7)
NEUTROS PCT: 55.5 % (ref 43.0–77.0)
Platelets: 239 10*3/uL (ref 150.0–400.0)
RBC: 4.44 Mil/uL (ref 3.87–5.11)
RDW: 12.8 % (ref 11.5–15.5)
WBC: 6.9 10*3/uL (ref 4.0–10.5)

## 2016-08-06 NOTE — Progress Notes (Signed)
Patient ID: [name], female    DOB: December 20, 1921  Age: 83 y.o. MRN: [id number]    Subjective:  Subjective  HPI [name] presents for hosp f/u for chf , uti and kidney stone.  Pt is feeling much better.  She just feels tired.  Pt was d/c 7/23 and was admitted 7/20 Pt grand daughter is with her and states that she did get confused in the hospital and today is the GD first day back with her so the are hoping that being back in her routine will help this.    Review of Systems  Constitutional: Positive for fatigue. Negative for activity change, appetite change and unexpected weight change.  Respiratory: Negative for cough and shortness of breath.   Cardiovascular: Negative for chest pain and palpitations.  Psychiatric/Behavioral: Negative for behavioral problems and dysphoric mood. The patient is not nervous/anxious.     History Past Medical History:  Diagnosis Date  . Anxiety    sleep  . Arthritis   . Automatic implantable cardioverter/defibrillator (AICD) activation    s/p implantable cardioverter-defibrillator bivenrticular pacer. 12/05. St, Jude [employer]. remote-no.  [name] CAD (coronary artery disease)    a. s/p prior anterior wall myocardial infarction and multiple percutaneous coronary intervention.;  b. Aden MV 1/14:  low risk, inf and apical defect likely due to atten artifact, no ischemia, EF 35% (visually normal and est 55-60%)  . Chest pain 12/09   recent hospitalization for it. felt to be noncardiac.   [name] CHF (congestive heart failure) (HCC)   . DD (diverticular disease)    Hx of it - severe. Left colon 2004  . Esophageal stricture    last dialated 2007  . GERD (gastroesophageal reflux disease)   . Hiatal hernia   . HTN (hypertension)   . Hyperlipidemia   . Hypothyroidism   . Ischemic cardiomyopathy    EF 25-30% improved to 45% with biventricular pacing.   [name] PVT (paroxysmal ventricular tachycardia) (HCC)   . Sleep apnea    has a Cpap machine  . Systolic heart  failure    improved to class I to II   . UTI (lower urinary tract infection)    recurrent. (Klebsiella pneumoniae). Last Cx 02/03/09. (R-ancef, nitrofurantoin, Augmentin, Zyosyn)    She has a past surgical history that includes Angioplasty; unspecified area hysterectomy; Tubal ligation; Appendectomy; Cardiac defibrillator placement; Cataract extraction, bilateral; Total knee arthroplasty; Tonsillectomy; Hammertoe reconstruction with weil osteotomy (Right, 07/17/2012); Colonoscopy w/ polypectomy; Cardiac catheterization; Insert / replace / remove pacemaker; Hammer toe surgery (Left, 01/15/2013); Weil osteotomy (Left, 01/15/2013); and Cardiac catheterization (N/A, 12/05/2015).   Her family history includes Alcohol abuse in her son; Brain cancer in her son; Breast cancer in her daughter; Colon polyps in her sister; Diabetes in her daughter and unknown relative; Heart disease in her brother, father, mother, and sister; Lung cancer in her son; Prostate cancer in her brother.She reports that she has never smoked. She has never used smokeless tobacco. She reports that she does not drink alcohol or use drugs.  Current Outpatient Prescriptions on File Prior to Visit  Medication Sig Dispense Refill  . albuterol (PROVENTIL HFA;VENTOLIN HFA) 108 (90 Base) MCG/ACT inhaler Inhale 2 puffs into the lungs every 6 (six) hours as needed for wheezing or shortness of breath. 1 Inhaler 2  . amoxicillin (AMOXIL) 500 MG tablet Take 1 tablet (500 mg total) by mouth 2 (two) times daily. 14 tablet 0  . aspirin (ASPIR-81) 81 MG EC tablet Take 81 mg  by mouth daily.      . beclomethasone (QVAR) 40 MCG/ACT inhaler Inhale 2 puffs into the lungs 2 (two) times daily. (Patient taking differently: Inhale 2 puffs into the lungs 2 (two) times daily as needed (shortness of breath). ) 1 Inhaler 3  . diphenhydrAMINE (BENADRYL) 25 mg capsule Take 25 mg by mouth every 6 (six) hours as needed for itching or allergies.    . furosemide (LASIX) 20 MG  tablet Take 1 tablet (20 mg total) by mouth daily as needed for fluid or edema. 30 tablet 0  . isosorbide mononitrate (IMDUR) 30 MG 24 hr tablet TAKE ONE & ONE-HALF TABLETS BY MOUTH ONCE DAILY IN THE MORNING 45 tablet 5  . levothyroxine (SYNTHROID, LEVOTHROID) 100 MCG tablet TAKE 1 TABLET BY MOUTH ONCE DAILY BEFORE BREAKFAST 30 tablet 0  . Liniments (SALONPAS PAIN RELIEF PATCH EX) Apply 1 patch topically daily as needed (pain).     [name]. losartan (COZAAR) 50 MG tablet TAKE 1 TABLET BY MOUTH ONCE DAILY (YOU NEED APPOINTMENT FOR MORE REFILLS) 90 tablet 0  . metoprolol succinate (TOPROL-XL) 50 MG 24 hr tablet TAKE ONE TABLET BY MOUTH TWICE DAILY WITH  OR  IMMEDIATELY  FOLLOWING  A  MEAL (Patient taking differently: TAKE1/2 TABLET BY MOUTH TWICE DAILY WITH  OR  IMMEDIATELY  FOLLOWING  A  MEAL) 60 tablet 5  . nitroGLYCERIN (NITROSTAT) 0.4 MG SL tablet Place 1 tablet (0.4 mg total) under the tongue every 5 (five) minutes as needed. At the onset of chest pain, Up to 3 doses 25 tablet 1  . NON FORMULARY C-Pap machine, uses nightly for sleep apnea    . ondansetron (ZOFRAN) 4 MG tablet Take 1 tablet (4 mg total) by mouth every 8 (eight) hours as needed for nausea or vomiting. 20 tablet 0  . pravastatin (PRAVACHOL) 20 MG tablet TAKE ONE TABLET BY MOUTH ONCE DAILY 90 tablet 0  . Psyllium (METAFIBER) 48.57 % POWD Take 10 mLs by mouth every evening. Mixed with water    . traMADol (ULTRAM) 50 MG tablet Take 1 tablet (50 mg total) by mouth every 12 (twelve) hours as needed for severe pain. 15 tablet 0   No current facility-administered medications on file prior to visit.      Objective:  Objective  Physical Exam  Constitutional: She is oriented to person, place, and time. She appears well-developed and well-nourished.  HENT:  Head: Normocephalic and atraumatic.  Eyes: Conjunctivae and EOM are normal.  Neck: Normal range of motion. Neck supple. No JVD present. Carotid bruit is not present. No thyromegaly present.    Cardiovascular: Normal rate, regular rhythm and normal heart sounds.   No murmur heard. Pulmonary/Chest: Effort normal and breath sounds normal. No respiratory distress. She has no wheezes. She has no rales. She exhibits no tenderness.  Musculoskeletal: She exhibits no edema.  Neurological: She is alert and oriented to person, place, and time.  Psychiatric: She has a normal mood and affect. Her behavior is normal. Judgment and thought content normal.  Nursing note and vitals reviewed.  BP 108/62 (BP Location: Left Arm, Patient Position: Sitting, Cuff Size: Normal)   Pulse 65   Temp 98.2 F (36.8 C) (Oral)   Ht 4\' 11"  (1.499 m)   Wt 127 lb 4 oz (57.7 kg)   SpO2 97%   BMI 25.70 kg/m  Wt Readings from Last 3 Encounters:  08/06/16 127 lb 4 oz (57.7 kg)  07/30/16 126 lb 4.8 oz (57.3 kg)  05/17/16  129 lb (58.5 kg)     Lab Results  Component Value Date   WBC 9.4 07/28/2016   HGB 14.1 07/28/2016   HCT 41.4 07/28/2016   PLT 168 07/28/2016   GLUCOSE 133 (H) 07/27/2016   CHOL 115 09/19/2015   TRIG 137.0 09/19/2015   HDL 37.50 (L) 09/19/2015   LDLCALC 50 09/19/2015   ALT 17 07/27/2016   AST 29 07/27/2016   NA 134 (L) 07/27/2016   K 4.7 07/27/2016   CL 100 (L) 07/27/2016   CREATININE 0.99 07/28/2016   BUN 14 07/27/2016   CO2 26 07/27/2016   TSH 2.014 07/28/2016   INR 0.9 ratio 11/14/2009   HGBA1C  01/02/2008    6.1 (NOTE)   The ADA recommends the following therapeutic goal for glycemic   control related to Hgb A1C measurement:   Goal of Therapy:   < 7.0% Hgb A1C   Reference: American Diabetes Association: Clinical Practice   Recommendations 2008, Diabetes Care,  2008, 31:(Suppl 1).    Dg Chest 2 View  Result Date: 07/27/2016 CLINICAL DATA:  Initial evaluation for acute chest pain. EXAM: CHEST  2 VIEW COMPARISON:  Prior radiograph from 04/10/2016. FINDINGS: Left-sided pacemaker/ AICD in place, stable. Stable cardiomegaly. Mediastinal silhouette normal. Aortic atherosclerosis.  Moderate to large hiatal hernia. Elevation of the right hemidiaphragm, similar to prior, likely chronic. Associated right basilar atelectasis. Scattered linear density at the left lung base also consistent with atelectasis. No focal infiltrates. No pulmonary edema or pleural effusion. No pneumothorax. No acute osseus abnormality. IMPRESSION: 1. Chronic elevation of the right hemidiaphragm and moderate hiatal hernia with associated bibasilar atelectasis, similar to previous. 2. No other active cardiopulmonary disease. 3. Stable cardiomegaly without pulmonary edema. 4. Aortic atherosclerosis. Electronically Signed   By: Rise Mu M.D.   On: 07/27/2016 22:05   Dg Ribs Bilateral  Result Date: 07/28/2016 CLINICAL DATA:  Bilateral chest wall pain. EXAM: BILATERAL RIBS - 3+ VIEW COMPARISON:  None. FINDINGS: No evidence of rib fracture or focal lesion. IMPRESSION: Negative. Electronically Signed   By: Paulina Fusi M.D.   On: 07/28/2016 15:26   Dg Thoracic Spine 2 View  Result Date: 07/28/2016 CLINICAL DATA:  Chronic thoracic back pain. EXAM: THORACIC SPINE 2 VIEWS COMPARISON:  07/27/2016 FINDINGS: Mild thoracic curvature. No evidence of thoracic fracture or focal bone lesion. Extensive lumbar curvature with lower lumbar degenerative changes, not primarily or completely evaluated. IMPRESSION: Minimal thoracic curvature. No fracture or significant focal finding. Electronically Signed   By: Paulina Fusi M.D.   On: 07/28/2016 15:24   Dg Lumbar Spine 2-3 Views  Result Date: 07/28/2016 CLINICAL DATA:  Low back pain. EXAM: LUMBAR SPINE - 2-3 VIEW COMPARISON:  CT of the abdomen and pelvis on 04/09/2016 FINDINGS: Severe leftward convex scoliosis of the lumbar spine noted with associated multilevel degenerative disc disease. No clear acute fracture. No bony lesions or destruction. Aortoiliac atherosclerosis with extensive calcified plaque. IMPRESSION: 1. Severe degenerative leftward convex scoliosis of the  lumbar spine with advanced multilevel degenerative disc disease. 2. Atherosclerosis of the abdominal aorta and iliac arteries. Electronically Signed   By: Irish Lack M.D.   On: 07/28/2016 15:05   Dg Hip Unilat With Pelvis 2-3 Views Right  Result Date: 07/28/2016 CLINICAL DATA:  Right lower back pain radiating to the right groin for 1 week. EXAM: DG HIP (WITH OR WITHOUT PELVIS) 2-3V RIGHT COMPARISON:  None. FINDINGS: No evidence of hip fracture or malalignment. No degenerative hip narrowing or spurring. Pelvic ring  appears to be intact, limited over the sacrum and iliac wings due to bowel gas. Osteopenia and atherosclerosis. Scoliosis. IMPRESSION: No acute finding or degenerative hip narrowing. Electronically Signed   By: Marnee SpringJonathon  Watts M.D.   On: 07/28/2016 09:13     Assessment & Plan:  Plan  I am having Ms. Senat maintain her Psyllium, aspirin, NON FORMULARY, beclomethasone, albuterol, pravastatin, diphenhydrAMINE, metoprolol succinate, Liniments (SALONPAS PAIN RELIEF PATCH EX), nitroGLYCERIN, isosorbide mononitrate, losartan, levothyroxine, furosemide, traMADol, amoxicillin, and ondansetron.  No orders of the defined types were placed in this encounter.   Problem List Items Addressed This Visit      Unprioritized   Congestive heart failure (HCC) - Primary    F/u cardiology      Relevant Orders   Ambulatory referral to Cardiology   Basic metabolic panel   CBC with Differential/Platelet   Kidney stone    F/u urology      Relevant Orders   POCT Urinalysis Dipstick (Automated) (Completed)   UTI'S, RECURRENT    Recheck urine today         Follow-up: Return in about 3 months (around 11/06/2016), or if symptoms worsen or fail to improve.  Donato SchultzYvonne R Lowne Chase, DO

## 2016-08-06 NOTE — Assessment & Plan Note (Signed)
F/u urology.  

## 2016-08-06 NOTE — Patient Outreach (Signed)
[facility] Care Management  08/06/2016  Tracy MontanaDorothy G [name] Dec 11, 1921 [id number]  EMMI-Heart Failure-Red Alert Day #5 on 08/05/2016 Reason: New/worsening problem-yes  Call # 1 to patient who was advised of reason for call. HIPPA verification received.  Patient voices that she received call yesterday & answered the question wrong & was unable to correct her answer. States she did not have any problem or worsening condition yesterday.   States she had follow up visit with primary care provider today. Voices that she was advised by MD that she had no wheezing. States she has been weighing daily and has no weight gain. States no swelling noted in ankles.   Patient states she is aware that she needs to call MD office if she has weight gain or other signs of HF. States she will call 911 in emergency situation such as chest pain or unable to breathe.  No health concerns today. EMMI call addressed.  Plan: Send to care management assistant to close case.  Colleen CanLinda Juley Giovanetti, RN BSN CCM Care Management Coordinator [facility] Care Management  [phone]

## 2016-08-06 NOTE — Patient Instructions (Signed)
Kidney Stones  Kidney stones (urolithiasis) are solid, rock-like deposits that form inside of the organs that make urine (kidneys). A kidney stone may form in a kidney and move into the bladder, where it can cause intense pain and block the flow of urine. Kidney stones are created when high levels of certain minerals are found in the urine. They are usually passed through urination, but in some cases, medical treatment may be needed to remove them.  What are the causes?  Kidney stones may be caused by:  · A condition in which certain glands produce too much parathyroid hormone (primary hyperparathyroidism), which causes too much calcium buildup in the blood.  · Buildup of uric acid crystals in the bladder (hyperuricosuria). Uric acid is a chemical that the body produces when you eat certain foods. It usually exits the body in the urine.  · Narrowing (stricture) of one or both of the tubes that drain urine from the kidneys to the bladder (ureters).  · A kidney blockage that is present at birth (congenital obstruction).  · Past surgery on the kidney or the ureters, such as gastric bypass surgery.    What increases the risk?  The following factors make you more likely to develop kidney stones:  · Having had a kidney stone in the past.  · Having a family history of kidney stones.  · Not drinking enough water.  · Eating a diet that is high in protein, salt (sodium), or sugar.  · Being overweight or obese.    What are the signs or symptoms?  Symptoms of a kidney stone may include:  · Nausea.  · Vomiting.  · Blood in the urine (hematuria).  · Pain in the side of the abdomen, right below the ribs (flank pain). Pain usually spreads (radiates) to the groin.  · Needing to urinate frequently or urgently.    How is this diagnosed?  This condition may be diagnosed based on:  · Your medical history.  · A physical exam.  · Blood tests.  · Urine tests.  · CT scan.  · Abdominal X-ray.  · A procedure to examine the inside of the  bladder (cystoscopy).    How is this treated?  Treatment for kidney stones depends on the size, location, and makeup of the stones. Treatment may involve:  · Analyzing your urine before and after you pass the stone through urination.  · Being monitored at the hospital until you pass the stone through urination.  · Increasing your fluid intake and decreasing the amount of calcium and protein in your diet.  · A procedure to break up kidney stones in the bladder using:  ? A focused beam of light (laser therapy).  ? Shock waves (extracorporeal shock wave lithotripsy).  · Surgery to remove kidney stones. This may be needed if you have severe pain or have stones that block your urinary tract.    Follow these instructions at home:  Eating and drinking     · Drink enough fluid to keep your urine clear or pale yellow. This will help you to pass the kidney stone.  · If directed, change your diet. This may include:  ? Limiting how much sodium you eat.  ? Eating more fruits and vegetables.  ? Limiting how much meat, poultry, fish, and eggs you eat.  · Follow instructions from your health care provider about eating or drinking restrictions.  General instructions   · Collect urine samples as told by your health care   provider. You may need to collect a urine sample:  ? 24 hours after you pass the stone.  ? 8-12 weeks after passing the kidney stone, and every 6-12 months after that.  · Strain your urine every time you urinate, for as long as directed. Use the strainer that your health care provider recommends.  · Do not throw out the kidney stone after passing it. Keep the stone so it can be tested by your health care provider. Testing the makeup of your kidney stone may help prevent you from getting kidney stones in the future.  · Take over-the-counter and prescription medicines only as told by your health care provider.  · Keep all follow-up visits as told by your health care provider. This is important. You may need follow-up  X-rays or ultrasounds to make sure that your stone has passed.  How is this prevented?  To prevent another kidney stone:  · Drink enough fluid to keep your urine clear or pale yellow. This is the best way to prevent kidney stones.  · Eat a healthy diet and follow recommendations from your health care provider about foods to avoid. You may be instructed to eat a low-protein diet. Recommendations vary depending on the type of kidney stone that you have.  · Maintain a healthy weight.    Contact a health care provider if:  · You have pain that gets worse or does not get better with medicine.  Get help right away if:  · You have a fever or chills.  · You develop severe pain.  · You develop new abdominal pain.  · You faint.  · You are unable to urinate.  This information is not intended to replace advice given to you by your health care provider. Make sure you discuss any questions you have with your health care provider.  Document Released: 12/25/2004 Document Revised: 07/15/2015 Document Reviewed: 06/10/2015  Elsevier Interactive Patient Education © 2017 [employer]

## 2016-08-06 NOTE — Assessment & Plan Note (Signed)
F/u cardiology 

## 2016-08-06 NOTE — Assessment & Plan Note (Addendum)
Recheck urine today  

## 2016-08-06 NOTE — Progress Notes (Signed)
Pre visit review using our clinic review tool, if applicable. No additional management support is needed unless otherwise documented below in the visit note. 

## 2016-08-13 ENCOUNTER — Inpatient Hospital Stay: Payer: Self-pay | Admitting: Family Medicine

## 2016-08-13 NOTE — Progress Notes (Signed)
Cardiology Office Note:    Date:  08/14/2016   ID:  ROYALTY [name], DOB 1921/03/21, MRN [id number]  PCP:  Tracy Schultz, DO  Cardiologist:  Dr. Graciela [name]  Referring MD: [name], Grayling Congress, *   Chief Complaint  Patient presents with  . Hospitalization Follow-up    CHF    History of Present Illness:    [name] is a 83 y.o. female with a past medical history significant for CAD with PCI, ICM/CHF with CRT-D (gen changeout 11/2015), HTN, HLD, hypothyroid, GERD,  The patient was recently hospitalized to [facility] 07/27/16-07/30/16 for weakness and chest tightness. Her BNP was mildly elevated at 603.4. Troponins were negative. An echocardiogram showing EF 45-50%. She was found to have a UTI and right-sided hydronephrosis the possibly represented a recently passed kidney stone. She was given a dose of IV Lasix and discharged home on Lasix 20 mg as needed.  Last CRT-P remote check on 07/05/16 showed Normal remote reviewed. 15 AMS episodes (<1%) 1 VHR episode - EGM shows AF with RVR. Total BiVP 82%  Today the patient comes in with her granddaughter in law. Since her hospital stay the patient has done well. She notes that she was wheezing prior to the hospital but has had no further wheezing since then. She had been taking her Lasix only as needed which was rarely, however now she is taking her Lasix approximately 2 times per week and is tolerating this well. Her home weights are stable. She denies dyspnea on exertion, chest pain, lightheadedness, edema. She does her usual light housework without problems. Her granddaughter in law notes that the patient seems to have had decreasing energy levels over about the last 6 months. The patient states this is because her family won't let her do anything.  Past Medical History:  Diagnosis Date  . Anxiety    sleep  . Arthritis   . Automatic implantable cardioverter/defibrillator (AICD) activation    s/p implantable  cardioverter-defibrillator bivenrticular pacer. 12/05. St, Jude Medtronic. remote-no.  [name] CAD (coronary artery disease)    a. s/p prior anterior wall myocardial infarction and multiple percutaneous coronary intervention.;  b. [place] MV 1/14:  low risk, inf and apical defect likely due to atten artifact, no ischemia, EF 35% (visually normal and est 55-60%)  . Chest pain 12/09   recent hospitalization for it. felt to be noncardiac.   [name] CHF (congestive heart failure) (HCC)   . DD (diverticular disease)    Hx of it - severe. Left colon 2004  . Esophageal stricture    last dialated 2007  . GERD (gastroesophageal reflux disease)   . Hiatal hernia   . HTN (hypertension)   . Hyperlipidemia   . Hypothyroidism   . Ischemic cardiomyopathy    EF 25-30% improved to 45% with biventricular pacing.   [name] PVT (paroxysmal ventricular tachycardia) (HCC)   . Sleep apnea    has a Cpap machine  . Systolic heart failure    improved to class I to II   . UTI (lower urinary tract infection)    recurrent. (Klebsiella pneumoniae). Last Cx 02/03/09. (R-ancef, nitrofurantoin, Augmentin, Zyosyn)    Past Surgical History:  Procedure Laterality Date  . ANGIOPLASTY     stent  . APPENDECTOMY    . CARDIAC CATHETERIZATION     stent  . CARDIAC DEFIBRILLATOR PLACEMENT     St Jude Atlas  . CATARACT EXTRACTION, BILATERAL    . COLONOSCOPY W/ POLYPECTOMY    .  EP IMPLANTABLE DEVICE N/A 12/05/2015   Procedure: ICD Generator Changeout;  Surgeon: Duke Salvia, MD;  Location: [facility] INVASIVE CV LAB;  Service: Cardiovascular;  Laterality: N/A;  . HAMMER TOE SURGERY Left 01/15/2013   Procedure: LEFT SECOND THROUGH FOURTH HAMMERTOE CORRECTION ;  Surgeon: Toni Arthurs, MD;  Location: [facility] OR;  Service: Orthopedics;  Laterality: Left;  . HAMMERTOE RECONSTRUCTION WITH WEIL OSTEOTOMY Right 07/17/2012   Procedure: RIGHT SECOND AND THIRD MT WEIL OSTEOTOMIES AND SECOND AND THIRD HAMMERTOE CORRECTIONS;  Surgeon: Toni Arthurs, MD;  Location: [facility] OR;   Service: Orthopedics;  Laterality: Right;  . INSERT / REPLACE / REMOVE PACEMAKER    . TONSILLECTOMY    . TOTAL KNEE ARTHROPLASTY     bilateral  . TUBAL LIGATION    . unspecified area hysterectomy    . WEIL OSTEOTOMY Left 01/15/2013   Procedure:  WEIL OSTEOTOMY AND DORSAL CAPSULOTOMY;  Surgeon: Toni Arthurs, MD;  Location: [facility] OR;  Service: Orthopedics;  Laterality: Left;    Current Medications: Current Meds  Medication Sig  . albuterol (PROVENTIL HFA;VENTOLIN HFA) 108 (90 Base) MCG/ACT inhaler Inhale 2 puffs into the lungs every 6 (six) hours as needed for wheezing or shortness of breath.  [name] aspirin (ASPIR-81) 81 MG EC tablet Take 81 mg by mouth daily.    . beclomethasone (QVAR) 40 MCG/ACT inhaler Inhale 2 puffs into the lungs 2 (two) times daily.  . diphenhydrAMINE (BENADRYL) 25 mg capsule Take 25 mg by mouth every 6 (six) hours as needed for itching or allergies.  . furosemide (LASIX) 20 MG tablet Take 1 tablet (20 mg total) by mouth daily as needed for fluid or edema.  . isosorbide mononitrate (IMDUR) 30 MG 24 hr tablet TAKE ONE & ONE-HALF TABLETS BY MOUTH ONCE DAILY IN THE MORNING  . levothyroxine (SYNTHROID, LEVOTHROID) 100 MCG tablet TAKE 1 TABLET BY MOUTH ONCE DAILY BEFORE BREAKFAST  . Liniments (SALONPAS PAIN RELIEF PATCH EX) Apply 1 patch topically daily as needed (pain).   [name] losartan (COZAAR) 50 MG tablet TAKE 1 TABLET BY MOUTH ONCE DAILY (YOU NEED APPOINTMENT FOR MORE REFILLS)  . metoprolol succinate (TOPROL-XL) 50 MG 24 hr tablet TAKE ONE TABLET BY MOUTH TWICE DAILY WITH  OR  IMMEDIATELY  FOLLOWING  A  MEAL  . nitroGLYCERIN (NITROSTAT) 0.4 MG SL tablet Place 1 tablet (0.4 mg total) under the tongue every 5 (five) minutes as needed. At the onset of chest pain, Up to 3 doses  . NON FORMULARY C-Pap machine, uses nightly for sleep apnea  . ondansetron (ZOFRAN) 4 MG tablet Take 1 tablet (4 mg total) by mouth every 8 (eight) hours as needed for nausea or vomiting.  . pravastatin  (PRAVACHOL) 20 MG tablet TAKE ONE TABLET BY MOUTH ONCE DAILY  . Psyllium (METAFIBER) 48.57 % POWD Take 10 mLs by mouth every evening. Mixed with water  . traMADol (ULTRAM) 50 MG tablet Take 1 tablet (50 mg total) by mouth every 12 (twelve) hours as needed for severe pain.     Allergies:   Sulfonamide derivatives   Social History   Social History  . Marital status: Widowed    Spouse name: N/A  . Number of children: 4  . Years of education: N/A   Occupational History  . Retired    Social History Main Topics  . Smoking status: Never Smoker  . Smokeless tobacco: Never Used  . Alcohol use No  . Drug use: No  . Sexual activity: Not Asked   Other  Topics Concern  . None   Social History Narrative   Widowed, husband died of brain tumor. Son lives in [place], [place]- was in a MVA for DWI. He has had a long hx of alcohol problems, she has helped him out financially in the past but is ready to be much stricter. Pt. Has a daughter who live in [place] with Breast Ca. She also lost a son to a brain tumor.      Family History: The patient's family history includes Alcohol abuse in her son; Brain cancer in her son; Breast cancer in her daughter; Colon polyps in her sister; Diabetes in her daughter and unknown relative; Heart disease in her brother, father, mother, and sister; Lung cancer in her son; Prostate cancer in her brother. There is no history of Colon cancer. ROS:   Please see the history of present illness.     All other systems reviewed and are negative.  EKGs/Labs/Other Studies Reviewed:    The following studies were reviewed today:  Echocardiogram on 07/29/16: EF 45-50%, moderate MR, severely dilated left atrium  Last LHC 10/05: RCA stent site patent, PL branch of the RCA 50%, dCFX 80% treated with Taxus DES  Nuclear study a 12/10: No scar or ischemia, EF 54%.  Repeat nuclear study 1/14 because of chest pain demonstrated no significant ischemia. Ejection fraction is measured at 35%  although visualization thought to be about 55-60% with normal wall motion   EKG:  EKG is not ordered today.   Recent Labs: 07/27/2016: ALT 17; B Natriuretic Peptide 603.4 07/28/2016: Magnesium 2.0; TSH 2.014 08/06/2016: BUN 22; Creatinine, Ser 0.91; Hemoglobin 13.7; Platelets 239.0; Potassium 5.0; Sodium 134   Recent Lipid Panel    Component Value Date/Time   CHOL 115 09/19/2015 1644   TRIG 137.0 09/19/2015 1644   HDL 37.50 (L) 09/19/2015 1644   CHOLHDL 3 09/19/2015 1644   VLDL 27.4 09/19/2015 1644   LDLCALC 50 09/19/2015 1644    Physical Exam:    VS:  BP (!) 102/42   Pulse 88   Ht 4\' 11"  (1.499 m)   Wt 126 lb (57.2 kg)   SpO2 96%   BMI 25.45 kg/m     Wt Readings from Last 3 Encounters:  08/14/16 126 lb (57.2 kg)  08/06/16 127 lb 4 oz (57.7 kg)  07/30/16 126 lb 4.8 oz (57.3 kg)     Physical Exam  Constitutional: She is oriented to person, place, and time. She appears well-developed and well-nourished. No distress.  Petite, elderly female  HENT:  Head: Normocephalic and atraumatic.  Neck: Normal range of motion. Neck supple. No JVD present. Carotid bruit is not present.  Cardiovascular: Normal rate, regular rhythm and normal heart sounds.  Exam reveals no gallop and no friction rub.   No murmur heard. Pulmonary/Chest: Effort normal and breath sounds normal. No respiratory distress. She has no wheezes. She has no rales.  Abdominal: Soft. There is no tenderness.  Musculoskeletal: Normal range of motion. She exhibits no edema.  Neurological: She is alert and oriented to person, place, and time.  Skin: Skin is warm and dry.  Psychiatric: She has a normal mood and affect. Her behavior is normal.    ASSESSMENT:    1. Chronic systolic heart failure (HCC)   2. Coronary artery disease involving native coronary artery of native heart without angina pectoris   3. Essential (primary) hypertension   4. Biventricular cardiac pacemaker in situ    PLAN:    In order of  problems listed above:  1. Chronic systolic heart failure/ICM -Recent hospitalization on 07/27/16 for weakness and chest tightness with slightly elevated BNP. Echocardiogram showed an EF 45-50%. Troponins were negative. She was found to have hydronephrosis with possible recent passage of kidney stone. -Patient is doing well now with stable weight, no DOE, chest pain, orthopnea, lightheadedness, or edema. She appears euvolemic. -She was previously taking Lasix only rarely as needed and is now taking it 2 times per week and doing well -Continue beta blocker, ace-I and Lasix  2. CAD -Patient has a history of multiple PCI. She is currently stable without any chest pain or activity intolerance. -Continue aspirin, beta blocker, statin. Will decrease Imdur insetting of lower blood pressure.  3. hypertension -Blood pressure is well controlled, slightly low today at 102/42. -The patient has no symptoms of hypotension, however, her granddaughter in law is concerned about her blood pressure getting too low. After discussion with the patient and her granddaughter they will decrease her isosorbide to 1 tablet daily and assess how she tolerates this. -Continue beta blocker and ACE inhibitor. Can consider decreasing her losartan if her blood pressure continues to run low and/or the patient becomes symptomatic.  4. CRT-P (St Jude) -Generator change in 11/2015 -Followed by Dr. Graciela HusbandsKlein   Medication Adjustments/Labs and Tests Ordered: Current medicines are reviewed at length with the patient today.  Concerns regarding medicines are outlined above. Labs and tests ordered and medication changes are outlined in the patient instructions below:  Patient Instructions  Medication Instructions:  1. Your physician recommends that you continue on your current medications as directed. Please refer to the Current Medication list given to you today.  2. HOWEVER IF YOU FEEL LIKE YOUR BLOOD PRESSURE IS TOO LOW YOU CAN TRY  TAKING 1 TABLET OF THE ISOSORBIDE THAT DAY FOR YOUR LOW BLOOD PRESSURE.   3. ALSO IF YOU DO HAVE CHEST PAIN/TIGHTNESS YOU WILL NEED TO MAKE SURE TO INCREASE THE ISOSORBIDE BACK UP TO 1 AND 1/2 TABLET   Labwork: NONE ORDERED   Testing/Procedures: NONE ORDERED   Follow-Up: 11/23/16 @ 10:30 WITH DR. Graciela HusbandsKLEIN   Any Other Special Instructions Will Be Listed Below (If Applicable).     If you need a refill on your cardiac medications before your next appointment, please call your pharmacy.      Signed, Berton BonJanine Lorn Butcher, NP  08/14/2016 3:05 PM    [facility]

## 2016-08-14 ENCOUNTER — Ambulatory Visit (INDEPENDENT_AMBULATORY_CARE_PROVIDER_SITE_OTHER): Payer: Medicare Other | Admitting: Cardiology

## 2016-08-14 ENCOUNTER — Encounter: Payer: Self-pay | Admitting: Physician Assistant

## 2016-08-14 VITALS — BP 102/42 | HR 88 | Ht 59.0 in | Wt 126.0 lb

## 2016-08-14 DIAGNOSIS — I5022 Chronic systolic (congestive) heart failure: Secondary | ICD-10-CM | POA: Diagnosis not present

## 2016-08-14 DIAGNOSIS — Z950 Presence of cardiac pacemaker: Secondary | ICD-10-CM

## 2016-08-14 DIAGNOSIS — I2510 Atherosclerotic heart disease of native coronary artery without angina pectoris: Secondary | ICD-10-CM | POA: Diagnosis not present

## 2016-08-14 DIAGNOSIS — I10 Essential (primary) hypertension: Secondary | ICD-10-CM

## 2016-08-14 DIAGNOSIS — I255 Ischemic cardiomyopathy: Secondary | ICD-10-CM

## 2016-08-14 NOTE — Patient Instructions (Addendum)
Medication Instructions:  1. Your physician recommends that you continue on your current medications as directed. Please refer to the Current Medication list given to you today.  2. HOWEVER IF YOU FEEL LIKE YOUR BLOOD PRESSURE IS TOO LOW YOU CAN TRY TAKING 1 TABLET OF THE ISOSORBIDE THAT DAY FOR YOUR LOW BLOOD PRESSURE.   3. ALSO IF YOU DO HAVE CHEST PAIN/TIGHTNESS YOU WILL NEED TO MAKE SURE TO INCREASE THE ISOSORBIDE BACK UP TO 1 AND 1/2 TABLET   Labwork: NONE ORDERED   Testing/Procedures: NONE ORDERED   Follow-Up: 11/23/16 @ 10:30 WITH DR. Graciela HusbandsKLEIN   Any Other Special Instructions Will Be Listed Below (If Applicable).     If you need a refill on your cardiac medications before your next appointment, please call your pharmacy.

## 2016-08-20 ENCOUNTER — Other Ambulatory Visit: Payer: Self-pay | Admitting: *Deleted

## 2016-08-20 ENCOUNTER — Telehealth: Payer: Self-pay | Admitting: Family Medicine

## 2016-08-20 NOTE — Telephone Encounter (Signed)
Prolia benefits verified No PA required Deductible met 20% co-insurance covered by secondary   Patient may owe approximately $0 OOP  Due after 09/11/16

## 2016-08-20 NOTE — Patient Outreach (Signed)
[facility] Care Management  08/20/2016  Laurann MontanaDorothy G Royster February 16, 1921 [id number]  EMMI-Heart Failure Red Alert referral Day #17, 08/17/2016: Reason: Weighed themselves today-no:  Telephone call #1 to patient.  Patient voices that she has been weighing daily & that weight has been consistent at 120 pounds. States she is aware of fluid weight gain symptoms and will advise her doctor if she gains 2-3 pounds in 24 hr period or 5 pounds in 1 week.   Voices she attended cardiology follow up appointment 8/7. Patient taking medications as prescribed & understands importance of taking as prescribed. Patient voices no current health concern. EMMI red flag has been addressed.  Plan: Send to care management assistant to close case. Colleen CanLinda Shannel Zahm, RN BSN CCM Care Management Coordinator [facility] Care [facility]  [phone]

## 2016-08-23 ENCOUNTER — Other Ambulatory Visit: Payer: Self-pay | Admitting: Family Medicine

## 2016-08-29 NOTE — Telephone Encounter (Signed)
Spoke to pt, pt give permission for granddaughter to speak on her behalf. Spoke with pt's granddaughter about pt's receiving prollia injections and that her prolia benefits were verified. Patient state she would like to receive the prolia injections. Patient nor patient's granddaughter had no questions or concerns at this time. LB

## 2016-08-29 NOTE — Telephone Encounter (Signed)
Prolia has been ordered

## 2016-09-03 NOTE — Telephone Encounter (Signed)
Patient is scheduled to receive prolia injection on 09/12/16. LB

## 2016-09-03 NOTE — Telephone Encounter (Signed)
Prolia received and in fridge for pt. 

## 2016-09-04 ENCOUNTER — Other Ambulatory Visit: Payer: Self-pay | Admitting: *Deleted

## 2016-09-04 NOTE — Patient Outreach (Signed)
[facility]) Care Management  09/04/2016  BRENTON ARBAIZA November 01, 1921 [id number]  EMMI-Heart failure-Red Alert-referral Day #43, 09/03/2016; Reason: New/worsening problem-yes.  Call #1 to patient; HIPPA verification received from patient.  Patient states she does not have any new problems or worsening condition. States she answered "no" to question. States she is doing well & just spent some time at the beach.   Voices that she continues to weigh herself daily & has not had any weight gain. States taking medications as prescribed & attends MD appointments. Voices she has assistance from family as needed.   States no health concerns at this time. EMMI call completed.  Plan: Send to care management assistant to close out.   Colleen Can, RN BSN CCM Care Management Coordinator [facility] Care Management  [phone]

## 2016-09-11 ENCOUNTER — Ambulatory Visit (INDEPENDENT_AMBULATORY_CARE_PROVIDER_SITE_OTHER): Payer: Medicare Other

## 2016-09-11 DIAGNOSIS — M810 Age-related osteoporosis without current pathological fracture: Secondary | ICD-10-CM | POA: Diagnosis not present

## 2016-09-11 MED ORDER — DENOSUMAB 60 MG/ML ~~LOC~~ SOLN
60.0000 mg | Freq: Once | SUBCUTANEOUS | Status: AC
  Administered 2016-09-11: 60 mg via SUBCUTANEOUS

## 2016-09-11 NOTE — Progress Notes (Signed)
Pre visit review using our clinic tool,if applicable. No additional management support is needed unless otherwise documented below in the visit note.   Patient in for Prolia Injection per order from Dr. Loreen FreudYvonne Lowne dates 08/29/16.  Patient given 60 ml SQ right abdominal area. Patient tolerated well.  No complaints voiced this visit.  Appointment reminder given for next in injection timeframe which is 03/2017

## 2016-09-11 NOTE — Progress Notes (Signed)
Yvonne R Lowne Chase, DO 

## 2016-09-12 ENCOUNTER — Ambulatory Visit: Payer: Self-pay

## 2016-09-14 ENCOUNTER — Other Ambulatory Visit: Payer: Self-pay | Admitting: Family Medicine

## 2016-09-17 ENCOUNTER — Other Ambulatory Visit: Payer: Self-pay | Admitting: Family Medicine

## 2016-09-24 ENCOUNTER — Other Ambulatory Visit: Payer: Self-pay | Admitting: Family Medicine

## 2016-09-25 ENCOUNTER — Telehealth: Payer: Self-pay | Admitting: Family Medicine

## 2016-09-25 DIAGNOSIS — R8279 Other abnormal findings on microbiological examination of urine: Secondary | ICD-10-CM | POA: Diagnosis not present

## 2016-09-25 DIAGNOSIS — N130 Hydronephrosis with ureteropelvic junction obstruction: Secondary | ICD-10-CM | POA: Diagnosis not present

## 2016-09-25 NOTE — Telephone Encounter (Signed)
That is fine 

## 2016-09-25 NOTE — Telephone Encounter (Signed)
Grandaughter (caretaker) [name], calling to switch PCP from Dr. Florina Ou of [facility] to Dr. Helane Rima of [facility].  Please respond at your earliest convenience to acjnowledge the patient's request.  Ty,  -LL

## 2016-09-26 ENCOUNTER — Ambulatory Visit: Payer: Self-pay

## 2016-09-26 ENCOUNTER — Ambulatory Visit (INDEPENDENT_AMBULATORY_CARE_PROVIDER_SITE_OTHER): Payer: Medicare Other | Admitting: *Deleted

## 2016-09-26 DIAGNOSIS — Z23 Encounter for immunization: Secondary | ICD-10-CM | POA: Diagnosis not present

## 2016-09-26 NOTE — Telephone Encounter (Signed)
Okay 

## 2016-09-27 ENCOUNTER — Other Ambulatory Visit: Payer: Self-pay | Admitting: Family Medicine

## 2016-10-04 ENCOUNTER — Ambulatory Visit (INDEPENDENT_AMBULATORY_CARE_PROVIDER_SITE_OTHER): Payer: Medicare Other | Admitting: *Deleted

## 2016-10-04 DIAGNOSIS — I255 Ischemic cardiomyopathy: Secondary | ICD-10-CM | POA: Diagnosis not present

## 2016-10-04 NOTE — Progress Notes (Signed)
Remote pacemaker transmission.   

## 2016-10-08 LAB — CUP PACEART REMOTE DEVICE CHECK
Battery Remaining Percentage: 95.5 %
Brady Statistic AP VP Percent: 1.8 %
Brady Statistic AS VS Percent: 8.3 %
Brady Statistic RA Percent Paced: 1 %
Implantable Lead Implant Date: 20051206
Implantable Lead Implant Date: 20051206
Implantable Lead Location: 753860
Lead Channel Impedance Value: 400 Ohm
Lead Channel Impedance Value: 640 Ohm
Lead Channel Pacing Threshold Amplitude: 1.25 V
Lead Channel Pacing Threshold Amplitude: 2.5 V
Lead Channel Pacing Threshold Pulse Width: 0.4 ms
Lead Channel Sensing Intrinsic Amplitude: 1.5 mV
Lead Channel Sensing Intrinsic Amplitude: 6 mV
Lead Channel Setting Pacing Amplitude: 2.5 V
Lead Channel Setting Sensing Sensitivity: 1.5 mV
MDC IDC LEAD IMPLANT DT: 20051206
MDC IDC LEAD LOCATION: 753858
MDC IDC LEAD LOCATION: 753859
MDC IDC MSMT BATTERY REMAINING LONGEVITY: 67 mo
MDC IDC MSMT BATTERY VOLTAGE: 2.96 V
MDC IDC MSMT LEADCHNL LV PACING THRESHOLD PULSEWIDTH: 1.2 ms
MDC IDC MSMT LEADCHNL RA IMPEDANCE VALUE: 390 Ohm
MDC IDC MSMT LEADCHNL RA PACING THRESHOLD AMPLITUDE: 0.75 V
MDC IDC MSMT LEADCHNL RV PACING THRESHOLD PULSEWIDTH: 0.4 ms
MDC IDC PG IMPLANT DT: 20171127
MDC IDC PG SERIAL: 7855837
MDC IDC SESS DTM: 20180927060016
MDC IDC SET LEADCHNL LV PACING PULSEWIDTH: 1.2 ms
MDC IDC SET LEADCHNL RA PACING AMPLITUDE: 2 V
MDC IDC SET LEADCHNL RV PACING AMPLITUDE: 2.5 V
MDC IDC SET LEADCHNL RV PACING PULSEWIDTH: 1 ms
MDC IDC STAT BRADY AP VS PERCENT: 1 %
MDC IDC STAT BRADY AS VP PERCENT: 82 %

## 2016-10-09 ENCOUNTER — Encounter: Payer: Self-pay | Admitting: Cardiology

## 2016-10-11 ENCOUNTER — Ambulatory Visit: Payer: Self-pay | Admitting: Family Medicine

## 2016-10-12 ENCOUNTER — Encounter: Payer: Self-pay | Admitting: Family Medicine

## 2016-10-12 ENCOUNTER — Ambulatory Visit (INDEPENDENT_AMBULATORY_CARE_PROVIDER_SITE_OTHER): Payer: Medicare Other | Admitting: Family Medicine

## 2016-10-12 VITALS — BP 116/66 | HR 76 | Temp 97.9°F | Ht <= 58 in | Wt 122.5 lb

## 2016-10-12 DIAGNOSIS — I255 Ischemic cardiomyopathy: Secondary | ICD-10-CM | POA: Diagnosis not present

## 2016-10-12 DIAGNOSIS — E782 Mixed hyperlipidemia: Secondary | ICD-10-CM | POA: Diagnosis not present

## 2016-10-12 DIAGNOSIS — I10 Essential (primary) hypertension: Secondary | ICD-10-CM

## 2016-10-12 DIAGNOSIS — G4733 Obstructive sleep apnea (adult) (pediatric): Secondary | ICD-10-CM

## 2016-10-12 DIAGNOSIS — I5022 Chronic systolic (congestive) heart failure: Secondary | ICD-10-CM

## 2016-10-12 NOTE — Progress Notes (Signed)
[name] is a 83 y.o. female is here to [facility].   Patient Care Team: Helane Rima, DO as PCP - General (Family Medicine) Tat, Octaviano Batty, DO as Consulting Physician (Neurology) Hart Carwin, MD (Inactive) as Consulting Physician (Gastroenterology) Duke Salvia, MD as Consulting Physician (Cardiology) Lenda Kelp, MD as Consulting Physician (Sports Medicine)   History of Present Illness:   HPI: New patient. See Assessment and Plan section for Problem Based Charting of issues discussed today.  There are no preventive care reminders to display for this patient.   Depression screen PHQ 2/9 10/12/2016  Decreased Interest 0  Down, Depressed, Hopeless 0  PHQ - 2 Score 0  Some recent data might be hidden   PMHx, SurgHx, SocialHx, Medications, and Allergies were reviewed in the Visit Navigator and updated as appropriate.   Past Medical History:  Diagnosis Date  . Anxiety   . Arthritis   . Automatic implantable cardioverter/defibrillator (AICD) activation    s/p implantable cardioverter-defibrillator bivenrticular pacer. 12/05. St, Jude Medtronic. remote-no.  [name] CAD (coronary artery disease)    a. s/p prior anterior wall myocardial infarction and multiple percutaneous coronary intervention.;  b. Aden MV 1/14:  low risk, inf and apical defect likely due to atten artifact, no ischemia, EF 35% (visually normal and est 55-60%)  . Chest pain 12/09   recent hospitalization for it. felt to be noncardiac.   [name] CHF (congestive heart failure) (HCC)   . DD (diverticular disease)    Hx of it - severe. Left colon 2004  . Esophageal stricture    last dialated 2007  . GERD (gastroesophageal reflux disease)   . Hiatal hernia   . HTN (hypertension)   . Hyperlipidemia   . Hypothyroidism   . Ischemic cardiomyopathy    EF 25-30% improved to 45% with biventricular pacing.   [name] PVT (paroxysmal ventricular tachycardia) (HCC)   . Sleep apnea    has a Cpap machine  . Systolic  heart failure    improved to class I to II   . UTI (lower urinary tract infection)    recurrent. (Klebsiella pneumoniae). Last Cx 02/03/09. (R-ancef, nitrofurantoin, Augmentin, Zyosyn)   Past Surgical History:  Procedure Laterality Date  . ANGIOPLASTY     stent  . APPENDECTOMY    . CARDIAC CATHETERIZATION     stent  . CARDIAC DEFIBRILLATOR PLACEMENT     St Jude Atlas  . CATARACT EXTRACTION, BILATERAL    . COLONOSCOPY W/ POLYPECTOMY    . EP IMPLANTABLE DEVICE N/A 12/05/2015   Procedure: ICD Generator Changeout;  Surgeon: Duke Salvia, MD;  Location: [facility] INVASIVE CV LAB;  Service: Cardiovascular;  Laterality: N/A;  . HAMMER TOE SURGERY Left 01/15/2013   Procedure: LEFT SECOND THROUGH FOURTH HAMMERTOE CORRECTION ;  Surgeon: Toni Arthurs, MD;  Location: [facility] OR;  Service: Orthopedics;  Laterality: Left;  . HAMMERTOE RECONSTRUCTION WITH WEIL OSTEOTOMY Right 07/17/2012   Procedure: RIGHT SECOND AND THIRD MT WEIL OSTEOTOMIES AND SECOND AND THIRD HAMMERTOE CORRECTIONS;  Surgeon: Toni Arthurs, MD;  Location: [facility] OR;  Service: Orthopedics;  Laterality: Right;  . INSERT / REPLACE / REMOVE PACEMAKER    . TONSILLECTOMY    . TOTAL ABDOMINAL HYSTERECTOMY    . TOTAL KNEE ARTHROPLASTY Bilateral   . TUBAL LIGATION    . WEIL OSTEOTOMY Left 01/15/2013   Procedure:  WEIL OSTEOTOMY AND DORSAL CAPSULOTOMY;  Surgeon: Toni Arthurs, MD;  Location: [facility] OR;  Service: Orthopedics;  Laterality: Left;  Family History  Problem Relation Age of Onset  . Heart disease Mother   . Heart disease Father   . Colon polyps Sister   . Heart disease Brother   . Heart disease Sister   . Prostate cancer Brother   . Diabetes Daughter   . Diabetes Unknown   . Breast cancer Daughter   . Alcohol abuse Son   . Brain cancer Son   . Lung cancer Son   . Colon cancer Neg Hx    Social History  Substance Use Topics  . Smoking status: Never Smoker  . Smokeless tobacco: Never Used  . Alcohol use No   Current Medications and  Allergies:   .  albuterol (PROVENTIL HFA;VENTOLIN HFA) 108 (90 Base) MCG/ACT inhaler, Inhale 2 puffs into the lungs every 6 (six) hours as needed for wheezing or shortness of breath., Disp: 1 Inhaler, Rfl: 2 .  aspirin (ASPIR-81) 81 MG EC tablet, Take 81 mg by mouth daily.  , Disp: , Rfl:  .  beclomethasone (QVAR) 40 MCG/ACT inhaler, Inhale 2 puffs into the lungs 2 (two) times daily., Disp: 1 Inhaler, Rfl: 3 .  furosemide (LASIX) 20 MG tablet, Take 1 tablet (20 mg total) by mouth daily as needed for fluid or edema., Disp: 30 tablet, Rfl: 0 .  isosorbide mononitrate (IMDUR) 30 MG 24 hr tablet, TAKE ONE & ONE-HALF TABLETS BY MOUTH ONCE DAILY IN THE MORNING, Disp: 45 tablet, Rfl: 5 .  levothyroxine (SYNTHROID, LEVOTHROID) 100 MCG tablet, TAKE 1 TABLET BY MOUTH IN THE MORNING BEFORE BREAKFAST, Disp: 30 tablet, Rfl: 0 .  Liniments (SALONPAS PAIN RELIEF PATCH EX), Apply 1 patch topically daily as needed (pain). , Disp: , Rfl:  .  losartan (COZAAR) 50 MG tablet, TAKE 1 TABLET BY MOUTH ONCE DAILY  .  metoprolol succinate (TOPROL-XL) 50 MG 24 hr tablet, TAKE ONE TABLET BY MOUTH TWICE DAILY  .  nitroGLYCERIN (NITROSTAT) 0.4 MG SL tablet, Place 1 tablet (0.4 mg total) under the tongue every 5 (five) minutes as needed. At the onset of chest pain, Up to 3 doses, Disp: 25 tablet, Rfl: 1 .  pravastatin (PRAVACHOL) 20 MG tablet, TAKE 1 TABLET BY MOUTH ONCE DAILY, Disp: 90 tablet, Rfl: 0 .  Psyllium (METAFIBER) 48.57 % POWD, Take 10 mLs by mouth every evening. Mixed with water, Disp: , Rfl:   Allergies  Allergen Reactions  . Sulfonamide Derivatives Itching and Rash   Review of Systems:   Pertinent items are noted in the HPI. Otherwise, ROS is negative.  Vitals:   Vitals:   10/12/16 0926  BP: 116/66  Pulse: 76  Temp: 97.9 F (36.6 C)  TempSrc: Oral  SpO2: 95%  Weight: 122 lb 8 oz (55.6 kg)  Height:  (1.473 m)     Body mass index is 25.6 kg/m.   Physical Exam:   Physical Exam    Constitutional: She appears well-nourished.  HENT:  Head: Normocephalic and atraumatic.  Eyes: Pupils are equal, round, and reactive to light. EOM are normal.  Neck: Normal range of motion. Neck supple.  Cardiovascular: Normal rate, regular rhythm, normal heart sounds and intact distal pulses.   Pulmonary/Chest: Effort normal.  Abdominal: Soft.  Skin: Skin is warm.  Psychiatric: She has a normal mood and affect. Her behavior is normal.  Nursing note and vitals reviewed.  Assessment and Plan:   Jeryn was seen today for establish care.  Diagnoses and all orders for this visit:  Mixed hyperlipidemia Comments: Is the  patient taking medications without problems?   YES    NO Does the patient complain of muscle aches?     YES     NO Trying to exercise on a regular basis?   YES    NO Diet Compliance: compliant most of the time. Concerns: none.   Lipids:    Component Value Date/Time   CHOL 115 09/19/2015 1644   TRIG 137.0 09/19/2015 1644   HDL 37.50 (L) 09/19/2015 1644   VLDL 27.4 09/19/2015 1644   CHOLHDL 3 09/19/2015 1644    Essential (primary) hypertension Comments: Avoiding excessive salt intake?   YES    NO Trying to exercise on a regular basis?   YES    NO Review: taking medications as instructed, no medication side effects noted, no TIAs, no dyspnea on exertion, noting swelling of ankles.   Wt Readings from Last 3 Encounters:  10/12/16 122 lb 8 oz (55.6 kg)  08/14/16 126 lb (57.2 kg)  08/06/16 127 lb 4 oz (57.7 kg)   Reports that she has never smoked. She has never used smokeless tobacco.  BP Readings from Last 3 Encounters:  10/12/16 116/66  08/14/16 (!) 102/42  08/06/16 108/62   Lab Results  Component Value Date   CREATININE 0.91 08/06/2016   Chronic systolic heart failure (HCC) Comments: EF 45-50%. Taking Lasix around twice weekly. Minimal edema. Weight stable. No SOB. Followed by Cardiology. Note from 08/14/16 reviewed.   OSA  (obstructive sleep apnea) Comments: Using CPAP. No concerns.    . Reviewed expectations re: course of current medical issues. . Discussed self-management of symptoms. . Outlined signs and symptoms indicating need for more acute intervention. . Patient verbalized understanding and all questions were answered. [name] Health Maintenance issues including appropriate healthy diet, exercise, and smoking avoidance were discussed with patient. . See orders for this visit as documented in the electronic medical record. . Patient received an After Visit Summary.  Helane Rima, DO [place], [place] 10/13/2016  Future Appointments Date Time Provider Department Center  11/23/2016 10:30 AM Duke Salvia, MD [facility]  01/20/2017 8:35 AM [facility] DEVICE REMOTES [facility]  10/14/2017 11:30 AM Helane Rima, DO [facility] None

## 2016-10-25 ENCOUNTER — Other Ambulatory Visit: Payer: Self-pay | Admitting: Family Medicine

## 2016-10-30 ENCOUNTER — Other Ambulatory Visit: Payer: Self-pay | Admitting: Emergency Medicine

## 2016-10-30 MED ORDER — LEVOTHYROXINE SODIUM 100 MCG PO TABS: ORAL_TABLET | ORAL | 0 refills | Status: AC

## 2016-11-09 ENCOUNTER — Encounter: Payer: Self-pay | Admitting: Internal Medicine

## 2016-11-12 ENCOUNTER — Other Ambulatory Visit: Payer: Self-pay | Admitting: Family Medicine

## 2016-11-19 DIAGNOSIS — Q6211 Congenital occlusion of ureteropelvic junction: Secondary | ICD-10-CM | POA: Diagnosis not present

## 2016-11-23 ENCOUNTER — Ambulatory Visit (INDEPENDENT_AMBULATORY_CARE_PROVIDER_SITE_OTHER): Payer: Medicare Other | Admitting: Internal Medicine

## 2016-11-23 ENCOUNTER — Encounter: Payer: Self-pay | Admitting: Internal Medicine

## 2016-11-23 VITALS — BP 138/74 | HR 89 | Ht <= 58 in | Wt 125.0 lb

## 2016-11-23 DIAGNOSIS — I255 Ischemic cardiomyopathy: Secondary | ICD-10-CM | POA: Diagnosis not present

## 2016-11-23 DIAGNOSIS — I471 Supraventricular tachycardia: Secondary | ICD-10-CM | POA: Diagnosis not present

## 2016-11-23 DIAGNOSIS — Z95810 Presence of automatic (implantable) cardiac defibrillator: Secondary | ICD-10-CM | POA: Diagnosis not present

## 2016-11-23 DIAGNOSIS — I5022 Chronic systolic (congestive) heart failure: Secondary | ICD-10-CM | POA: Diagnosis not present

## 2016-11-23 DIAGNOSIS — Z515 Encounter for palliative care: Secondary | ICD-10-CM

## 2016-11-23 DIAGNOSIS — I4719 Other supraventricular tachycardia: Secondary | ICD-10-CM

## 2016-11-23 DIAGNOSIS — I2589 Other forms of chronic ischemic heart disease: Secondary | ICD-10-CM

## 2016-11-23 LAB — CUP PACEART INCLINIC DEVICE CHECK
Battery Voltage: 2.96 V
Brady Statistic RA Percent Paced: 0.33 %
Brady Statistic RV Percent Paced: 84 %
Implantable Lead Implant Date: 20051206
Implantable Lead Location: 753859
Implantable Pulse Generator Implant Date: 20171127
Lead Channel Impedance Value: 412.5 Ohm
Lead Channel Pacing Threshold Amplitude: 0.75 V
Lead Channel Pacing Threshold Amplitude: 1.5 V
Lead Channel Pacing Threshold Amplitude: 1.75 V
Lead Channel Pacing Threshold Pulse Width: 0.4 ms
Lead Channel Pacing Threshold Pulse Width: 1 ms
Lead Channel Pacing Threshold Pulse Width: 1 ms
Lead Channel Pacing Threshold Pulse Width: 1.2 ms
Lead Channel Sensing Intrinsic Amplitude: 2 mV
Lead Channel Setting Pacing Amplitude: 2 V
Lead Channel Setting Sensing Sensitivity: 1.5 mV
MDC IDC LEAD IMPLANT DT: 20051206
MDC IDC LEAD IMPLANT DT: 20051206
MDC IDC LEAD LOCATION: 753858
MDC IDC LEAD LOCATION: 753860
MDC IDC MSMT BATTERY REMAINING LONGEVITY: 61 mo
MDC IDC MSMT LEADCHNL LV IMPEDANCE VALUE: 400 Ohm
MDC IDC MSMT LEADCHNL LV PACING THRESHOLD AMPLITUDE: 1.75 V
MDC IDC MSMT LEADCHNL LV PACING THRESHOLD PULSEWIDTH: 1.2 ms
MDC IDC MSMT LEADCHNL RA PACING THRESHOLD AMPLITUDE: 0.75 V
MDC IDC MSMT LEADCHNL RA PACING THRESHOLD PULSEWIDTH: 0.4 ms
MDC IDC MSMT LEADCHNL RV IMPEDANCE VALUE: 712.5 Ohm
MDC IDC MSMT LEADCHNL RV PACING THRESHOLD AMPLITUDE: 1.5 V
MDC IDC MSMT LEADCHNL RV SENSING INTR AMPL: 4.3 mV
MDC IDC SESS DTM: 20181116133412
MDC IDC SET LEADCHNL LV PACING AMPLITUDE: 2.5 V
MDC IDC SET LEADCHNL LV PACING PULSEWIDTH: 1.2 ms
MDC IDC SET LEADCHNL RV PACING AMPLITUDE: 2.5 V
MDC IDC SET LEADCHNL RV PACING PULSEWIDTH: 1 ms
Pulse Gen Serial Number: 7855837

## 2016-11-23 NOTE — Progress Notes (Signed)
Patient Care Team: Helane RimaWallace, Erica, DO as PCP - General (Family Medicine) Tat, Octaviano Battyebecca S, DO as Consulting Physician (Neurology) Hart CarwinBrodie, Dora M, MD (Inactive) as Consulting Physician (Gastroenterology) Duke SalviaKlein, Georgia Delsignore C, MD as Consulting Physician (Cardiology) Lenda KelpHudnall, Shane R, MD as Consulting Physician (Sports Medicine)   HPI  [name] is a 83 y.o. female Seen in followup for coronary artery disease with multiple prior PCI and congestive heart failure. She has an ischemic cardiomyopathy and is status post CRT-D implantation with interval improvement. Generator replacement in November 2011; again 11/17 with downgrade to CRT-P.   She has an ICD discharge; however, there were no symptoms.  She was hospitalized in August for a kidney stone.  She continues to have urological problems with ureteral dilatation.  Urology consultation is pending.  She is also had problems with esophageal strictures and is undergone multiple dilatations.  She is under the understanding that this is no longer possible.  She has a history of a hiatal hernia.  She has noted of late dysphonia, coughing while eating, coughing unrelated to eating and generalized weakness.  She has some mild shortness of breath but mostly she describes fatigue.  There is been only trace edema.   She has a RIATA 1500 lead in place  [facility] RCA stent site patent, PL branch of the RCA 50%, dCFX 80% treated with Taxus DES  Nuclear study a 12/10: No scar or ischemia, EF 54%.  Repeat nuclear study 1/14 because of chest pain demonstrated no significant ischemia. Ejection fraction is measured at 35% although visualization thought to be about 55-60% with normal wall motion      Past Medical History:  Diagnosis Date  . Anxiety   . Arthritis   . Automatic implantable cardioverter/defibrillator (AICD) activation    s/p implantable cardioverter-defibrillator bivenrticular pacer. 12/05. St, Jude Medtronictlas. remote-no.  [name]. CAD  (coronary artery disease)    a. s/p prior anterior wall myocardial infarction and multiple percutaneous coronary intervention.;  b. Aden MV 1/14:  low risk, inf and apical defect likely due to atten artifact, no ischemia, EF 35% (visually normal and est 55-60%)  . Chest pain 12/09   recent hospitalization for it. felt to be noncardiac.   [name]. CHF (congestive heart failure) (HCC)   . DD (diverticular disease)    Hx of it - severe. Left colon 2004  . Esophageal stricture    last dialated 2007  . GERD (gastroesophageal reflux disease)   . Hiatal hernia   . HTN (hypertension)   . Hyperlipidemia   . Hypothyroidism   . Ischemic cardiomyopathy    EF 25-30% improved to 45% with biventricular pacing.   [name]. PVT (paroxysmal ventricular tachycardia) (HCC)   . Sleep apnea    has a Cpap machine  . Systolic heart failure    improved to class I to II   . UTI (lower urinary tract infection)    recurrent. (Klebsiella pneumoniae). Last Cx 02/03/09. (R-ancef, nitrofurantoin, Augmentin, Zyosyn)    Past Surgical History:  Procedure Laterality Date  . ANGIOPLASTY     stent  . APPENDECTOMY    . CARDIAC CATHETERIZATION     stent  . CARDIAC DEFIBRILLATOR PLACEMENT     St Jude Atlas  . CATARACT EXTRACTION, BILATERAL    . COLONOSCOPY W/ POLYPECTOMY    . ICD Generator Changeout N/A 12/05/2015   Performed by Duke SalviaKlein, Lequan Dobratz C[facility]  . INSERT / REPLACE / REMOVE PACEMAKER    .  LEFT SECOND THROUGH FOURTH HAMMERTOE CORRECTION Left 01/15/2013   Performed by Toni ArthursHewitt, John, [facility]  . RIGHT SECOND AND THIRD MT WEIL OSTEOTOMIES AND SECOND AND THIRD HAMMERTOE CORRECTIONS Right 07/17/2012   Performed by Toni [facility]ursHewitt, John, [facility]  . TONSILLECTOMY    . TOTAL ABDOMINAL HYSTERECTOMY    . TOTAL KNEE ARTHROPLASTY Bilateral   . TUBAL LIGATION    . WEIL OSTEOTOMY AND DORSAL CAPSULOTOMY Left 01/15/2013   Performed [facility] Toni [facility]ursHewitt, John[facility]      Allergies  Allergen Reactions  . Sulfonamide  Derivatives Itching and Rash    Review of Systems negative except from HPI and PMH  Physical Exam BP 138/74   Pulse 89   Ht 4\' 10"  (1.473 m)   Wt 125 lb (56.7 kg)   SpO2 97%   BMI 26.13 kg/m  Well developed and well nourished in no acute distress HENT normal E scleral and icterus clear Neck Supple Marked kyphosis Clear to ausculation status post Regular rate and rhythm, no murmurs gallops or rub Soft with active bowel sounds No clubbing cyanosis trace Edema Alert and oriented, grossly normal motor and sensory function Skin Warm and Dry  ECG demonstrates P. synchronous biventricular pacing @ 73 15/13/43 QRS QR lead I and RS lead V1    Assessment and  Plan  Atrial tachycardia/fibrillation  Ischemic cardiomyopathy  Hypertension  CRT pacemaker St. Jude (downgrade from ICD)  HFpEF/HFrEF  Cough  End-of-life discussion  There is no evidence of volume overload.  I do not think her cough is related to her heart.  I am more concerned that in the context of her known hiatal hernia, progressive esophageal stricture, and some kind of recent swallowing study showing impaired swallowing that the voice issues and the cough issues relate to oral pharyngeal dysfunction and possible aspiration.  She is under the understanding that her stricture will progress and then it will be done.  We discussed this in the consequence of end-of-life decisions and quality of life related to the possibility of stricture bypass with a G-tube also something apparently that the GI physicians had mentioned.  Her granddaughter who is a primary caretaker does not recall the aforementioned discussion.  I have suggested that they revisit this with the GI team for trying to understand a) what is the natural history of the esophageal stricture, B) is there evidence of microaspiration giving rise to her symptoms and what can be done in the interim to address that.  I have also encouraged him to follow-up  with the urologist as this may also explain some of her systemic symptoms.  More than 50% of 45 min was spent in counseling related to the above

## 2016-11-23 NOTE — Patient Instructions (Signed)
Your physician recommends that you continue on your current medications as directed. Please refer to the Current Medication list given to you today.   Your physician wants you to follow-up in: YEAR WITH DR KLEIN  You will receive a reminder letter in the mail two months in advance. If you don't receive a letter, please call our office to schedule the follow-up appointment.  

## 2016-12-05 DIAGNOSIS — N1330 Unspecified hydronephrosis: Secondary | ICD-10-CM | POA: Diagnosis not present

## 2016-12-05 DIAGNOSIS — N200 Calculus of kidney: Secondary | ICD-10-CM | POA: Diagnosis not present

## 2016-12-05 DIAGNOSIS — N130 Hydronephrosis with ureteropelvic junction obstruction: Secondary | ICD-10-CM | POA: Diagnosis not present

## 2016-12-13 DIAGNOSIS — Q6211 Congenital occlusion of ureteropelvic junction: Secondary | ICD-10-CM | POA: Diagnosis not present

## 2016-12-14 DIAGNOSIS — Q6211 Congenital occlusion of ureteropelvic junction: Secondary | ICD-10-CM | POA: Diagnosis not present

## 2016-12-14 DIAGNOSIS — N3000 Acute cystitis without hematuria: Secondary | ICD-10-CM | POA: Diagnosis not present

## 2016-12-20 ENCOUNTER — Ambulatory Visit: Payer: Medicare Other | Admitting: Family Medicine

## 2016-12-20 ENCOUNTER — Ambulatory Visit: Payer: Self-pay | Admitting: Family Medicine

## 2016-12-20 ENCOUNTER — Inpatient Hospital Stay (HOSPITAL_COMMUNITY)
Admission: EM | Admit: 2016-12-20 | Discharge: 2016-12-27 | DRG: 308 | Disposition: A | Discharge status: Skilled Nursing Facility | Payer: Medicare Other | Attending: Family Medicine | Admitting: Family Medicine

## 2016-12-20 ENCOUNTER — Emergency Department (HOSPITAL_COMMUNITY): Payer: Medicare Other

## 2016-12-20 ENCOUNTER — Other Ambulatory Visit: Payer: Self-pay

## 2016-12-20 ENCOUNTER — Encounter (HOSPITAL_COMMUNITY): Payer: Self-pay | Admitting: Emergency Medicine

## 2016-12-20 DIAGNOSIS — I5021 Acute systolic (congestive) heart failure: Secondary | ICD-10-CM | POA: Diagnosis not present

## 2016-12-20 DIAGNOSIS — R000 Tachycardia, unspecified: Secondary | ICD-10-CM | POA: Diagnosis present

## 2016-12-20 DIAGNOSIS — Z803 Family history of malignant neoplasm of breast: Secondary | ICD-10-CM

## 2016-12-20 DIAGNOSIS — I4892 Unspecified atrial flutter: Secondary | ICD-10-CM

## 2016-12-20 DIAGNOSIS — M545 Low back pain: Secondary | ICD-10-CM | POA: Diagnosis not present

## 2016-12-20 DIAGNOSIS — Z95810 Presence of automatic (implantable) cardiac defibrillator: Secondary | ICD-10-CM

## 2016-12-20 DIAGNOSIS — N179 Acute kidney failure, unspecified: Secondary | ICD-10-CM | POA: Diagnosis present

## 2016-12-20 DIAGNOSIS — Z79890 Hormone replacement therapy (postmenopausal): Secondary | ICD-10-CM

## 2016-12-20 DIAGNOSIS — R4020 Unspecified coma: Secondary | ICD-10-CM | POA: Diagnosis not present

## 2016-12-20 DIAGNOSIS — R627 Adult failure to thrive: Secondary | ICD-10-CM | POA: Diagnosis present

## 2016-12-20 DIAGNOSIS — I499 Cardiac arrhythmia, unspecified: Secondary | ICD-10-CM | POA: Diagnosis not present

## 2016-12-20 DIAGNOSIS — Z66 Do not resuscitate: Secondary | ICD-10-CM | POA: Diagnosis present

## 2016-12-20 DIAGNOSIS — I420 Dilated cardiomyopathy: Secondary | ICD-10-CM | POA: Diagnosis not present

## 2016-12-20 DIAGNOSIS — K219 Gastro-esophageal reflux disease without esophagitis: Secondary | ICD-10-CM | POA: Diagnosis present

## 2016-12-20 DIAGNOSIS — G8929 Other chronic pain: Secondary | ICD-10-CM | POA: Diagnosis present

## 2016-12-20 DIAGNOSIS — I509 Heart failure, unspecified: Secondary | ICD-10-CM | POA: Diagnosis not present

## 2016-12-20 DIAGNOSIS — R0600 Dyspnea, unspecified: Secondary | ICD-10-CM | POA: Diagnosis not present

## 2016-12-20 DIAGNOSIS — M25551 Pain in right hip: Secondary | ICD-10-CM | POA: Diagnosis present

## 2016-12-20 DIAGNOSIS — E860 Dehydration: Secondary | ICD-10-CM | POA: Diagnosis present

## 2016-12-20 DIAGNOSIS — I700 Atherosclerosis of aorta: Secondary | ICD-10-CM | POA: Diagnosis present

## 2016-12-20 DIAGNOSIS — I4729 Other ventricular tachycardia: Secondary | ICD-10-CM

## 2016-12-20 DIAGNOSIS — Z79899 Other long term (current) drug therapy: Secondary | ICD-10-CM

## 2016-12-20 DIAGNOSIS — M549 Dorsalgia, unspecified: Secondary | ICD-10-CM | POA: Diagnosis not present

## 2016-12-20 DIAGNOSIS — Z7982 Long term (current) use of aspirin: Secondary | ICD-10-CM

## 2016-12-20 DIAGNOSIS — I5023 Acute on chronic systolic (congestive) heart failure: Secondary | ICD-10-CM | POA: Diagnosis not present

## 2016-12-20 DIAGNOSIS — J810 Acute pulmonary edema: Secondary | ICD-10-CM | POA: Diagnosis not present

## 2016-12-20 DIAGNOSIS — I10 Essential (primary) hypertension: Secondary | ICD-10-CM | POA: Diagnosis present

## 2016-12-20 DIAGNOSIS — R1310 Dysphagia, unspecified: Secondary | ICD-10-CM

## 2016-12-20 DIAGNOSIS — I2510 Atherosclerotic heart disease of native coronary artery without angina pectoris: Secondary | ICD-10-CM | POA: Diagnosis present

## 2016-12-20 DIAGNOSIS — I483 Typical atrial flutter: Secondary | ICD-10-CM | POA: Diagnosis not present

## 2016-12-20 DIAGNOSIS — K449 Diaphragmatic hernia without obstruction or gangrene: Secondary | ICD-10-CM | POA: Diagnosis present

## 2016-12-20 DIAGNOSIS — S79911A Unspecified injury of right hip, initial encounter: Secondary | ICD-10-CM | POA: Diagnosis not present

## 2016-12-20 DIAGNOSIS — E785 Hyperlipidemia, unspecified: Secondary | ICD-10-CM | POA: Diagnosis present

## 2016-12-20 DIAGNOSIS — M5136 Other intervertebral disc degeneration, lumbar region: Secondary | ICD-10-CM | POA: Diagnosis not present

## 2016-12-20 DIAGNOSIS — J9811 Atelectasis: Secondary | ICD-10-CM | POA: Diagnosis present

## 2016-12-20 DIAGNOSIS — I255 Ischemic cardiomyopathy: Secondary | ICD-10-CM | POA: Diagnosis present

## 2016-12-20 DIAGNOSIS — N136 Pyonephrosis: Secondary | ICD-10-CM | POA: Diagnosis present

## 2016-12-20 DIAGNOSIS — Z96653 Presence of artificial knee joint, bilateral: Secondary | ICD-10-CM | POA: Diagnosis present

## 2016-12-20 DIAGNOSIS — I252 Old myocardial infarction: Secondary | ICD-10-CM | POA: Diagnosis not present

## 2016-12-20 DIAGNOSIS — K222 Esophageal obstruction: Secondary | ICD-10-CM | POA: Diagnosis present

## 2016-12-20 DIAGNOSIS — I472 Ventricular tachycardia: Secondary | ICD-10-CM | POA: Diagnosis present

## 2016-12-20 DIAGNOSIS — I2589 Other forms of chronic ischemic heart disease: Secondary | ICD-10-CM | POA: Diagnosis not present

## 2016-12-20 DIAGNOSIS — R531 Weakness: Secondary | ICD-10-CM | POA: Diagnosis not present

## 2016-12-20 DIAGNOSIS — Z955 Presence of coronary angioplasty implant and graft: Secondary | ICD-10-CM

## 2016-12-20 DIAGNOSIS — Z8249 Family history of ischemic heart disease and other diseases of the circulatory system: Secondary | ICD-10-CM

## 2016-12-20 DIAGNOSIS — R001 Bradycardia, unspecified: Secondary | ICD-10-CM | POA: Diagnosis present

## 2016-12-20 DIAGNOSIS — Z515 Encounter for palliative care: Secondary | ICD-10-CM | POA: Diagnosis not present

## 2016-12-20 DIAGNOSIS — E039 Hypothyroidism, unspecified: Secondary | ICD-10-CM | POA: Diagnosis present

## 2016-12-20 DIAGNOSIS — Z87440 Personal history of urinary (tract) infections: Secondary | ICD-10-CM | POA: Diagnosis not present

## 2016-12-20 DIAGNOSIS — K228 Other specified diseases of esophagus: Secondary | ICD-10-CM | POA: Diagnosis present

## 2016-12-20 DIAGNOSIS — E43 Unspecified severe protein-calorie malnutrition: Secondary | ICD-10-CM | POA: Diagnosis present

## 2016-12-20 DIAGNOSIS — Z833 Family history of diabetes mellitus: Secondary | ICD-10-CM

## 2016-12-20 DIAGNOSIS — I361 Nonrheumatic tricuspid (valve) insufficiency: Secondary | ICD-10-CM | POA: Diagnosis not present

## 2016-12-20 DIAGNOSIS — E440 Moderate protein-calorie malnutrition: Secondary | ICD-10-CM | POA: Diagnosis present

## 2016-12-20 DIAGNOSIS — Z5309 Procedure and treatment not carried out because of other contraindication: Secondary | ICD-10-CM | POA: Diagnosis not present

## 2016-12-20 DIAGNOSIS — E871 Hypo-osmolality and hyponatremia: Secondary | ICD-10-CM | POA: Diagnosis not present

## 2016-12-20 DIAGNOSIS — D649 Anemia, unspecified: Secondary | ICD-10-CM | POA: Diagnosis not present

## 2016-12-20 DIAGNOSIS — I482 Chronic atrial fibrillation: Secondary | ICD-10-CM | POA: Diagnosis present

## 2016-12-20 DIAGNOSIS — Z9181 History of falling: Secondary | ICD-10-CM

## 2016-12-20 DIAGNOSIS — G4733 Obstructive sleep apnea (adult) (pediatric): Secondary | ICD-10-CM | POA: Diagnosis present

## 2016-12-20 DIAGNOSIS — I959 Hypotension, unspecified: Secondary | ICD-10-CM | POA: Diagnosis present

## 2016-12-20 DIAGNOSIS — Z801 Family history of malignant neoplasm of trachea, bronchus and lung: Secondary | ICD-10-CM

## 2016-12-20 DIAGNOSIS — R634 Abnormal weight loss: Secondary | ICD-10-CM | POA: Diagnosis not present

## 2016-12-20 HISTORY — DX: Left bundle-branch block, unspecified: I44.7

## 2016-12-20 HISTORY — DX: Chronic systolic (congestive) heart failure: I50.22

## 2016-12-20 LAB — CBC WITH DIFFERENTIAL/PLATELET
Basophils Absolute: 0 10*3/uL (ref 0.0–0.1)
Basophils Relative: 0 %
Eosinophils Absolute: 0.1 10*3/uL (ref 0.0–0.7)
Eosinophils Relative: 1 %
HEMATOCRIT: 33.7 % — AB (ref 36.0–46.0)
HEMOGLOBIN: 11.7 g/dL — AB (ref 12.0–15.0)
LYMPHS ABS: 0.7 10*3/uL (ref 0.7–4.0)
LYMPHS PCT: 7 %
MCH: 30.6 pg (ref 26.0–34.0)
MCHC: 34.7 g/dL (ref 30.0–36.0)
MCV: 88.2 fL (ref 78.0–100.0)
MONOS PCT: 12 %
Monocytes Absolute: 1.2 10*3/uL — ABNORMAL HIGH (ref 0.1–1.0)
NEUTROS ABS: 8.1 10*3/uL — AB (ref 1.7–7.7)
NEUTROS PCT: 80 %
Platelets: 225 10*3/uL (ref 150–400)
RBC: 3.82 MIL/uL — ABNORMAL LOW (ref 3.87–5.11)
RDW: 13.1 % (ref 11.5–15.5)
WBC: 10 10*3/uL (ref 4.0–10.5)

## 2016-12-20 LAB — COMPREHENSIVE METABOLIC PANEL
ALK PHOS: 64 U/L (ref 38–126)
ALT: 14 U/L (ref 14–54)
ANION GAP: 9 (ref 5–15)
AST: 18 U/L (ref 15–41)
Albumin: 2.9 g/dL — ABNORMAL LOW (ref 3.5–5.0)
BILIRUBIN TOTAL: 0.7 mg/dL (ref 0.3–1.2)
BUN: 72 mg/dL — ABNORMAL HIGH (ref 6–20)
CALCIUM: 9.4 mg/dL (ref 8.9–10.3)
CO2: 25 mmol/L (ref 22–32)
CREATININE: 1.6 mg/dL — AB (ref 0.44–1.00)
Chloride: 93 mmol/L — ABNORMAL LOW (ref 101–111)
GFR calc non Af Amer: 27 mL/min — ABNORMAL LOW (ref >60)
GFR, EST AFRICAN AMERICAN: 31 mL/min — AB (ref >60)
GLUCOSE: 127 mg/dL — AB (ref 65–99)
Potassium: 5 mmol/L (ref 3.5–5.1)
Sodium: 127 mmol/L — ABNORMAL LOW (ref 135–145)
TOTAL PROTEIN: 6.4 g/dL — AB (ref 6.5–8.1)

## 2016-12-20 LAB — I-STAT CHEM 8, ED
BUN: 61 mg/dL — ABNORMAL HIGH (ref 6–20)
Calcium, Ion: 1.21 mmol/L (ref 1.15–1.40)
Chloride: 94 mmol/L — ABNORMAL LOW (ref 101–111)
Creatinine, Ser: 1.6 mg/dL — ABNORMAL HIGH (ref 0.44–1.00)
GLUCOSE: 122 mg/dL — AB (ref 65–99)
HEMATOCRIT: 37 % (ref 36.0–46.0)
HEMOGLOBIN: 12.6 g/dL (ref 12.0–15.0)
POTASSIUM: 4.8 mmol/L (ref 3.5–5.1)
Sodium: 128 mmol/L — ABNORMAL LOW (ref 135–145)
TCO2: 26 mmol/L (ref 22–32)

## 2016-12-20 LAB — URINALYSIS, ROUTINE W REFLEX MICROSCOPIC
Bacteria, UA: NONE SEEN
Bilirubin Urine: NEGATIVE
GLUCOSE, UA: NEGATIVE mg/dL
Hgb urine dipstick: NEGATIVE
KETONES UR: NEGATIVE mg/dL
Nitrite: NEGATIVE
PH: 5 (ref 5.0–8.0)
Protein, ur: NEGATIVE mg/dL
SPECIFIC GRAVITY, URINE: 1.013 (ref 1.005–1.030)

## 2016-12-20 LAB — MRSA PCR SCREENING: MRSA by PCR: NEGATIVE

## 2016-12-20 LAB — NA AND K (SODIUM & POTASSIUM), RAND UR: POTASSIUM UR: 22 mmol/L

## 2016-12-20 LAB — I-STAT TROPONIN, ED: Troponin i, poc: 0.01 ng/mL (ref 0.00–0.08)

## 2016-12-20 LAB — OSMOLALITY, URINE: Osmolality, Ur: 423 mOsm/kg (ref 300–900)

## 2016-12-20 MED ORDER — ACETAMINOPHEN 650 MG RE SUPP: 650.0000 mg | Freq: Four times a day (QID) | RECTAL | Status: DC | PRN

## 2016-12-20 MED ORDER — LIP MEDEX EX OINT
TOPICAL_OINTMENT | CUTANEOUS | Status: AC
  Administered 2016-12-20: 1
  Filled 2016-12-20: qty 7

## 2016-12-20 MED ORDER — DILTIAZEM HCL-DEXTROSE 100-5 MG/100ML-% IV SOLN (PREMIX)
5.0000 mg/h | INTRAVENOUS | Status: DC
  Administered 2016-12-20 (×2): 5 mg/h via INTRAVENOUS
  Filled 2016-12-20 (×2): qty 100

## 2016-12-20 MED ORDER — TRAMADOL HCL 50 MG PO TABS: 50.0000 mg | ORAL_TABLET | Freq: Two times a day (BID) | ORAL | Status: DC | PRN

## 2016-12-20 MED ORDER — DILTIAZEM LOAD VIA INFUSION
5.0000 mg | Freq: Once | INTRAVENOUS | Status: AC
Start: 2016-12-20 — End: 2016-12-20
  Administered 2016-12-20: 5 mg via INTRAVENOUS
  Filled 2016-12-20: qty 5

## 2016-12-20 MED ORDER — ENOXAPARIN SODIUM 30 MG/0.3ML ~~LOC~~ SOLN
30.0000 mg | SUBCUTANEOUS | Status: DC
  Administered 2016-12-20 – 2016-12-26 (×7): 30 mg via SUBCUTANEOUS
  Filled 2016-12-20 (×8): qty 0.3

## 2016-12-20 MED ORDER — DILTIAZEM HCL-DEXTROSE 100-5 MG/100ML-% IV SOLN (PREMIX): 5.0000 mg/h | INTRAVENOUS | Status: DC

## 2016-12-20 MED ORDER — ACETAMINOPHEN 500 MG PO TABS
1000.0000 mg | ORAL_TABLET | Freq: Once | ORAL | Status: AC
  Administered 2016-12-20: 1000 mg via ORAL
  Filled 2016-12-20: qty 2

## 2016-12-20 MED ORDER — AMOXICILLIN 250 MG/5ML PO SUSR
500.0000 mg | Freq: Two times a day (BID) | ORAL | Status: DC
  Administered 2016-12-20 – 2016-12-27 (×13): 500 mg via ORAL
  Filled 2016-12-20 (×14): qty 10

## 2016-12-20 MED ORDER — SODIUM CHLORIDE 0.9 % IV BOLUS (SEPSIS)
1000.0000 mL | Freq: Once | INTRAVENOUS | Status: AC
  Administered 2016-12-20: 1000 mL via INTRAVENOUS

## 2016-12-20 MED ORDER — PRAVASTATIN SODIUM 20 MG PO TABS
20.0000 mg | ORAL_TABLET | Freq: Every day | ORAL | Status: DC
Start: 2016-12-21 — End: 2016-12-27
  Administered 2016-12-21 – 2016-12-27 (×7): 20 mg via ORAL
  Filled 2016-12-20 (×7): qty 1

## 2016-12-20 MED ORDER — RISAQUAD PO CAPS
1.0000 | ORAL_CAPSULE | Freq: Every day | ORAL | Status: DC
  Administered 2016-12-20 – 2016-12-27 (×8): 1 via ORAL
  Filled 2016-12-20 (×9): qty 1

## 2016-12-20 MED ORDER — BUDESONIDE 0.25 MG/2ML IN SUSP
0.2500 mg | Freq: Two times a day (BID) | RESPIRATORY_TRACT | Status: DC
  Administered 2016-12-20 – 2016-12-27 (×12): 0.25 mg via RESPIRATORY_TRACT
  Filled 2016-12-20 (×14): qty 2

## 2016-12-20 MED ORDER — SODIUM CHLORIDE 0.9 % IV SOLN
INTRAVENOUS | Status: AC
  Administered 2016-12-20: 19:00:00 via INTRAVENOUS

## 2016-12-20 MED ORDER — POTASSIUM CHLORIDE IN NACL 20-0.9 MEQ/L-% IV SOLN: INTRAVENOUS | Status: DC

## 2016-12-20 MED ORDER — OXYCODONE HCL 5 MG PO TABS
5.0000 mg | ORAL_TABLET | Freq: Four times a day (QID) | ORAL | Status: DC | PRN
  Administered 2016-12-21 – 2016-12-22 (×3): 5 mg via ORAL
  Filled 2016-12-20 (×3): qty 1

## 2016-12-20 MED ORDER — ONDANSETRON HCL 4 MG/2ML IJ SOLN: 4.0000 mg | Freq: Four times a day (QID) | INTRAMUSCULAR | Status: DC | PRN

## 2016-12-20 MED ORDER — DILTIAZEM LOAD VIA INFUSION: 20.0000 mg | Freq: Once | INTRAVENOUS | Status: DC

## 2016-12-20 MED ORDER — ACETAMINOPHEN 325 MG PO TABS: 650.0000 mg | ORAL_TABLET | Freq: Four times a day (QID) | ORAL | Status: DC | PRN

## 2016-12-20 MED ORDER — POLYETHYLENE GLYCOL 3350 17 G PO PACK
17.0000 g | PACK | Freq: Every day | ORAL | Status: DC | PRN
  Administered 2016-12-20 – 2016-12-21 (×2): 17 g via ORAL
  Filled 2016-12-20 (×2): qty 1

## 2016-12-20 MED ORDER — DIPHENHYDRAMINE HCL 25 MG PO CAPS: 25.0000 mg | ORAL_CAPSULE | Freq: Four times a day (QID) | ORAL | Status: DC | PRN

## 2016-12-20 MED ORDER — ONDANSETRON HCL 4 MG PO TABS: 4.0000 mg | ORAL_TABLET | Freq: Four times a day (QID) | ORAL | Status: DC | PRN

## 2016-12-20 MED ORDER — LEVOTHYROXINE SODIUM 100 MCG PO TABS
100.0000 ug | ORAL_TABLET | Freq: Every day | ORAL | Status: DC
  Administered 2016-12-21 – 2016-12-27 (×7): 100 ug via ORAL
  Filled 2016-12-20 (×7): qty 1

## 2016-12-20 MED ORDER — ASPIRIN EC 81 MG PO TBEC
81.0000 mg | DELAYED_RELEASE_TABLET | Freq: Every day | ORAL | Status: DC
  Administered 2016-12-21 – 2016-12-27 (×7): 81 mg via ORAL
  Filled 2016-12-20 (×7): qty 1

## 2016-12-20 MED ORDER — BECLOMETHASONE DIPROPIONATE 40 MCG/ACT IN AERS: 2.0000 | INHALATION_SPRAY | Freq: Two times a day (BID) | RESPIRATORY_TRACT | Status: DC

## 2016-12-20 NOTE — ED Notes (Signed)
St Jude pacemaker representative at patient bedside to interrogate pacer.

## 2016-12-20 NOTE — ED Notes (Signed)
Patient reports that she did fall 2 weeks ago. dont know if the right hip pain is from fall or not.

## 2016-12-20 NOTE — ED Provider Notes (Signed)
[facility]-ICU/STEPDOWN Provider Note   CSN: [id number] Arrival date & time: 12/20/16  1159     History   Chief Complaint Chief Complaint  Patient presents with  . not eating  . Hip Pain  . Fatigue    HPI [name] is a 83 y.o. female.  83 yo F with a chief complaint of a progressive decline over the past month or so.  She lives at home alone, lives near some of her family members who check on her daily.  She has been complaining of right-sided low back pain that goes down her leg as well as some epigastric pain.  These have been coming and going.  In the last time she had the epigastric pain was 2 days ago.  Last night she had with a referred to severe right-sided low back pain.  Currently the patient denies any pain.  States that it comes and goes.  She denies any fevers or chills.  Denies cough or congestion.  Denies vomiting nausea or diarrhea.  Denies any rashes.  She recently started on an antibiotic for urinary tract infection.   The history is provided by the patient.  Illness  This is a new problem. The current episode started more than 1 week ago. The problem occurs constantly. The problem has not changed since onset.Pertinent negatives include no chest pain, no abdominal pain, no headaches and no shortness of breath. Nothing aggravates the symptoms. Nothing relieves the symptoms. She has tried nothing for the symptoms. The treatment provided no relief.    Past Medical History:  Diagnosis Date  . Anxiety   . Arthritis   . CAD (coronary artery disease)    a. h/o anterior wall MI. b. stenting of RCAx2 in 05/2001. c. DES to Cx in 2005 (last cath).  . Chronic systolic CHF (congestive heart failure) (HCC)   . DD (diverticular disease)    Hx of it - severe. Left colon 2004  . Esophageal stricture    last dialated 2007  . GERD (gastroesophageal reflux disease)   . Hiatal hernia   . HTN (hypertension)   . Hyperlipidemia   . Hypothyroidism     . Ischemic cardiomyopathy    EF 25-30% improved to 45% with biventricular pacing.   [name] LBBB (left bundle branch block)   . Pacemaker    a. had prior ICD, but this was downgraded to CRT-P in 2017.  [name] PVT (paroxysmal ventricular tachycardia) (HCC)   . Sleep apnea    has a Cpap machine  . UTI (lower urinary tract infection)    recurrent. (Klebsiella pneumoniae). Last Cx 02/03/09. (R-ancef, nitrofurantoin, Augmentin, Zyosyn)    Patient Active Problem List   Diagnosis Date Noted  . Tachycardia 12/20/2016  . Degenerative disc disease, lumbar   . Kidney stone 08/06/2016  . Hydronephrosis   . Malnutrition of moderate degree 07/28/2016  . CAD (coronary artery disease)   . OSA (obstructive sleep apnea) 10/01/2014  . Nocturnal hypoxia 08/27/2014  . Hammer toe 03/06/2012  . Anxiety 01/25/2012  . Cervical dystonia 12/19/2011  . Tremor 12/17/2011  . Osteoporosis 08/07/2011  . Edema 05/24/2011  . Seasonal allergic rhinitis 05/24/2011  . Foot joint pain 04/26/2011  . Memory changes 04/16/2011  . Headache causing frequent awakening from sleep 04/16/2011  . Constipation, slow transit 04/16/2011  . Hypothyroid 02/06/2011  . Biventricular cardiac pacemaker in situ 07/25/2010  . St. Jude Riata lead-series of 1500 07/25/2010  . Paroxysmal ventricular tachycardia (HCC) 11/14/2009  .  Chronic cystitis 02/25/2009  . History of recurrent UTIs 02/25/2009  . Female stress incontinence 02/25/2009  . Postmenopausal atrophic vaginitis 02/25/2009  . Esophageal stricture 09/03/2008  . Hyperlipidemia 10/22/2007  . Essential (primary) hypertension 10/22/2007  . Ischemic cardiomyopathy 10/22/2007  . Chronic systolic heart failure (HCC) 10/22/2007    Past Surgical History:  Procedure Laterality Date  . ANGIOPLASTY     stent  . APPENDECTOMY    . CARDIAC CATHETERIZATION     stent  . CARDIAC DEFIBRILLATOR PLACEMENT     St Jude Atlas  . CATARACT EXTRACTION, BILATERAL    . COLONOSCOPY W/ POLYPECTOMY     . EP IMPLANTABLE DEVICE N/A 12/05/2015   Procedure: ICD Generator Changeout;  Surgeon: Duke Salvia, MD;  Location: [facility] INVASIVE CV LAB;  Service: Cardiovascular;  Laterality: N/A;  . HAMMER TOE SURGERY Left 01/15/2013   Procedure: LEFT SECOND THROUGH FOURTH HAMMERTOE CORRECTION ;  Surgeon: Toni Arthurs, MD;  Location: [facility] OR;  Service: Orthopedics;  Laterality: Left;  . HAMMERTOE RECONSTRUCTION WITH WEIL OSTEOTOMY Right 07/17/2012   Procedure: RIGHT SECOND AND THIRD MT WEIL OSTEOTOMIES AND SECOND AND THIRD HAMMERTOE CORRECTIONS;  Surgeon: Toni Arthurs, MD;  Location: [facility] OR;  Service: Orthopedics;  Laterality: Right;  . INSERT / REPLACE / REMOVE PACEMAKER    . TONSILLECTOMY    . TOTAL ABDOMINAL HYSTERECTOMY    . TOTAL KNEE ARTHROPLASTY Bilateral   . TUBAL LIGATION    . WEIL OSTEOTOMY Left 01/15/2013   Procedure:  WEIL OSTEOTOMY AND DORSAL CAPSULOTOMY;  Surgeon: Toni Arthurs, MD;  Location: [facility] OR;  Service: Orthopedics;  Laterality: Left;    OB History    No data available       Home Medications    Prior to Admission medications   Medication Sig Start Date End Date Taking? Authorizing Provider  albuterol (PROVENTIL HFA;VENTOLIN HFA) 108 (90 Base) MCG/ACT inhaler Inhale 2 puffs into the lungs every 6 (six) hours as needed for wheezing or shortness of breath. 08/18/15  Yes Coralyn Helling, MD  amoxicillin (AMOXIL) 250 MG/5ML suspension Take 500 mg by mouth every 12 (twelve) hours. FOR 7 DAYS   Yes [provider]  aspirin (ASPIR-81) 81 MG EC tablet Take 81 mg by mouth daily.     Yes [provider]  beclomethasone (QVAR) 40 MCG/ACT inhaler Inhale 2 puffs into the lungs 2 (two) times daily. 02/18/15  Yes Saguier, Ramon Dredge, PA-C  bifidobacterium infantis (ALIGN) capsule Take 1 capsule by mouth daily.   Yes [provider]  diphenhydrAMINE (BENADRYL) 25 mg capsule Take 25 mg by mouth every 6 (six) hours as needed for itching or allergies.   Yes [provider]   doxycycline (VIBRAMYCIN) 100 MG capsule Take 100 mg by mouth 2 (two) times daily. 12/14/16  Yes [provider]  furosemide (LASIX) 20 MG tablet Take 1 tablet (20 mg total) by mouth daily as needed for fluid or edema. 07/30/16  Yes Maxie Barb, MD  isosorbide mononitrate (IMDUR) 30 MG 24 hr tablet TAKE 1 & 1/2 (ONE & ONE-HALF) TABLETS BY MOUTH ONCE DAILY IN THE MORNING 11/13/16  Yes Seabron Spates R, DO  levothyroxine (SYNTHROID, LEVOTHROID) 100 MCG tablet TAKE 1 TABLET BY MOUTH IN THE MORNING BEFORE BREAKFAST 10/30/16  Yes Lowne Chase, Yvonne R, DO  Liniments (SALONPAS PAIN RELIEF PATCH EX) Apply 1 patch topically daily as needed (pain).    Yes [provider]  losartan (COZAAR) 50 MG tablet TAKE 1 TABLET BY MOUTH ONCE  DAILY  YOU NEED APPOINTMENT FOR MORE REFILLS 09/28/16  Yes Seabron Spates R, DO  metoprolol succinate (TOPROL-XL) 50 MG 24 hr tablet TAKE ONE TABLET BY MOUTH TWICE DAILY WITH  OR  IMMEDIATELY  FOLLOWING  A  MEAL 09/17/16  Yes Seabron Spates R, DO  nitroGLYCERIN (NITROSTAT) 0.4 MG SL tablet Place 1 tablet (0.4 mg total) under the tongue every 5 (five) minutes as needed. At the onset of chest pain, Up to 3 doses 01/31/16  Yes Duke Salvia, MD  NON FORMULARY C-Pap machine, uses nightly for sleep apnea   Yes [provider]  pravastatin (PRAVACHOL) 20 MG tablet TAKE 1 TABLET BY MOUTH ONCE DAILY 09/17/16  Yes Seabron Spates R, DO  Psyllium (METAFIBER) 48.57 % POWD Take 10 mLs by mouth every evening. Mixed with water   Yes [provider]  traMADol (ULTRAM) 50 MG tablet Take 50 mg by mouth every 12 (twelve) hours as needed for moderate pain.   Yes [provider]    Family History Family History  Problem Relation Age of Onset  . Heart disease Mother   . Heart disease Father   . Colon polyps Sister   . Heart disease Brother   . Heart disease Sister   . Prostate cancer Brother   . Diabetes Daughter   . Diabetes  Unknown   . Breast cancer Daughter   . Alcohol abuse Son   . Brain cancer Son   . Lung cancer Son   . Colon cancer Neg Hx     Social History Social History   Tobacco Use  . Smoking status: Never Smoker  . Smokeless tobacco: Never Used  Substance Use Topics  . Alcohol use: No    Alcohol/week: 0.0 oz  . Drug use: No     Allergies   Sulfonamide derivatives   Review of Systems Review of Systems  Respiratory: Negative for shortness of breath.   Cardiovascular: Negative for chest pain.  Gastrointestinal: Negative for abdominal pain.  Neurological: Negative for headaches.     Physical Exam Updated Vital Signs BP (!) 101/44   Pulse 74   Temp 98.3 F (36.8 C) (Oral)   Resp 14   Wt 56.7 kg (125 lb 0 oz)   SpO2 95%   BMI 26.13 kg/m   Physical Exam  Constitutional: She is oriented to person, place, and time. She appears well-developed and well-nourished. No distress.  HENT:  Head: Normocephalic and atraumatic.  Eyes: EOM are normal. Pupils are equal, round, and reactive to light.  Neck: Normal range of motion. Neck supple.  Cardiovascular: Normal rate and regular rhythm. Exam reveals no gallop and no friction rub.  No murmur heard. Pulmonary/Chest: Effort normal. She has no wheezes. She has no rales.  Abdominal: Soft. She exhibits no distension. There is no tenderness.  Musculoskeletal: She exhibits no edema or tenderness.  Neurological: She is alert and oriented to person, place, and time.  Skin: Skin is warm and dry. She is not diaphoretic.  Psychiatric: She has a normal mood and affect. Her behavior is normal.  Nursing note and vitals reviewed.    ED Treatments / Results  Labs (all labs ordered are listed, but only abnormal results are displayed) Labs Reviewed  CBC WITH DIFFERENTIAL/PLATELET - Abnormal; Notable for the following components:      Result Value   RBC 3.82 (*)    Hemoglobin 11.7 (*)    HCT 33.7 (*)    Neutro Abs 8.1 (*)  Monocytes  Absolute 1.2 (*)    All other components within normal limits  COMPREHENSIVE METABOLIC PANEL - Abnormal; Notable for the following components:   Sodium 127 (*)    Chloride 93 (*)    Glucose, Bld 127 (*)    BUN 72 (*)    Creatinine, Ser 1.60 (*)    Total Protein 6.4 (*)    Albumin 2.9 (*)    GFR calc non Af Amer 27 (*)    GFR calc Af Amer 31 (*)    All other components within normal limits  URINALYSIS, ROUTINE W REFLEX MICROSCOPIC - Abnormal; Notable for the following components:   Leukocytes, UA TRACE (*)    Squamous Epithelial / LPF 0-5 (*)    All other components within normal limits  BASIC METABOLIC PANEL - Abnormal; Notable for the following components:   Sodium 130 (*)    CO2 21 (*)    Glucose, Bld 109 (*)    BUN 53 (*)    Creatinine, Ser 1.32 (*)    Calcium 8.3 (*)    GFR calc non Af Amer 34 (*)    GFR calc Af Amer 39 (*)    All other components within normal limits  CBC - Abnormal; Notable for the following components:   RBC 3.38 (*)    Hemoglobin 10.4 (*)    HCT 30.1 (*)    All other components within normal limits  TSH - Abnormal; Notable for the following components:   TSH 5.483 (*)    All other components within normal limits  I-STAT CHEM 8, ED - Abnormal; Notable for the following components:   Sodium 128 (*)    Chloride 94 (*)    BUN 61 (*)    Creatinine, Ser 1.60 (*)    Glucose, Bld 122 (*)    All other components within normal limits  MRSA PCR SCREENING  NA AND K (SODIUM & POTASSIUM), RAND UR  OSMOLALITY, URINE  OCCULT BLOOD X 1 CARD TO LAB, STOOL  I-STAT TROPONIN, ED    EKG  EKG Interpretation  Date/Time:  Thursday December 20 2016 13:28:38 EST Ventricular Rate:  139 PR Interval:    QRS Duration: 146 QT Interval:  358 QTC Calculation: 545 R Axis:   -14 Text Interpretation:  Wide-QRS tachycardia Left bundle branch block Otherwise no significant change Confirmed by Melene Plan [phone]) on 12/20/2016 1:51:46 PM       Radiology Dg Chest 2  View  Result Date: 12/20/2016 CLINICAL DATA:  Congestive heart failure. EXAM: CHEST  2 VIEW COMPARISON:  Radiographs of July 27, 2016. FINDINGS: Stable cardiomediastinal silhouette. Atherosclerosis of thoracic aorta is noted. Stable elevated right hemidiaphragm is noted with mild right basilar subsegmental atelectasis. Stable large hiatal hernia is noted. Left-sided pacemaker is unchanged in position. Bony thorax is unremarkable. IMPRESSION: Aortic atherosclerosis. Stable elevated right hemidiaphragm with mild right basilar subsegmental atelectasis. Stable large hiatal hernia. Electronically Signed   By: Lupita Raider, M.D.   On: 12/20/2016 16:35   Dg Lumbar Spine Complete  Result Date: 12/20/2016 CLINICAL DATA:  Acute on chronic low back pain after fall several weeks ago. EXAM: LUMBAR SPINE - COMPLETE 4+ VIEW COMPARISON:  CT scan of December 05, 2016. Radiographs of July 28, 2016. FINDINGS: Severe levoscoliosis of lumbar spine is noted. Atherosclerosis of thoracic aorta is noted. No fracture or spondylolisthesis is noted. Severe degenerative disc disease is noted at all levels of the lumbar spine. IMPRESSION: Severe multilevel degenerative disc disease. Severe levoscoliosis  of lumbar spine. No acute abnormality is noted. Electronically Signed   By: Lupita Raider, M.D.   On: 12/20/2016 16:44   Ct Head Wo Contrast  Result Date: 12/20/2016 CLINICAL DATA:  Altered level of consciousness. EXAM: CT HEAD WITHOUT CONTRAST TECHNIQUE: Contiguous axial images were obtained from the base of the skull through the vertex without intravenous contrast. COMPARISON:  CT scan of September 27, 2015. FINDINGS: Brain: Mild diffuse cortical atrophy is noted. Mild chronic ischemic white matter disease is noted. No mass effect or midline shift is noted. Ventricular size is within normal limits. There is no evidence of mass lesion, hemorrhage or acute infarction. Vascular: No hyperdense vessel or unexpected calcification.  Skull: Normal. Negative for fracture or focal lesion. Sinuses/Orbits: No acute finding. Other: None. IMPRESSION: Mild diffuse cortical atrophy. Mild chronic ischemic white matter disease. No acute intracranial abnormality seen. Electronically Signed   By: Lupita Raider, M.D.   On: 12/20/2016 14:51   Dg Hip Unilat W Or Wo Pelvis 2-3 Views Right  Result Date: 12/20/2016 CLINICAL DATA:  Patient fell several weeks ago with worsening right hip pain. EXAM: DG HIP (WITH OR WITHOUT PELVIS) 2-3V RIGHT COMPARISON:  07/28/2016 FINDINGS: Chronic stable joint space narrowing of both hips without fracture or joint dislocation. The pubic rami appear intact. Phleboliths are noted within the pelvis. Tubing projects over the pubic symphysis. Levoconvex curvature of the visualized lower lumbar spine as before. The bony pelvis appears intact. Obturator rings appear intact. IMPRESSION: 1. Uniform bilateral joint space narrowing of the hips without acute osseous abnormality. 2. Levoscoliosis of the lower lumbar spine. Electronically Signed   By: Tollie Eth M.D.   On: 12/20/2016 16:45    Procedures Procedures (including critical care time)  Medications Ordered in ED Medications  diltiazem (CARDIZEM) 1 mg/mL load via infusion 5 mg (5 mg Intravenous Bolus from Bag 12/20/16 1539)    And  diltiazem (CARDIZEM) 100 mg in dextrose 5% (1 mg/mL) infusion (5 mg/hr Intravenous Rate/Dose Verify 12/21/16 0600)  amoxicillin (AMOXIL) 250 MG/5ML suspension 500 mg (500 mg Oral Given 12/20/16 2235)  aspirin EC tablet 81 mg (not administered)  acidophilus (RISAQUAD) capsule 1 capsule (1 capsule Oral Given 12/20/16 2234)  diphenhydrAMINE (BENADRYL) capsule 25 mg (not administered)  levothyroxine (SYNTHROID, LEVOTHROID) tablet 100 mcg (100 mcg Oral Given 12/21/16 0834)  pravastatin (PRAVACHOL) tablet 20 mg (not administered)  traMADol (ULTRAM) tablet 50 mg (not administered)  enoxaparin (LOVENOX) injection 30 mg (30 mg  Subcutaneous Given 12/20/16 2235)  acetaminophen (TYLENOL) tablet 650 mg (not administered)    Or  acetaminophen (TYLENOL) suppository 650 mg (not administered)  polyethylene glycol (MIRALAX / GLYCOLAX) packet 17 g (17 g Oral Given 12/20/16 2039)  ondansetron (ZOFRAN) tablet 4 mg (not administered)    Or  ondansetron (ZOFRAN) injection 4 mg (not administered)  0.9 %  sodium chloride infusion ( Intravenous Rate/Dose Verify 12/21/16 0500)  oxyCODONE (Oxy IR/ROXICODONE) immediate release tablet 5 mg (5 mg Oral Given 12/21/16 0634)  budesonide (PULMICORT) nebulizer solution 0.25 mg (0.25 mg Nebulization Given 12/20/16 2148)  sodium chloride 0.9 % bolus 1,000 mL (0 mLs Intravenous Stopped 12/20/16 1532)  acetaminophen (TYLENOL) tablet 1,000 mg (1,000 mg Oral Given 12/20/16 1404)  lip balm (CARMEX) ointment (1 application  Given 12/20/16 2158)     Initial Impression / Assessment and Plan / ED Course  I have reviewed the triage vital signs and the nursing notes.  Pertinent labs & imaging results that were available during my  care of the patient were reviewed by me and considered in my medical decision making (see chart for details).     83 yo F with a chief complaint of a progressive decline over the past month or so.  She is complaining of some right-sided low back pain as well as epigastric pain.  She currently does not have the symptoms.  I will give a bolus of fluids.  Check a troponin, metabolic panel.  Obtain plain films of the chest and pelvis.  CT of the head.  Reassess.  I discussed the patient's case with Dr. Delton SeeNelson, cardiology.  Clinically the patient seemed to be in atrial flutter.  She felt okay to the low-dose diltiazem.  With the hyponatremia she suspects some other etiology other than primary cardiac and recommended hospitalist admission.  Interrogation report with the patient in atrial fibrillation since the eighth. HR into the 140's.    Improvement with cardizem, now into the  80s.  Will trial off gtt.   CRITICAL CARE Performed by: Rae Roamaniel Patrick Tamikka Pilger   Total critical care time: 35 minutes  Critical care time was exclusive of separately billable procedures and treating other patients.  Critical care was necessary to treat or prevent imminent or life-threatening deterioration.  Critical care was time spent personally by me on the following activities: development of treatment plan with patient and/or surrogate as well as nursing, discussions with consultants, evaluation of patient's response to treatment, examination of patient, obtaining history from patient or surrogate, ordering and performing treatments and interventions, ordering and review of laboratory studies, ordering and review of radiographic studies, pulse oximetry and re-evaluation of patient's condition.  The patients results and plan were reviewed and discussed.   Any x-rays performed were independently reviewed by myself.   Differential diagnosis were considered with the presenting HPI.  Medications  diltiazem (CARDIZEM) 1 mg/mL load via infusion 5 mg (5 mg Intravenous Bolus from Bag 12/20/16 1539)    And  diltiazem (CARDIZEM) 100 mg in dextrose 5% 100mL (1 mg/mL) infusion (5 mg/hr Intravenous Rate/Dose Verify 12/21/16 0600)  amoxicillin (AMOXIL) 250 MG/5ML suspension 500 mg (500 mg Oral Given 12/20/16 2235)  aspirin EC tablet 81 mg (not administered)  acidophilus (RISAQUAD) capsule 1 capsule (1 capsule Oral Given 12/20/16 2234)  diphenhydrAMINE (BENADRYL) capsule 25 mg (not administered)  levothyroxine (SYNTHROID, LEVOTHROID) tablet 100 mcg (100 mcg Oral Given 12/21/16 0834)  pravastatin (PRAVACHOL) tablet 20 mg (not administered)  traMADol (ULTRAM) tablet 50 mg (not administered)  enoxaparin (LOVENOX) injection 30 mg (30 mg Subcutaneous Given 12/20/16 2235)  acetaminophen (TYLENOL) tablet 650 mg (not administered)    Or  acetaminophen (TYLENOL) suppository 650 mg (not administered)    polyethylene glycol (MIRALAX / GLYCOLAX) packet 17 g (17 g Oral Given 12/20/16 2039)  ondansetron (ZOFRAN) tablet 4 mg (not administered)    Or  ondansetron (ZOFRAN) injection 4 mg (not administered)  0.9 %  sodium chloride infusion ( Intravenous Rate/Dose Verify 12/21/16 0500)  oxyCODONE (Oxy IR/ROXICODONE) immediate release tablet 5 mg (5 mg Oral Given 12/21/16 0634)  budesonide (PULMICORT) nebulizer solution 0.25 mg (0.25 mg Nebulization Given 12/20/16 2148)  sodium chloride 0.9 % bolus 1,000 mL (0 mLs Intravenous Stopped 12/20/16 1532)  acetaminophen (TYLENOL) tablet 1,000 mg (1,000 mg Oral Given 12/20/16 1404)  lip balm (CARMEX) ointment (1 application  Given 12/20/16 2158)    Vitals:   12/21/16 0400 12/21/16 0500 12/21/16 0600 12/21/16 0758  BP: (!) 97/45 (!) 124/57 (!) 101/44   Pulse: 75 95  74   Resp: 14 20 14    Temp: 97.6 F (36.4 C)   98.3 F (36.8 C)  TempSrc: Axillary   Oral  SpO2: 94% 94% 95%   Weight: 56.7 kg (125 lb 0 oz)       Final diagnoses:  Atrial flutter with rapid ventricular response (HCC)  Weakness  Dehydration    Admission/ observation were discussed with the admitting physician, patient and/or family and they are comfortable with the plan.    Final Clinical Impressions(s) / ED Diagnoses   Final diagnoses:  Atrial flutter with rapid ventricular response (HCC)  Weakness  Dehydration    ED Discharge Orders    None       Melene PlanFloyd, Cloud Graham, DO 12/21/16 [id number]

## 2016-12-20 NOTE — ED Notes (Signed)
ED TO INPATIENT HANDOFF REPORT  Name/Age/Gender [name] 83 y.o. female  Code Status    Code Status Orders  (From admission, onward)        Start     Ordered   12/20/16 1728  Do not attempt resuscitation (DNR)  Continuous    Question Answer Comment  In the event of cardiac or respiratory ARREST Do not call a "code blue"   In the event of cardiac or respiratory ARREST Do not perform Intubation, CPR, defibrillation or ACLS   In the event of cardiac or respiratory ARREST Use medication by any route, position, wound care, and other measures to relive pain and suffering. May use oxygen, suction and manual treatment of airway obstruction as needed for comfort.      12/20/16 1729    Code Status History    Date Active Date Inactive Code Status Order ID Comments User Context   07/28/2016 00:36 07/30/2016 15:03 DNR [id number]  Sid Falcon, MD Inpatient   04/10/2016 02:13 04/11/2016 16:25 DNR [id number]  Rise Patience, MD Inpatient   12/05/2015 14:05 12/05/2015 18:29 Full Code [id number]  Deboraha Sprang, MD Inpatient    Advance Directive Documentation     Most Recent Value  Type of Advance Directive  Healthcare Power of Attorney, Living will  Pre-existing out of facility DNR order (yellow form or pink MOST form)  No data  "MOST" Form in Place?  No data      Home/SNF/Other Home  Chief Complaint bladder infection  [name] of Care/Admitting Diagnosis ED Disposition    ED Disposition Condition Comment   Admit  Hospital Area: [facility] [[id number]]  [name] of Care: Stepdown [14]  Admit to SDU based on following criteria: Cardiac Instability:  Patients experiencing chest pain, unconfirmed MI and stable, arrhythmias and CHF requiring medical management and potentially compromising patient's stability  Diagnosis: Tachycardia [322025]  Admitting Physician: Rosita Fire [[id number]]  Attending Physician: Rosita Fire [[id number]]  Estimated length  of stay: past midnight tomorrow  Certification:: I certify this patient will need inpatient services for at least 2 midnights  PT Class (Do Not Modify): Inpatient [101]  PT Acc Code (Do Not Modify): Private [1]       Medical History Past Medical History:  Diagnosis Date  . Anxiety   . Arthritis   . Automatic implantable cardioverter/defibrillator (AICD) activation    s/p implantable cardioverter-defibrillator bivenrticular pacer. 12/05. [employer]. remote-no.  [name] CAD (coronary artery disease)    a. s/p prior anterior wall myocardial infarction and multiple percutaneous coronary intervention.;  b. [place] MV 1/14:  low risk, inf and apical defect likely due to atten artifact, no ischemia, EF 35% (visually normal and est 55-60%)  . Chest pain 12/09   recent hospitalization for it. felt to be noncardiac.   [name] CHF (congestive heart failure) ([place])   . DD (diverticular disease)    Hx of it - severe. Left colon 2004  . Esophageal stricture    last dialated 2007  . GERD (gastroesophageal reflux disease)   . Hiatal hernia   . HTN (hypertension)   . Hyperlipidemia   . Hypothyroidism   . Ischemic cardiomyopathy    EF 25-30% improved to 45% with biventricular pacing.   [name] PVT (paroxysmal ventricular tachycardia) ([place])   . Sleep apnea    has a Cpap machine  . Systolic heart failure    improved to class I to II   . UTI (lower urinary  tract infection)    recurrent. (Klebsiella pneumoniae). Last Cx 02/03/09. (R-ancef, nitrofurantoin, Augmentin, Zyosyn)    Allergies Allergies  Allergen Reactions  . Sulfonamide Derivatives Itching and Rash    IV Location/Drains/Wounds Patient Lines/Drains/Airways Status   Active Line/Drains/Airways    Name:   Placement date:   Placement time:   Site:   Days:   Peripheral IV 12/20/16 Right Forearm   12/20/16    1316    Forearm   less than 1   Incision 07/17/12 Foot Right   07/17/12    1226     1617   Incision 01/15/13 Foot Left   01/15/13    1133      1435          Labs/Imaging Results for orders placed or performed during the hospital encounter of 12/20/16 (from the past 48 hour(s))  CBC with Differential     Status: Abnormal   Collection Time: 12/20/16 12:25 PM  Result Value Ref Range   WBC 10.0 4.0 - 10.5 K/uL   RBC 3.82 (L) 3.87 - 5.11 MIL/uL   Hemoglobin 11.7 (L) 12.0 - 15.0 g/dL   HCT 33.7 (L) 36.0 - 46.0 %   MCV 88.2 78.0 - 100.0 fL   MCH 30.6 26.0 - 34.0 pg   MCHC 34.7 30.0 - 36.0 g/dL   RDW 13.1 11.5 - 15.5 %   Platelets 225 150 - 400 K/uL   Neutrophils Relative % 80 %   Neutro Abs 8.1 (H) 1.7 - 7.7 K/uL   Lymphocytes Relative 7 %   Lymphs Abs 0.7 0.7 - 4.0 K/uL   Monocytes Relative 12 %   Monocytes Absolute 1.2 (H) 0.1 - 1.0 K/uL   Eosinophils Relative 1 %   Eosinophils Absolute 0.1 0.0 - 0.7 K/uL   Basophils Relative 0 %   Basophils Absolute 0.0 0.0 - 0.1 K/uL  Comprehensive metabolic panel     Status: Abnormal   Collection Time: 12/20/16 12:25 PM  Result Value Ref Range   Sodium 127 (L) 135 - 145 mmol/L   Potassium 5.0 3.5 - 5.1 mmol/L   Chloride 93 (L) 101 - 111 mmol/L   CO2 25 22 - 32 mmol/L   Glucose, Bld 127 (H) 65 - 99 mg/dL   BUN 72 (H) 6 - 20 mg/dL   Creatinine, Ser 1.60 (H) 0.44 - 1.00 mg/dL   Calcium 9.4 8.9 - 10.3 mg/dL   Total Protein 6.4 (L) 6.5 - 8.1 g/dL   Albumin 2.9 (L) 3.5 - 5.0 g/dL   AST 18 15 - 41 U/L   ALT 14 14 - 54 U/L   Alkaline Phosphatase 64 38 - 126 U/L   Total Bilirubin 0.7 0.3 - 1.2 mg/dL   GFR calc non Af Amer 27 (L) >60 mL/min   GFR calc Af Amer 31 (L) >60 mL/min    Comment: (NOTE) The eGFR has been calculated using the CKD EPI equation. This calculation has not been validated in all clinical situations. eGFR's persistently <60 mL/min signify possible Chronic Kidney Disease.    Anion gap 9 5 - 15  Urinalysis, Routine w reflex microscopic     Status: Abnormal   Collection Time: 12/20/16 12:25 PM  Result Value Ref Range   Color, Urine YELLOW YELLOW   APPearance  CLEAR CLEAR   Specific Gravity, Urine 1.013 1.005 - 1.030   pH 5.0 5.0 - 8.0   Glucose, UA NEGATIVE NEGATIVE mg/dL   Hgb urine dipstick NEGATIVE NEGATIVE  Bilirubin Urine NEGATIVE NEGATIVE   Ketones, ur NEGATIVE NEGATIVE mg/dL   Protein, ur NEGATIVE NEGATIVE mg/dL   Nitrite NEGATIVE NEGATIVE   Leukocytes, UA TRACE (A) NEGATIVE   RBC / HPF 0-5 0 - 5 RBC/hpf   WBC, UA 0-5 0 - 5 WBC/hpf   Bacteria, UA NONE SEEN NONE SEEN   Squamous Epithelial / LPF 0-5 (A) NONE SEEN   Hyaline Casts, UA PRESENT   I-stat troponin, ED     Status: None   Collection Time: 12/20/16  1:11 PM  Result Value Ref Range   Troponin i, poc 0.01 0.00 - 0.08 ng/mL   Comment 3            Comment: Due to the release kinetics of cTnI, a negative result within the first hours of the onset of symptoms does not rule out myocardial infarction with certainty. If myocardial infarction is still suspected, repeat the test at appropriate intervals.   I-Stat Chem 8, ED     Status: Abnormal   Collection Time: 12/20/16  1:18 PM  Result Value Ref Range   Sodium 128 (L) 135 - 145 mmol/L   Potassium 4.8 3.5 - 5.1 mmol/L   Chloride 94 (L) 101 - 111 mmol/L   BUN 61 (H) 6 - 20 mg/dL   Creatinine, Ser 1.60 (H) 0.44 - 1.00 mg/dL   Glucose, Bld 122 (H) 65 - 99 mg/dL   Calcium, Ion 1.21 1.15 - 1.40 mmol/L   TCO2 26 22 - 32 mmol/L   Hemoglobin 12.6 12.0 - 15.0 g/dL   HCT 37.0 36.0 - 46.0 %   Dg Chest 2 View  Result Date: 12/20/2016 CLINICAL DATA:  Congestive heart failure. EXAM: CHEST  2 VIEW COMPARISON:  Radiographs of July 27, 2016. FINDINGS: Stable cardiomediastinal silhouette. Atherosclerosis of thoracic aorta is noted. Stable elevated right hemidiaphragm is noted with mild right basilar subsegmental atelectasis. Stable large hiatal hernia is noted. Left-sided pacemaker is unchanged in position. Bony thorax is unremarkable. IMPRESSION: Aortic atherosclerosis. Stable elevated right hemidiaphragm with mild right basilar  subsegmental atelectasis. Stable large hiatal hernia. Electronically Signed   By: Marijo Conception, M.D.   On: 12/20/2016 16:35   Dg Lumbar Spine Complete  Result Date: 12/20/2016 CLINICAL DATA:  Acute on chronic low back pain after fall several weeks ago. EXAM: LUMBAR SPINE - COMPLETE 4+ VIEW COMPARISON:  CT scan of December 05, 2016. Radiographs of July 28, 2016. FINDINGS: Severe levoscoliosis of lumbar spine is noted. Atherosclerosis of thoracic aorta is noted. No fracture or spondylolisthesis is noted. Severe degenerative disc disease is noted at all levels of the lumbar spine. IMPRESSION: Severe multilevel degenerative disc disease. Severe levoscoliosis of lumbar spine. No acute abnormality is noted. Electronically Signed   By: Marijo Conception, M.D.   On: 12/20/2016 16:44   Ct Head Wo Contrast  Result Date: 12/20/2016 CLINICAL DATA:  Altered [name] of consciousness. EXAM: CT HEAD WITHOUT CONTRAST TECHNIQUE: Contiguous axial images were obtained from the base of the skull through the vertex without intravenous contrast. COMPARISON:  CT scan of September 27, 2015. FINDINGS: Brain: Mild diffuse cortical atrophy is noted. Mild chronic ischemic white matter disease is noted. No mass effect or midline shift is noted. Ventricular size is within normal limits. There is no evidence of mass lesion, hemorrhage or acute infarction. Vascular: No hyperdense vessel or unexpected calcification. Skull: Normal. Negative for fracture or focal lesion. Sinuses/Orbits: No acute finding. Other: None. IMPRESSION: Mild diffuse cortical atrophy. Mild chronic ischemic  white matter disease. No acute intracranial abnormality seen. Electronically Signed   By: Marijo Conception, M.D.   On: 12/20/2016 14:51   Dg Hip Unilat W Or Wo Pelvis 2-3 Views Right  Result Date: 12/20/2016 CLINICAL DATA:  Patient fell several weeks ago with worsening right hip pain. EXAM: DG HIP (WITH OR WITHOUT PELVIS) 2-3V RIGHT COMPARISON:  07/28/2016  FINDINGS: Chronic stable joint space narrowing of both hips without fracture or joint dislocation. The pubic rami appear intact. Phleboliths are noted within the pelvis. Tubing projects over the pubic symphysis. Levoconvex curvature of the visualized lower lumbar spine as before. The bony pelvis appears intact. Obturator rings appear intact. IMPRESSION: 1. Uniform bilateral joint space narrowing of the hips without acute osseous abnormality. 2. Levoscoliosis of the lower lumbar spine. Electronically Signed   By: Ashley Royalty M.D.   On: 12/20/2016 16:45    Pending Labs Unresulted Labs (From admission, onward)   Start     Ordered   12/27/16 0500  Creatinine, serum  (enoxaparin (LOVENOX)    CrCl < 30 ml/min)  Weekly,   R    Comments:  while on enoxaparin therapy.    12/20/16 1729   12/21/16 3903  Basic metabolic panel  Tomorrow morning,   R     12/20/16 1729   12/21/16 0500  CBC  Tomorrow morning,   R     12/20/16 1729   12/20/16 1732  TSH  Once,   R     12/20/16 1731   12/20/16 1732  Na and K (sodium & potassium), rand urine  Once,   R     12/20/16 1731   12/20/16 1732  Osmolality, urine  Once,   R     12/20/16 1731      Vitals/Pain Today's Vitals   12/20/16 1700 12/20/16 1715 12/20/16 1730 12/20/16 1800  BP: (!) 96/56 95/79 112/78 113/63  Pulse: 87 (!) 44 (!) 36 93  Resp: (!) 25 (!) 21 (!) 26 (!) 26  Temp:      TempSrc:      SpO2: 98% 96% 99% 100%    Isolation Precautions No active isolations  Medications Medications  diltiazem (CARDIZEM) 1 mg/mL load via infusion 5 mg (5 mg Intravenous Bolus from Bag 12/20/16 1539)    And  diltiazem (CARDIZEM) 100 mg in dextrose 5% 166m (1 mg/mL) infusion (0 mg/hr Intravenous Stopped 12/20/16 1713)  amoxicillin (AMOXIL) 250 MG/5ML suspension 500 mg (not administered)  aspirin EC tablet 81 mg (not administered)  beclomethasone (QVAR) 40 MCG/ACT inhaler 2 puff (not administered)  bifidobacterium infantis (ALIGN) capsule 1 capsule (not  administered)  diphenhydrAMINE (BENADRYL) capsule 25 mg (not administered)  levothyroxine (SYNTHROID, LEVOTHROID) tablet 100 mcg (not administered)  pravastatin (PRAVACHOL) tablet 20 mg (not administered)  traMADol (ULTRAM) tablet 50 mg (not administered)  enoxaparin (LOVENOX) injection 30 mg (not administered)  acetaminophen (TYLENOL) tablet 650 mg (not administered)    Or  acetaminophen (TYLENOL) suppository 650 mg (not administered)  polyethylene glycol (MIRALAX / GLYCOLAX) packet 17 g (not administered)  ondansetron (ZOFRAN) tablet 4 mg (not administered)    Or  ondansetron (ZOFRAN) injection 4 mg (not administered)  0.9 %  sodium chloride infusion (not administered)  oxyCODONE (Oxy IR/ROXICODONE) immediate release tablet 5 mg (not administered)  sodium chloride 0.9 % bolus 1,000 mL (0 mLs Intravenous Stopped 12/20/16 1532)  acetaminophen (TYLENOL) tablet 1,000 mg (1,000 mg Oral Given 12/20/16 1404)    Mobility walks with person assist

## 2016-12-20 NOTE — ED Triage Notes (Signed)
Patient was recently diagnosed with bladder infection and currently on Augmentin .  But patient having issues with fatigue, not eating, severe pain in right hip.

## 2016-12-20 NOTE — H&P (Addendum)
History and Physical    Tracy MontanaDorothy G Parish ZOX:[id number] DOB: 29-Jun-1921 DOA: 12/20/2016  PCP: Helane RimaWallace, Erica, DO  Patient coming from:Home  Chief Complaint: back pain, decreased oral intake and worsening right hip pain for few days to weeks.  HPI: [name] is a 83 y.o. female with medical history significant of anxiety, chronic systolic congestive heart failure with EF of 45-50%, esophageal stricture, GERD, hypertension, hypothyroidism, has pacemaker, recurrent urinary tract infection, nephrolithiasis follows up with urologist for the management of hydronephrosis and UTI, changed to amoxicillin yesterday depending on sensitivity as per patient's daughter presented with gradual decline in patient's clinical conditions including worsening back pain, hip pain, decreased oral intake.  Patient also had a fall with some bruises in the head.  Denied loss of consciousness. Denied fever, chills,  vomiting, chest pain, shortness of breath.  Reports nausea, anorexia associated with worsening pain.  Denies dysuria urgency or frequency.  No diarrhea or constipation.  ED Course: Patient was found to have atrial flutter with RVR and VT's.  Cardiology Dr. Delton SeeNelson was contacted and he started on diltiazem drip.  Patient with hyponatremia, worsening renal failure.  Admitted for further evaluation.  I have discussed with the patient and her granddaughter at bedside in ER.  I have known the patient and family from prior admission.  Review of Systems: As per HPI otherwise 10 point review of systems negative.    Past Medical History:  Diagnosis Date  . Anxiety   . Arthritis   . Automatic implantable cardioverter/defibrillator (AICD) activation    s/p implantable cardioverter-defibrillator bivenrticular pacer. 12/05. St, Jude Medtronictlas. remote-no.  [name]. CAD (coronary artery disease)    a. s/p prior anterior wall myocardial infarction and multiple percutaneous coronary intervention.;  b. Aden MV 1/14:  low  risk, inf and apical defect likely due to atten artifact, no ischemia, EF 35% (visually normal and est 55-60%)  . Chest pain 12/09   recent hospitalization for it. felt to be noncardiac.   [name]. CHF (congestive heart failure) (HCC)   . DD (diverticular disease)    Hx of it - severe. Left colon 2004  . Esophageal stricture    last dialated 2007  . GERD (gastroesophageal reflux disease)   . Hiatal hernia   . HTN (hypertension)   . Hyperlipidemia   . Hypothyroidism   . Ischemic cardiomyopathy    EF 25-30% improved to 45% with biventricular pacing.   [name]. PVT (paroxysmal ventricular tachycardia) (HCC)   . Sleep apnea    has a Cpap machine  . Systolic heart failure    improved to class I to II   . UTI (lower urinary tract infection)    recurrent. (Klebsiella pneumoniae). Last Cx 02/03/09. (R-ancef, nitrofurantoin, Augmentin, Zyosyn)    Past Surgical History:  Procedure Laterality Date  . ANGIOPLASTY     stent  . APPENDECTOMY    . CARDIAC CATHETERIZATION     stent  . CARDIAC DEFIBRILLATOR PLACEMENT     St Jude Atlas  . CATARACT EXTRACTION, BILATERAL    . COLONOSCOPY W/ POLYPECTOMY    . EP IMPLANTABLE DEVICE N/A 12/05/2015   Procedure: ICD Generator Changeout;  Surgeon: Duke SalviaSteven C Klein, MD;  [facility];  Service: Cardiovascular;  Laterality: N/A;  . HAMMER TOE SURGERY Left 01/15/2013   Procedure: LEFT SECOND THROUGH FOURTH HAMMERTOE CORRECTION ;  Surgeon: Toni ArthursJohn Hewitt, MD;  Location: [facility] OR;  Service: Orthopedics;  Laterality: Left;  . HAMMERTOE RECONSTRUCTION WITH WEIL OSTEOTOMY Right 07/17/2012  Procedure: RIGHT SECOND AND THIRD MT WEIL OSTEOTOMIES AND SECOND AND THIRD HAMMERTOE CORRECTIONS;  Surgeon: Toni Arthurs, MD;  Location: [facility] OR;  Service: Orthopedics;  Laterality: Right;  . INSERT / REPLACE / REMOVE PACEMAKER    . TONSILLECTOMY    . TOTAL ABDOMINAL HYSTERECTOMY    . TOTAL KNEE ARTHROPLASTY Bilateral   . TUBAL LIGATION    . WEIL OSTEOTOMY Left 01/15/2013    Procedure:  WEIL OSTEOTOMY AND DORSAL CAPSULOTOMY;  Surgeon: Toni Arthurs, MD;  Location: [facility] OR;  Service: Orthopedics;  Laterality: Left;    Social history: Reports that  has never smoked. she has never used smokeless tobacco. She reports that she does not drink alcohol or use drugs.  Allergies  Allergen Reactions  . Sulfonamide Derivatives Itching and Rash    Family History  Problem Relation Age of Onset  . Heart disease Mother   . Heart disease Father   . Colon polyps Sister   . Heart disease Brother   . Heart disease Sister   . Prostate cancer Brother   . Diabetes Daughter   . Diabetes Unknown   . Breast cancer Daughter   . Alcohol abuse Son   . Brain cancer Son   . Lung cancer Son   . Colon cancer Neg Hx      Prior to Admission medications   Medication Sig Start Date End Date Taking? Authorizing Provider  albuterol (PROVENTIL HFA;VENTOLIN HFA) 108 (90 Base) MCG/ACT inhaler Inhale 2 puffs into the lungs every 6 (six) hours as needed for wheezing or shortness of breath. 08/18/15  Yes Coralyn Helling, MD  amoxicillin (AMOXIL) 250 MG/5ML suspension Take 500 mg by mouth every 12 (twelve) hours. FOR 7 DAYS   Yes [provider]  aspirin (ASPIR-81) 81 MG EC tablet Take 81 mg by mouth daily.     Yes [provider]  beclomethasone (QVAR) 40 MCG/ACT inhaler Inhale 2 puffs into the lungs 2 (two) times daily. 02/18/15  Yes Saguier, Ramon Dredge, PA-C  bifidobacterium infantis (ALIGN) capsule Take 1 capsule by mouth daily.   Yes [provider]  diphenhydrAMINE (BENADRYL) 25 mg capsule Take 25 mg by mouth every 6 (six) hours as needed for itching or allergies.   Yes [provider]  doxycycline (VIBRAMYCIN) 100 MG capsule Take 100 mg by mouth 2 (two) times daily. 12/14/16  Yes [provider]  furosemide (LASIX) 20 MG tablet Take 1 tablet (20 mg total) by mouth daily as needed for fluid or edema. 07/30/16  Yes Maxie Barb, MD  isosorbide  mononitrate (IMDUR) 30 MG 24 hr tablet TAKE 1 & 1/2 (ONE & ONE-HALF) TABLETS BY MOUTH ONCE DAILY IN THE MORNING 11/13/16  Yes Seabron Spates R, DO  levothyroxine (SYNTHROID, LEVOTHROID) 100 MCG tablet TAKE 1 TABLET BY MOUTH IN THE MORNING BEFORE BREAKFAST 10/30/16  Yes Lowne Chase, Yvonne R, DO  Liniments (SALONPAS PAIN RELIEF PATCH EX) Apply 1 patch topically daily as needed (pain).    Yes [provider]  losartan (COZAAR) 50 MG tablet TAKE 1 TABLET BY MOUTH ONCE DAILY YOU NEED APPOINTMENT FOR MORE REFILLS 09/28/16  Yes Seabron Spates R, DO  metoprolol succinate (TOPROL-XL) 50 MG 24 hr tablet TAKE ONE TABLET BY MOUTH TWICE DAILY WITH  OR  IMMEDIATELY  FOLLOWING  A  MEAL 09/17/16  Yes Seabron Spates R, DO  nitroGLYCERIN (NITROSTAT) 0.4 MG SL tablet Place 1 tablet (0.4 mg total) under the tongue every 5 (five) minutes as needed.  At the onset of chest pain, Up to 3 doses 01/31/16  Yes Duke Salvia, MD  NON FORMULARY C-Pap machine, uses nightly for sleep apnea   Yes [provider]  pravastatin (PRAVACHOL) 20 MG tablet TAKE 1 TABLET BY MOUTH ONCE DAILY 09/17/16  Yes Seabron Spates R, DO  Psyllium (METAFIBER) 48.57 % POWD Take 10 mLs by mouth every evening. Mixed with water   Yes [provider]  traMADol (ULTRAM) 50 MG tablet Take 50 mg by mouth every 12 (twelve) hours as needed for moderate pain.   Yes [provider]    Physical Exam: Vitals:   12/20/16 1209 12/20/16 1539 12/20/16 1700  BP: (!) 105/59 101/60 (!) 96/56  Pulse: (!) 128 (!) 120 87  Resp: 18 (!) 23 (!) 25  Temp: 97.6 F (36.4 C)    TempSrc: Oral    SpO2: 97% 97% 98%      Constitutional: Pleasant elderly female lying in bed comfortable, not in distress Vitals:   12/20/16 1209 12/20/16 1539 12/20/16 1700  BP: (!) 105/59 101/60 (!) 96/56  Pulse: (!) 128 (!) 120 87  Resp: 18 (!) 23 (!) 25  Temp: 97.6 F (36.4 C)    TempSrc: Oral    SpO2: 97% 97% 98%   Eyes: PERRL,  lids and conjunctivae normal ENMT: Mucous membranes are dry Neck: normal, supple Respiratory: clear to auscultation bilaterally, no wheezing, no crackles. Normal respiratory effort. No accessory muscle use.  Cardiovascular: S1-S2 with regular rate rhythm, no murmur appreciated.  Trace lower extremities edema. Abdomen: Soft, Non-tender, non-distended. Bowel sounds positive.  Musculoskeletal: no clubbing / cyanosis.  Skin: no rashes, lesions, ulcers. No induration Neurologic: Alert awake and following commands. Psychiatric: Normal judgment and insight appropriate..    Labs on Admission: I have personally reviewed following labs and imaging studies  CBC: Recent Labs  Lab 12/20/16 1225 12/20/16 1318  WBC 10.0  --   NEUTROABS 8.1*  --   HGB 11.7* 12.6  HCT 33.7* 37.0  MCV 88.2  --   PLT 225  --    Basic Metabolic Panel: Recent Labs  Lab 12/20/16 1225 12/20/16 1318  NA 127* 128*  K 5.0 4.8  CL 93* 94*  CO2 25  --   GLUCOSE 127* 122*  BUN 72* 61*  CREATININE 1.60* 1.60*  CALCIUM 9.4  --    GFR: CrCl cannot be calculated (Unknown ideal weight.). Liver Function Tests: Recent Labs  Lab 12/20/16 1225  AST 18  ALT 14  ALKPHOS 64  BILITOT 0.7  PROT 6.4*  ALBUMIN 2.9*   No results for input(s): LIPASE, AMYLASE in the last 168 hours. No results for input(s): AMMONIA in the last 168 hours. Coagulation Profile: No results for input(s): INR, PROTIME in the last 168 hours. Cardiac Enzymes: No results for input(s): CKTOTAL, CKMB, CKMBINDEX, TROPONINI in the last 168 hours. BNP (last 3 results) No results for input(s): PROBNP in the last 8760 hours. HbA1C: No results for input(s): HGBA1C in the last 72 hours. CBG: No results for input(s): GLUCAP in the last 168 hours. Lipid Profile: No results for input(s): CHOL, HDL, LDLCALC, TRIG, CHOLHDL, LDLDIRECT in the last 72 hours. Thyroid Function Tests: No results for input(s): TSH, T4TOTAL, FREET4, T3FREE, THYROIDAB in the  last 72 hours. Anemia Panel: No results for input(s): VITAMINB12, FOLATE, FERRITIN, TIBC, IRON, RETICCTPCT in the last 72 hours. Urine analysis:    Component Value Date/Time   COLORURINE YELLOW 12/20/2016 1225   APPEARANCEUR CLEAR  12/20/2016 1225   LABSPEC 1.013 12/20/2016 1225   PHURINE 5.0 12/20/2016 1225   GLUCOSEU NEGATIVE 12/20/2016 1225   HGBUR NEGATIVE 12/20/2016 1225   BILIRUBINUR NEGATIVE 12/20/2016 1225   BILIRUBINUR neg 08/06/2016 1240   KETONESUR NEGATIVE 12/20/2016 1225   PROTEINUR NEGATIVE 12/20/2016 1225   UROBILINOGEN 0.2 08/06/2016 1240   UROBILINOGEN 0.2 04/01/2010 1425   NITRITE NEGATIVE 12/20/2016 1225   LEUKOCYTESUR TRACE (A) 12/20/2016 1225   Sepsis Labs: !!!!!!!!!!!!!!!!!!!!!!!!!!!!!!!!!!!!!!!!!!!! @LABRCNTIP (procalcitonin:4,lacticidven:4) )No results found for this or any previous visit (from the past 240 hour(s)).   Radiological Exams on Admission: Dg Chest 2 View  Result Date: 12/20/2016 CLINICAL DATA:  Congestive heart failure. EXAM: CHEST  2 VIEW COMPARISON:  Radiographs of July 27, 2016. FINDINGS: Stable cardiomediastinal silhouette. Atherosclerosis of thoracic aorta is noted. Stable elevated right hemidiaphragm is noted with mild right basilar subsegmental atelectasis. Stable large hiatal hernia is noted. Left-sided pacemaker is unchanged in position. Bony thorax is unremarkable. IMPRESSION: Aortic atherosclerosis. Stable elevated right hemidiaphragm with mild right basilar subsegmental atelectasis. Stable large hiatal hernia. Electronically Signed   By: Lupita Raider, M.D.   On: 12/20/2016 16:35   Dg Lumbar Spine Complete  Result Date: 12/20/2016 CLINICAL DATA:  Acute on chronic low back pain after fall several weeks ago. EXAM: LUMBAR SPINE - COMPLETE 4+ VIEW COMPARISON:  CT scan of December 05, 2016. Radiographs of July 28, 2016. FINDINGS: Severe levoscoliosis of lumbar spine is noted. Atherosclerosis of thoracic aorta is noted. No fracture or  spondylolisthesis is noted. Severe degenerative disc disease is noted at all levels of the lumbar spine. IMPRESSION: Severe multilevel degenerative disc disease. Severe levoscoliosis of lumbar spine. No acute abnormality is noted. Electronically Signed   By: Lupita Raider, M.D.   On: 12/20/2016 16:44   Ct Head Wo Contrast  Result Date: 12/20/2016 CLINICAL DATA:  Altered level of consciousness. EXAM: CT HEAD WITHOUT CONTRAST TECHNIQUE: Contiguous axial images were obtained from the base of the skull through the vertex without intravenous contrast. COMPARISON:  CT scan of September 27, 2015. FINDINGS: Brain: Mild diffuse cortical atrophy is noted. Mild chronic ischemic white matter disease is noted. No mass effect or midline shift is noted. Ventricular size is within normal limits. There is no evidence of mass lesion, hemorrhage or acute infarction. Vascular: No hyperdense vessel or unexpected calcification. Skull: Normal. Negative for fracture or focal lesion. Sinuses/Orbits: No acute finding. Other: None. IMPRESSION: Mild diffuse cortical atrophy. Mild chronic ischemic white matter disease. No acute intracranial abnormality seen. Electronically Signed   By: Lupita Raider, M.D.   On: 12/20/2016 14:51   Dg Hip Unilat W Or Wo Pelvis 2-3 Views Right  Result Date: 12/20/2016 CLINICAL DATA:  Patient fell several weeks ago with worsening right hip pain. EXAM: DG HIP (WITH OR WITHOUT PELVIS) 2-3V RIGHT COMPARISON:  07/28/2016 FINDINGS: Chronic stable joint space narrowing of both hips without fracture or joint dislocation. The pubic rami appear intact. Phleboliths are noted within the pelvis. Tubing projects over the pubic symphysis. Levoconvex curvature of the visualized lower lumbar spine as before. The bony pelvis appears intact. Obturator rings appear intact. IMPRESSION: 1. Uniform bilateral joint space narrowing of the hips without acute osseous abnormality. 2. Levoscoliosis of the lower lumbar spine.  Electronically Signed   By: Tollie Eth M.D.   On: 12/20/2016 16:45    EKG: Independently reviewed.  Left bundle branch block and wide complex tachycardia.  It looks atrial flutter with RVR in the telemetry. Assessment/Plan  #  Paroxysmal ventricular tachycardia (HCC): Also looks like atrial flutter with RVR in the telemetry.  I will repeat EKG.  Cardiology was consulted by ER recommended diltiazem drip.  Admission for further evaluation. -Continue aspirin, check echocardiogram -Continue diltiazem drip in a stepdown with close monitoring of heart rate and blood pressure. -Patient is not a candidate for further anticoagulation.  Follow-up cardiologist for further evaluation. -The ICD was interrogated in the ER.  #  History of recurrent UTIs: Continue amoxicillin as started by urology clinic.  Unable to review the culture results in epic.  #Acute kidney injury, chronic hyponatremia: Patient looks intravascularly volume depletion.  Also has UTI and hydronephrosis follows up with urologist.  No intervention because of the age.  Continue antibiotics.  I would hold diuretics and is started gentle IV hydration.  Monitor BMP. -Check urine lites, osmolality. -TSH level.  #Hypothyroidism: Continue Synthroid.  Follow-up TSH level.  #Severe protein calorie malnutrition, physical deconditioning: Patient with a gradual decline in clinical and physical condition.  She has multiple  Comorbidities. -PT, OT evaluation, his manager referral.  Dietary referral  #Goals of care discussion: I have discussed goals of care with the patient and her granddaughter at bedside.  Patient likes to be DNR and no aggressive measures that does not help significantly.  Discussed about continuing medical treatment for next 1 or 2 days.  If no improvement may consider pain management and mostly comfort measures.  I will consult palliative care.  Patient and family agrees with the plan.  #Chronic systolic congestive heart failure:  Lasix on hold because of hypotension.  Follow-up echo.  #Chronic back pain and hip pain due to degenerative disease and multiple disc disease.  Continue pain management, PT OT evaluation and provide supportive care.  No acute finding or fracture in imaging studies.  DVT prophylaxis: Lovenox subcutaneous Code Status: DNR Family Communication: Discussed with the patient's granddaughter Disposition Plan: Stepdown unit because of requiring diltiazem drip Consults called: Cardiology was contacted by ER Admission status: Inpatient   Simon Llamas Jaynie CollinsPrasad Dujuan Stankowski MD [facility] Pager [phone]  If 7PM-7AM, please contact night-coverage [email or web address] Password [email or web address]  12/20/2016, 5:32 PM

## 2016-12-21 ENCOUNTER — Encounter (HOSPITAL_COMMUNITY): Payer: Self-pay | Admitting: Physician Assistant

## 2016-12-21 ENCOUNTER — Inpatient Hospital Stay (HOSPITAL_COMMUNITY): Payer: Medicare Other

## 2016-12-21 DIAGNOSIS — I4892 Unspecified atrial flutter: Secondary | ICD-10-CM

## 2016-12-21 DIAGNOSIS — Z87440 Personal history of urinary (tract) infections: Secondary | ICD-10-CM

## 2016-12-21 DIAGNOSIS — N179 Acute kidney failure, unspecified: Secondary | ICD-10-CM

## 2016-12-21 DIAGNOSIS — R634 Abnormal weight loss: Secondary | ICD-10-CM

## 2016-12-21 DIAGNOSIS — Z5309 Procedure and treatment not carried out because of other contraindication: Secondary | ICD-10-CM

## 2016-12-21 DIAGNOSIS — I2589 Other forms of chronic ischemic heart disease: Secondary | ICD-10-CM

## 2016-12-21 DIAGNOSIS — I361 Nonrheumatic tricuspid (valve) insufficiency: Secondary | ICD-10-CM

## 2016-12-21 DIAGNOSIS — R1310 Dysphagia, unspecified: Secondary | ICD-10-CM

## 2016-12-21 DIAGNOSIS — E871 Hypo-osmolality and hyponatremia: Secondary | ICD-10-CM

## 2016-12-21 DIAGNOSIS — I255 Ischemic cardiomyopathy: Secondary | ICD-10-CM

## 2016-12-21 DIAGNOSIS — D649 Anemia, unspecified: Secondary | ICD-10-CM

## 2016-12-21 DIAGNOSIS — E440 Moderate protein-calorie malnutrition: Secondary | ICD-10-CM

## 2016-12-21 DIAGNOSIS — I483 Typical atrial flutter: Principal | ICD-10-CM

## 2016-12-21 LAB — CBC
HEMATOCRIT: 30.1 % — AB (ref 36.0–46.0)
HEMOGLOBIN: 10.4 g/dL — AB (ref 12.0–15.0)
MCH: 30.8 pg (ref 26.0–34.0)
MCHC: 34.6 g/dL (ref 30.0–36.0)
MCV: 89.1 fL (ref 78.0–100.0)
Platelets: 221 10*3/uL (ref 150–400)
RBC: 3.38 MIL/uL — AB (ref 3.87–5.11)
RDW: 13.3 % (ref 11.5–15.5)
WBC: 9.4 10*3/uL (ref 4.0–10.5)

## 2016-12-21 LAB — TSH: TSH: 5.483 u[IU]/mL — ABNORMAL HIGH (ref 0.350–4.500)

## 2016-12-21 LAB — BASIC METABOLIC PANEL
ANION GAP: 6 (ref 5–15)
BUN: 53 mg/dL — ABNORMAL HIGH (ref 6–20)
CO2: 21 mmol/L — AB (ref 22–32)
Calcium: 8.3 mg/dL — ABNORMAL LOW (ref 8.9–10.3)
Chloride: 103 mmol/L (ref 101–111)
Creatinine, Ser: 1.32 mg/dL — ABNORMAL HIGH (ref 0.44–1.00)
GFR, EST AFRICAN AMERICAN: 39 mL/min — AB (ref >60)
GFR, EST NON AFRICAN AMERICAN: 34 mL/min — AB (ref >60)
Glucose, Bld: 109 mg/dL — ABNORMAL HIGH (ref 65–99)
POTASSIUM: 4.4 mmol/L (ref 3.5–5.1)
Sodium: 130 mmol/L — ABNORMAL LOW (ref 135–145)

## 2016-12-21 LAB — ECHOCARDIOGRAM COMPLETE: WEIGHTICAEL: 2000.01 [oz_av]

## 2016-12-21 MED ORDER — ENSURE ENLIVE PO LIQD
237.0000 mL | Freq: Two times a day (BID) | ORAL | Status: DC
  Administered 2016-12-21 – 2016-12-23 (×4): 237 mL via ORAL

## 2016-12-21 MED ORDER — METOPROLOL TARTRATE 25 MG PO TABS
50.0000 mg | ORAL_TABLET | Freq: Two times a day (BID) | ORAL | Status: DC
  Administered 2016-12-21 (×2): 50 mg via ORAL
  Filled 2016-12-21 (×2): qty 2

## 2016-12-21 MED ORDER — ALUM & MAG HYDROXIDE-SIMETH 200-200-20 MG/5ML PO SUSP: 15.0000 mL | Freq: Four times a day (QID) | ORAL | Status: DC | PRN

## 2016-12-21 MED ORDER — SODIUM CHLORIDE 0.9 % IV SOLN
INTRAVENOUS | Status: AC
  Administered 2016-12-21: 12:00:00 via INTRAVENOUS

## 2016-12-21 MED ORDER — RANITIDINE HCL 150 MG/10ML PO SYRP
150.0000 mg | ORAL_SOLUTION | Freq: Two times a day (BID) | ORAL | Status: DC
  Administered 2016-12-21 – 2016-12-27 (×11): 150 mg via ORAL
  Filled 2016-12-21 (×12): qty 10

## 2016-12-21 MED ORDER — LIDOCAINE 5 % EX PTCH
2.0000 | MEDICATED_PATCH | CUTANEOUS | Status: DC
  Administered 2016-12-21 – 2016-12-23 (×3): 2 via TRANSDERMAL
  Administered 2016-12-24: 1 via TRANSDERMAL
  Administered 2016-12-25 – 2016-12-27 (×3): 2 via TRANSDERMAL
  Filled 2016-12-21 (×9): qty 2

## 2016-12-21 MED ORDER — ACETAMINOPHEN 500 MG PO TABS
1000.0000 mg | ORAL_TABLET | Freq: Three times a day (TID) | ORAL | Status: DC
  Administered 2016-12-21 – 2016-12-27 (×19): 1000 mg via ORAL
  Filled 2016-12-21 (×20): qty 2

## 2016-12-21 NOTE — Progress Notes (Signed)
[facility] PROGRESS NOTE  Tracy MontanaDorothy G Passmore ZOX:[id number] DOB: 08-09-1921 DOA: 12/20/2016  PCP: Helane RimaWallace, Erica, DO  Brief History/Interval Summary: 83 year old Caucasian female with a past medical history of chronic systolic CHF with an ejection fraction of 45-50%, esophageal stricture, hypothyroidism, pacemaker, recurrent UTI, history of nephrolithiasis, history of hydronephrosis recently started on amoxicillin by her urologist has been having worsening back pain hip pain decreased oral intake.  On evaluation patient was found to have atrial flutter with RVR.  Patient was hospitalized for further management.  Reason for Visit: Atrial flutter with RVR  Consultants: Cardiology.  Gastroenterology.  Procedures:  Transthoracic echocardiogram is pending  Antibiotics: Amoxacillin  Subjective/Interval History: Patient states that she does not feel any worse compared to yesterday.  Denies any chest pain shortness of breath continues to have back pain.  ROS: Denies any nausea or vomiting  Objective:  Vital Signs  Vitals:   12/21/16 0500 12/21/16 0600 12/21/16 0758 12/21/16 0916  BP: (!) 124/57 (!) 101/44    Pulse: 95 74    Resp: 20 14    Temp:   98.3 F (36.8 C)   TempSrc:   Oral   SpO2: 94% 95%  96%  Weight:        Intake/Output Summary (Last 24 hours) at 12/21/2016 1052 Last data filed at 12/21/2016 0700 Gross per 24 hour  Intake 873.45 ml  Output 650 ml  Net 223.45 ml   Filed Weights   12/21/16 0400  Weight: 56.7 kg (125 lb 0 oz)    General appearance: alert, cooperative, appears stated age and no distress Head: Normocephalic, without obvious abnormality, atraumatic Resp: clear to auscultation bilaterally Cardio: S1-S2 is irregularly irregular.  No S3 or S4.  No rubs or bruit. Systolic murmur over precordium. GI: soft, non-tender; bowel sounds normal; no masses,  no organomegaly Extremities: extremities normal, atraumatic, no cyanosis or  edema Neurologic: No focal deficits  Lab Results:  Data Reviewed: I have personally reviewed following labs and imaging studies  CBC: Recent Labs  Lab 12/20/16 1225 12/20/16 1318 12/21/16 0451  WBC 10.0  --  9.4  NEUTROABS 8.1*  --   --   HGB 11.7* 12.6 10.4*  HCT 33.7* 37.0 30.1*  MCV 88.2  --  89.1  PLT 225  --  221    Basic Metabolic Panel: Recent Labs  Lab 12/20/16 1225 12/20/16 1318 12/21/16 0451  NA 127* 128* 130*  K 5.0 4.8 4.4  CL 93* 94* 103  CO2 25  --  21*  GLUCOSE 127* 122* 109*  BUN 72* 61* 53*  CREATININE 1.60* 1.60* 1.32*  CALCIUM 9.4  --  8.3*    GFR: Estimated Creatinine Clearance: 19.8 mL/min (A) (by C-G formula based on SCr of 1.32 mg/dL (H)).  Liver Function Tests: Recent Labs  Lab 12/20/16 1225  AST 18  ALT 14  ALKPHOS 64  BILITOT 0.7  PROT 6.4*  ALBUMIN 2.9*    Thyroid Function Tests: Recent Labs    12/21/16 0451  TSH 5.483*    Anemia Panel: No results for input(s): VITAMINB12, FOLATE, FERRITIN, TIBC, IRON, RETICCTPCT in the last 72 hours.  Recent Results (from the past 240 hour(s))  MRSA PCR Screening     Status: None   Collection Time: 12/20/16  6:55 PM  Result Value Ref Range Status   MRSA by PCR NEGATIVE NEGATIVE Final    Comment:        The GeneXpert MRSA Assay (FDA approved for NASAL specimens only),  is one component of a comprehensive MRSA colonization surveillance program. It is not intended to diagnose MRSA infection nor to guide or monitor treatment for MRSA infections.       Radiology Studies: Dg Chest 2 View  Result Date: 12/20/2016 CLINICAL DATA:  Congestive heart failure. EXAM: CHEST  2 VIEW COMPARISON:  Radiographs of July 27, 2016. FINDINGS: Stable cardiomediastinal silhouette. Atherosclerosis of thoracic aorta is noted. Stable elevated right hemidiaphragm is noted with mild right basilar subsegmental atelectasis. Stable large hiatal hernia is noted. Left-sided pacemaker is unchanged in  position. Bony thorax is unremarkable. IMPRESSION: Aortic atherosclerosis. Stable elevated right hemidiaphragm with mild right basilar subsegmental atelectasis. Stable large hiatal hernia. Electronically Signed   By: Lupita RaiderJames  Green Jr, M.D.   On: 12/20/2016 16:35   Dg Lumbar Spine Complete  Result Date: 12/20/2016 CLINICAL DATA:  Acute on chronic low back pain after fall several weeks ago. EXAM: LUMBAR SPINE - COMPLETE 4+ VIEW COMPARISON:  CT scan of December 05, 2016. Radiographs of July 28, 2016. FINDINGS: Severe levoscoliosis of lumbar spine is noted. Atherosclerosis of thoracic aorta is noted. No fracture or spondylolisthesis is noted. Severe degenerative disc disease is noted at all levels of the lumbar spine. IMPRESSION: Severe multilevel degenerative disc disease. Severe levoscoliosis of lumbar spine. No acute abnormality is noted. Electronically Signed   By: Lupita RaiderJames  Green Jr, M.D.   On: 12/20/2016 16:44   Ct Head Wo Contrast  Result Date: 12/20/2016 CLINICAL DATA:  Altered level of consciousness. EXAM: CT HEAD WITHOUT CONTRAST TECHNIQUE: Contiguous axial images were obtained from the base of the skull through the vertex without intravenous contrast. COMPARISON:  CT scan of September 27, 2015. FINDINGS: Brain: Mild diffuse cortical atrophy is noted. Mild chronic ischemic white matter disease is noted. No mass effect or midline shift is noted. Ventricular size is within normal limits. There is no evidence of mass lesion, hemorrhage or acute infarction. Vascular: No hyperdense vessel or unexpected calcification. Skull: Normal. Negative for fracture or focal lesion. Sinuses/Orbits: No acute finding. Other: None. IMPRESSION: Mild diffuse cortical atrophy. Mild chronic ischemic white matter disease. No acute intracranial abnormality seen. Electronically Signed   By: Lupita RaiderJames  Green Jr, M.D.   On: 12/20/2016 14:51   Dg Hip Unilat W Or Wo Pelvis 2-3 Views Right  Result Date: 12/20/2016 CLINICAL DATA:   Patient fell several weeks ago with worsening right hip pain. EXAM: DG HIP (WITH OR WITHOUT PELVIS) 2-3V RIGHT COMPARISON:  07/28/2016 FINDINGS: Chronic stable joint space narrowing of both hips without fracture or joint dislocation. The pubic rami appear intact. Phleboliths are noted within the pelvis. Tubing projects over the pubic symphysis. Levoconvex curvature of the visualized lower lumbar spine as before. The bony pelvis appears intact. Obturator rings appear intact. IMPRESSION: 1. Uniform bilateral joint space narrowing of the hips without acute osseous abnormality. 2. Levoscoliosis of the lower lumbar spine. Electronically Signed   By: Tollie Ethavid  Kwon M.D.   On: 12/20/2016 16:45     Medications:  Scheduled: . acidophilus  1 capsule Oral Daily  . amoxicillin  500 mg Oral Q12H  . aspirin EC  81 mg Oral Daily  . budesonide (PULMICORT) nebulizer solution  0.25 mg Nebulization BID  . enoxaparin (LOVENOX) injection  30 mg Subcutaneous Q24H  . levothyroxine  100 mcg Oral QAC breakfast  . metoprolol tartrate  50 mg Oral BID  . pravastatin  20 mg Oral Daily   Continuous: . sodium chloride     ION:[id number]:acetaminophen **OR** acetaminophen, diphenhydrAMINE,  ondansetron **OR** ondansetron (ZOFRAN) IV, oxyCODONE, polyethylene glycol, traMADol  Assessment/Plan:  Active Problems:   Paroxysmal ventricular tachycardia (HCC)   History of recurrent UTIs   Malnutrition of moderate degree   Tachycardia   AKI (acute kidney injury) (HCC)    Atrial flutter with RVR Patient placed on a diltiazem infusion.  Echocardiogram is pending.  Continue aspirin.  Cardiology consulted.  Patient not considered a good candidate for anticoagulation by cardiology due to previous falls, frailty.  Plan is to transition her to metoprolol.  Continue to monitor on telemetry.  Acute kidney injury/hyponatremia Patient's creatinine previously noted to be normal.  Creatinine noted to be elevated at the time of admission.  She was  also found to have hyponatremia.  Patient has had poor oral intake the last few days.  She was thought to be dehydrated.  Her diuretics were held.  She was gently hydrated.  Her creatinine has improved.  Sodium is improved some.  Continue to monitor closely.  Monitor urine output.  Consider renal ultrasound.  History of hypothyroidism Continue Synthroid.  TSH noted to be slightly elevated.  We would recommend that this be rechecked in the next 2-3 weeks.  No changes to medications at this time.  History of recurrent UTI and hydronephrosis Recently seen by urology.  Started on amoxicillin which will be continued..  Severe protein calorie malnutrition/physical deconditioning PT and OT evaluation.  Encourage oral intake.  Dysphagia most likely esophageal Patient with the history of esophageal stricture and hiatal hernia.  Previously has had esophageal dilatation.  Has not seen gastroenterology in over a year.  She has difficulty mainly with solid foods.  Able to tolerate liquids without much problem.  We were going to review previous GI notes however it appears that cardiology has already consulted gastroenterology.  Chronic systolic CHF/history of coronary artery disease Lasix was held due to hypotension and worsening renal function.  Follow-up on echocardiogram.  She also has pacemaker.  Chronic back pain and hip pain This is secondary to degenerative disease and disc disease.  No recent falls or injuries.  No injuries identified on imaging studies.  Normocytic anemia Monitor hemoglobin closely.  No evidence for overt blood loss.   DVT Prophylaxis: Lovenox    Code Status: DNR Family Communication: Discussed with the patient and her granddaughter Disposition Plan: Management as outlined above.    LOS: 1 day   Osvaldo Shipper  [facility] Pager [phone] 12/21/2016, 10:52 AM  If 7PM-7AM, please contact night-coverage at [email or web address], password [facility]

## 2016-12-21 NOTE — Progress Notes (Signed)
GI consult called to [facility]. Keonta Alsip PA-C

## 2016-12-21 NOTE — Care Management Note (Signed)
Case Management Note  Patient Details  Name: [name] MRN: [id number] Date of Birth: Jul 24, 1921  Subjective/Objective:                  A.flutter and iv Cardizem drip  Action/Plan: Date:  December 21, 2016 Chart reviewed for concurrent status and case management needs.  Will continue to follow patient progress.  Discharge Planning: following for needs  Expected discharge date: December 24, 2016  Marcelle SmilingRhonda Davis, BSN, LovellRN3, [place]CCM   [phone]   Expected Discharge Date:  (unknown)               Expected Discharge Plan:  Home/Self Care  In-House Referral:  Clinical Social Work  Discharge planning Services  CM Consult  Post Acute Care Choice:    Choice offered to:     DME Arranged:    DME Agency:     HH Arranged:    HH Agency:     Status of Service:  In process, will continue to follow  If discussed at Long Length of Stay Meetings, dates discussed:    Additional Comments:  Golda AcreDavis, Rhonda Lynn, RN 12/21/2016, 9:34 AM

## 2016-12-21 NOTE — Progress Notes (Signed)
Initial Nutrition Assessment  DOCUMENTATION CODES:   Not applicable  INTERVENTION:  - Continue Ensure Enlive BID, each supplement provides 350 kcal and 20 grams of protein - Continue to encourage PO intakes of meals and supplements.  NUTRITION DIAGNOSIS:   Inadequate oral intake related to acute illness, decreased appetite as evidenced by per patient/family report.  GOAL:   Patient will meet greater than or equal to 90% of their needs  MONITOR:   PO intake, Supplement acceptance, Weight trends, Labs  REASON FOR ASSESSMENT:   Consult Malnutrition Eval  ASSESSMENT:   83 year old Caucasian female with a past medical history of chronic systolic CHF with an ejection fraction of 45-50%, esophageal stricture, hypothyroidism, pacemaker, recurrent UTI, history of nephrolithiasis, history of hydronephrosis recently started on amoxicillin by her urologist has been having worsening back pain hip pain decreased oral intake.  On evaluation patient was found to have atrial flutter with RVR.  Patient was hospitalized for further management.  BMI indicates normal weight. No intakes documented since admission. Pt had lunch tray in front of her during RD visit and was sipping on her milk. Pt reports that she usually has a good appetite and eats well but that with being sick she has not felt hungry. Most of the time with pt was spent talking about non-nutrition-related things (such as pt's love of travel and places she has been).  Per chart review, her weight has been stable since at least March 2018. Physical assessment outlined below. Suspect that wasting is age-related atrophy rather than malnutrition-related.  Medications reviewed; 1 capsule Risaquad/day, 100 mcg oral Synthroid/day.  Labs reviewed; Na: 130 mmol/L, BUN: 53 mg/dL, creatinine: 1.911.32 mg/dL, Ca: 8.3 mg/dL, GFR: 34 mL/min.  IVF: NS @ 75 mL/hr.     NUTRITION - FOCUSED PHYSICAL EXAM:    Most Recent Value  Orbital Region  Mild  depletion  Upper Arm Region  Moderate depletion  Thoracic and Lumbar Region  No depletion  Buccal Region  Mild depletion  Temple Region  Mild depletion  Clavicle Bone Region  Mild depletion  Clavicle and Acromion Bone Region  Mild depletion  Scapular Bone Region  No depletion  Dorsal Hand  No depletion  Patellar Region  No depletion  Anterior Thigh Region  No depletion  Posterior Calf Region  No depletion  Edema (RD Assessment)  None  Hair  Reviewed  Eyes  Reviewed  Mouth  Reviewed  Skin  Reviewed  Nails  Reviewed       Diet Order:  Diet full liquid Room service appropriate? Yes; Fluid consistency: Thin  EDUCATION NEEDS:   No education needs have been identified at this time  Skin:  Skin Assessment: Reviewed RN Assessment  Last BM:  PTA/unknown  Height:   Ht Readings from Last 1 Encounters:  11/23/16 4\' 10"  (1.473 m)    Weight:   Wt Readings from Last 1 Encounters:  12/21/16 125 lb 0 oz (56.7 kg)    Ideal Body Weight:  42.91 kg  BMI:  Body mass index is 26.13 kg/m.  Estimated Nutritional Needs:   Kcal:  1250-1415 (22-25 kcal/kg)  Protein:  45-55 grams  Fluid:  >/= 1.4 L/day      Trenton GammonJessica Lettie Czarnecki, MS, RD, LDN, [facility] Inpatient Clinical Dietitian Pager # [phone] After hours/weekend pager # [phone]

## 2016-12-21 NOTE — Consult Note (Addendum)
Cardiology Consultation:   Patient ID: [name]; [id number]; 07/12/1921   Admit date: 12/20/2016 Date of Consult: 12/21/2016  Primary Care Provider: Helane Rima, DO  Primary Cardiologist:  Dr. Graciela Husbands  Chief Complaint: weakness, hip pain, poor oral intake  Patient Profile:   [name] is a 83 y.o. female with a hx of CAD with anterior wall MI, stenting of RCAx2 in 05/2001, DES to Cx in 2005 (last cath), ICM/chronic systolic CHF with CRT-D (gen changeout with downgrade to CRT-pacemaker without defibrillator 11/2015), HTN, HLD, GERD, esophageal stricture, hypothyroidism, paroxysmal VT (listed in PMH, details unclear), large hiatal hernia, LBBB who is being seen today for the evaluation of atrial flutter at the request of Dr. Rito Ehrlich.  History of Present Illness:   Last echo in 07/2016 showed EF 45-50%, moderate MR, severely dilated LA. Last office note by Dr. Graciela Husbands 11/2016 includes the diagnosis of atrial tachycardia/atrial fibrillation in problem list but no further details available. Device check in clinic showed 33 mode switches <1% > 2 hours AF noted from April 1. No changes were made to meds at that time. She has not been on anticoagulation. Dr. Odessa Fleming note alludes to recent issues with progressive esophageal stricture without ability to dilate, and possible end-of-life decisions with possible future need of GI tube. He had noted she had a cough which he was concerned about aspiration. She has been having urolologic issues with recurrent UTIs, nephrolithiasis and hydronephrosis. She has been on antibiotics recently. The daughter brought the patient to the ED yesterday due to gradual decline in her condition including back pain, hip pain, decreased PO intake, and fall with bruising on the head. The patient acknowledges "I know I'm not going to live forever." She and her granddaughter (present in room) have had EOL discussions and wish to maintain quality of life, but  understand that there is a limit to what can be done. However, the patient lives alone, cares for herself and her dog, is active in her church and has been driving up until recently, so comes from a very functional baseline.  In the ED, she was noted to be in rapid atrial flutter with preliminary ER note indicating that interrogation revealed this present since the 8th. Labs have revealed acute kidney injury (BUN 72/Cr 1.60, previously 0.91), hyponatremia 127, albumin 2.9, new onset anemia with hemoglobin down to 10.4 (previously 13-14 in 07/2016), troponin neg, TSH 5.483. CXR stable with evated right hemidiaphragm with mild right basilar subsegmental atelectasis and large hiatal hernia. Hip, L spine and head imaging nonacute. She was started on diltiazem with improvement in HR to the 70s. She currently denies any symptoms resting in bed. Denies any awareness of flutter including chest pain, palpitations or dyspnea. The fall she had recently was the only in the last year.   Past Medical History:  Diagnosis Date  . Anxiety   . Arthritis   . CAD (coronary artery disease)    a. h/o anterior wall MI. b. stenting of RCAx2 in 05/2001. c. DES to Cx in 2005 (last cath).  . Chronic systolic CHF (congestive heart failure) (HCC)   . DD (diverticular disease)    Hx of it - severe. Left colon 2004  . Esophageal stricture    last dialated 2007  . GERD (gastroesophageal reflux disease)   . Hiatal hernia   . HTN (hypertension)   . Hyperlipidemia   . Hypothyroidism   . Ischemic cardiomyopathy    EF 25-30% improved to 45% with biventricular  pacing.   [name] LBBB (left bundle branch block)   . Pacemaker    a. had prior ICD, but this was downgraded to CRT-P in 2017.  [name] PVT (paroxysmal ventricular tachycardia) (HCC)   . Sleep apnea    has a Cpap machine  . Systolic heart failure    improved to class I to II   . UTI (lower urinary tract infection)    recurrent. (Klebsiella pneumoniae). Last Cx 02/03/09. (R-ancef,  nitrofurantoin, Augmentin, Zyosyn)    Past Surgical History:  Procedure Laterality Date  . ANGIOPLASTY     stent  . APPENDECTOMY    . CARDIAC CATHETERIZATION     stent  . CARDIAC DEFIBRILLATOR PLACEMENT     [employer] Jude Atlas  . CATARACT EXTRACTION, BILATERAL    . COLONOSCOPY W/ POLYPECTOMY    . EP IMPLANTABLE DEVICE N/A 12/05/2015   Procedure: ICD Generator Changeout;  Surgeon: Duke Salvia, MD;  Location: [facility] INVASIVE CV LAB;  Service: Cardiovascular;  Laterality: N/A;  . HAMMER TOE SURGERY Left 01/15/2013   Procedure: LEFT SECOND THROUGH FOURTH HAMMERTOE CORRECTION ;  Surgeon: Toni Arthurs, MD;  Location: [facility] OR;  Service: Orthopedics;  Laterality: Left;  . HAMMERTOE RECONSTRUCTION WITH WEIL OSTEOTOMY Right 07/17/2012   Procedure: RIGHT SECOND AND THIRD MT WEIL OSTEOTOMIES AND SECOND AND THIRD HAMMERTOE CORRECTIONS;  Surgeon: Toni Arthurs, MD;  Location: [facility] OR;  Service: Orthopedics;  Laterality: Right;  . INSERT / REPLACE / REMOVE PACEMAKER    . TONSILLECTOMY    . TOTAL ABDOMINAL HYSTERECTOMY    . TOTAL KNEE ARTHROPLASTY Bilateral   . TUBAL LIGATION    . WEIL OSTEOTOMY Left 01/15/2013   Procedure:  WEIL OSTEOTOMY AND DORSAL CAPSULOTOMY;  Surgeon: Toni Arthurs, MD;  Location: [facility] OR;  Service: Orthopedics;  Laterality: Left;     Inpatient Medications: Scheduled Meds: . acidophilus  1 capsule Oral Daily  . amoxicillin  500 mg Oral Q12H  . aspirin EC  81 mg Oral Daily  . budesonide (PULMICORT) nebulizer solution  0.25 mg Nebulization BID  . enoxaparin (LOVENOX) injection  30 mg Subcutaneous Q24H  . levothyroxine  100 mcg Oral QAC breakfast  . pravastatin  20 mg Oral Daily   Continuous Infusions: . diltiazem (CARDIZEM) infusion 5 mg/hr (12/21/16 0600)   PRN Meds: acetaminophen **OR** acetaminophen, diphenhydrAMINE, ondansetron **OR** ondansetron (ZOFRAN) IV, oxyCODONE, polyethylene glycol, traMADol  Home Meds: Prior to Admission medications   Medication Sig Start Date End Date  Taking? Authorizing Provider  albuterol (PROVENTIL HFA;VENTOLIN HFA) 108 (90 Base) MCG/ACT inhaler Inhale 2 puffs into the lungs every 6 (six) hours as needed for wheezing or shortness of breath. 08/18/15  Yes Coralyn Helling, MD  amoxicillin (AMOXIL) 250 MG/5ML suspension Take 500 mg by mouth every 12 (twelve) hours. FOR 7 DAYS   Yes [provider]  aspirin (ASPIR-81) 81 MG EC tablet Take 81 mg by mouth daily.     Yes [provider]  beclomethasone (QVAR) 40 MCG/ACT inhaler Inhale 2 puffs into the lungs 2 (two) times daily. 02/18/15  Yes Saguier, Ramon Dredge, PA-C  bifidobacterium infantis (ALIGN) capsule Take 1 capsule by mouth daily.   Yes [provider]  diphenhydrAMINE (BENADRYL) 25 mg capsule Take 25 mg by mouth every 6 (six) hours as needed for itching or allergies.   Yes [provider]  doxycycline (VIBRAMYCIN) 100 MG capsule Take 100 mg by mouth 2 (two) times daily. 12/14/16  Yes [provider]  furosemide (LASIX) 20 MG tablet Take  1 tablet (20 mg total) by mouth daily as needed for fluid or edema. 07/30/16  Yes Maxie Barb, MD  isosorbide mononitrate (IMDUR) 30 MG 24 hr tablet TAKE 1 & 1/2 (ONE & ONE-HALF) TABLETS BY MOUTH ONCE DAILY IN THE MORNING 11/13/16  Yes Seabron Spates R, DO  levothyroxine (SYNTHROID, LEVOTHROID) 100 MCG tablet TAKE 1 TABLET BY MOUTH IN THE MORNING BEFORE BREAKFAST 10/30/16  Yes Lowne Chase, Yvonne R, DO  Liniments (SALONPAS PAIN RELIEF PATCH EX) Apply 1 patch topically daily as needed (pain).    Yes [provider]  losartan (COZAAR) 50 MG tablet TAKE 1 TABLET BY MOUTH ONCE DAILY  YOU NEED APPOINTMENT FOR MORE REFILLS 09/28/16  Yes Seabron Spates R, DO  metoprolol succinate (TOPROL-XL) 50 MG 24 hr tablet TAKE ONE TABLET BY MOUTH TWICE DAILY WITH  OR  IMMEDIATELY  FOLLOWING  A  MEAL 09/17/16  Yes Seabron Spates R, DO  nitroGLYCERIN (NITROSTAT) 0.4 MG SL tablet Place 1 tablet (0.4 mg total) under the  tongue every 5 (five) minutes as needed. At the onset of chest pain, Up to 3 doses 01/31/16  Yes Duke Salvia, MD  NON FORMULARY C-Pap machine, uses nightly for sleep apnea   Yes [provider]  pravastatin (PRAVACHOL) 20 MG tablet TAKE 1 TABLET BY MOUTH ONCE DAILY 09/17/16  Yes Seabron Spates R, DO  Psyllium (METAFIBER) 48.57 % POWD Take 10 mLs by mouth every evening. Mixed with water   Yes [provider]  traMADol (ULTRAM) 50 MG tablet Take 50 mg by mouth every 12 (twelve) hours as needed for moderate pain.   Yes [provider]    Allergies:    Allergies  Allergen Reactions  . Sulfonamide Derivatives Itching and Rash    Social History:   Social History   Socioeconomic History  . Marital status: Widowed    Spouse name: Not on file  . Number of children: 4  . Years of education: Not on file  . Highest education level: Not on file  Social Needs  . Financial resource strain: Not on file  . Food insecurity - worry: Not on file  . Food insecurity - inability: Not on file  . Transportation needs - medical: Not on file  . Transportation needs - non-medical: Not on file  Occupational History  . Occupation: Retired  Tobacco Use  . Smoking status: Never Smoker  . Smokeless tobacco: Never Used  Substance and Sexual Activity  . Alcohol use: No    Alcohol/week: 0.0 oz  . Drug use: No  . Sexual activity: Not on file  Other Topics Concern  . Not on file  Social History Narrative   Widowed, husband died of brain tumor. Son lives in [place], [place]- was in a MVA for DWI. He has had a long hx of alcohol problems, she has helped him out financially in the past but is ready to be much stricter. Pt. Has a daughter who live in [place] with Breast Ca. She also lost a son to a brain tumor.     Family History:   The patient's family history includes Alcohol abuse in her son; Brain cancer in her son; Breast cancer in her daughter; Colon polyps in her sister; Diabetes in  her daughter and unknown relative; Heart disease in her brother, father, mother, and sister; Lung cancer in her son; Prostate cancer in her brother. There is no history of Colon cancer.  ROS:  Please see  the history of present illness.  All other ROS reviewed and negative.     Physical Exam/Data:   Vitals:   12/21/16 0400 12/21/16 0500 12/21/16 0600 12/21/16 0758  BP: (!) 97/45 (!) 124/57 (!) 101/44   Pulse: 75 95 74   Resp: 14 20 14    Temp: 97.6 F (36.4 C)   98.3 F (36.8 C)  TempSrc: Axillary   Oral  SpO2: 94% 94% 95%   Weight: 125 lb 0 oz (56.7 kg)       Intake/Output Summary (Last 24 hours) at 12/21/2016 0831 Last data filed at 12/21/2016 0700 Gross per 24 hour  Intake 873.45 ml  Output 650 ml  Net 223.45 ml   Filed Weights   12/21/16 0400  Weight: 125 lb 0 oz (56.7 kg)   Body mass index is 26.13 kg/m.  General: Frail appearing thin WF, in no acute distress. Head: Normocephalic, atraumatic, sclera non-icteric, no xanthomas, nares are without discharge.  Neck: Negative for carotid bruits. JVD not elevated. Lungs: Clear bilaterally to auscultation without wheezes, rales, or rhonchi. Breathing is unlabored. Heart: Irregularly irregular, rate controlled, with S1 S2. No murmurs, rubs, or gallops appreciated. Abdomen: Soft, non-tender, non-distended with normoactive bowel sounds. No hepatomegaly. No rebound/guarding. No obvious abdominal masses. Msk:  Strength and tone appear normal for age. Extremities: No clubbing or cyanosis. No edema.  Distal pedal pulses are 2+ and equal bilaterally. Neuro: Alert and oriented X 3. No facial asymmetry. No focal deficit. Moves all extremities spontaneously. Essential tremor noted of head Psych:  Responds to questions appropriately with a normal affect.  EKG:  The EKG was personally reviewed and demonstrates atrial flutter RVR with LBBB  Relevant CV Studies: Last cath 10/2003  CONCLUSION:  1.  Coronary artery disease, status post  prior anterior wall myocardial      infarction and prior stenting of the right coronary artery with less      than 10% stenosis at the stent sites in the mid and distal right      coronary artery and 50% narrowing in the posterolateral branch of the      right coronary artery, 80% narrowing in the distal circumflex artery,      and total occlusion of the left anterior descending.  2.  Apical and inferior wall hypokinesis with an estimated ejection fraction      of 25%.  3.  Successful stenting of the lesion in the distal circumflex artery using      a Taxus drug-eluding stent with improvement in percent diameter      narrowing from 80% to 0%.  Last nuc in 2014 without signs of ischemia.   Laboratory Data:  Chemistry Recent Labs  Lab 12/20/16 1225 12/20/16 1318 12/21/16 0451  NA 127* 128* 130*  K 5.0 4.8 4.4  CL 93* 94* 103  CO2 25  --  21*  GLUCOSE 127* 122* 109*  BUN 72* 61* 53*  CREATININE 1.60* 1.60* 1.32*  CALCIUM 9.4  --  8.3*  GFRNONAA 27*  --  34*  GFRAA 31*  --  39*  ANIONGAP 9  --  6    Recent Labs  Lab 12/20/16 1225  PROT 6.4*  ALBUMIN 2.9*  AST 18  ALT 14  ALKPHOS 64  BILITOT 0.7   Hematology Recent Labs  Lab 12/20/16 1225 12/20/16 1318 12/21/16 0451  WBC 10.0  --  9.4  RBC 3.82*  --  3.38*  HGB 11.7* 12.6 10.4*  HCT 33.7* 37.0 30.1*  MCV 88.2  --  89.1  MCH 30.6  --  30.8  MCHC 34.7  --  34.6  RDW 13.1  --  13.3  PLT 225  --  221   Cardiac EnzymesNo results for input(s): TROPONINI in the last 168 hours.  Recent Labs  Lab 12/20/16 1311  TROPIPOC 0.01    BNPNo results for input(s): BNP, PROBNP in the last 168 hours.  DDimer No results for input(s): DDIMER in the last 168 hours.  Radiology/Studies:  Dg Chest 2 View  Result Date: 12/20/2016 CLINICAL DATA:  Congestive heart failure. EXAM: CHEST  2 VIEW COMPARISON:  Radiographs of July 27, 2016. FINDINGS: Stable cardiomediastinal silhouette. Atherosclerosis of thoracic aorta is noted.  Stable elevated right hemidiaphragm is noted with mild right basilar subsegmental atelectasis. Stable large hiatal hernia is noted. Left-sided pacemaker is unchanged in position. Bony thorax is unremarkable. IMPRESSION: Aortic atherosclerosis. Stable elevated right hemidiaphragm with mild right basilar subsegmental atelectasis. Stable large hiatal hernia. Electronically Signed   By: Lupita Raider, M.D.   On: 12/20/2016 16:35   Dg Lumbar Spine Complete  Result Date: 12/20/2016 CLINICAL DATA:  Acute on chronic low back pain after fall several weeks ago. EXAM: LUMBAR SPINE - COMPLETE 4+ VIEW COMPARISON:  CT scan of December 05, 2016. Radiographs of July 28, 2016. FINDINGS: Severe levoscoliosis of lumbar spine is noted. Atherosclerosis of thoracic aorta is noted. No fracture or spondylolisthesis is noted. Severe degenerative disc disease is noted at all levels of the lumbar spine. IMPRESSION: Severe multilevel degenerative disc disease. Severe levoscoliosis of lumbar spine. No acute abnormality is noted. Electronically Signed   By: Lupita Raider, M.D.   On: 12/20/2016 16:44   Ct Head Wo Contrast  Result Date: 12/20/2016 CLINICAL DATA:  Altered level of consciousness. EXAM: CT HEAD WITHOUT CONTRAST TECHNIQUE: Contiguous axial images were obtained from the base of the skull through the vertex without intravenous contrast. COMPARISON:  CT scan of September 27, 2015. FINDINGS: Brain: Mild diffuse cortical atrophy is noted. Mild chronic ischemic white matter disease is noted. No mass effect or midline shift is noted. Ventricular size is within normal limits. There is no evidence of mass lesion, hemorrhage or acute infarction. Vascular: No hyperdense vessel or unexpected calcification. Skull: Normal. Negative for fracture or focal lesion. Sinuses/Orbits: No acute finding. Other: None. IMPRESSION: Mild diffuse cortical atrophy. Mild chronic ischemic white matter disease. No acute intracranial abnormality seen.  Electronically Signed   By: Lupita Raider, M.D.   On: 12/20/2016 14:51   Dg Hip Unilat W Or Wo Pelvis 2-3 Views Right  Result Date: 12/20/2016 CLINICAL DATA:  Patient fell several weeks ago with worsening right hip pain. EXAM: DG HIP (WITH OR WITHOUT PELVIS) 2-3V RIGHT COMPARISON:  07/28/2016 FINDINGS: Chronic stable joint space narrowing of both hips without fracture or joint dislocation. The pubic rami appear intact. Phleboliths are noted within the pelvis. Tubing projects over the pubic symphysis. Levoconvex curvature of the visualized lower lumbar spine as before. The bony pelvis appears intact. Obturator rings appear intact. IMPRESSION: 1. Uniform bilateral joint space narrowing of the hips without acute osseous abnormality. 2. Levoscoliosis of the lower lumbar spine. Electronically Signed   By: Tollie Eth M.D.   On: 12/20/2016 16:45    Assessment and Plan:   1. Generalized failure to thrive picture recently with worsening urologic issues, hydronephrosis, poor oral intake, fall - pt acknowledges generalized decline over the last several months and wishes to focus on maintaining good  quality of life.  2. New onset atrial flutter - prior device interrogation mentioned mode switches and small burden of atrial fib. ED note indicates has been in flutter since the 8th. CHADSVASC 6 which would typically warrant anticoagulation. However, given her recent frailty, fall, and 3-4g hemoglobin drop, I am not certain that she is a good candidate for anticoagulation. Additionally, although diltiazem is controlling HR presently, this is not the best option long-term given her known LV Dysfunction. Would likely recommend consolidating back to beta blocker. I will review the above with Dr. Delton SeeNelson.  3. New onset anemia - prior hemoglobin was 13-14, now down to 10. Have placed order for stool hemoccult and f/u CBC and anemia panel. Further management per IM. Patient denies signs of overt bleeding.  4. Acute  kidney injury - improving. Caution with IV fluids given h/o CHF. ARB on hold.  5. Abnormal TSH (high) - further per internal medicine.  For questions or updates, please contact [facility] Please consult [email or web address] for contact info under Cardiology/STEMI.   Signed, Laurann Montanaayna N Dunn, PA-C  12/21/2016 8:31 AM   The patient was seen, examined and discussed with Ronie Spiesayna Dunn, PA-C and I agree with the above.   83 y.o. female with a hx of CAD with anterior wall MI, stenting of RCAx2 in 05/2001, DES to Cx in 2005 (last cath), ICM/chronic systolic CHF with CRT-D (gen changeout with downgrade to CRT-pacemaker without defibrillator 11/2015), HTN, HLD, GERD, esophageal stricture, hypothyroidism, paroxysmal VT (listed in PMH, details unclear), large hiatal hernia, LBBB, last echo in 07/2016 showed EF 45-50%, moderate MR, severely dilated LA. Last office note by Dr. Graciela HusbandsKlein 11/2016 includes the diagnosis of atrial tachycardia/atrial fibrillation in problem list but no further details available. Device check in clinic showed 33 mode switches <1% > 2 hours AF noted from April 1. She has not been on anticoagulation. Dr. Odessa FlemingKlein's note alludes to recent issues with progressive esophageal stricture without ability to dilate, she has difficulties to eat, failure to thrive. ? Need for a GI feeding tube.  She presented yesterday with gradual decline in her condition including back pain, hip pain, decreased PO intake, and fall with bruising on the head. Interrogation of her PM showed a-flutter with RVR since 12/8, she was started on a low dose of cardizem drip 5 mg/hr and her rate is now controlled.  She was also found to be in acute on chronic kidney failure baseline crea 0.9, on admission 1.6, today 1.3.  Hyponatremia 127, albumin 2.9, new onset anemia with hemoglobin down to 10.4 (previously 13-14 in 07/2016), troponin neg, TSH 5.483. CXR stable with evated right hemidiaphragm with mild right basilar subsegmental  atelectasis and large hiatal hernia. She is being treated for recurrent UTI with amoxicillin.  CHADS-VASc score is 6, however she is a poor anticoagulation candidate given her age, h/o fall, new onset anemia and failure to thrive, GI issues.  I would focus on rate control, I would switch home toprol XL and inpatient cardizem 5 mg/hr to metoprolol 50 mg po BID and follow the rates.   The patient is struggling to eat sec to esophageal stricture, she has lost almost 20 lbs in the last year and has now difficulties to take liquids and take her pills, she is otherwise fully independent, living alone and driving with no dementia. Wew ill consult GI fir consideration of placement of a feeding tube.  Continue iv fluids.  Tobias AlexanderKatarina Cade Dashner, MD 12/21/2016

## 2016-12-21 NOTE — Evaluation (Signed)
Occupational Therapy Evaluation Patient Details Name: [name] MRN: [name] DOB: October 05, 1921 Today's Date: 12/21/2016    History of Present Illness this 83 year old female was admitted for back pain, decreased oral intake and worsening R hip pain. Dx'd with recurrent utis.  H/o scoliosis, CHF, cardiomyopathy   Clinical Impression   Pt was admitted for the above.  Son in law present and states that daughter in law (not his wife) comes daily to assist pt.  Pt uses cane or 4 wheel walker at home.  Will follow in acute setting to increase independence with adls. Goals are for min guard to min A.  Pt needs +2 for safety at this time    Follow Up Recommendations  SNF;Supervision/Assistance - 24 hour(vs.  HHOT if home)    Equipment Recommendations  (? 3:1, depending upon progress)    Recommendations for Other Services       Precautions / Restrictions Precautions Precautions: Fall Restrictions Weight Bearing Restrictions: No      Mobility Bed Mobility Overal bed mobility: Needs Assistance Bed Mobility: Supine to Sit     Supine to sit: Min assist        Transfers Overall transfer level: Needs assistance Equipment used: Rolling walker (2 wheeled) Transfers: Sit to/from UGI CorporationStand;Stand Pivot Transfers Sit to Stand: Min assist Stand pivot transfers: Min assist;+2 safety/equipment       General transfer comment: +2 for safety/lines and to move commode    Balance                                           ADL either performed or assessed with clinical judgement   ADL Overall ADL's : Needs assistance/impaired Eating/Feeding: Set up;Sitting   Grooming: Set up;Sitting   Upper Body Bathing: Set up;Sitting   Lower Body Bathing: Moderate assistance;Sit to/from stand   Upper Body Dressing : Minimal assistance;Sitting   Lower Body Dressing: Maximal assistance;Sit to/from stand   Toilet Transfer: Minimal assistance;Stand-pivot;BSC;RW;+2 for  safety/equipment   Toileting- Clothing Manipulation and Hygiene: Minimal assistance;+2 for safety/equipment;Sit to/from stand         General ADL Comments: ambulated to door; tolerated well min +2 for safety.  After this performed SPT to 3:1 and HR to 118 from 60-80s. RN aware     Vision         Perception     Praxis      Pertinent Vitals/Pain Pain Assessment: Faces Faces Pain Scale: Hurts even more Pain Location: r thigh Pain Descriptors / Indicators: Grimacing Pain Intervention(s): Limited activity within patient's tolerance;Monitored during session;Premedicated before session;Repositioned     Hand Dominance     Extremity/Trunk Assessment Upper Extremity Assessment Upper Extremity Assessment: Overall WFL for tasks assessed           Communication Communication Communication: HOH   Cognition Arousal/Alertness: Awake/alert Behavior During Therapy: WFL for tasks assessed/performed Overall Cognitive Status: Within Functional Limits for tasks assessed                                     General Comments       Exercises     Shoulder Instructions      Home Living Family/patient expects to be discharged to:: Private residence Living Arrangements: Alone Available Help at Discharge: Family  Bathroom Shower/Tub: Chief Strategy OfficerTub/shower unit   Bathroom Toilet: Handicapped height     Home Equipment: Emergency planning/management officerhower seat;Walker - 2 wheels          Prior Functioning/Environment Level of Independence: Needs assistance        Comments: daughter in law comes daily and helps as needed. Pt uses cane or 4 wheel walker        OT Problem List: Decreased strength;Decreased activity tolerance;Impaired balance (sitting and/or standing);Decreased knowledge of use of DME or AE;Pain;Cardiopulmonary status limiting activity      OT Treatment/Interventions: Self-care/ADL training;DME and/or AE instruction;Therapeutic activities;Patient/family  education;Balance training    OT Goals(Current goals can be found in the care plan section) Acute Rehab OT Goals Patient Stated Goal: none stated; agreeable to working with therapy OT Goal Formulation: With patient Time For Goal Achievement: 07/27/16 Potential to Achieve Goals: Good ADL Goals Pt Will Transfer to Toilet: with min guard assist;ambulating;regular height toilet;bedside commode Pt Will Perform Toileting - Clothing Manipulation and hygiene: with min guard assist;sit to/from stand Additional ADL Goal #1: pt will complete LB adls with min guard, sit to stand  OT Frequency: Min 2X/week   Barriers to D/C:            Co-evaluation PT/OT/SLP Co-Evaluation/Treatment: Yes Reason for Co-Treatment: For patient/therapist safety PT goals addressed during session: Mobility/safety with mobility OT goals addressed during session: ADL's and self-care      AM-PAC PT "6 Clicks" Daily Activity     Outcome Measure Help from another person eating meals?: A Little Help from another person taking care of personal grooming?: A Little Help from another person toileting, which includes using toliet, bedpan, or urinal?: A Little Help from another person bathing (including washing, rinsing, drying)?: A Lot Help from another person to put on and taking off regular upper body clothing?: A Little Help from another person to put on and taking off regular lower body clothing?: A Lot 6 Click Score: 16   End of Session    Activity Tolerance: Patient tolerated treatment well Patient left: in chair;with call bell/phone within reach;with chair alarm set;with family/visitor present  OT Visit Diagnosis: Unsteadiness on feet (R26.81);Muscle weakness (generalized) (M62.81)                Time: [id number] OT Time Calculation (min): 48 min Charges:  OT General Charges $OT Visit: 1 Visit OT Evaluation $OT Eval Moderate Complexity: 1 Mod OT Treatments $Self Care/Home Management : 8-22 mins G-Codes:      West DunbarMaryellen Parisha [name], OTR/L [phone] 12/21/2016  [name] 12/21/2016, 3:09 PM

## 2016-12-21 NOTE — Progress Notes (Signed)
  Echocardiogram 2D Echocardiogram has been performed.  Leta JunglingCooper, Colston Pyle M 12/21/2016, 10:36 AM

## 2016-12-21 NOTE — Consult Note (Signed)
Consultation  Referring Provider:  Dr Nelson/ Cardiology Primary Care Physician:  Briscoe Deutscher, DO Primary Gastroenterologist:  Dr.Nandigam  Reason for Consultation:  Dysphasia, weight loss  HPI: [name] is a 83 y.o. female who was admitted through the emergency room yesterday, brought in by family with decreased oral intake, increased weakness, act pain and worsening right hip pain. She had been diagnosed with a UTI recently and had been on antibiotics. She had also had a fall at home. Patient was found to be in atrial flutter with RVR in the ER, and cardiology was consulted and she has been started on a diltiazem drip. She was also noted to have hyponatremia and worsening renal function. Patient has history of congestive heart failure with EF of 3540%, coronary artery disease status post multiple prior PTC eyes and MI, she is status post ICD, She has had problems with recurrent urinary tract infections and hydronephrosis and is followed by urology. Patient is feeling better today, her weight is being controlled. Family had expressed concern about her dysphagia which seems to be a bit worse over the past few months. Patient had been well known to Dr. Delfin Edis and has a previously documented history of large hiatal hernia and distal esophageal stricture. She had undergone several esophageal dilations in the past. She was last dilated in 2010 with Savary from 14-16 mm. She says that was helpful. Patient remembers being told by Dr. Olevia Perches that she could not dilate her any further inpatient interpreted that as meaning that she could not ever have any further endoscopy with dilation. She had undergone a barium swallow in September 2016 which showed a large hiatal hernia, marked esophageal dysmotility and a barium tablet did not pass from the distal esophagus into the stomach.  Patient says that she has had some increased problems over the past several months. She has had about a  10-12 pound weight loss over the past year according to her granddaughter. They have been giving her boost on a daily basis. Patient says she has learned over the years help to deal with her dysphagia and watches what she eats. She does have intermittent episodes with food sticking in her esophagus which requires regurgitation. She says sometimes she can just stop eating and weight and the food will go on down. She has had some heartburn recently, she had not been on any acid blocker at home. She has continued to eat meat and bread though she says she cuts things up very small. She is not interested in having any further procedures, and says that she knows she is very old and that she is not going to live forever. Past Medical History:  Diagnosis Date  . Anxiety   . Arthritis   . CAD (coronary artery disease)    a. h/o anterior wall MI. b. stenting of RCAx2 in 05/2001. c. DES to Cx in 2005 (last cath).  . Chronic systolic CHF (congestive heart failure) ([place])   . DD (diverticular disease)    Hx of it - severe. Left colon 2004  . Esophageal stricture    last dialated 2007  . GERD (gastroesophageal reflux disease)   . Hiatal hernia   . HTN (hypertension)   . Hyperlipidemia   . Hypothyroidism   . Ischemic cardiomyopathy    EF 25-30% improved to 45% with biventricular pacing.   [name] LBBB (left bundle branch block)   . Pacemaker    a. had prior ICD, but this was downgraded to  CRT-P in 2017.  [name] PVT (paroxysmal ventricular tachycardia) ([place])   . Sleep apnea    has a Cpap machine  . UTI (lower urinary tract infection)    recurrent. (Klebsiella pneumoniae). Last Cx 02/03/09. (R-ancef, nitrofurantoin, Augmentin, Zyosyn)    Past Surgical History:  Procedure Laterality Date  . ANGIOPLASTY     stent  . APPENDECTOMY    . CARDIAC CATHETERIZATION     stent  . CARDIAC DEFIBRILLATOR PLACEMENT     [employer]  . CATARACT EXTRACTION, BILATERAL    . COLONOSCOPY W/ POLYPECTOMY    . EP IMPLANTABLE  DEVICE N/A 12/05/2015   Procedure: ICD Generator Changeout;  Surgeon: Deboraha Sprang, MD;  Location: [facility];  Service: Cardiovascular;  Laterality: N/A;  . HAMMER TOE SURGERY Left 01/15/2013   Procedure: LEFT SECOND THROUGH FOURTH HAMMERTOE CORRECTION ;  Surgeon: Wylene Simmer, MD;  Location: [facility];  Service: Orthopedics;  Laterality: Left;  . HAMMERTOE RECONSTRUCTION WITH WEIL OSTEOTOMY Right 07/17/2012   Procedure: RIGHT SECOND AND THIRD MT WEIL OSTEOTOMIES AND SECOND AND THIRD HAMMERTOE CORRECTIONS;  Surgeon: Wylene Simmer, MD;  Location: [facility];  Service: Orthopedics;  Laterality: Right;  . INSERT / REPLACE / REMOVE PACEMAKER    . TONSILLECTOMY    . TOTAL ABDOMINAL HYSTERECTOMY    . TOTAL KNEE ARTHROPLASTY Bilateral   . TUBAL LIGATION    . WEIL OSTEOTOMY Left 01/15/2013   Procedure:  WEIL OSTEOTOMY AND DORSAL CAPSULOTOMY;  Surgeon: Wylene Simmer, MD;  Location: [facility];  Service: Orthopedics;  Laterality: Left;    Prior to Admission medications   Medication Sig Start Date End Date Taking? Authorizing Provider  albuterol (PROVENTIL HFA;VENTOLIN HFA) 108 (90 Base) MCG/ACT inhaler Inhale 2 puffs into the lungs every 6 (six) hours as needed for wheezing or shortness of breath. 08/18/15  Yes Chesley Mires, MD  amoxicillin (AMOXIL) 250 MG/5ML suspension Take 500 mg by mouth every 12 (twelve) hours. FOR 7 DAYS   Yes [provider]  aspirin (ASPIR-81) 81 MG EC tablet Take 81 mg by mouth daily.     Yes [provider]  beclomethasone (QVAR) 40 MCG/ACT inhaler Inhale 2 puffs into the lungs 2 (two) times daily. 02/18/15  Yes Saguier, Percell Miller, PA-C  bifidobacterium infantis (ALIGN) capsule Take 1 capsule by mouth daily.   Yes [provider]  diphenhydrAMINE (BENADRYL) 25 mg capsule Take 25 mg by mouth every 6 (six) hours as needed for itching or allergies.   Yes [provider]  doxycycline (VIBRAMYCIN) 100 MG capsule Take 100 mg by mouth 2 (two) times daily. 12/14/16   Yes [provider]  furosemide (LASIX) 20 MG tablet Take 1 tablet (20 mg total) by mouth daily as needed for fluid or edema. 07/30/16  Yes Rosita Fire, MD  isosorbide mononitrate (IMDUR) 30 MG 24 hr tablet TAKE 1 & 1/2 (ONE & ONE-HALF) TABLETS BY MOUTH ONCE DAILY IN THE MORNING 11/13/16  Yes Roma Schanz R, DO  levothyroxine (SYNTHROID, LEVOTHROID) 100 MCG tablet TAKE 1 TABLET BY MOUTH IN THE MORNING BEFORE BREAKFAST 10/30/16  Yes Lowne Chase, Yvonne R, DO  Liniments (SALONPAS PAIN RELIEF PATCH EX) Apply 1 patch topically daily as needed (pain).    Yes [provider]  losartan (COZAAR) 50 MG tablet TAKE 1 TABLET BY MOUTH ONCE DAILY YOU NEED APPOINTMENT FOR MORE REFILLS 09/28/16  Yes Roma Schanz R, DO  metoprolol succinate (TOPROL-XL) 50 MG 24 hr tablet TAKE ONE TABLET  BY MOUTH TWICE DAILY WITH  OR  IMMEDIATELY  FOLLOWING  A  MEAL 09/17/16  Yes Roma Schanz R, DO  nitroGLYCERIN (NITROSTAT) 0.4 MG SL tablet Place 1 tablet (0.4 mg total) under the tongue every 5 (five) minutes as needed. At the onset of chest pain, Up to 3 doses 01/31/16  Yes Deboraha Sprang, MD  NON FORMULARY C-Pap machine, uses nightly for sleep apnea   Yes [provider]  pravastatin (PRAVACHOL) 20 MG tablet TAKE 1 TABLET BY MOUTH ONCE DAILY 09/17/16  Yes Roma Schanz R, DO  Psyllium (METAFIBER) 48.57 % POWD Take 10 mLs by mouth every evening. Mixed with water   Yes [provider]  traMADol (ULTRAM) 50 MG tablet Take 50 mg by mouth every 12 (twelve) hours as needed for moderate pain.   Yes [provider]    Current Facility-Administered Medications  Medication Dose Route Frequency Provider Last Rate Last Dose  . 0.9 %  sodium chloride infusion   Intravenous Continuous Doriana Spark, MD      . acetaminophen (TYLENOL) tablet 650 mg  650 mg Oral Q6H PRN Rosita Fire, MD       Or  . acetaminophen (TYLENOL) suppository 650 mg  650 mg Rectal  Q6H PRN Rosita Fire, MD      . acidophilus (RISAQUAD) capsule 1 capsule  1 capsule Oral Daily Rosita Fire, MD   1 capsule at 12/20/16 2234  . amoxicillin (AMOXIL) 250 MG/5ML suspension 500 mg  500 mg Oral Q12H Rosita Fire, MD   500 mg at 12/20/16 2235  . aspirin EC tablet 81 mg  81 mg Oral Daily Rosita Fire, MD      . budesonide (PULMICORT) nebulizer solution 0.25 mg  0.25 mg Nebulization BID Eudelia Bunch, RPH   0.25 mg at 12/21/16 0916  . diphenhydrAMINE (BENADRYL) capsule 25 mg  25 mg Oral Q6H PRN Rosita Fire, MD      . enoxaparin (LOVENOX) injection 30 mg  30 mg Subcutaneous Q24H Rosita Fire, MD   30 mg at 12/20/16 2235  . levothyroxine (SYNTHROID, LEVOTHROID) tablet 100 mcg  100 mcg Oral QAC breakfast Rosita Fire, MD   100 mcg at 12/21/16 [phone]  . metoprolol tartrate (LOPRESSOR) tablet 50 mg  50 mg Oral BID Meital Spark, MD      . ondansetron [facility]) tablet 4 mg  4 mg Oral Q6H PRN Rosita Fire, MD       Or  . ondansetron [facility]) injection 4 mg  4 mg Intravenous Q6H PRN Rosita Fire, MD      . oxyCODONE (Oxy IR/ROXICODONE) immediate release tablet 5 mg  5 mg Oral Q6H PRN Rosita Fire, MD   5 mg at 12/21/16 [phone]  . polyethylene glycol (MIRALAX / GLYCOLAX) packet 17 g  17 g Oral Daily PRN Rosita Fire, MD   17 g at 12/20/16 2039  . pravastatin (PRAVACHOL) tablet 20 mg  20 mg Oral Daily Rosita Fire, MD      . traMADol Veatrice Bourbon) tablet 50 mg  50 mg Oral Q12H PRN Rosita Fire, MD        Allergies as of 12/20/2016 - Review Complete 12/20/2016  Allergen Reaction Noted  . Sulfonamide derivatives Itching and Rash 03/01/2009    Family History  Problem Relation Age of Onset  . Heart disease Mother   . Heart disease Father   . Colon  polyps Sister   . Heart disease Brother   . Heart disease Sister   . Prostate cancer Brother   . Diabetes Daughter   . Diabetes  Unknown   . Breast cancer Daughter   . Alcohol abuse Son   . Brain cancer Son   . Lung cancer Son   . Colon cancer Neg Hx     Social History   Socioeconomic History  . Marital status: Widowed    Spouse name: Not on file  . Number of children: 4  . Years of education: Not on file  . Highest education level: Not on file  Social Needs  . Financial resource strain: Not on file  . Food insecurity - worry: Not on file  . Food insecurity - inability: Not on file  . Transportation needs - medical: Not on file  . Transportation needs - non-medical: Not on file  Occupational History  . Occupation: Retired  Tobacco Use  . Smoking status: Never Smoker  . Smokeless tobacco: Never Used  Substance and Sexual Activity  . Alcohol use: No    Alcohol/week: 0.0 oz  . Drug use: No  . Sexual activity: Not on file  Other Topics Concern  . Not on file  Social History Narrative   Widowed, husband died of brain tumor. Son lives in [place], [place]- was in a MVA for DWI. He has had a long hx of alcohol problems, she has helped him out financially in the past but is ready to be much stricter. Pt. Has a daughter who live in [place] with Breast Ca. She also lost a son to a brain tumor.     Review of Systems: Pertinent positive and negative review of systems were noted in the above HPI section.  All other review of systems was otherwise negative.[name]  Physical Exam: Vital signs in last 24 hours: Temp:  [97.5 F (36.4 C)-98.3 F (36.8 C)] 98.3 F (36.8 C) (12/14 0758) Pulse Rate:  [36-128] 74 (12/14 0600) Resp:  [14-27] 14 (12/14 0600) BP: (88-140)/(44-100) 101/44 (12/14 0600) SpO2:  [84 %-100 %] 96 % (12/14 0916) Weight:  [125 lb 0 oz (56.7 kg)] 125 lb 0 oz (56.7 kg) (12/14 0400)   General:   Alert,  Well-developed, small thin and frail appearing very elderly white female , pleasant and cooperative in NAD, somewhat hard of hearing, granddaughter at bedside Head:  Normocephalic and atraumatic. Eyes:  Sclera  clear, no icterus.   Conjunctiva pink. Ears:  Normal auditory acuity. Nose:  No deformity, discharge,  or lesions. Mouth:  No deformity or lesions.   Neck:  Supple; no masses or thyromegaly. Lungs:  Clear throughout to auscultation.   No wheezes, crackles, or rhonchi. ICD in left chest wall  Heart: ir Regular rate and rhythm; no murmurs, clicks, rubs,  or gallops. Abdomen:  Soft,nontender, BS active,nonpalp mass or hsm.   Rectal:  Deferred  Msk:  Symmetrical without gross deformities. . Pulses:  Normal pulses noted. Extremities:  Without clubbing or edema. Neurologic:  Alert and  oriented x4;  grossly normal neurologically. Skin:  Intact without significant lesions or rashes.. Psych:  Alert and cooperative. Normal mood and affect.  Intake/Output from previous day: 12/13 0701 - 12/14 0700 In: 873.5 [I.V.:873.5] Out: 650 [Urine:650] Intake/Output this shift: No intake/output data recorded.  Lab Results: Recent Labs    12/20/16 1225 12/20/16 1318 12/21/16 0451  WBC 10.0  --  9.4  HGB 11.7* 12.6 10.4*  HCT 33.7* 37.0 30.1*  PLT  225  --  221   BMET Recent Labs    12/20/16 1225 12/20/16 1318 12/21/16 0451  NA 127* 128* 130*  K 5.0 4.8 4.4  CL 93* 94* 103  CO2 25  --  21*  GLUCOSE 127* 122* 109*  BUN 72* 61* 53*  CREATININE 1.60* 1.60* 1.32*  CALCIUM 9.4  --  8.3*   LFT Recent Labs    12/20/16 1225  PROT 6.4*  ALBUMIN 2.9*  AST 18  ALT 14  ALKPHOS 64  BILITOT 0.7   PT/INR No results for input(s): LABPROT, INR in the last 72 hours. Hepatitis Panel No results for input(s): HEPBSAG, HCVAB, HEPAIGM, HEPBIGM in the last 72 hours.    IMPRESSION:   #50 83 year old white female with chronic dysphagia primarily to solids with mild progression over the past several months and intermittent episodes requiring regurgitation. Patient has previously documented distal esophageal stricture and has had several esophageal dilations in the past, the last was done in 2010 at  which time she was separated dilated from 14-16 mm. She also has underlying significant esophageal dysmotility documented on barium swallow 2016.  Patient is able to eat, and has been consuming boost at home  #2 weight loss-multifactorial, at least in part due to chronic dysphagia #3 acute atrial flutter with RVR #4 congestive heart failure with EF 25 % #5 status post ICD #6 coronary artery disease status post prior MI and stents  Plan; I had a long talk with the patient and her granddaughter today. We reviewed reviewed findings of previous barium swallow and her last EGD.  She is very clearly not interested in having another endoscopy for esophageal dilation though I made this clear that it was a possibility  .She is at increased risk for complications due to her advanced age and other significant comorbidities.  I see no indication for a PEG tube as she is able to eat and drink though will do better if she alters her diet to full liquid to mechanical soft consistency. Patient is advised to avoid meats and bread. Family is advised to increase supplements to at least twice daily between meals to give her additional calories Patient needs to sit upright for meals and for an hour post meals Start Zantac 150 mg by mouth twice a day GI is available if there are questions or if we can help. Family will discuss potential EGD with the patient further and if they change their minds can follow-up with Dr. Silverio Decamp as an outpatient though family not inclined to proceed with EGD at this point either, and certainly this is very reasonable given her significant comorbidities.     Umeka Wrench  12/21/2016, 10:08 AM

## 2016-12-21 NOTE — Evaluation (Signed)
Physical Therapy Evaluation Patient Details Name: Tracy MontanaDorothy G Mennella MRN: [id number] DOB: 28-Sep-1921 Today's Date: 12/21/2016   History of Present Illness  83 year old female was admitted for back pain, decreased oral intake and worsening R hip pain, VT, Aflutter. Dx'd with recurrent utis.  H/o scoliosis, CHF, cardiomyopathy    Clinical Impression  On eval, pt required Min assist (+2 for safety) for mobility. She walked ~25 feet with a RW. HR up to 110s with activity during PT/OT evals. Pt participated well. She reported 8/10 R hip/LE pain with activity. Will follow and progress activity as tolerated. Recommend ST rehab at SNF if pt is agreeable. If pt refuses SNF and chooses to d/c home, recommend HHPT and 24 hour supervision/assist.     Follow Up Recommendations SNF    Equipment Recommendations  None recommended by PT    Recommendations for Other Services       Precautions / Restrictions Precautions Precautions: Fall Precaution Comments: monitor vitals Restrictions Weight Bearing Restrictions: No      Mobility  Bed Mobility Overal bed mobility: Needs Assistance Bed Mobility: Supine to Sit     Supine to sit: Min assist     General bed mobility comments: Increased time. Small amount of assist to get to EOB  Transfers Overall transfer level: Needs assistance Equipment used: Rolling walker (2 wheeled) Transfers: Sit to/from [employer]GI CorporationStand;Stand Pivot Transfers Sit to Stand: Min assist Stand pivot transfers: Min assist;+2 safety/equipment       General transfer comment: Assist to rise, stabilize, control descent. VCs safety, hand placement. Stand pivot, bed to recliner/recliner to bsc.   Ambulation/Gait Ambulation/Gait assistance: Min assist;+2 safety/equipment Ambulation Distance (Feet): 25 Feet Assistive device: Rolling walker (2 wheeled) Gait Pattern/deviations: Step-through pattern;Trunk flexed     General Gait Details: Assist to stabilize throughout ambulation. Cues  for safe/proper use of walker.   Stairs            Wheelchair Mobility    Modified Rankin (Stroke Patients Only)       Balance Overall balance assessment: History of Falls;Needs assistance         Standing balance support: Bilateral upper extremity supported Standing balance-Leahy Scale: Poor                               Pertinent Vitals/Pain Pain Assessment: 0-10 Faces Pain Scale: Hurts whole lot Pain Location: R hip/LE Pain Descriptors / Indicators: Grimacing;Discomfort Pain Intervention(s): Limited activity within patient's tolerance;Repositioned    Home Living Family/patient expects to be discharged to:: Private residence Living Arrangements: Alone Available Help at Discharge: Family Type of Home: House       Home Layout: Two level;Other (Comment)(elevator) Home Equipment: Shower seat;Walker - 2 wheels;Walker - 4 wheels      Prior Function Level of Independence: Needs assistance   Gait / Transfers Assistance Needed: mod ind with rollator for ambulation     Comments: daughter in law comes daily and helps as needed.      Hand Dominance        Extremity/Trunk Assessment   Upper Extremity Assessment Upper Extremity Assessment: Defer to OT evaluation    Lower Extremity Assessment Lower Extremity Assessment: Generalized weakness    Cervical / Trunk Assessment Cervical / Trunk Assessment: Kyphotic  Communication   Communication: HOH  Cognition Arousal/Alertness: Awake/alert Behavior During Therapy: WFL for tasks assessed/performed Overall Cognitive Status: Within Functional Limits for tasks assessed  General Comments      Exercises     Assessment/Plan    PT Assessment Patient needs continued PT services  PT Problem List Decreased strength;Decreased balance;Decreased mobility;Decreased activity tolerance;Decreased knowledge of use of DME;Pain       PT Treatment  Interventions DME instruction;Functional mobility training;Therapeutic activities;Therapeutic exercise;Balance training;Patient/family education;Gait training    PT Goals (Current goals can be found in the Care Plan section)  Acute Rehab PT Goals Patient Stated Goal: none stated; agreeable to working with therapy PT Goal Formulation: With patient Time For Goal Achievement: April 26, 2016 Potential to Achieve Goals: Good    Frequency Min 3X/week   Barriers to discharge Decreased caregiver support      Co-evaluation   Reason for Co-Treatment: For patient/therapist safety PT goals addressed during session: Mobility/safety with mobility OT goals addressed during session: ADL's and self-care       AM-PAC PT "6 Clicks" Daily Activity  Outcome Measure Difficulty turning over in bed (including adjusting bedclothes, sheets and blankets)?: A Lot Difficulty moving from lying on back to sitting on the side of the bed? : Unable Difficulty sitting down on and standing up from a chair with arms (e.g., wheelchair, bedside commode, etc,.)?: A Little Help needed moving to and from a bed to chair (including a wheelchair)?: A Little Help needed walking in hospital room?: A Little Help needed climbing 3-5 steps with a railing? : A Lot 6 Click Score: 14    End of Session Equipment Utilized During Treatment: Gait belt Activity Tolerance: Patient tolerated treatment well Patient left: in chair;with call bell/phone within reach;with chair alarm set;with family/visitor present   PT Visit Diagnosis: Muscle weakness (generalized) (M62.81);Pain;Difficulty in walking, not elsewhere classified (R26.2) Pain - Right/Left: Right Pain - part of body: Leg    Time: [id number] PT Time Calculation (min) (ACUTE ONLY): 47 min   Charges:   PT Evaluation $PT Eval Moderate Complexity: 1 Mod     PT G Codes:          Rebeca AlertJannie Dorann Davidson, MPT Pager: [phone]

## 2016-12-22 ENCOUNTER — Inpatient Hospital Stay (HOSPITAL_COMMUNITY): Payer: Medicare Other

## 2016-12-22 DIAGNOSIS — R0600 Dyspnea, unspecified: Secondary | ICD-10-CM

## 2016-12-22 LAB — BASIC METABOLIC PANEL
Anion gap: 4 — ABNORMAL LOW (ref 5–15)
BUN: 43 mg/dL — AB (ref 6–20)
CALCIUM: 8.8 mg/dL — AB (ref 8.9–10.3)
CO2: 25 mmol/L (ref 22–32)
CREATININE: 1.21 mg/dL — AB (ref 0.44–1.00)
Chloride: 104 mmol/L (ref 101–111)
GFR calc non Af Amer: 37 mL/min — ABNORMAL LOW (ref >60)
GFR, EST AFRICAN AMERICAN: 43 mL/min — AB (ref >60)
GLUCOSE: 108 mg/dL — AB (ref 65–99)
Potassium: 5 mmol/L (ref 3.5–5.1)
Sodium: 133 mmol/L — ABNORMAL LOW (ref 135–145)

## 2016-12-22 LAB — IRON AND TIBC
Iron: 18 ug/dL — ABNORMAL LOW (ref 28–170)
Saturation Ratios: 10 % — ABNORMAL LOW (ref 10.4–31.8)
TIBC: 183 ug/dL — ABNORMAL LOW (ref 250–450)
UIBC: 165 ug/dL

## 2016-12-22 LAB — RETICULOCYTES
RBC.: 3.64 MIL/uL — AB (ref 3.87–5.11)
RETIC COUNT ABSOLUTE: 32.8 10*3/uL (ref 19.0–186.0)
RETIC CT PCT: 0.9 % (ref 0.4–3.1)

## 2016-12-22 LAB — CBC
HEMATOCRIT: 33 % — AB (ref 36.0–46.0)
Hemoglobin: 11 g/dL — ABNORMAL LOW (ref 12.0–15.0)
MCH: 30.2 pg (ref 26.0–34.0)
MCHC: 33.3 g/dL (ref 30.0–36.0)
MCV: 90.7 fL (ref 78.0–100.0)
Platelets: 271 10*3/uL (ref 150–400)
RBC: 3.64 MIL/uL — ABNORMAL LOW (ref 3.87–5.11)
RDW: 13.3 % (ref 11.5–15.5)
WBC: 8.8 10*3/uL (ref 4.0–10.5)

## 2016-12-22 LAB — VITAMIN B12: Vitamin B-12: 433 pg/mL (ref 180–914)

## 2016-12-22 LAB — FERRITIN: FERRITIN: 140 ng/mL (ref 11–307)

## 2016-12-22 LAB — FOLATE: FOLATE: 34 ng/mL (ref >5.9)

## 2016-12-22 MED ORDER — METOPROLOL SUCCINATE ER 25 MG PO TB24
75.0000 mg | ORAL_TABLET | Freq: Two times a day (BID) | ORAL | Status: DC
  Administered 2016-12-22 – 2016-12-27 (×10): 75 mg via ORAL
  Filled 2016-12-22 (×3): qty 3
  Filled 2016-12-22: qty 1
  Filled 2016-12-22: qty 3
  Filled 2016-12-22: qty 1
  Filled 2016-12-22: qty 3
  Filled 2016-12-22: qty 1
  Filled 2016-12-22 (×2): qty 3
  Filled 2016-12-22: qty 1

## 2016-12-22 MED ORDER — METOPROLOL TARTRATE 5 MG/5ML IV SOLN
2.5000 mg | Freq: Four times a day (QID) | INTRAVENOUS | Status: DC | PRN
  Administered 2016-12-22 – 2016-12-23 (×3): 2.5 mg via INTRAVENOUS
  Filled 2016-12-22 (×3): qty 5

## 2016-12-22 MED ORDER — METOPROLOL SUCCINATE ER 25 MG PO TB24: 75.0000 mg | ORAL_TABLET | Freq: Two times a day (BID) | ORAL | Status: DC

## 2016-12-22 NOTE — Progress Notes (Signed)
Progress Note  Patient Name: [name] Date of Encounter: 12/22/2016  Primary Cardiologist: Dr. Sherryl Manges  Subjective   Feels weak, mildly short of breath and heart rate is elevated.  No chest pain.  Inpatient Medications    Scheduled Meds: . acetaminophen  1,000 mg Oral TID  . acidophilus  1 capsule Oral Daily  . amoxicillin  500 mg Oral Q12H  . aspirin EC  81 mg Oral Daily  . budesonide (PULMICORT) nebulizer solution  0.25 mg Nebulization BID  . enoxaparin (LOVENOX) injection  30 mg Subcutaneous Q24H  . feeding supplement (ENSURE ENLIVE)  237 mL Oral BID BM  . levothyroxine  100 mcg Oral QAC breakfast  . lidocaine  2 patch Transdermal Q24H  . metoprolol tartrate  50 mg Oral BID  . pravastatin  20 mg Oral Daily  . ranitidine  150 mg Oral BID    PRN Meds: acetaminophen **OR** acetaminophen, alum & mag hydroxide-simeth, diphenhydrAMINE, ondansetron **OR** ondansetron (ZOFRAN) IV, oxyCODONE, polyethylene glycol   Vital Signs    Vitals:   12/22/16 0300 12/22/16 0400 12/22/16 0423 12/22/16 0600  BP:  119/63  (!) 110/52  Pulse: 70 77  74  Resp: 14 20  14   Temp:   (!) 97.4 F (36.3 C)   TempSrc:   Oral   SpO2: 95% 91%  94%  Weight:   137 lb 9.1 oz (62.4 kg)     Intake/Output Summary (Last 24 hours) at 12/22/2016 0740 Last data filed at 12/22/2016 0600 Gross per 24 hour  Intake 1416.25 ml  Output 600 ml  Net 816.25 ml   Filed Weights   12/21/16 0400 12/22/16 0423  Weight: 125 lb 0 oz (56.7 kg) 137 lb 9.1 oz (62.4 kg)    Telemetry    Atrial flutter with 2:1 block and RVR.  Personally reviewed.  ECG    Patient received 12/21/2016 shows typical and RVR.[name]  Personally reviewed.  Physical Exam   GEN:  Elderly woman.  No acute distress.   Neck: No JVD. Cardiac:  Rapid RR, 2/6 systolic murmur, no gallop.  Respiratory: Nonlabored. Clear to auscultation bilaterally. GI: Soft, nontender, bowel sounds present. MS: No edema; kyphosis. Neuro:   Nonfocal. Psych: Alert and oriented x 3. Normal affect.  Labs    Chemistry Recent Labs  Lab 12/20/16 1225 12/20/16 1318 12/21/16 0451 12/22/16 0326  NA 127* 128* 130* 133*  K 5.0 4.8 4.4 5.0  CL 93* 94* 103 104  CO2 25  --  21* 25  GLUCOSE 127* 122* 109* 108*  BUN 72* 61* 53* 43*  CREATININE 1.60* 1.60* 1.32* 1.21*  CALCIUM 9.4  --  8.3* 8.8*  PROT 6.4*  --   --   --   ALBUMIN 2.9*  --   --   --   AST 18  --   --   --   ALT 14  --   --   --   ALKPHOS 64  --   --   --   BILITOT 0.7  --   --   --   GFRNONAA 27*  --  34* 37*  GFRAA 31*  --  39* 43*  ANIONGAP 9  --  6 4*     Hematology Recent Labs  Lab 12/20/16 1225 12/20/16 1318 12/21/16 0451 12/22/16 0326  WBC 10.0  --  9.4 8.8  RBC 3.82*  --  3.38* 3.64*  3.64*  HGB 11.7* 12.6 10.4* 11.0*  HCT 33.7* 37.0  30.1* 33.0*  MCV 88.2  --  89.1 90.7  MCH 30.6  --  30.8 30.2  MCHC 34.7  --  34.6 33.3  RDW 13.1  --  13.3 13.3  PLT 225  --  221 271    Cardiac EnzymesNo results for input(s): TROPONINI in the last 168 hours.  Recent Labs  Lab 12/20/16 1311  TROPIPOC 0.01     Radiology    Dg Chest 2 View  Result Date: 12/20/2016 CLINICAL DATA:  Congestive heart failure. EXAM: CHEST  2 VIEW COMPARISON:  Radiographs of July 27, 2016. FINDINGS: Stable cardiomediastinal silhouette. Atherosclerosis of thoracic aorta is noted. Stable elevated right hemidiaphragm is noted with mild right basilar subsegmental atelectasis. Stable large hiatal hernia is noted. Left-sided pacemaker is unchanged in position. Bony thorax is unremarkable. IMPRESSION: Aortic atherosclerosis. Stable elevated right hemidiaphragm with mild right basilar subsegmental atelectasis. Stable large hiatal hernia. Electronically Signed   By: Lupita RaiderJames  Green Jr, M.D.   On: 12/20/2016 16:35   Dg Lumbar Spine Complete  Result Date: 12/20/2016 CLINICAL DATA:  Acute on chronic low back pain after fall several weeks ago. EXAM: LUMBAR SPINE - COMPLETE 4+ VIEW  COMPARISON:  CT scan of December 05, 2016. Radiographs of July 28, 2016. FINDINGS: Severe levoscoliosis of lumbar spine is noted. Atherosclerosis of thoracic aorta is noted. No fracture or spondylolisthesis is noted. Severe degenerative disc disease is noted at all levels of the lumbar spine. IMPRESSION: Severe multilevel degenerative disc disease. Severe levoscoliosis of lumbar spine. No acute abnormality is noted. Electronically Signed   By: Lupita RaiderJames  Green Jr, M.D.   On: 12/20/2016 16:44   Ct Head Wo Contrast  Result Date: 12/20/2016 CLINICAL DATA:  Altered level of consciousness. EXAM: CT HEAD WITHOUT CONTRAST TECHNIQUE: Contiguous axial images were obtained from the base of the skull through the vertex without intravenous contrast. COMPARISON:  CT scan of September 27, 2015. FINDINGS: Brain: Mild diffuse cortical atrophy is noted. Mild chronic ischemic white matter disease is noted. No mass effect or midline shift is noted. Ventricular size is within normal limits. There is no evidence of mass lesion, hemorrhage or acute infarction. Vascular: No hyperdense vessel or unexpected calcification. Skull: Normal. Negative for fracture or focal lesion. Sinuses/Orbits: No acute finding. Other: None. IMPRESSION: Mild diffuse cortical atrophy. Mild chronic ischemic white matter disease. No acute intracranial abnormality seen. Electronically Signed   By: Lupita RaiderJames  Green Jr, M.D.   On: 12/20/2016 14:51   [place] Renal  Result Date: 12/21/2016 CLINICAL DATA:  Acute renal failure. EXAM: RENAL / URINARY TRACT ULTRASOUND COMPLETE COMPARISON:  CT 12/05/2016 FINDINGS: Right Kidney: Length: 13.0 cm. Moderate right hydronephrosis. Right renal pelvis measures 2.1 cm. Nonobstructing calculi on prior CT are not well seen sonographically. Left Kidney: Length: 11.2 cm. Echogenicity within normal limits. No mass or hydronephrosis visualized. Bladder: Not visualized.  Bladder stone on prior CT is not seen. IMPRESSION: 1. Persistent  moderate right hydronephrosis, similar to CT 2.5 weeks prior allowing for differences in modality. 2. Bilateral renal calculi on prior CT are not well seen sonographically. 3. Bladder is not visualized, presumably empty. Electronically Signed   By: Rubye OaksMelanie  Ehinger M.D.   On: 12/21/2016 18:40   Dg Hip Unilat W Or Wo Pelvis 2-3 Views Right  Result Date: 12/20/2016 CLINICAL DATA:  Patient fell several weeks ago with worsening right hip pain. EXAM: DG HIP (WITH OR WITHOUT PELVIS) 2-3V RIGHT COMPARISON:  07/28/2016 FINDINGS: Chronic stable joint space narrowing of both hips without fracture or  joint dislocation. The pubic rami appear intact. Phleboliths are noted within the pelvis. Tubing projects over the pubic symphysis. Levoconvex curvature of the visualized lower lumbar spine as before. The bony pelvis appears intact. Obturator rings appear intact. IMPRESSION: 1. Uniform bilateral joint space narrowing of the hips without acute osseous abnormality. 2. Levoscoliosis of the lower lumbar spine. Electronically Signed   By: Tollie Ethavid  Kwon M.D.   On: 12/20/2016 16:45    Cardiac Studies   Echocardiogram 12/21/2016: Study Conclusions  - Left ventricle: The cavity size was normal. Systolic function was   mildly reduced. The estimated ejection fraction was in the range   of 45% to 50%. Diffuse hypokinesis. - Ventricular septum: Septal motion showed abnormal function and   dyssynergy. - Aortic valve: Trileaflet; mildly thickened, mildly calcified   leaflets. - Mitral valve: Calcified annulus. There was moderate   regurgitation. - Left atrium: The atrium was severely dilated. - Right ventricle: Pacer wire or catheter noted in right ventricle. - Tricuspid valve: There was mild regurgitation. - Pulmonary arteries: Systolic pressure was mildly increased. PA   peak pressure: 44 mm Hg (S).  Impressions:  - Compared to the prior study, there has been no significant   interval change.  Patient Profile       83 y.o. female a history of CAD status post prior anterior wall infarct, stent interventions to the RCA in 2003 and circumflex in 2005, ischemic cardiomyopathy, CRT-P in place, hypertension, hyperlipidemia, hypothyroidism, paroxysmal VT, and left bundle branch block.  Patient presents with failure to thrive, acute renal insufficiency, acute anemia, and persistent typical atrial flutter.  Assessment & Plan    1.  Persistent, typical atrial flutter with RVR, largely 2:1 block at this time.  She was placed on Lopressor 50 mg twice daily yesterday, although was onToprol-XL 50 mg twice daily at home. CHADSVASC score is 6, but anticoagulation not planned in setting of fall risk, new onset anemia, and failure to thrive.  She is on low-dose aspirin.  2.  History of ischemic cardiomyopathy.  Echocardiogram shows LVEF 45-50% at this time.  3.  Acute renal insufficiency, creatinine down to 1.2.  4.  [employer] CRT-P in place, followed by Dr. Graciela HusbandsKlein.  5.  Apparent history of PAF/PAT (? flutter).  Device check from mid November indicated 33 mode switches with greater than 2 hours of AF in April.  I reviewed the chart and discussed the situation with the patient and her granddaughter at bedside.  Plan is to switch from Lopressor back to Toprol-XL at higher dose.  Would hold off on resuming Cozaar at this point, due to renal insufficiency at presentation and also to allow additional blood pressure for medication titration.  If heart rate cannot be controlled with beta-blocker alone, could still use low-dose calcium channel blocker. This was avoided initially with history of cardiomyopathy, but present LVEF 45-50% range does not preclude use.  Signed, Nona DellSamuel Kayline Sheer, MD   12/22/2016, 7:40 AM

## 2016-12-22 NOTE — Consult Note (Addendum)
Palliative Care Consultation Reason for Consultation: Symptom Management Req: TRH  Ms. [name] 83 [place]ib. She has been limited in her functional status at home due to severe hip and back pain- likely multifactorial related to severe degenerative back disease, OA, kidney stones and recurrent UTI/urinary retention. She has a strong will to live and to maintain her independence. Her goals are and have been to maintain her QOL and for comfort -she does not desire aggressive medical interventions but would like reversible illness treated if she can return to a good QOL. She wants to continue to stay at home and will pay for in home care givers if needed her grandaughter-in-law stays with her most of the day.She says that she has a nice home even has an elevator because over 50 years ago the state took and paid her family for the land and her previous [place].  Recommendations:  Pain control: Her pain is severe at times-symptoms often radiate down her right leg from her hip. I have reviewed imaging- severe degenerative dx and thoraco-lumbar kyphosis/compression. Difficult to know how much was from her kidney stones and UTI.   Scheduled TID Tylenol 1000mg  TID  Lidoderm patch to hip and low back  Oxycodone PRN for severe pain-has tolerated this well in the past  Treat her A.Fib - hopefully transition to oral rate control meds soon, get out of ICU asap to prevent delirium, maintain her moblity, OOB asap.  Maintain her mobility and functional status- encourage OOB.  Hopefully with tx of UTI and Adib she can get back to her baseline and wil batter pain control do well at home with help.   Will discuss discharge options with her depending on how she does.I discussed the concept of PC and hospice care helping at home.She is realistic about her age and function and ability to recover.  Time: 50 min Greater than  50%  of this time was spent counseling and coordinating care related to the above assessment and plan.  [name], DO Palliative Medicine [phone]

## 2016-12-22 NOTE — Progress Notes (Signed)
[facility] PROGRESS NOTE  [name] ZOX:[id number] DOB: 1921-08-10 DOA: 12/20/2016  PCP: Helane Rima, DO  Brief History/Interval Summary: 83 year old Caucasian female with a past medical history of chronic systolic CHF with an ejection fraction of 45-50%, esophageal stricture, hypothyroidism, pacemaker, recurrent UTI, history of nephrolithiasis, history of hydronephrosis recently started on amoxicillin by her urologist has been having worsening back pain hip pain decreased oral intake.  On evaluation patient was found to have atrial flutter with RVR.  Patient was hospitalized for further management.  Reason for Visit: Atrial flutter with RVR  Consultants: Cardiology.  Gastroenterology.  Procedures:  Transthoracic echocardiogram Study Conclusions  - Left ventricle: The cavity size was normal. Systolic function was   mildly reduced. The estimated ejection fraction was in the range   of 45% to 50%. Diffuse hypokinesis. - Ventricular septum: Septal motion showed abnormal function and   dyssynergy. - Aortic valve: Trileaflet; mildly thickened, mildly calcified   leaflets. - Mitral valve: Calcified annulus. There was moderate   regurgitation. - Left atrium: The atrium was severely dilated. - Right ventricle: Pacer wire or catheter noted in right ventricle. - Tricuspid valve: There was mild regurgitation. - Pulmonary arteries: Systolic pressure was mildly increased. PA   peak pressure: 44 mm Hg (S).  Impressions:  - Compared to the prior study, there has been no significant   interval change.  Antibiotics: Amoxacillin  Subjective/Interval History: Patient mentions that she has been feeling a little short of breath this morning but denies any chest pain per se.  No palpitations.  No nausea or vomiting.  She appears to be in good spirits.  Her granddaughter is at the bedside.    ROS: Denies any headaches  Objective:  Vital Signs  Vitals:   12/22/16  0300 12/22/16 0400 12/22/16 0423 12/22/16 0600  BP:  119/63  (!) 110/52  Pulse: 70 77  74  Resp: 14 20  14   Temp:   (!) 97.4 F (36.3 C)   TempSrc:   Oral   SpO2: 95% 91%  94%  Weight:   62.4 kg (137 lb 9.1 oz)     Intake/Output Summary (Last 24 hours) at 12/22/2016 0810 Last data filed at 12/22/2016 0600 Gross per 24 hour  Intake 1416.25 ml  Output 600 ml  Net 816.25 ml   Filed Weights   12/21/16 0400 12/22/16 0423  Weight: 56.7 kg (125 lb 0 oz) 62.4 kg (137 lb 9.1 oz)    General appearance: Awake alert.  Does not appear to be in any distress. Resp: Diminished air entry at the bases.  But no obvious wheezing or crackles heard on examination. Cardio: S1-S2 is irregularly irregular.  Tachycardic this morning.  Systolic murmur over the precordium. GI: Abdomen remains soft.  Nontender nondistended.  Bowel sounds are present.  No masses organomegaly Extremities: No edema Neurologic: No focal deficits  Lab Results:  Data Reviewed: I have personally reviewed following labs and imaging studies  CBC: Recent Labs  Lab 12/20/16 1225 12/20/16 1318 12/21/16 0451 12/22/16 0326  WBC 10.0  --  9.4 8.8  NEUTROABS 8.1*  --   --   --   HGB 11.7* 12.6 10.4* 11.0*  HCT 33.7* 37.0 30.1* 33.0*  MCV 88.2  --  89.1 90.7  PLT 225  --  221 271    Basic Metabolic Panel: Recent Labs  Lab 12/20/16 1225 12/20/16 1318 12/21/16 0451 12/22/16 0326  NA 127* 128* 130* 133*  K 5.0 4.8 4.4 5.0  CL 93* 94* 103 104  CO2 25  --  21* 25  GLUCOSE 127* 122* 109* 108*  BUN 72* 61* 53* 43*  CREATININE 1.60* 1.60* 1.32* 1.21*  CALCIUM 9.4  --  8.3* 8.8*    GFR: Estimated Creatinine Clearance: 22.7 mL/min (A) (by C-G formula based on SCr of 1.21 mg/dL (H)).  Liver Function Tests: Recent Labs  Lab 12/20/16 1225  AST 18  ALT 14  ALKPHOS 64  BILITOT 0.7  PROT 6.4*  ALBUMIN 2.9*    Thyroid Function Tests: Recent Labs    12/21/16 0451  TSH 5.483*    Anemia Panel: Recent Labs     12/22/16 0326  RETICCTPCT 0.9    Recent Results (from the past 240 hour(s))  MRSA PCR Screening     Status: None   Collection Time: 12/20/16  6:55 PM  Result Value Ref Range Status   MRSA by PCR NEGATIVE NEGATIVE Final    Comment:        The GeneXpert MRSA Assay (FDA approved for NASAL specimens only), is one component of a comprehensive MRSA colonization surveillance program. It is not intended to diagnose MRSA infection nor to guide or monitor treatment for MRSA infections.       Radiology Studies: Dg Chest 2 View  Result Date: 12/20/2016 CLINICAL DATA:  Congestive heart failure. EXAM: CHEST  2 VIEW COMPARISON:  Radiographs of July 27, 2016. FINDINGS: Stable cardiomediastinal silhouette. Atherosclerosis of thoracic aorta is noted. Stable elevated right hemidiaphragm is noted with mild right basilar subsegmental atelectasis. Stable large hiatal hernia is noted. Left-sided pacemaker is unchanged in position. Bony thorax is unremarkable. IMPRESSION: Aortic atherosclerosis. Stable elevated right hemidiaphragm with mild right basilar subsegmental atelectasis. Stable large hiatal hernia. Electronically Signed   By: Lupita Raider, M.D.   On: 12/20/2016 16:35   Dg Lumbar Spine Complete  Result Date: 12/20/2016 CLINICAL DATA:  Acute on chronic low back pain after fall several weeks ago. EXAM: LUMBAR SPINE - COMPLETE 4+ VIEW COMPARISON:  CT scan of December 05, 2016. Radiographs of July 28, 2016. FINDINGS: Severe levoscoliosis of lumbar spine is noted. Atherosclerosis of thoracic aorta is noted. No fracture or spondylolisthesis is noted. Severe degenerative disc disease is noted at all levels of the lumbar spine. IMPRESSION: Severe multilevel degenerative disc disease. Severe levoscoliosis of lumbar spine. No acute abnormality is noted. Electronically Signed   By: Lupita Raider, M.D.   On: 12/20/2016 16:44   Ct Head Wo Contrast  Result Date: 12/20/2016 CLINICAL DATA:  Altered level  of consciousness. EXAM: CT HEAD WITHOUT CONTRAST TECHNIQUE: Contiguous axial images were obtained from the base of the skull through the vertex without intravenous contrast. COMPARISON:  CT scan of September 27, 2015. FINDINGS: Brain: Mild diffuse cortical atrophy is noted. Mild chronic ischemic white matter disease is noted. No mass effect or midline shift is noted. Ventricular size is within normal limits. There is no evidence of mass lesion, hemorrhage or acute infarction. Vascular: No hyperdense vessel or unexpected calcification. Skull: Normal. Negative for fracture or focal lesion. Sinuses/Orbits: No acute finding. Other: None. IMPRESSION: Mild diffuse cortical atrophy. Mild chronic ischemic white matter disease. No acute intracranial abnormality seen. Electronically Signed   By: Lupita Raider, M.D.   On: 12/20/2016 14:51   US Renal  Result Date: 12/21/2016 CLINICAL DATA:  Acute renal failure. EXAM: RENAL / URINARY TRACT ULTRASOUND COMPLETE COMPARISON:  CT 12/05/2016 FINDINGS: Right Kidney: Length: 13.0 cm. Moderate right hydronephrosis. Right renal pelvis  measures 2.1 cm. Nonobstructing calculi on prior CT are not well seen sonographically. Left Kidney: Length: 11.2 cm. Echogenicity within normal limits. No mass or hydronephrosis visualized. Bladder: Not visualized.  Bladder stone on prior CT is not seen. IMPRESSION: 1. Persistent moderate right hydronephrosis, similar to CT 2.5 weeks prior allowing for differences in modality. 2. Bilateral renal calculi on prior CT are not well seen sonographically. 3. Bladder is not visualized, presumably empty. Electronically Signed   By: Rubye OaksMelanie  Ehinger M.D.   On: 12/21/2016 18:40   Dg Hip Unilat W Or Wo Pelvis 2-3 Views Right  Result Date: 12/20/2016 CLINICAL DATA:  Patient fell several weeks ago with worsening right hip pain. EXAM: DG HIP (WITH OR WITHOUT PELVIS) 2-3V RIGHT COMPARISON:  07/28/2016 FINDINGS: Chronic stable joint space narrowing of both hips  without fracture or joint dislocation. The pubic rami appear intact. Phleboliths are noted within the pelvis. Tubing projects over the pubic symphysis. Levoconvex curvature of the visualized lower lumbar spine as before. The bony pelvis appears intact. Obturator rings appear intact. IMPRESSION: 1. Uniform bilateral joint space narrowing of the hips without acute osseous abnormality. 2. Levoscoliosis of the lower lumbar spine. Electronically Signed   By: Tollie Ethavid  Kwon M.D.   On: 12/20/2016 16:45     Medications:  Scheduled: . acetaminophen  1,000 mg Oral TID  . acidophilus  1 capsule Oral Daily  . amoxicillin  500 mg Oral Q12H  . aspirin EC  81 mg Oral Daily  . budesonide (PULMICORT) nebulizer solution  0.25 mg Nebulization BID  . enoxaparin (LOVENOX) injection  30 mg Subcutaneous Q24H  . feeding supplement (ENSURE ENLIVE)  237 mL Oral BID BM  . levothyroxine  100 mcg Oral QAC breakfast  . lidocaine  2 patch Transdermal Q24H  . metoprolol succinate  75 mg Oral BID  . pravastatin  20 mg Oral Daily  . ranitidine  150 mg Oral BID   Continuous:  ZOX:[id number]:acetaminophen **OR** acetaminophen, alum & mag hydroxide-simeth, diphenhydrAMINE, ondansetron **OR** ondansetron (ZOFRAN) IV, oxyCODONE, polyethylene glycol  Assessment/Plan:  Active Problems:   Paroxysmal ventricular tachycardia (HCC)   History of recurrent UTIs   Malnutrition of moderate degree   Tachycardia   AKI (acute kidney injury) (HCC)   Dysphagia    Atrial flutter with RVR Patient was initially placed on a diltiazem infusion.  Heart rate was better controlled.  Patient transitioned to oral metoprolol yesterday.  Heart rate in the 120s this morning.  Patient does not appear to be in any distress.  Discussed with cardiology.  They have increase the dose of her metoprolol.  Patient will also be given IV metoprolol as needed.  Wait for response.  Wait for better control of her heart rate.  Echocardiogram is as above.  Systolic function  about 45%..  Continue aspirin. Patient not considered a good candidate for anticoagulation by cardiology due to previous falls, frailty.   Acute kidney injury/hyponatremia Patient's creatinine previously noted to be normal.  Creatinine noted to be elevated at the time of admission up to 1.6.  She was also found to have hyponatremia.  Patient has had poor oral intake the last few days.  She was thought to be dehydrated.  Her diuretics were held.  She was gently hydrated.  Her creatinine continues to improve.  It is down to 1.2 this morning.  Sodium levels have improved to 133.  Renal ultrasound shows persistent right-sided hydronephrosis as previously seen.  At this point in time as long as patient  continues to make urine we will not do any further workup.  She can follow-up with her urologist as an outpatient..  Chronic systolic CHF/history of coronary artery disease Lasix was held due to hypotension and worsening renal function.  Echocardiogram shows EF to be around 45%.  She also has pacemaker.  Patient mentioned dyspnea this morning.  Lungs appear to be mostly clear.  Chest x-ray shows atelectasis.  Incentive spirometry for now.  IV fluids have been discontinued.  Continue to monitor for now.  May have to resume Lasix soon.   History of hypothyroidism Continue Synthroid.  TSH noted to be slightly elevated.  We would recommend that this be rechecked in the next 2-3 weeks.  No changes to medications at this time.  History of recurrent UTI and hydronephrosis Recently seen by urology.  Started on amoxicillin which will be continued.  Stable right-sided hydronephrosis seen on ultrasound.  Severe protein calorie malnutrition/physical deconditioning PT and OT evaluation.  Encourage oral intake.  Dysphagia most likely esophageal Patient with the history of esophageal stricture and hiatal hernia.  Previously has had esophageal dilatation.  Seen by gastroenterology yesterday.  They have had discussions  with patient and family.  Patient elected not to undergo any procedures.  Continue soft/liquid diet.   Chronic back pain and hip pain This is secondary to degenerative disease and disc disease.  No recent falls or injuries.  No injuries identified on imaging studies.  Normocytic anemia Monitor hemoglobin closely.  No evidence for overt blood loss.  Ferritin noted to be 140.  Vitamin B12 433.  Folate 34.   DVT Prophylaxis: Lovenox    Code Status: DNR Family Communication: Discussed with the patient and her granddaughter Disposition Plan: Management as outlined above.    LOS: 2 days   Osvaldo ShipperGokul Alivea Gladson  [facility] Pager [phone] 12/22/2016, 8:10 AM  If 7PM-7AM, please contact night-coverage at [email or web address], password [email or web address]

## 2016-12-23 DIAGNOSIS — J810 Acute pulmonary edema: Secondary | ICD-10-CM

## 2016-12-23 LAB — CBC
HCT: 34.8 % — ABNORMAL LOW (ref 36.0–46.0)
HEMOGLOBIN: 11.7 g/dL — AB (ref 12.0–15.0)
MCH: 30.5 pg (ref 26.0–34.0)
MCHC: 33.6 g/dL (ref 30.0–36.0)
MCV: 90.6 fL (ref 78.0–100.0)
Platelets: 315 10*3/uL (ref 150–400)
RBC: 3.84 MIL/uL — ABNORMAL LOW (ref 3.87–5.11)
RDW: 13.5 % (ref 11.5–15.5)
WBC: 12.1 10*3/uL — ABNORMAL HIGH (ref 4.0–10.5)

## 2016-12-23 LAB — BASIC METABOLIC PANEL
Anion gap: 5 (ref 5–15)
BUN: 37 mg/dL — AB (ref 6–20)
CHLORIDE: 105 mmol/L (ref 101–111)
CO2: 24 mmol/L (ref 22–32)
CREATININE: 1.23 mg/dL — AB (ref 0.44–1.00)
Calcium: 9.1 mg/dL (ref 8.9–10.3)
GFR calc Af Amer: 42 mL/min — ABNORMAL LOW (ref >60)
GFR calc non Af Amer: 37 mL/min — ABNORMAL LOW (ref >60)
GLUCOSE: 111 mg/dL — AB (ref 65–99)
Potassium: 5.4 mmol/L — ABNORMAL HIGH (ref 3.5–5.1)
SODIUM: 134 mmol/L — AB (ref 135–145)

## 2016-12-23 MED ORDER — FUROSEMIDE 10 MG/ML IJ SOLN: 40.0000 mg | Freq: Once | INTRAMUSCULAR | Status: AC

## 2016-12-23 MED ORDER — DILTIAZEM HCL 30 MG PO TABS: 30.0000 mg | ORAL_TABLET | Freq: Four times a day (QID) | ORAL | Status: DC

## 2016-12-23 MED ORDER — FUROSEMIDE 10 MG/ML IJ SOLN
INTRAMUSCULAR | Status: AC
  Administered 2016-12-23: 08:00:00
  Filled 2016-12-23: qty 4

## 2016-12-23 MED ORDER — FUROSEMIDE 10 MG/ML IJ SOLN
20.0000 mg | Freq: Once | INTRAMUSCULAR | Status: AC
  Administered 2016-12-23: 20 mg via INTRAVENOUS
  Filled 2016-12-23: qty 2

## 2016-12-23 MED ORDER — DILTIAZEM HCL 30 MG PO TABS
30.0000 mg | ORAL_TABLET | Freq: Four times a day (QID) | ORAL | Status: AC
  Administered 2016-12-23 – 2016-12-26 (×13): 30 mg via ORAL
  Filled 2016-12-23 (×13): qty 1

## 2016-12-23 MED ORDER — SODIUM POLYSTYRENE SULFONATE 15 GM/60ML PO SUSP
15.0000 g | Freq: Once | ORAL | Status: AC
  Administered 2016-12-23: 15 g via ORAL
  Filled 2016-12-23: qty 60

## 2016-12-23 MED ORDER — PSYLLIUM 95 % PO PACK
1.0000 | PACK | Freq: Every day | ORAL | Status: DC
  Administered 2016-12-23 – 2016-12-27 (×5): 1 via ORAL
  Filled 2016-12-23 (×5): qty 1

## 2016-12-23 NOTE — Progress Notes (Signed)
[facility] PROGRESS NOTE  [name] ZOX:[id number] DOB: 12/12/1921 DOA: 12/20/2016  PCP: Helane Rima, DO  Brief History/Interval Summary: 83 year old Caucasian female with a past medical history of chronic systolic CHF with an ejection fraction of 45-50%, esophageal stricture, hypothyroidism, pacemaker, recurrent UTI, history of nephrolithiasis, history of hydronephrosis recently started on amoxicillin by her urologist, has been having worsening back pain hip pain decreased oral intake.  On evaluation patient was found to have atrial flutter with RVR.  Patient was hospitalized for further management.  Reason for Visit: Atrial flutter with RVR  Consultants: Cardiology.  Gastroenterology.  Procedures:  Transthoracic echocardiogram Study Conclusions  - Left ventricle: The cavity size was normal. Systolic function was   mildly reduced. The estimated ejection fraction was in the range   of 45% to 50%. Diffuse hypokinesis. - Ventricular septum: Septal motion showed abnormal function and   dyssynergy. - Aortic valve: Trileaflet; mildly thickened, mildly calcified   leaflets. - Mitral valve: Calcified annulus. There was moderate   regurgitation. - Left atrium: The atrium was severely dilated. - Right ventricle: Pacer wire or catheter noted in right ventricle. - Tricuspid valve: There was mild regurgitation. - Pulmonary arteries: Systolic pressure was mildly increased. PA   peak pressure: 44 mm Hg (S).  Impressions:  - Compared to the prior study, there has been no significant   interval change.  Antibiotics: Amoxacillin  Subjective/Interval History: Patient developed worsening shortness of breath overnight.  Some chest tightness this morning.  Denies any nausea vomiting.  Her family is at the bedside.    ROS: Denies any headaches.  Objective:  Vital Signs  Vitals:   12/22/16 1941 12/22/16 2000 12/23/16 0031 12/23/16 0400  BP:      Pulse:      Resp:       Temp: 98.3 F (36.8 C) 98.3 F (36.8 C) 98 F (36.7 C) 97.9 F (36.6 C)  TempSrc: Oral Oral Axillary Oral  SpO2:  94%    Weight:      Height:       No intake or output data in the 24 hours ending 12/23/16 0750 Filed Weights   12/21/16 0400 12/22/16 0423 12/22/16 1230  Weight: 56.7 kg (125 lb 0 oz) 62.4 kg (137 lb 9.1 oz) 62 kg (136 lb 11 oz)    General appearance: Awake alert.  Appears to be in discomfort but no distress. Resp: Crackles noted bilaterally today.  No wheezing or rhonchi.  She is clearly tachypneic even at rest.   Cardio: S1-S2 remains irregularly irregular and tachycardic.  Systolic murmur over the precordium.   GI: Abdomen remains soft.  Nontender nondistended.  Bowel sounds are present. Extremities: No edema Neurologic: No focal deficits  Lab Results:  Data Reviewed: I have personally reviewed following labs and imaging studies  CBC: Recent Labs  Lab 12/20/16 1225 12/20/16 1318 12/21/16 0451 12/22/16 0326 12/23/16 0327  WBC 10.0  --  9.4 8.8 12.1*  NEUTROABS 8.1*  --   --   --   --   HGB 11.7* 12.6 10.4* 11.0* 11.7*  HCT 33.7* 37.0 30.1* 33.0* 34.8*  MCV 88.2  --  89.1 90.7 90.6  PLT 225  --  221 271 315    Basic Metabolic Panel: Recent Labs  Lab 12/20/16 1225 12/20/16 1318 12/21/16 0451 12/22/16 0326 12/23/16 0327  NA 127* 128* 130* 133* 134*  K 5.0 4.8 4.4 5.0 5.4*  CL 93* 94* 103 104 105  CO2 25  --  21* 25 24  GLUCOSE 127* 122* 109* 108* 111*  BUN 72* 61* 53* 43* 37*  CREATININE 1.60* 1.60* 1.32* 1.21* 1.23*  CALCIUM 9.4  --  8.3* 8.8* 9.1    GFR: Estimated Creatinine Clearance: 23.5 mL/min (A) (by C-G formula based on SCr of 1.23 mg/dL (H)).  Liver Function Tests: Recent Labs  Lab 12/20/16 1225  AST 18  ALT 14  ALKPHOS 64  BILITOT 0.7  PROT 6.4*  ALBUMIN 2.9*    Thyroid Function Tests: Recent Labs    12/21/16 0451  TSH 5.483*    Anemia Panel: Recent Labs    12/22/16 0326  VITAMINB12 433  FOLATE 34.0    FERRITIN 140  TIBC 183*  IRON 18*  RETICCTPCT 0.9    Recent Results (from the past 240 hour(s))  MRSA PCR Screening     Status: None   Collection Time: 12/20/16  6:55 PM  Result Value Ref Range Status   MRSA by PCR NEGATIVE NEGATIVE Final    Comment:        The GeneXpert MRSA Assay (FDA approved for NASAL specimens only), is one component of a comprehensive MRSA colonization surveillance program. It is not intended to diagnose MRSA infection nor to guide or monitor treatment for MRSA infections.       Radiology Studies: [place] Renal  Result Date: 12/21/2016 CLINICAL DATA:  Acute renal failure. EXAM: RENAL / URINARY TRACT ULTRASOUND COMPLETE COMPARISON:  CT 12/05/2016 FINDINGS: Right Kidney: Length: 13.0 cm. Moderate right hydronephrosis. Right renal pelvis measures 2.1 cm. Nonobstructing calculi on prior CT are not well seen sonographically. Left Kidney: Length: 11.2 cm. Echogenicity within normal limits. No mass or hydronephrosis visualized. Bladder: Not visualized.  Bladder stone on prior CT is not seen. IMPRESSION: 1. Persistent moderate right hydronephrosis, similar to CT 2.5 weeks prior allowing for differences in modality. 2. Bilateral renal calculi on prior CT are not well seen sonographically. 3. Bladder is not visualized, presumably empty. Electronically Signed   By: Rubye OaksMelanie  Ehinger M.D.   On: 12/21/2016 18:40   Dg Chest Port 1 View  Result Date: 12/22/2016 CLINICAL DATA:  Dyspnea EXAM: PORTABLE CHEST 1 VIEW COMPARISON:  12/20/2016 FINDINGS: Cardiac shadow is stable. Aortic calcifications are noted. Defibrillator is again seen. Elevation the right hemidiaphragm is again noted. Mild bibasilar atelectatic changes are seen. Hiatal hernia is again noted. IMPRESSION: Bibasilar atelectatic changes. Electronically Signed   By: Alcide CleverMark  Lukens M.D.   On: 12/22/2016 08:49     Medications:  Scheduled: . acetaminophen  1,000 mg Oral TID  . acidophilus  1 capsule Oral Daily  .  amoxicillin  500 mg Oral Q12H  . aspirin EC  81 mg Oral Daily  . budesonide (PULMICORT) nebulizer solution  0.25 mg Nebulization BID  . diltiazem  30 mg Oral Q6H  . enoxaparin (LOVENOX) injection  30 mg Subcutaneous Q24H  . feeding supplement (ENSURE ENLIVE)  237 mL Oral BID BM  . furosemide  40 mg Intravenous Once  . levothyroxine  100 mcg Oral QAC breakfast  . lidocaine  2 patch Transdermal Q24H  . metoprolol succinate  75 mg Oral BID  . pravastatin  20 mg Oral Daily  . ranitidine  150 mg Oral BID  . sodium polystyrene  15 g Oral Once   Continuous:  WUJ:[id number]:acetaminophen **OR** acetaminophen, alum & mag hydroxide-simeth, diphenhydrAMINE, metoprolol tartrate, ondansetron **OR** ondansetron (ZOFRAN) IV, oxyCODONE, polyethylene glycol  Assessment/Plan:  Active Problems:   Paroxysmal ventricular tachycardia (HCC)   History of  recurrent UTIs   Malnutrition of moderate degree   Tachycardia   AKI (acute kidney injury) (HCC)   Dysphagia    Atrial flutter with RVR Patient was initially placed on a diltiazem infusion.  Heart rate was better controlled.  Patient transitioned to oral metoprolol.  Heart rate remains poorly controlled.  Cardiology is following.  Her dose of metoprolol was increased on 12/15.  It appears that Cardizem has been added to her regimen today.  Defer management to cardiology. Echocardiogram is as above.  Systolic function about 45%.  Continue aspirin. Patient not considered a good candidate for anticoagulation by cardiology due to previous falls, frailty.   Dyspnea/acute pulmonary edema The patient has crackles on examination.  She is likely fluid overloaded.  She does have systolic CHF as discussed below.  She will be given Lasix and will be monitored closely.  Acute on chronic systolic CHF/history of coronary artery disease Initially Lasix was held due to hypotension and worsening renal function.  However as discussed above she is more dyspneic this morning and  crackles in both lungs.  She will be given Lasix this morning and will be monitored closely.  Foley catheter to be place for strict ins and outs.  Echocardiogram shows EF to be around 45%.  She also has pacemaker.    Acute kidney injury/hyponatremia Patient's creatinine previously noted to be normal.  Creatinine noted to be elevated at the time of admission up to 1.6.  She was also found to have hyponatremia.  Patient has had poor oral intake the last few days.  She was thought to be dehydrated.  Her diuretics were held.  She was gently hydrated.  Creatinine improved.  Creatinine stable around 1.2 this morning.  We may have to tolerate higher creatinine in this patient. Renal ultrasound shows persistent right-sided hydronephrosis as previously seen.  At this point in time as long as patient continues to make urine we will not do any further workup.  She can follow-up with her urologist as an outpatient.  Sodium level has improved to 134.  Potassium noted to be elevated today.  Should improve with Lasix and we will also give her a small dose of Kayexalate.  History of hypothyroidism Continue Synthroid.  TSH noted to be slightly elevated.  We would recommend that this be rechecked in the next 2-3 weeks.  No changes to medications at this time.  History of recurrent UTI and hydronephrosis Recently seen by urology.  Started on amoxicillin which will be continued.  Stable right-sided hydronephrosis seen on ultrasound.  Severe protein calorie malnutrition/physical deconditioning PT and OT evaluation.  Encourage oral intake.  Dysphagia most likely esophageal Patient with the history of esophageal stricture and hiatal hernia.  Previously has had esophageal dilatation.  Seen by gastroenterology during this hospitalization.  They have had discussions with patient and family.  Patient elected not to undergo any procedures.  Continue soft/liquid diet.   Chronic back pain and hip pain This is secondary to  degenerative disease and disc disease.  No recent falls or injuries.  No injuries identified on imaging studies.  Normocytic anemia Monitor hemoglobin closely.  No evidence for overt blood loss.  Ferritin noted to be 140.  Vitamin B12 433.  Folate 34.  Goals of care Palliative medicine was consulted and they have seen the patient.  Currently plan is to pursue medical treatment.  However if patient does not improve as anticipated or has setbacks then this may have to be revisited. She is  DNR.  DVT Prophylaxis: Lovenox    Code Status: DNR Family Communication: Discussed with the patient and her family Disposition Plan: She will remain in stepdown for now.  Continue management as mentioned above.    LOS: 3 days   Osvaldo ShipperGokul Tenaya Hilyer  [facility] Pager [phone] 12/23/2016, 7:50 AM  If 7PM-7AM, please contact night-coverage at [email or web address], password [email or web address]

## 2016-12-23 NOTE — Progress Notes (Signed)
Progress Note  Patient Name: [name] Date of Encounter: 12/23/2016  Primary Cardiologist: Dr. Sherryl MangesSteven Klein  Subjective   Feels short of breath this morning with elevated heart rate. No chest pain. Somewhat anxious and weak.  Inpatient Medications    Scheduled Meds: . acetaminophen  1,000 mg Oral TID  . acidophilus  1 capsule Oral Daily  . amoxicillin  500 mg Oral Q12H  . aspirin EC  81 mg Oral Daily  . budesonide (PULMICORT) nebulizer solution  0.25 mg Nebulization BID  . enoxaparin (LOVENOX) injection  30 mg Subcutaneous Q24H  . feeding supplement (ENSURE ENLIVE)  237 mL Oral BID BM  . levothyroxine  100 mcg Oral QAC breakfast  . lidocaine  2 patch Transdermal Q24H  . metoprolol succinate  75 mg Oral BID  . pravastatin  20 mg Oral Daily  . ranitidine  150 mg Oral BID    PRN Meds: acetaminophen **OR** acetaminophen, alum & mag hydroxide-simeth, diphenhydrAMINE, metoprolol tartrate, ondansetron **OR** ondansetron (ZOFRAN) IV, oxyCODONE, polyethylene glycol   Vital Signs    Vitals:   12/22/16 1941 12/22/16 2000 12/23/16 0031 12/23/16 0400  BP:      Pulse:      Resp:      Temp: 98.3 F (36.8 C) 98.3 F (36.8 C) 98 F (36.7 C) 97.9 F (36.6 C)  TempSrc: Oral Oral Axillary Oral  SpO2:  94%    Weight:      Height:       No intake or output data in the 24 hours ending 12/23/16 0706 Filed Weights   12/21/16 0400 12/22/16 0423 12/22/16 1230  Weight: 125 lb 0 oz (56.7 kg) 137 lb 9.1 oz (62.4 kg) 136 lb 11 oz (62 kg)    Telemetry    Typical atrial flutter currently with 2:1 block. Personally reviewed.  Physical Exam   GEN:  Frail appearing elderly woman. No acute distress.   Neck: No JVD. Cardiac:  Rapid regular rate, 2/6 murmur, no gallop.  Respiratory: Nonlabored. No wheezing. GI: Soft, nontender, bowel sounds present. MS: No edema; No deformity. Neuro:  Nonfocal. Psych: Alert and oriented x 3. Normal affect.  Labs    Chemistry Recent Labs    Lab 12/20/16 1225  12/21/16 0451 12/22/16 0326 12/23/16 0327  NA 127*   < > 130* 133* 134*  K 5.0   < > 4.4 5.0 5.4*  CL 93*   < > 103 104 105  CO2 25  --  21* 25 24  GLUCOSE 127*   < > 109* 108* 111*  BUN 72*   < > 53* 43* 37*  CREATININE 1.60*   < > 1.32* 1.21* 1.23*  CALCIUM 9.4  --  8.3* 8.8* 9.1  PROT 6.4*  --   --   --   --   ALBUMIN 2.9*  --   --   --   --   AST 18  --   --   --   --   ALT 14  --   --   --   --   ALKPHOS 64  --   --   --   --   BILITOT 0.7  --   --   --   --   GFRNONAA 27*  --  34* 37* 37*  GFRAA 31*  --  39* 43* 42*  ANIONGAP 9  --  6 4* 5   < > = values in this interval not displayed.  Hematology Recent Labs  Lab 12/21/16 0451 12/22/16 0326 12/23/16 0327  WBC 9.4 8.8 12.1*  RBC 3.38* 3.64*  3.64* 3.84*  HGB 10.4* 11.0* 11.7*  HCT 30.1* 33.0* 34.8*  MCV 89.1 90.7 90.6  MCH 30.8 30.2 30.5  MCHC 34.6 33.3 33.6  RDW 13.3 13.3 13.5  PLT 221 271 315    Cardiac EnzymesNo results for input(s): TROPONINI in the last 168 hours.  Recent Labs  Lab 12/20/16 1311  TROPIPOC 0.01     Radiology    US Renal  Result Date: 12/21/2016 CLINICAL DATA:  Acute renal failure. EXAM: RENAL / URINARY TRACT ULTRASOUND COMPLETE COMPARISON:  CT 12/05/2016 FINDINGS: Right Kidney: Length: 13.0 cm. Moderate right hydronephrosis. Right renal pelvis measures 2.1 cm. Nonobstructing calculi on prior CT are not well seen sonographically. Left Kidney: Length: 11.2 cm. Echogenicity within normal limits. No mass or hydronephrosis visualized. Bladder: Not visualized.  Bladder stone on prior CT is not seen. IMPRESSION: 1. Persistent moderate right hydronephrosis, similar to CT 2.5 weeks prior allowing for differences in modality. 2. Bilateral renal calculi on prior CT are not well seen sonographically. 3. Bladder is not visualized, presumably empty. Electronically Signed   By: Rubye Oaks M.D.   On: 12/21/2016 18:40   Dg Chest Port 1 View  Result Date:  12/22/2016 CLINICAL DATA:  Dyspnea EXAM: PORTABLE CHEST 1 VIEW COMPARISON:  12/20/2016 FINDINGS: Cardiac shadow is stable. Aortic calcifications are noted. Defibrillator is again seen. Elevation the right hemidiaphragm is again noted. Mild bibasilar atelectatic changes are seen. Hiatal hernia is again noted. IMPRESSION: Bibasilar atelectatic changes. Electronically Signed   By: Alcide Clever M.D.   On: 12/22/2016 08:49    Cardiac Studies   Echocardiogram 12/21/2016: Study Conclusions  - Left ventricle: The cavity size was normal. Systolic function was   mildly reduced. The estimated ejection fraction was in the range   of 45% to 50%. Diffuse hypokinesis. - Ventricular septum: Septal motion showed abnormal function and   dyssynergy. - Aortic valve: Trileaflet; mildly thickened, mildly calcified   leaflets. - Mitral valve: Calcified annulus. There was moderate   regurgitation. - Left atrium: The atrium was severely dilated. - Right ventricle: Pacer wire or catheter noted in right ventricle. - Tricuspid valve: There was mild regurgitation. - Pulmonary arteries: Systolic pressure was mildly increased. PA   peak pressure: 44 mm Hg (S).  Impressions:  - Compared to the prior study, there has been no significant   interval change.  Patient Profile     83 y.o. female with a history of CAD status post prior anterior wall infarct, stent interventions to the RCA in 2003 and circumflex in 2005, ischemic cardiomyopathy, CRT-P in place, hypertension, hyperlipidemia, hypothyroidism, paroxysmal VT, and left bundle branch block.  Patient presents with failure to thrive, acute renal insufficiency, acute anemia, and persistent typical atrial flutter.  Assessment & Plan    1. Persistent, typical atrial flutter. Still with RVR despite increase in Toprol-XL dosing. CHADSVASC score 6, but she is not anticoagulated in light of increased fall risk, recent anemia, and failure to thrive. She is on low-dose  aspirin.  2. Ischemic cardiomyopathy. LVEF recently documented in the range of 45-50%.  3. St. Jude CRT-P, followed by Dr. Graciela Husbands.  4. Acute renal insufficiency, creatinine stable at 1.2.  5. History of PAF/PAT based on device interrogations. Denies check from mid-November indicated 33 mode switches with greater than 2 hours of AF in April.  Continue Toprol-XL and add Cardizem 30 mg by  mouth every 6 hours. Continue to titrate as needed. In general, heart rate control of atrial flutter tends to be more challenging than atrial fibrillation. With pacemaker in place, should be able to advance medication doses adequately. Continue aspirin. Hold off on Cozaar.  Signed, Nona DellSamuel Javi Bollman, MD  12/23/2016, 7:06 AM

## 2016-12-23 NOTE — Plan of Care (Signed)
  Safety: Ability to remain free from injury will improve 12/23/2016 2326 - Progressing by Antionette CharPeng, Fayola Meckes P, RN

## 2016-12-24 ENCOUNTER — Inpatient Hospital Stay (HOSPITAL_COMMUNITY): Payer: Medicare Other

## 2016-12-24 DIAGNOSIS — I4892 Unspecified atrial flutter: Secondary | ICD-10-CM

## 2016-12-24 DIAGNOSIS — I420 Dilated cardiomyopathy: Secondary | ICD-10-CM

## 2016-12-24 DIAGNOSIS — I2510 Atherosclerotic heart disease of native coronary artery without angina pectoris: Secondary | ICD-10-CM

## 2016-12-24 DIAGNOSIS — I5021 Acute systolic (congestive) heart failure: Secondary | ICD-10-CM

## 2016-12-24 LAB — CBC
HEMATOCRIT: 33.9 % — AB (ref 36.0–46.0)
Hemoglobin: 11.6 g/dL — ABNORMAL LOW (ref 12.0–15.0)
MCH: 30.4 pg (ref 26.0–34.0)
MCHC: 34.2 g/dL (ref 30.0–36.0)
MCV: 89 fL (ref 78.0–100.0)
Platelets: 332 10*3/uL (ref 150–400)
RBC: 3.81 MIL/uL — ABNORMAL LOW (ref 3.87–5.11)
RDW: 13.4 % (ref 11.5–15.5)
WBC: 11.8 10*3/uL — ABNORMAL HIGH (ref 4.0–10.5)

## 2016-12-24 LAB — BASIC METABOLIC PANEL
Anion gap: 7 (ref 5–15)
BUN: 39 mg/dL — ABNORMAL HIGH (ref 6–20)
CALCIUM: 8.7 mg/dL — AB (ref 8.9–10.3)
CO2: 23 mmol/L (ref 22–32)
CREATININE: 1.34 mg/dL — AB (ref 0.44–1.00)
Chloride: 102 mmol/L (ref 101–111)
GFR calc Af Amer: 38 mL/min — ABNORMAL LOW (ref >60)
GFR calc non Af Amer: 33 mL/min — ABNORMAL LOW (ref >60)
GLUCOSE: 109 mg/dL — AB (ref 65–99)
Potassium: 4.9 mmol/L (ref 3.5–5.1)
Sodium: 132 mmol/L — ABNORMAL LOW (ref 135–145)

## 2016-12-24 MED ORDER — FUROSEMIDE 10 MG/ML IJ SOLN
60.0000 mg | Freq: Once | INTRAMUSCULAR | Status: AC
Start: 2016-12-24 — End: 2016-12-24
  Administered 2016-12-24: 60 mg via INTRAVENOUS
  Filled 2016-12-24: qty 6

## 2016-12-24 MED ORDER — GLUCERNA SHAKE PO LIQD
237.0000 mL | Freq: Two times a day (BID) | ORAL | Status: DC
Start: 2016-12-24 — End: 2016-12-26
  Administered 2016-12-25 – 2016-12-26 (×2): 237 mL via ORAL
  Filled 2016-12-24 (×7): qty 237

## 2016-12-24 NOTE — Evaluation (Signed)
Clinical/Bedside Swallow Evaluation Patient Details  Name: [name] MRN: [id number] Date of Birth: 1921-10-26  Today's Date: 12/24/2016 Time: SLP Start Time (ACUTE ONLY): 1150 SLP Stop Time (ACUTE ONLY): 1209 SLP Time Calculation (min) (ACUTE ONLY): 19 min  Past Medical History:  Past Medical History:  Diagnosis Date  . Anxiety   . Arthritis   . CAD (coronary artery disease)    a. h/o anterior wall MI. b. stenting of RCAx2 in 05/2001. c. DES to Cx in 2005 (last cath).  . Chronic systolic CHF (congestive heart failure) (HCC)   . DD (diverticular disease)    Hx of it - severe. Left colon 2004  . Esophageal stricture    last dialated 2007  . GERD (gastroesophageal reflux disease)   . Hiatal hernia   . HTN (hypertension)   . Hyperlipidemia   . Hypothyroidism   . Ischemic cardiomyopathy    EF 25-30% improved to 45% with biventricular pacing.   [name]. LBBB (left bundle branch block)   . Pacemaker    a. had prior ICD, but this was downgraded to CRT-P in 2017.  [name]. PVT (paroxysmal ventricular tachycardia) (HCC)   . Sleep apnea    has a Cpap machine  . UTI (lower urinary tract infection)    recurrent. (Klebsiella pneumoniae). Last Cx 02/03/09. (R-ancef, nitrofurantoin, Augmentin, Zyosyn)   Past Surgical History:  Past Surgical History:  Procedure Laterality Date  . ANGIOPLASTY     stent  . APPENDECTOMY    . CARDIAC CATHETERIZATION     stent  . CARDIAC DEFIBRILLATOR PLACEMENT     St Jude Atlas  . CATARACT EXTRACTION, BILATERAL    . COLONOSCOPY W/ POLYPECTOMY    . EP IMPLANTABLE DEVICE N/A 12/05/2015   Procedure: ICD Generator Changeout;  Surgeon: Duke SalviaSteven C Klein, MD;  [facility];  Service: Cardiovascular;  Laterality: N/A;  . HAMMER TOE SURGERY Left 01/15/2013   Procedure: LEFT SECOND THROUGH FOURTH HAMMERTOE CORRECTION ;  Surgeon: Toni ArthursJohn Hewitt, MD;  Location: [facility] OR;  Service: Orthopedics;  Laterality: Left;  . HAMMERTOE RECONSTRUCTION WITH WEIL OSTEOTOMY Right  07/17/2012   Procedure: RIGHT SECOND AND THIRD MT WEIL OSTEOTOMIES AND SECOND AND THIRD HAMMERTOE CORRECTIONS;  Surgeon: Toni ArthursJohn Hewitt, MD;  Location: [facility] OR;  Service: Orthopedics;  Laterality: Right;  . INSERT / REPLACE / REMOVE PACEMAKER    . TONSILLECTOMY    . TOTAL ABDOMINAL HYSTERECTOMY    . TOTAL KNEE ARTHROPLASTY Bilateral   . TUBAL LIGATION    . WEIL OSTEOTOMY Left 01/15/2013   Procedure:  WEIL OSTEOTOMY AND DORSAL CAPSULOTOMY;  Surgeon: Toni ArthursJohn Hewitt, MD;  Location: [facility] OR;  Service: Orthopedics;  Laterality: Left;   HPI:  83 year old female admitted 12/20/16 with back pain, decreased po intake, right hip pain after fall. PMH significant for anxiety, CHF, esophageal stricture, GERD, HTN, hypothyroid, pacemaker, recurrent UTI, nephrolithiasis. CXR = RUL opacity   Assessment / Plan / Recommendation Clinical Impression  Pt presents with adequate oral motor strength and function. She reports a history of esophageal issues, including stricture and GERD, which limits what she is able to eat. Pt was provided with trials of thin liquid, puree, and solid, and exhibited no oral difficulty or delay, and no overt s/s aspiration. Pt reports drinking a lot of liquids with meals to facilitate esophageal clearing.  SLP discussed BSE findings with MD via telephone. Will change pt diet to dys 2/finely chopped, thin liquids, meds as pt tolerates. Safe swallow precautions were  [facility] with pt and RN[facility] No further ST intervention is recommended at this time. Please reconsult if needs arise.    SLP Visit Diagnosis: Dysphagia, pharyngoesophageal phase (R13.14)    Aspiration Risk  Mild aspiration risk    Diet Recommendation Dysphagia 2 (Fine chop);Nectar-thick liquid   Liquid Administration via: Cup;Straw Medication Administration: Crushed with puree Supervision: Patient able to self feed Compensations: Slow rate;Small sips/bites;Minimize environmental distractions;Follow solids with  liquid Postural Changes: Seated upright at 90 degrees;Remain upright for at least 30 minutes after po intake    Other  Recommendations Recommended Consults: Consider esophageal assessment Oral Care Recommendations: Oral care QID   Follow up Recommendations None      Frequency and Duration            Prognosis Prognosis for Safe Diet Advancement: Guarded Barriers to Reach Goals: (long standing history of esophageal dysmotility)      Swallow Study   General Date of Onset: 12/20/16 HPI: 83 year old female admitted 12/20/16 with back pain, decreased po intake, right hip pain after fall. PMH significant for anxiety, CHF, esophageal stricture, GERD, HTN, hypothyroid, pacemaker, recurrent UTI, nephrolithiasis. CXR = RUL opacity Type of Study: Bedside Swallow Evaluation Previous Swallow Assessment: none Diet Prior to this Study: Other (Comment)(full liquid) Temperature Spikes Noted: No Respiratory Status: Nasal cannula History of Recent Intubation: No Behavior/Cognition: Alert;Cooperative;Pleasant mood Oral Cavity Assessment: Within Functional Limits Oral Care Completed by SLP: Recent completion by staff Oral Cavity - Dentition: Dentures, bottom;Dentures, top Vision: Functional for self-feeding Self-Feeding Abilities: Able to feed self Patient Positioning: Upright in bed Baseline Vocal Quality: Normal Volitional Cough: Weak Volitional Swallow: Able to elicit    Oral/Motor/Sensory Function Overall Oral Motor/Sensory Function: Within functional limits   Ice Chips Ice chips: Within functional limits Presentation: Spoon   Thin Liquid Thin Liquid: Within functional limits Presentation: Straw    Nectar Thick Nectar Thick Liquid: Not tested   Honey Thick Honey Thick Liquid: Not tested   Puree Puree: Within functional limits Presentation: Spoon   Solid   GO   Solid: Within functional limits Presentation: Self Fed       Celia B. Murvin NatalBueche, [facility] Hospital East LLCMSP, CCC-SLP Speech Language  Pathologist [phone]  Leigh AuroraBueche, Celia Brown 12/24/2016,2:12 PM

## 2016-12-24 NOTE — Progress Notes (Signed)
Date: December 24, 2016 Kenisha Lynds, BSN, RN3, CCM [phone] Chart and notes review for patient progress and needs. Will follow for case management and discharge needs. Next review date: [id number] 

## 2016-12-24 NOTE — Progress Notes (Signed)
[facility] PROGRESS NOTE  Tracy MontanaDorothy G [name] XBJ:[id number] DOB: [birth year] DOA: 12/20/2016  PCP: Helane RimaWallace, Erica, DO  Brief History/Interval Summary: 83 year old Caucasian female with a past medical history of chronic systolic CHF with an ejection fraction of 45-50%, esophageal stricture, hypothyroidism, pacemaker, recurrent UTI, history of nephrolithiasis, history of hydronephrosis recently started on amoxicillin by her urologist, has been having worsening back pain hip pain decreased oral intake.  On evaluation patient was found to have atrial flutter with RVR.  Patient was hospitalized for further management.  Patient was seen by cardiology.  Her medications were adjusted.  She also developed pulmonary edema.  She had to be given Lasix intravenously.  Reason for Visit: Atrial flutter with RVR  Consultants: Cardiology.  Gastroenterology.  Procedures:  Transthoracic echocardiogram Study Conclusions  - Left ventricle: The cavity size was normal. Systolic function was   mildly reduced. The estimated ejection fraction was in the range   of 45% to 50%. Diffuse hypokinesis. - Ventricular septum: Septal motion showed abnormal function and   dyssynergy. - Aortic valve: Trileaflet; mildly thickened, mildly calcified   leaflets. - Mitral valve: Calcified annulus. There was moderate   regurgitation. - Left atrium: The atrium was severely dilated. - Right ventricle: Pacer wire or catheter noted in right ventricle. - Tricuspid valve: There was mild regurgitation. - Pulmonary arteries: Systolic pressure was mildly increased. PA   peak pressure: 44 mm Hg (S).  Impressions:  - Compared to the prior study, there has been no significant   interval change.  Antibiotics: Amoxacillin  Subjective/Interval History: Patient continues to feel short of breath this morning.  Less so compared to yesterday.  Denies any chest pain.  Denies any lightheadedness.  Her grand daughter-in-law is  at the bedside.    ROS: Denies any headaches.  Objective:  Vital Signs  Vitals:   12/24/16 0300 12/24/16 0327 12/24/16 0400 12/24/16 0500  BP: 109/66  (!) 108/57 106/72  Pulse: 87  81 86  Resp: 17  16 (!) 21  Temp:  (!) 97.5 F (36.4 C)    TempSrc:  Oral    SpO2: 92%  95% 95%  Weight:      Height:        Intake/Output Summary (Last 24 hours) at 12/24/2016 0754 Last data filed at 12/24/2016 0500 Gross per 24 hour  Intake -  Output 1250 ml  Net -1250 ml   Filed Weights   12/21/16 0400 12/22/16 0423 12/22/16 1230  Weight: 56.7 kg (125 lb 0 oz) 62.4 kg (137 lb 9.1 oz) 62 kg (136 lb 11 oz)   Telemetry shows heart rate in the 50s-70s.  Still in atrial flutter.  General appearance: Awake alert.  In no distress. Resp: Improved aeration compared to yesterday but continues to have crackles bilaterally.  Tachypneic at rest.    Cardio: S1-S2 is irregularly irregular.  Not as tachycardic as yesterday.  Systolic murmur over the precordium. GI: Abdomen remains soft.  Nontender nondistended.  Bowel sounds are present. Extremities: No edema Neurologic: No focal deficits  Lab Results:  Data Reviewed: I have personally reviewed following labs and imaging studies  CBC: Recent Labs  Lab 12/20/16 1225 12/20/16 1318 12/21/16 0451 12/22/16 0326 12/23/16 0327 12/24/16 0340  WBC 10.0  --  9.4 8.8 12.1* 11.8*  NEUTROABS 8.1*  --   --   --   --   --   HGB 11.7* 12.6 10.4* 11.0* 11.7* 11.6*  HCT 33.7* 37.0 30.1* 33.0* 34.8* 33.9*  MCV  88.2  --  89.1 90.7 90.6 89.0  PLT 225  --  221 271 315 332    Basic Metabolic Panel: Recent Labs  Lab 12/20/16 1225 12/20/16 1318 12/21/16 0451 12/22/16 0326 12/23/16 0327 12/24/16 0340  NA 127* 128* 130* 133* 134* 132*  K 5.0 4.8 4.4 5.0 5.4* 4.9  CL 93* 94* 103 104 105 102  CO2 25  --  21* 25 24 23   GLUCOSE 127* 122* 109* 108* 111* 109*  BUN 72* 61* 53* 43* 37* 39*  CREATININE 1.60* 1.60* 1.32* 1.21* 1.23* 1.34*  CALCIUM 9.4  --  8.3*  8.8* 9.1 8.7*    GFR: Estimated Creatinine Clearance: 21.5 mL/min (A) (by C-G formula based on SCr of 1.34 mg/dL (H)).  Liver Function Tests: Recent Labs  Lab 12/20/16 1225  AST 18  ALT 14  ALKPHOS 64  BILITOT 0.7  PROT 6.4*  ALBUMIN 2.9*    Anemia Panel: Recent Labs    12/22/16 0326  VITAMINB12 433  FOLATE 34.0  FERRITIN 140  TIBC 183*  IRON 18*  RETICCTPCT 0.9    Recent Results (from the past 240 hour(s))  MRSA PCR Screening     Status: None   Collection Time: 12/20/16  6:55 PM  Result Value Ref Range Status   MRSA by PCR NEGATIVE NEGATIVE Final    Comment:        The GeneXpert MRSA Assay (FDA approved for NASAL specimens only), is one component of a comprehensive MRSA colonization surveillance program. It is not intended to diagnose MRSA infection nor to guide or monitor treatment for MRSA infections.       Radiology Studies: Dg Chest Port 1 View  Result Date: 12/22/2016 CLINICAL DATA:  Dyspnea EXAM: PORTABLE CHEST 1 VIEW COMPARISON:  12/20/2016 FINDINGS: Cardiac shadow is stable. Aortic calcifications are noted. Defibrillator is again seen. Elevation the right hemidiaphragm is again noted. Mild bibasilar atelectatic changes are seen. Hiatal hernia is again noted. IMPRESSION: Bibasilar atelectatic changes. Electronically Signed   By: Alcide CleverMark  Lukens M.D.   On: 12/22/2016 08:49     Medications:  Scheduled: . acetaminophen  1,000 mg Oral TID  . acidophilus  1 capsule Oral Daily  . amoxicillin  500 mg Oral Q12H  . aspirin EC  81 mg Oral Daily  . budesonide (PULMICORT) nebulizer solution  0.25 mg Nebulization BID  . diltiazem  30 mg Oral Q6H  . enoxaparin (LOVENOX) injection  30 mg Subcutaneous Q24H  . feeding supplement (ENSURE ENLIVE)  237 mL Oral BID BM  . levothyroxine  100 mcg Oral QAC breakfast  . lidocaine  2 patch Transdermal Q24H  . metoprolol succinate  75 mg Oral BID  . pravastatin  20 mg Oral Daily  . psyllium  1 packet Oral Daily  .  ranitidine  150 mg Oral BID   Continuous:  UJW:[id number]:acetaminophen **OR** acetaminophen, alum & mag hydroxide-simeth, diphenhydrAMINE, metoprolol tartrate, ondansetron **OR** ondansetron (ZOFRAN) IV, oxyCODONE, polyethylene glycol  Assessment/Plan:  Active Problems:   Paroxysmal ventricular tachycardia (HCC)   History of recurrent UTIs   Malnutrition of moderate degree   Tachycardia   AKI (acute kidney injury) (HCC)   Dysphagia    Atrial flutter with RVR Patient was initially placed on a diltiazem infusion.  Heart rate was better controlled.  Patient transitioned to oral metoprolol.  Cardizem was also initiated by cardiology.  Heart rate went into the 30s and 40s briefly overnight.  Heart rate now between 50s-70s.  Defer further management to  cardiology. Echocardiogram is as above.  Systolic function about 45%.  Continue aspirin. Patient not considered a good candidate for anticoagulation by cardiology due to previous falls, frailty.   Acute pulmonary edema Patient was given Lasix yesterday with some diuresis.  X-ray today raises concern for opacity in the right lung however I feel this is pulmonary edema.  She will be given additional dose of diuretics.  Aspiration is a possibility.  We will also have her seen by speech therapy.  Strict ins and outs and daily weights.  Acute on chronic systolic CHF/history of coronary artery disease Initially Lasix was held due to hypotension and worsening renal function. Foley catheter placed for strict ins and outs.  Echocardiogram shows EF to be around 45%.  She also has pacemaker.  Additional dose of Lasix to be given today.  Acute kidney injury/hyponatremia Patient's creatinine previously noted to be normal.  Creatinine noted to be elevated at the time of admission up to 1.6.  She was also found to have hyponatremia.  Patient has had poor oral intake the last few days.  She was thought to be dehydrated.  Her diuretics were held.  She was gently hydrated.   But then she developed pulmonary edema as discussed above.  She will need Lasix.  Creatinine remains in the 1.2-1.3 range.  We may have to tolerate a higher level of creatinine in this patient. Renal ultrasound shows persistent right-sided hydronephrosis as previously seen.  At this point in time as long as patient continues to make urine we will not do any further workup.  She can follow-up with her urologist as an outpatient.  Sodium level has improved and is stable.  Potassium level is normal today.  History of hypothyroidism Continue Synthroid.  TSH noted to be slightly elevated.  We would recommend that this be rechecked in the next 2-3 weeks.  No changes to medications at this time.  History of recurrent UTI and hydronephrosis Recently seen by urology.  Started on amoxicillin which will be continued.  Stable right-sided hydronephrosis seen on ultrasound.  Severe protein calorie malnutrition/physical deconditioning PT and OT evaluation.  Encourage oral intake.  Dysphagia most likely esophageal Patient with the history of esophageal stricture and hiatal hernia.  Previously has had esophageal dilatation.  Seen by gastroenterology during this hospitalization.  They have had discussions with patient and family.  Patient elected not to undergo any procedures.  Continue soft/liquid diet.   Chronic back pain and hip pain This is secondary to degenerative disease and disc disease.  No recent falls or injuries.  No injuries identified on imaging studies.  Normocytic anemia Monitor hemoglobin closely.  No evidence for overt blood loss.  Ferritin noted to be 140.  Vitamin B12 433.  Folate 34.  Goals of care Palliative medicine was consulted and they have seen the patient.  Currently plan is to pursue medical treatment.  However if patient does not improve as anticipated or has setbacks then this may have to be revisited. She is DNR.  DVT Prophylaxis: Lovenox    Code Status: DNR Family  Communication: Discussed with the patient and her family Disposition Plan: She will remain in stepdown for now.  Management as outlined above.    LOS: 4 days   Osvaldo Shipper  [facility] Pager [phone] 12/24/2016, 7:54 AM  If 7PM-7AM, please contact night-coverage at [email or web address], password [facility]

## 2016-12-24 NOTE — Progress Notes (Signed)
Physical Therapy Treatment Patient Details Name: [name] MRN: [id number] DOB: February 12, 1921 Today's Date: 12/24/2016    History of Present Illness 83 year old female was admitted for back pain, decreased oral intake and worsening R hip pain, VT, Aflutter. Dx'd with recurrent utis.  H/o scoliosis, CHF, cardiomyopathy    PT Comments    Progressing slowly with mobility. Pt appears a bit weaker on today. She was more fatigued with activity on today as well. Hr up to 130s and dyspnea 3/4 with activity. Unable to get a reading on O2 monitor. Granddaughter present during session. Continue to recommend SNF depending on medical course.     Follow Up Recommendations  SNF     Equipment Recommendations  None recommended by PT    Recommendations for Other Services       Precautions / Restrictions Precautions Precautions: Fall Precaution Comments: monitor vitals Restrictions Weight Bearing Restrictions: No    Mobility  Bed Mobility Overal bed mobility: Needs Assistance Bed Mobility: Sit to Supine       Sit to supine: Mod assist   General bed mobility comments: Assist to get LEs onto bed.   Transfers Overall transfer level: Needs assistance Equipment used: Rolling walker (2 wheeled) Transfers: Sit to/from Stand Sit to Stand: Mod assist         General transfer comment: Increased time. Assist to rise, stabilize. Cues for safety, hand placement.   Ambulation/Gait Ambulation/Gait assistance: Min assist;+2 safety/equipment Ambulation Distance (Feet): 30 Feet Assistive device: Rolling walker (2 wheeled) Gait Pattern/deviations: Step-through pattern;Trunk flexed     General Gait Details: Assist to stabilize throughout ambulation. Cues for safe/proper use of walker.    Stairs            Wheelchair Mobility    Modified Rankin (Stroke Patients Only)       Balance                                            Cognition Arousal/Alertness:  Awake/[name] Behavior During Therapy: WFL for tasks assessed/performed Overall Cognitive Status: Within Functional Limits for tasks assessed                                        Exercises      General Comments        Pertinent Vitals/Pain Pain Assessment: Faces Faces Pain Scale: Hurts little more Pain Location: R hip/LE Pain Descriptors / Indicators: Discomfort Pain Intervention(s): Monitored during session;Premedicated before session    Home Living                      Prior Function            PT Goals (current goals can now be found in the care plan section) Progress towards PT goals: Progressing toward goals    Frequency    Min 3X/week      PT Plan Current plan remains appropriate    Co-evaluation              AM-PAC PT "6 Clicks" Daily Activity  Outcome Measure  Difficulty turning over in bed (including adjusting bedclothes, sheets and blankets)?: A Little Difficulty moving from lying on back to sitting on the side of the bed? : Unable Difficulty sitting down on and  standing up from a chair with arms (e.g., wheelchair, bedside commode, etc,.)?: Unable Help needed moving to and from a bed to chair (including a wheelchair)?: A Little Help needed walking in hospital room?: A Little Help needed climbing 3-5 steps with a railing? : A Lot 6 Click Score: 13    End of Session Equipment Utilized During Treatment: Gait belt Activity Tolerance: Patient limited by fatigue Patient left: in bed;with call bell/phone within reach;with bed alarm set;with family/visitor present   PT Visit Diagnosis: Muscle weakness (generalized) (M62.81);Pain;Difficulty in walking, not elsewhere classified (R26.2) Pain - Right/Left: Right Pain - part of body: Leg     Time: [id number] PT Time Calculation (min) (ACUTE ONLY): 22 min  Charges:  $Gait Training: 8-22 mins                    G Codes:          [name], MPT Pager:  [phone]

## 2016-12-24 NOTE — Progress Notes (Signed)
NUTRITION NOTE  RN states that pt does not care for Ensure Enlive as it is too sweet for her. Pt requesting Glucerna Shake. RD will d/c Ensure and order Glucerna BID (220 kcal, 10 grams of protein per container). RD will continue to follow per protocol.     Trenton GammonJessica Jalilah Wiltsie, MS, RD, LDN, [facility] Inpatient Clinical Dietitian Pager # [phone] After hours/weekend pager # [phone]

## 2016-12-24 NOTE — Progress Notes (Signed)
Progress Note  Patient Name: [name] Date of Encounter: 12/24/2016  Primary Cardiologist: Dr Sherryl Manges  Subjective   Pt walked to the door this morning with staff. She said "that was all I could do". She had some dyspnea and mild chest tightness when she got back. Currently she has no chest discomfort. She has DOE with any exertion.   Inpatient Medications    Scheduled Meds: . acetaminophen  1,000 mg Oral TID  . acidophilus  1 capsule Oral Daily  . amoxicillin  500 mg Oral Q12H  . aspirin EC  81 mg Oral Daily  . budesonide (PULMICORT) nebulizer solution  0.25 mg Nebulization BID  . diltiazem  30 mg Oral Q6H  . enoxaparin (LOVENOX) injection  30 mg Subcutaneous Q24H  . feeding supplement (GLUCERNA SHAKE)  237 mL Oral BID BM  . levothyroxine  100 mcg Oral QAC breakfast  . lidocaine  2 patch Transdermal Q24H  . metoprolol succinate  75 mg Oral BID  . pravastatin  20 mg Oral Daily  . psyllium  1 packet Oral Daily  . ranitidine  150 mg Oral BID   Continuous Infusions:  PRN Meds: acetaminophen **OR** acetaminophen, alum & mag hydroxide-simeth, diphenhydrAMINE, metoprolol tartrate, ondansetron **OR** ondansetron (ZOFRAN) IV, oxyCODONE, polyethylene glycol   Vital Signs    Vitals:   12/24/16 0500 12/24/16 0800 12/24/16 0907 12/24/16 1029  BP: 106/72 134/75  136/87  Pulse: 86 85    Resp: (!) 21 20    Temp:  97.6 F (36.4 C)    TempSrc:  Oral    SpO2: 95% (!) 81% 93%   Weight:      Height:        Intake/Output Summary (Last 24 hours) at 12/24/2016 1056 Last data filed at 12/24/2016 0800 Gross per 24 hour  Intake 200 ml  Output 650 ml  Net -450 ml   Filed Weights   12/21/16 0400 12/22/16 0423 12/22/16 1230  Weight: 125 lb 0 oz (56.7 kg) 137 lb 9.1 oz (62.4 kg) 136 lb 11 oz (62 kg)    Telemetry    Vpacing with underlying atrial flutter with variable conduction in the 50'-70's. Was in aflutter with RVR, 2:1 conduction this am with rates around 130  bpm - Personally Reviewed  ECG    No new tracings - Personally Reviewed  Physical Exam   GEN: Frail, elderly female. No acute distress.   Neck: minimal JVD Cardiac: irregularly irregular rhythm, no murmurs, rubs, or gallops.  Respiratory: Clear to auscultation bilaterally with few rales in the bases GI: Soft, nontender, non-distended  MS: No edema; No deformity. Neuro:  Nonfocal  Psych: Normal affect   Labs    Chemistry Recent Labs  Lab 12/20/16 1225  12/22/16 0326 12/23/16 0327 12/24/16 0340  NA 127*   < > 133* 134* 132*  K 5.0   < > 5.0 5.4* 4.9  CL 93*   < > 104 105 102  CO2 25   < > 25 24 23   GLUCOSE 127*   < > 108* 111* 109*  BUN 72*   < > 43* 37* 39*  CREATININE 1.60*   < > 1.21* 1.23* 1.34*  CALCIUM 9.4   < > 8.8* 9.1 8.7*  PROT 6.4*  --   --   --   --   ALBUMIN 2.9*  --   --   --   --   AST 18  --   --   --   --  ALT 14  --   --   --   --   ALKPHOS 64  --   --   --   --   BILITOT 0.7  --   --   --   --   GFRNONAA 27*   < > 37* 37* 33*  GFRAA 31*   < > 43* 42* 38*  ANIONGAP 9   < > 4* 5 7   < > = values in this interval not displayed.     Hematology Recent Labs  Lab 12/22/16 0326 12/23/16 0327 12/24/16 0340  WBC 8.8 12.1* 11.8*  RBC 3.64*  3.64* 3.84* 3.81*  HGB 11.0* 11.7* 11.6*  HCT 33.0* 34.8* 33.9*  MCV 90.7 90.6 89.0  MCH 30.2 30.5 30.4  MCHC 33.3 33.6 34.2  RDW 13.3 13.5 13.4  PLT 271 315 332    Cardiac EnzymesNo results for input(s): TROPONINI in the last 168 hours.  Recent Labs  Lab 12/20/16 1311  TROPIPOC 0.01     BNPNo results for input(s): BNP, PROBNP in the last 168 hours.   DDimer No results for input(s): DDIMER in the last 168 hours.   Radiology    Dg Chest Port 1 View  Result Date: 12/24/2016 CLINICAL DATA:  Dyspnea. EXAM: PORTABLE CHEST 1 VIEW COMPARISON:  Radiograph of December 22, 2016. FINDINGS: Stable cardiomediastinal silhouette. Atherosclerosis of thoracic aorta is noted. Left-sided pacemaker is unchanged in  position. No pneumothorax is noted. Stable elevated right hemidiaphragm. Stable bibasilar subsegmental atelectasis is noted. Increased right upper lobe opacity is noted concerning for inflammation. Bony thorax is unremarkable. IMPRESSION: Aortic atherosclerosis. Stable bibasilar subsegmental atelectasis. Increased right upper lobe opacity is noted concerning for worsening inflammation or pneumonia. Electronically Signed   By: Lupita RaiderJames  Green Jr, M.D.   On: 12/24/2016 08:11    Cardiac Studies   Echocardiogram 12/21/2016: Study Conclusions - Left ventricle: The cavity size was normal. Systolic function was mildly reduced. The estimated ejection fraction was in the range of 45% to 50%. Diffuse hypokinesis. - Ventricular septum: Septal motion showed abnormal function and dyssynergy. - Aortic valve: Trileaflet; mildly thickened, mildly calcified leaflets. - Mitral valve: Calcified annulus. There was moderate regurgitation. - Left atrium: The atrium was severely dilated. - Right ventricle: Pacer wire or catheter noted in right ventricle. - Tricuspid valve: There was mild regurgitation. - Pulmonary arteries: Systolic pressure was mildly increased. PA peak pressure: 44 mm Hg (S).  Impressions: - Compared to the prior study, there has been no significant interval change.  Patient Profile     83 y.o. female with a history of CAD status post prior anterior wall infarct, stent interventions to the RCA in 2003 and circumflex in 2005, ischemic cardiomyopathy, CRT-P in place, hypertension, hyperlipidemia, hypothyroidism, paroxysmal VT, and left bundle branch block and esophageal strictures with multiple dilitations. Patient presents with failure to thrive, acute renal insufficiency, acute anemia, and persistent typical atrial flutter.  Assessment & Plan    1. Persistent, typical atrial flutter -Pt was on cardizem drip initially. Switched to oral dosing 30 mg q6h. Midnight dose was held  due to low BP (88/53) and bradycardia. Continues on Toprol 75 mg BID.  -Pt currently Vpacing with underlying atrial flutter in the 50's-70's. Was rate controlled over night, but had some RVR in the 130s this am, possibly due to skipped dose of cardizem.  -CHA2DS2/VAS Stroke Risk Score is 6 (CHF, Vasc Dz, HTN, Age (2), female), however, she is not anticoagulated in light of increased fall  risk, recent anemia and failure tothrive. She is on low dose aspirin. -PPM check in mid November indicated 33 mode switches with greater than 2 hours of afib in April.  -With intermittent hypotension, pt may do better on amiodarone. Bradycardia should be limited with PPM in place. Unsure of lower limit.     2. Ischemic cardiomyopathy -LVEF 45-50% per echo 12/21/16, no change from previous -Has some mild chest tightness of exertion. Possibly related to tachycardia with activity.   3. S/P CRT-P ([employer])  -Followed by Dr. Graciela HusbandsKlein. Last generator change in 11/2015 with downgrade from CRT-D to CRT-P  4. Acute renal isufficiency -SCr 1.60 on admission. Improved to 1.21, floating back up, 1.34 today.   5. Hyperlipidemia -On pravastatin 20 mg  6. Failure to thrive -Pt having difficulty eating with hx of esophageal stricture and nothing tastes good to her. She is trying to take in liquid nutrition. The family is very invovled.   For questions or updates, please contact [facility] Please consult [email or web address] for contact info under Cardiology/STEMI.      Signed, Berton BonJanine Elyssa Pendelton, NP  12/24/2016, 10:56 AM

## 2016-12-24 NOTE — Progress Notes (Signed)
Pt resting peacefully at this time.  Pt family requested that she be allowed to continue sleeping as is and not be woken up for CPAP or her breathing treatment.  RT discussed with family that will revisit CPAP later in the night if Pt wakes up or it is felt needed by medical staff.  Family agreeable to this.  RN aware.

## 2016-12-25 DIAGNOSIS — I5023 Acute on chronic systolic (congestive) heart failure: Secondary | ICD-10-CM

## 2016-12-25 DIAGNOSIS — Z515 Encounter for palliative care: Secondary | ICD-10-CM

## 2016-12-25 DIAGNOSIS — E871 Hypo-osmolality and hyponatremia: Secondary | ICD-10-CM

## 2016-12-25 LAB — BASIC METABOLIC PANEL
ANION GAP: 8 (ref 5–15)
BUN: 34 mg/dL — ABNORMAL HIGH (ref 6–20)
CALCIUM: 8.4 mg/dL — AB (ref 8.9–10.3)
CO2: 23 mmol/L (ref 22–32)
Chloride: 103 mmol/L (ref 101–111)
Creatinine, Ser: 1.32 mg/dL — ABNORMAL HIGH (ref 0.44–1.00)
GFR calc Af Amer: 39 mL/min — ABNORMAL LOW (ref >60)
GFR calc non Af Amer: 34 mL/min — ABNORMAL LOW (ref >60)
GLUCOSE: 88 mg/dL (ref 65–99)
Potassium: 3.9 mmol/L (ref 3.5–5.1)
Sodium: 134 mmol/L — ABNORMAL LOW (ref 135–145)

## 2016-12-25 LAB — CBC
HEMATOCRIT: 33.8 % — AB (ref 36.0–46.0)
Hemoglobin: 11.4 g/dL — ABNORMAL LOW (ref 12.0–15.0)
MCH: 29.9 pg (ref 26.0–34.0)
MCHC: 33.7 g/dL (ref 30.0–36.0)
MCV: 88.7 fL (ref 78.0–100.0)
Platelets: 313 10*3/uL (ref 150–400)
RBC: 3.81 MIL/uL — ABNORMAL LOW (ref 3.87–5.11)
RDW: 13.4 % (ref 11.5–15.5)
WBC: 11.1 10*3/uL — AB (ref 4.0–10.5)

## 2016-12-25 LAB — GLUCOSE, CAPILLARY: Glucose-Capillary: 93 mg/dL (ref 65–99)

## 2016-12-25 NOTE — Progress Notes (Signed)
Occupational Therapy Treatment Patient Details Name: [name] MRN: [id number] DOB: March 06, 1921 Today's Date: 12/25/2016    History of present illness 83 year old female was admitted for back pain, decreased oral intake and worsening R hip pain, VT, Aflutter. Dx'd with recurrent utis.  H/o scoliosis, CHF, cardiomyopathy   OT comments  Pt steadier and increased endurance today.  Used 3:1 commode; pt had walked earlier. Assisted back to bed for a rest; she was sitting up in chair when OT arrived  Follow Up Recommendations  SNF;Supervision/Assistance - 24 hour    Equipment Recommendations  3 in 1 bedside commode    Recommendations for Other Services      Precautions / Restrictions Precautions Precautions: Fall Precaution Comments: monitor vitals Restrictions Weight Bearing Restrictions: No       Mobility Bed Mobility           Sit to supine: Min guard   General bed mobility comments: for safety and lines  Transfers       Sit to Stand: Min assist Stand pivot transfers: Min assist            Balance                                           ADL either performed or assessed with clinical judgement   ADL       Grooming: Set up;Sitting               Lower Body Dressing: Minimal assistance;Sit to/from stand(underwear and pad)   Toilet Transfer: Stand-pivot;BSC;RW;Minimal assistance(steadying assistance)   Toileting- Clothing Manipulation and Hygiene: Sit to/from stand;Minimal assistance         General ADL Comments: performed SPT to 3:1 then to the chair. Pt stood for an extended time for hygiene and clothes management     Vision       Perception     Praxis      Cognition Arousal/Alertness: Awake/alert Behavior During Therapy: WFL for tasks assessed/performed Overall Cognitive Status: Within Functional Limits for tasks assessed                                          Exercises      Shoulder Instructions       General Comments      Pertinent Vitals/ Pain       Pain Score: 2  Pain Location: back Pain Descriptors / Indicators: Sore Pain Intervention(s): Limited activity within patient's tolerance;Monitored during session;Repositioned  Home Living                                          Prior Functioning/Environment              Frequency           Progress Toward Goals  OT Goals(current goals can now be found in the care plan section)  Progress towards OT goals: Progressing toward goals     Plan      Co-evaluation                 AM-PAC PT "6 Clicks" Daily Activity     Outcome Measure   Help from  another person eating meals?: None Help from another person taking care of personal grooming?: A Little Help from another person toileting, which includes using toliet, bedpan, or urinal?: A Little Help from another person bathing (including washing, rinsing, drying)?: A Lot Help from another person to put on and taking off regular upper body clothing?: A Little Help from another person to put on and taking off regular lower body clothing?: A Lot 6 Click Score: 17    End of Session        Activity Tolerance Patient tolerated treatment well   Patient Left in chair;with call bell/phone within reach;with chair alarm set;with family/visitor present   Nurse Communication          Time: [id number] OT Time Calculation (min): 24 min  Charges: OT Treatments $Self Care/Home Management : 23-37 mins  Marica OtterMaryellen Tiarna Koppen, OTR/L [phone] 12/25/2016   [name] 12/25/2016, 1:27 PM

## 2016-12-25 NOTE — Progress Notes (Signed)
PROGRESS NOTE  Tracy MontanaDorothy G [name] GNF:[id number] DOB: 1921-11-17 DOA: 12/20/2016 PCP: Helane RimaWallace, Erica, DO  Brief Narrative: 89yow presented with gradual decline, worsening back pain, hip pain, decreased oral intake, fall at home with ecchymosis to head. Admitted for new aflutter with RVR, VT, hyponatremia, AKI. Rate control was challenging but eventually responded to BB and CCB,. Hyponatremia resolved with IVF as did AKI. Seen by GI for chronic dysphagia. No further evaluation suggested. Developed acute on chronic CHF secondary to IVF for AKI.  Assessment/Plan Atrial flutter typical with RVR, new dx. CHA2DS2-VASc 6. Not a candidate for anticoagulation per cardiology secondary to falls, frailty. - rate control as rec by cardiology with Toprol XL and diltiazem. - continue ASA  RUL infiltrate. Completely asymptomatic, no apparent clinical significance. No s/s of pneumonia. Follow clinically.  AKI. Renal u/s showed persistent right-sided hydronephrosis. -resolving slowly. IVF on hold secondary CHF. Continue supportive care. Hold diuretic.  Acute hyponatremia. Secondary to poor oral intake. - nearly resolved with IVF   Acute on chronic systolic CHF, ischemic cardiomyopathy. Echocardiogram shows LVEF 45-50% - resume oral Lasix 12/19.  Recurrent UTI, nephrolithiasis, hydronephrosis followed by urology. - continue amoxicillin as present on admission per urology.  S/p PPM; PMH CAD, MI  Esophageal stricture and dysmotilty with chronic dysphagia solid and liquid associated with significant weight loss. - seen by GI, pt declined endoscopy. GI rec full liquid to mechanical soft diet. Avoid meats and bread. Increase supplements to at least BID. Sit upright for meals and stay upright one hour afterward. Zantac 150mg  BID started.  - ST rec Dysphagia 2 (Fine chop);Nectar-thick liquid. Liquid Administration via: Cup;Straw Medication Administration: Crushed with puree  Hypothyroidism - continue  levothyroxine - TSH slightly elevated, recheck in 4 weeks. No change to Rx now.  Chronic back and hip pain secondary to degenerative disease, multiple disc disease. - seen by PMT for symptom management, scheduled Tylenol, added lidoderm patch, oxycodone PRN  OSA on CPAP at home.  Aortic atherosclerosis.   Improved; rate controlled, asymptomatic. Will transfer to tele. If rate stable 12/19 anticipate transfer to SNF  Remove foley  DVT prophylaxis: enoxaparin Code Status: DNR Family Communication: granddaughter at bedside Disposition Plan: SNF   Brendia Sacksaniel Goodrich, MD  [facility] Direct contact: [phone] --Via amion app OR  --[email or web address]; password [email or web address]  7PM-7AM contact night coverage as above 12/25/2016, 11:35 AM  LOS: 5 days   Consultants:  Cardiology  GI  PMT  Procedures:  Echo Study Conclusions  - Left ventricle: The cavity size was normal. Systolic function was   mildly reduced. The estimated ejection fraction was in the range   of 45% to 50%. Diffuse hypokinesis. - Ventricular septum: Septal motion showed abnormal function and   dyssynergy. - Aortic valve: Trileaflet; mildly thickened, mildly calcified   leaflets. - Mitral valve: Calcified annulus. There was moderate   regurgitation. - Left atrium: The atrium was severely dilated. - Right ventricle: Pacer wire or catheter noted in right ventricle. - Tricuspid valve: There was mild regurgitation. - Pulmonary arteries: Systolic pressure was mildly increased. PA   peak pressure: 44 mm Hg (S).  Impressions:  - Compared to the prior study, there has been no significant   interval change.  Antimicrobials:    Interval history/Subjective: Feels good. No difficulty swallowing. No SOB. No significant cough. No CP or palpitations.  Objective: Vitals:  Vitals:   12/25/16 0400 12/25/16 0800  BP: (!) 114/49   Pulse:    Resp: 17   Temp: 97.6 F (36.4  C) 97.6 F (36.4 C)  SpO2: 97%      Exam:  Constitutional:  . Appears calm and comfortable sitting in chair. Ambulated in hall earlier with assistance.  Eyes:  . pupils and irises appear normal . Normal lids  ENMT:  . grossly normal hearing  . Lips appear normal Respiratory:  . CTA bilaterally, no w/r/r.  . Respiratory effort normal. Cardiovascular:  . RRR, no m/r/g . No LE extremity edema   Musculoskeletal:  . Digits/nails BUE: no clubbing, cyanosis, petechiae, infection . RUE, LUE o strength and tone normal, no atrophy, no abnormal movements psychiatric:  . Mental status o Mood, affect appropriate  I have personally reviewed the following:  Filed Weights   12/22/16 0423 12/22/16 1230 12/24/16 1600  Weight: 62.4 kg (137 lb 9.1 oz) 62 kg (136 lb 11 oz) 62.4 kg (137 lb 9.1 oz)   Weight change:   UOP: 1650 I/O since admission: -1540 Last BM charted: 12/17 Foley: YES Telemetry: paced rhythm Status: SDU  Labs:  BUN and creatinine continue to trend downwards  Imaging studies:  CXR independently reviewed, right base atelectasis, opacity mid right lung field  Scheduled Meds: . acetaminophen  1,000 mg Oral TID  . acidophilus  1 capsule Oral Daily  . amoxicillin  500 mg Oral Q12H  . aspirin EC  81 mg Oral Daily  . budesonide (PULMICORT) nebulizer solution  0.25 mg Nebulization BID  . diltiazem  30 mg Oral Q6H  . enoxaparin (LOVENOX) injection  30 mg Subcutaneous Q24H  . feeding supplement (GLUCERNA SHAKE)  237 mL Oral BID BM  . levothyroxine  100 mcg Oral QAC breakfast  . lidocaine  2 patch Transdermal Q24H  . metoprolol succinate  75 mg Oral BID  . pravastatin  20 mg Oral Daily  . psyllium  1 packet Oral Daily  . ranitidine  150 mg Oral BID   Continuous Infusions:  Principal Problem:   Atrial flutter with rapid ventricular response (HCC) Active Problems:   Paroxysmal ventricular tachycardia (HCC)   History of recurrent UTIs   Malnutrition of moderate degree   AKI (acute kidney injury)  (HCC)   Dysphagia   DCM (dilated cardiomyopathy) (HCC)   Hyponatremia   Acute on chronic systolic CHF (congestive heart failure) (HCC)   LOS: 5 days

## 2016-12-25 NOTE — Progress Notes (Signed)
Progress Note  Patient Name: Laurann MontanaDorothy G Shahin Date of Encounter: 12/25/2016  Primary Cardiologist: Dr. Sherryl MangesSteven Klein  Subjective   Ms. Bing NeighborsHackney is much more talkative this morning. She slept very well last night and feels more rested. She denies chest pain or dyspnea. She does say that she feels that she is on the verge of being dizzy, but not quite there. She is still not eating much of anything.   Inpatient Medications    Scheduled Meds: . acetaminophen  1,000 mg Oral TID  . acidophilus  1 capsule Oral Daily  . amoxicillin  500 mg Oral Q12H  . aspirin EC  81 mg Oral Daily  . budesonide (PULMICORT) nebulizer solution  0.25 mg Nebulization BID  . diltiazem  30 mg Oral Q6H  . enoxaparin (LOVENOX) injection  30 mg Subcutaneous Q24H  . feeding supplement (GLUCERNA SHAKE)  237 mL Oral BID BM  . levothyroxine  100 mcg Oral QAC breakfast  . lidocaine  2 patch Transdermal Q24H  . metoprolol succinate  75 mg Oral BID  . pravastatin  20 mg Oral Daily  . psyllium  1 packet Oral Daily  . ranitidine  150 mg Oral BID   Continuous Infusions:  PRN Meds: acetaminophen **OR** acetaminophen, alum & mag hydroxide-simeth, diphenhydrAMINE, metoprolol tartrate, ondansetron **OR** ondansetron (ZOFRAN) IV, oxyCODONE, polyethylene glycol   Vital Signs    Vitals:   12/24/16 2200 12/25/16 0000 12/25/16 0400 12/25/16 0800  BP: (!) 82/50 (!) 118/54 (!) 114/49   Pulse:      Resp: (!) 23 (!) 27 17   Temp:  97.8 F (36.6 C) 97.6 F (36.4 C) 97.6 F (36.4 C)  TempSrc:  Axillary Oral Oral  SpO2: 97% 95% 97%   Weight:      Height:        Intake/Output Summary (Last 24 hours) at 12/25/2016 0911 Last data filed at 12/25/2016 0400 Gross per 24 hour  Intake 120 ml  Output 1650 ml  Net -1530 ml   Filed Weights   12/22/16 0423 12/22/16 1230 12/24/16 1600  Weight: 137 lb 9.1 oz (62.4 kg) 136 lb 11 oz (62 kg) 137 lb 9.1 oz (62.4 kg)    Telemetry    BiV pacing at 70 bpm with underling  afib/flutter - Personally Reviewed  ECG    No new tracings - Personally Reviewed  Physical Exam   GEN: Frail, elderly female. No acute distress.   Neck: No JVD Cardiac: Irregualrly irregular, no murmurs, rubs, or gallops.  Respiratory: Clear to auscultation bilaterally with few crackles in the posterior bases GI: Soft, nontender, non-distended  MS: No edema; No deformity. Neuro:  Nonfocal  Psych: Normal affect   Labs    Chemistry Recent Labs  Lab 12/20/16 1225  12/23/16 0327 12/24/16 0340 12/25/16 0326  NA 127*   < > 134* 132* 134*  K 5.0   < > 5.4* 4.9 3.9  CL 93*   < > 105 102 103  CO2 25   < > 24 23 23   GLUCOSE 127*   < > 111* 109* 88  BUN 72*   < > 37* 39* 34*  CREATININE 1.60*   < > 1.23* 1.34* 1.32*  CALCIUM 9.4   < > 9.1 8.7* 8.4*  PROT 6.4*  --   --   --   --   ALBUMIN 2.9*  --   --   --   --   AST 18  --   --   --   --  ALT 14  --   --   --   --   ALKPHOS 64  --   --   --   --   BILITOT 0.7  --   --   --   --   GFRNONAA 27*   < > 37* 33* 34*  GFRAA 31*   < > 42* 38* 39*  ANIONGAP 9   < > 5 7 8    < > = values in this interval not displayed.     Hematology Recent Labs  Lab 12/23/16 0327 12/24/16 0340 12/25/16 0326  WBC 12.1* 11.8* 11.1*  RBC 3.84* 3.81* 3.81*  HGB 11.7* 11.6* 11.4*  HCT 34.8* 33.9* 33.8*  MCV 90.6 89.0 88.7  MCH 30.5 30.4 29.9  MCHC 33.6 34.2 33.7  RDW 13.5 13.4 13.4  PLT 315 332 313    Cardiac EnzymesNo results for input(s): TROPONINI in the last 168 hours.  Recent Labs  Lab 12/20/16 1311  TROPIPOC 0.01     BNPNo results for input(s): BNP, PROBNP in the last 168 hours.   DDimer No results for input(s): DDIMER in the last 168 hours.   Radiology    Dg Chest Port 1 View  Result Date: 12/24/2016 CLINICAL DATA:  Dyspnea. EXAM: PORTABLE CHEST 1 VIEW COMPARISON:  Radiograph of December 22, 2016. FINDINGS: Stable cardiomediastinal silhouette. Atherosclerosis of thoracic aorta is noted. Left-sided pacemaker is unchanged  in position. No pneumothorax is noted. Stable elevated right hemidiaphragm. Stable bibasilar subsegmental atelectasis is noted. Increased right upper lobe opacity is noted concerning for inflammation. Bony thorax is unremarkable. IMPRESSION: Aortic atherosclerosis. Stable bibasilar subsegmental atelectasis. Increased right upper lobe opacity is noted concerning for worsening inflammation or pneumonia. Electronically Signed   By: Lupita Raider, M.D.   On: 12/24/2016 08:11    Cardiac Studies   Echocardiogram12/14/2018: Study Conclusions - Left ventricle: The cavity size was normal. Systolic function was mildly reduced. The estimated ejection fraction was in the range of 45% to 50%. Diffuse hypokinesis. - Ventricular septum: Septal motion showed abnormal function and dyssynergy. - Aortic valve: Trileaflet; mildly thickened, mildly calcified leaflets. - Mitral valve: Calcified annulus. There was moderate regurgitation. - Left atrium: The atrium was severely dilated. - Right ventricle: Pacer wire or catheter noted in right ventricle. - Tricuspid valve: There was mild regurgitation. - Pulmonary arteries: Systolic pressure was mildly increased. PA peak pressure: 44 mm Hg (S).  Impressions: - Compared to the prior study, there has been no significant interval change.   Patient Profile     83 y.o. female  witha history of CAD status post prior anterior wall infarct, stent interventions to the RCA in 2003 and circumflex in 2005, ischemic cardiomyopathy, CRT-P in place, hypertension, hyperlipidemia, hypothyroidism, paroxysmal VT, and left bundle branch block and esophageal strictures with multiple dilitations. Patient presents with failure to thrive, acute renal insufficiency, acute anemia, and persistent typical atrial flutter.  Assessment & Plan    1. Persistent, typical atrial flutter -Pt was on cardizem drip initially. Switched to oral dosing 30 mg q6h. Continues on  Toprol 75 mg BID.  -Pt currently Vpacing with underlying atrial flutter with consistent rate of 70 bpm. No RVR since briefly yesterday morning.  -CHA2DS2/VAS Stroke Risk Score is 6 (CHF, Vasc Dz, HTN, Age (2), female), however, she is not anticoagulated in light of increased fall risk, recent anemia and failure tothrive. She is on low dose aspirin. -PPM check in mid November indicated 33 mode switches with greater than 2  hours of afib in April. Pacemaker is set for low rate of 50 bpm.  -Pt has intermittent hypotension, but not sustained. Continue current medications as having good rate control. Could use low dose amiodarone if she develops significant hypotension, in place of CCB.  2. Ischemic cardiomyopathy -LVEF 45-50% per echo 12/21/16, no change from previous -Wt is stable. Appears volume stable.   3. S/P CRT-P ([employer]) -Followed by Dr. Graciela HusbandsKlein. Last generator change in 11/2015 with downgrade from CRT-D to CRT-P  4. Acute renal insufficiency -SCr on admission was 1.60. Now stable at 1.32  5. Hyperlipidemia -Continue statin  6. Failure to Thrive -Pt having difficulty eating with hx of esophageal stricture and nothing tastes good. Trying to take in liquid nutrition but they are too sweet. The family is very involved. They wish to speak with the nutritionist for recommendations. Will ask nutrition to see the patient again.      For questions or updates, please contact [facility] Please consult [email or web address] for contact info under Cardiology/STEMI.      Signed, Berton BonJanine Benicia Bergevin, NP  12/25/2016, 9:11 AM

## 2016-12-26 DIAGNOSIS — Z515 Encounter for palliative care: Secondary | ICD-10-CM

## 2016-12-26 MED ORDER — DILTIAZEM HCL ER COATED BEADS 120 MG PO CP24
120.0000 mg | ORAL_CAPSULE | Freq: Every day | ORAL | Status: DC
  Administered 2016-12-27: 120 mg via ORAL
  Filled 2016-12-26: qty 1

## 2016-12-26 MED ORDER — PREMIER PROTEIN SHAKE
11.0000 [oz_av] | Freq: Two times a day (BID) | ORAL | Status: DC
  Administered 2016-12-27: 11 [oz_av] via ORAL
  Filled 2016-12-26 (×3): qty 325.31

## 2016-12-26 MED ORDER — FUROSEMIDE 20 MG PO TABS
20.0000 mg | ORAL_TABLET | Freq: Every day | ORAL | Status: DC
Start: 2016-12-26 — End: 2016-12-27
  Administered 2016-12-26 – 2016-12-27 (×2): 20 mg via ORAL
  Filled 2016-12-26 (×2): qty 1

## 2016-12-26 MED ORDER — ADULT MULTIVITAMIN LIQUID CH
15.0000 mL | Freq: Every day | ORAL | Status: DC
Start: 2016-12-26 — End: 2016-12-27
  Administered 2016-12-26 – 2016-12-27 (×2): 15 mL via ORAL
  Filled 2016-12-26 (×2): qty 15

## 2016-12-26 NOTE — Progress Notes (Signed)
Rehab admissions - Received order for inpatient rehab consult.  Noted PT/OT recommending SNF placement.  Patient lacks the medical necessity to support an inpatient rehab admission.  Agree with likely need for SNF placement.  Call me for questions.  #[phone]

## 2016-12-26 NOTE — Progress Notes (Addendum)
PROGRESS NOTE  Tracy MontanaDorothy G [name] WUJ:[id number] DOB: April 28, 1921 DOA: 12/20/2016 PCP: Helane RimaWallace, Erica, DO  Brief Narrative: 89yow presented with gradual decline, worsening back pain, hip pain, decreased oral intake, fall at home with ecchymosis to head. Admitted for new aflutter with RVR, VT, hyponatremia, AKI. Rate control was challenging but eventually responded to BB and CCB,. Hyponatremia resolved with IVF as did AKI. Seen by GI for chronic dysphagia. No further evaluation suggested. Developed acute on chronic CHF secondary to IVF for AKI. Gradually improving. Plan is for SNF at discharge, hopefully continue palliative care as an outpatient.  Assessment/Plan Atrial flutter typical with RVR, new dx. CHA2DS2-VASc 6. Not a candidate for anticoagulation per cardiology secondary to falls, frailty. - rate control as rec by cardiology with Toprol XL and diltiazem 12/19. - continue ASA 12/19 - change to long-acting diltiazem today  RUL infiltrate. Completely asymptomatic, no apparent clinical significance. No s/s of pneumonia. No further evaluation. Consider repeat CXR as an outpatient.  AKI. Renal u/s showed persistent right-sided hydronephrosis.  - improved. Expect spontaneous resolution slowed by diuresis.  Acute hyponatremia. Secondary to poor oral intake. - nearly resolved with IVF   Acute on chronic systolic CHF, ischemic cardiomyopathy. Echocardiogram shows LVEF 45-50% - resume oral Lasix today.  Recurrent UTI, nephrolithiasis, hydronephrosis followed by urology. - continue amoxicillin as present on admission per urology.  S/p PPM; PMH CAD, MI  Esophageal stricture and dysmotilty with chronic dysphagia solid and liquid associated with significant weight loss. - seen by GI, pt declined endoscopy. GI rec full liquid to mechanical soft diet. Avoid meats and bread. Increase supplements to at least BID. Sit upright for meals and stay upright one hour afterward. Zantac 150mg  BID started.    - ST rec Dysphagia 2 (Fine chop);Nectar-thick liquid. Liquid Administration via: Cup;Straw Medication Administration: Crushed with puree  Hypothyroidism - continue levothyroxine - TSH slightly elevated, recheck in 4 weeks. No change to Rx now.  Chronic back and hip pain secondary to degenerative disease, multiple disc disease. - seen by PMT for symptom management, scheduled Tylenol, added lidoderm patch, oxycodone PRN  OSA on CPAP at home.  Aortic atherosclerosis.   Appears stable, will change to long-acting diltiazem. Anticipate d/c to SNF next 24 hours, patient and granddaughter at bedside agreeable  Appreciated Dr. Lamar BlinksGolding's care. Will discuss.  DVT prophylaxis: enoxaparin Code Status: DNR Family Communication: granddaughter at bedside 12/19 Disposition Plan: SNF   Brendia Sacksaniel Todd Jelinski, MD  [facility] Direct contact: [phone] --Via amion app OR  --[email or web address]; password [email or web address]  7PM-7AM contact night coverage as above 12/26/2016, 10:55 AM  LOS: 6 days   Consultants:  Cardiology  GI  PMT  Procedures:  Echo Study Conclusions  - Left ventricle: The cavity size was normal. Systolic function was   mildly reduced. The estimated ejection fraction was in the range   of 45% to 50%. Diffuse hypokinesis. - Ventricular septum: Septal motion showed abnormal function and   dyssynergy. - Aortic valve: Trileaflet; mildly thickened, mildly calcified   leaflets. - Mitral valve: Calcified annulus. There was moderate   regurgitation. - Left atrium: The atrium was severely dilated. - Right ventricle: Pacer wire or catheter noted in right ventricle. - Tricuspid valve: There was mild regurgitation. - Pulmonary arteries: Systolic pressure was mildly increased. PA   peak pressure: 44 mm Hg (S).  Impressions:  - Compared to the prior study, there has been no significant   interval change.  Antimicrobials:    Interval history/Subjective: Feels well, no  complaints other  than poor appetite.  Objective: Vitals:  Vitals:   12/26/16 0500 12/26/16 0953  BP: (!) 103/59   Pulse: 78   Resp: 16   Temp: 98.2 F (36.8 C)   SpO2: 95% 95%    Exam:  Constitutional:   . Appears calm and comfortable Eyes:  . pupils and irises appear normal . Normal lids  ENMT:  . grossly normal hearing  . Lips appear normal Respiratory:  . CTA bilaterally, no w/r/r.  . Respiratory effort normal. Cardiovascular:  . RRR, no m/r/g . No LE extremity edema   Psychiatric:  . Mental status o Mood, affect appropriate   I have personally reviewed the following:  Filed Weights   12/22/16 1230 12/24/16 1600 12/26/16 0640  Weight: 62 kg (136 lb 11 oz) 62.4 kg (137 lb 9.1 oz) 57.9 kg (127 lb 10.3 oz)   Weight change: -4.5 kg (-14.7 oz)  UOP: unrecorded I/O since admission: inaccurate Last BM charted: 12/17 Foley:   Telemetry: paced rhythm Status: tele  Labs:  None today  Scheduled Meds: . acetaminophen  1,000 mg Oral TID  . acidophilus  1 capsule Oral Daily  . amoxicillin  500 mg Oral Q12H  . aspirin EC  81 mg Oral Daily  . budesonide (PULMICORT) nebulizer solution  0.25 mg Nebulization BID  . diltiazem  30 mg Oral Q6H  . enoxaparin (LOVENOX) injection  30 mg Subcutaneous Q24H  . feeding supplement (GLUCERNA SHAKE)  237 mL Oral BID BM  . levothyroxine  100 mcg Oral QAC breakfast  . lidocaine  2 patch Transdermal Q24H  . metoprolol succinate  75 mg Oral BID  . pravastatin  20 mg Oral Daily  . psyllium  1 packet Oral Daily  . ranitidine  150 mg Oral BID   Continuous Infusions:  Principal Problem:   Atrial flutter with rapid ventricular response (HCC) Active Problems:   Paroxysmal ventricular tachycardia (HCC)   History of recurrent UTIs   Malnutrition of moderate degree   AKI (acute kidney injury) (HCC)   Dysphagia   DCM (dilated cardiomyopathy) (HCC)   Hyponatremia   Acute on chronic systolic CHF (congestive heart failure) (HCC)    Palliative care by specialist   LOS: 6 days

## 2016-12-26 NOTE — Progress Notes (Signed)
Nutrition Follow-up  DOCUMENTATION CODES:   Not applicable  INTERVENTION:  - Will order Mighty Shake/Hormel Shake once/day, this supplement provides 500 kcal and 22 grams of protein. - Will order Premier Protein BID, each supplement provides 160 kcal and 30 grams of protein.  - Continue to encourage PO intakes of meals and supplements. - Will d/c Glucerna Shake.   NUTRITION DIAGNOSIS:   Inadequate oral intake related to acute illness, decreased appetite as evidenced by per patient/family report. -ongoing  GOAL:   Patient will meet greater than or equal to 90% of their needs -unmet  MONITOR:   PO intake, Supplement acceptance, Weight trends, Labs  REASON FOR ASSESSMENT:   Consult Diet education  ASSESSMENT:   83 year old Caucasian female with a past medical history of chronic systolic CHF with an ejection fraction of 45-50%, esophageal stricture, hypothyroidism, pacemaker, recurrent UTI, history of nephrolithiasis, history of hydronephrosis recently started on amoxicillin by her urologist has been having worsening back pain hip pain decreased oral intake.  On evaluation patient was found to have atrial flutter with RVR.  Patient was hospitalized for further management.  12/19 Per chart review, pt has been eating <50% of meals since last assessment and most meals only consist of 2 food items and a beverage. Spoke mainly with pt's daughter and granddaughter, who were at bedside. Daughter is interested in information concerning current diet order (Dysphagia 2, thin liquids) so that she can prepare appropriate foods at home for pt and so she can show the list to pt. Family reports that pt "does not like to follow the rules" and that she was previously on a similar diet and that after a few days she refused to follow recommendations and would eat whatever she wanted. Provided daughter with a handout from the [employer] outlining appropriate and inappropriate food  options for current diet. GI has recommended no meat and no bread and daughter is aware of this recommendation.  Discussed oral nutrition supplement options given poor oral intake and pt finding many items to be too sweet or too salty. Family interested in pt trying Mighty Shake/Hormel Shake and [employer] Protein. Current weight consistent with admission weight.   Medications reviewed; 1 capsule Risaquad/day, 20 mg oral Lasix/day, 100 mcg oral Synthroid/day, 1 packet Metamucil/day.  Labs reviewed; Na: 134 mmol/L, BUN: 34 mg/dL, creatinine: 0.341.32 mg/dL, Ca: 8.4 mg/dL, GFR: 34 mL/min.     12/14 - No intakes documented since admission.  - Lunch tray in front of pt during RD visit and was sipping milk.  - Pt reports that she usually has a good appetite and eats well but that with being sick she has not felt hungry.  - Most of the time with pt was spent talking about non-nutrition-related things (such as pt's love of travel and places she has been). - Per chart review, her weight has been stable since at least March 2018.  - Physical assessment completed; suspect that wasting is age-related atrophy rather than malnutrition-related.  IVF: NS @ 75 mL/hr.      Diet Order:  DIET DYS 2 Room service appropriate? Yes; Fluid consistency: Thin  EDUCATION NEEDS:   Education needs have been addressed  Skin:  Skin Assessment: Reviewed RN Assessment  Last BM:  12/19  Height:   Ht Readings from Last 1 Encounters:  12/22/16 4' 11.9" (1.521 m)    Weight:   Wt Readings from Last 1 Encounters:  12/26/16 127 lb 10.3 oz (57.9 kg)  Ideal Body Weight:  42.91 kg  BMI:  Body mass index is 25.01 kg/m.  Estimated Nutritional Needs:   Kcal:  1250-1415 (22-25 kcal/kg)  Protein:  45-55 grams  Fluid:  >/= 1.4 L/day     Trenton GammonJessica Delene Morais, MS, RD, LDN, [facility] Inpatient Clinical Dietitian Pager # [phone] After hours/weekend pager # [phone]

## 2016-12-26 NOTE — Progress Notes (Signed)
   12/26/16 1400  Clinical Encounter Type  Visited With Patient and family together  Visit Type Initial  Spiritual Encounters  Spiritual Needs Emotional;Prayer   Rounding on patients on the Palliative list.  Patient was with her granddaughter and granddaughter [place].  Very supportive family system.  Patient indicated she will be going to rehab center.  She shared about her family and her faith.  We prayed together. Will follow as needed. Chaplain Agustin CreeNewton Perina Salvaggio

## 2016-12-26 NOTE — Progress Notes (Signed)
Physical Therapy Treatment Patient Details Name: Tracy MontanaDorothy G Srinivasan MRN: [id number] DOB: April 27, 1921 Today's Date: 12/26/2016    History of Present Illness 83 year old female was admitted for back pain, decreased oral intake and worsening R hip pain, VT, Aflutter. Dx'd with recurrent utis.  H/o scoliosis, CHF, cardiomyopathy    PT Comments    The patient is progressing well. Ambulated x 120' on Ra with sats > 99%.  Very pleasant.   Follow Up Recommendations  SNF     Equipment Recommendations  None recommended by PT    Recommendations for Other Services       Precautions / Restrictions Precautions Precautions: Fall Precaution Comments: monitor vitals    Mobility  Bed Mobility         Supine to sit: Supervision        Transfers Overall transfer level: Needs assistance Equipment used: Rolling walker (2 wheeled) Transfers: Sit to/from Stand Sit to Stand: Min assist         General transfer comment: Increased time. Assist to rise, stabilize. Cues for safety, hand placement.   Ambulation/Gait Ambulation/Gait assistance: Min assist Ambulation Distance (Feet): 120 Feet Assistive device: Rolling walker (2 wheeled) Gait Pattern/deviations: Step-through pattern;Trunk flexed Gait velocity: sats 99% on RA, HR 89, (?jump to 131)   General Gait Details: Assist to stabilize throughout ambulation. Cues for safe/proper use of walker.    Stairs            Wheelchair Mobility    Modified Rankin (Stroke Patients Only)       Balance                                            Cognition Arousal/Alertness: Awake/alert Behavior During Therapy: WFL for tasks assessed/performed                                          Exercises      General Comments        Pertinent Vitals/Pain Faces Pain Scale: Hurts little more Pain Location: back Pain Descriptors / Indicators: Sore Pain Intervention(s): Monitored during  session;Premedicated before session    Home Living                      Prior Function            PT Goals (current goals can now be found in the care plan section)      Frequency    Min 3X/week      PT Plan Current plan remains appropriate    Co-evaluation              AM-PAC PT "6 Clicks" Daily Activity  Outcome Measure  Difficulty turning over in bed (including adjusting bedclothes, sheets and blankets)?: A Little Difficulty moving from lying on back to sitting on the side of the bed? : A Little Difficulty sitting down on and standing up from a chair with arms (e.g., wheelchair, bedside commode, etc,.)?: A Little Help needed moving to and from a bed to chair (including a wheelchair)?: A Little Help needed walking in hospital room?: A Lot Help needed climbing 3-5 steps with a railing? : A Lot 6 Click Score: 16    End of Session   Activity Tolerance: Patient  tolerated treatment well Patient left: with call bell/phone within reach;with family/visitor present;in chair;with chair alarm set Nurse Communication: Mobility status PT Visit Diagnosis: Muscle weakness (generalized) (M62.81);Pain;Difficulty in walking, not elsewhere classified (R26.2) Pain - Right/Left: Right     Time: [id number] PT Time Calculation (min) (ACUTE ONLY): 25 min  Charges:  $Gait Training: 23-37 mins                    G CodesBlanchard Kelch:       Lazette Estala PT [phone] }   Rada HayHill, Maya Scholer [name] 12/26/2016, 5:23 PM

## 2016-12-26 NOTE — Progress Notes (Signed)
Palliative Care Follow-up Note Reason: Symptom Management  Ms. [name] has improved since her admission- she tells me her pain is much better in her hip and she was able to ambulate in the hallway with RN assistance and a walker. Her Afib is under rate control now and she is in the process of being moved to tele from ICU/SD. Her family has arrived and her son-in-law [name] a [place] [facility] has [place] and he is the [place]. I have reviewed her ACP on file but cannot locate HCPOA documents- they will provide those. For now Ms. [name] is making her own health care choices. Dr. Duayne Calekle has shared with me his concerns for her returning home. He is concerned that her memory is much worse than we are seeing during her acute hospitalization. She is fiercely independent and he reports that she was driving just prior to this hospitalization-she has also gotten lost and fell recently when she drove to a restaurant to have dinner.   I discussed with him her goals of care we discussed earlier in the hospitalization- she desires to be at home first and foremost and to not have any additional medical interventions. She has refused esophageal dilation and her PO intake has dropped significantly and she has had weight loss.  Recommendations:  1. Discharge plan needs to be discussed with patient and her family- they are not being pen with each other and it appears to be a major issue in the room- they have been trying to honor her wishes but have serious safety concerns about her living alone. Her granddaughter in law does come everyday and stays with her from AM until bedtime recently.  Driving is a major concern and the patient tells me she is ready to take a road trip to the mountains and the beach.  She is currently very deconditioned- I question if a brief stay at CIR may help her regain her independence and allow for us to further assess her physical and cognitive abilities.  I agree with  family that at least for now I would not support a discharge home unless that was with 24/7 in home caregivers. She really isn't handling her loss of function and independence well.   She will need palliative care to follow at home as well, and if she isnt eating or maintaining her nutrition she may actually be a candidate for hospice services.  2. Pain control- this is much better but she is already telling nurse she doesn't need the "patches anymore" I reiterated that her pain was chronic from her back and hip degeration and that staying ahead of pain was key to controlling it so she has agreed to use these indefinitely as well as the scheduled tylenol.  3. Esophageal Stricture/Presbyesophagus: This has been managed outpatient and she was somewhere along the way told by or understood a provider to tell her that one day her esophagus would just "close up" - she tells me she is done having endoscopy and interventions for this. I am concerned about her ability to maintain her nutrition.I recommend nutrition consult as well as a calorie count to see what she is actually taking in and review her weight loss trend- this is a poor prognostic factor for the very elderly.  I will follow and assist with the discharge plan- on the advice of her HCPOA I will also an assessment of her cognition and capacity for decision making which on the surface appears to be fine.  Tracy ManisElizabeth  Phillips OdorGolding, DO Palliative Medicine [phone]

## 2016-12-26 NOTE — Clinical Social Work Note (Signed)
Clinical Social Work Assessment  Patient Details  Name: [name] MRN: [id number] Date of Birth: 1921/01/22  Date of referral:  12/26/16               Reason for consult:  Facility Placement                Permission sought to share information with:  Oceanographeracility Contact Representative Permission granted to share information::  Yes, Verbal Permission Granted  Name::        Agency::     Relationship::     Contact Information:     Housing/Transportation Living arrangements for the past 2 months:  Single Family Home Source of Information:  Patient, Friend/Neighbor Patient Interpreter Needed:  None Criminal Activity/Legal Involvement Pertinent to Current Situation/Hospitalization:  No - Comment as needed Significant Relationships:  Adult Children, Friend, Other Family Members Lives with:  Self Do you feel safe going back to the place where you live?  (PT recommending SNF) Need for family participation in patient care:  No (Coment)  Care giving concerns:  Patient from home with dog (Suzie Q). Patient reports that her granddaughter comes over Monday-Friday to assist her in the home. Patient reported that she was independent with ADLs and still driving prior to hospitalization. PT recommending SNF.   Social Worker assessment / plan:  CSW spoke with patient/patient's friend at bedside regarding PT recommending SNF. Patient reported that she is agreeable. CSW explained SNF placement process, patient verbalized understanding. Patient reported that her granddaughter would assist her with making a SNF selection.   CSW completed FL2 and will follow up with bed offers.  Employment status:  Retired Health and safety inspectornsurance information:  Medicare PT Recommendations:  Skilled Nursing Facility Information / Referral to community resources:  Skilled Nursing Facility  Patient/Family's Response to care:  [facility]. Patient appreciative of CSW assistance with discharge  planning.  Patient/Family's Understanding of and Emotional Response to Diagnosis, Current Treatment, and Prognosis:  Patient presented pleasant and verbalized understanding of diagnosis and current treatment. Patient verbalized plan to discharge to SNF to complete ST rehab. Patient hopeful to regain independence and returning home after rehab.   Emotional Assessment Appearance:  Appears stated age Attitude/Demeanor/Rapport:  Other(Cooperative) Affect (typically observed):  Pleasant, Hopeful Orientation:  Oriented to Self, Oriented to Place, Oriented to  Time, Oriented to Situation Alcohol / Substance use:  Not Applicable Psych involvement (Current and /or in the community):  No (Comment)  Discharge Needs  Concerns to be addressed:  Care Coordination Readmission within the last 30 days:  No Current discharge risk:  Physical Impairment Barriers to Discharge:  Continued Medical Work up   [employer] CorporationKimberly L Janda Cargo, LCSW 12/26/2016, 12:39 PM

## 2016-12-26 NOTE — NC FL2 (Signed)
[place] MEDICAID FL2 LEVEL OF CARE SCREENING TOOL     IDENTIFICATION  Patient Name: Laurann MontanaDorothy G Spieker Birthdate: [birth year] Sex: female Admission Date (Current Location): 12/20/2016  [facility] [place] [place]   Facility and Address:  [facility],  [address], [place] [id number]      Provider Number: [id number]  Attending Physician Name and Address:  Standley BrookingGoodrich, Daniel P, MD  Relative Name and Phone Number:       Current Level of Care: Hospital Recommended Level of Care: Skilled Nursing Facility Prior Approval Number:    Date Approved/Denied:   PASRR Number: [id number]  Discharge Plan: SNF    Current Diagnoses: Patient Active Problem List   Diagnosis Date Noted  . Palliative care by specialist   . Hyponatremia 12/25/2016  . Acute on chronic systolic CHF (congestive heart failure) (HCC) 12/25/2016  . Atrial flutter with rapid ventricular response (HCC)   . DCM (dilated cardiomyopathy) (HCC)   . AKI (acute kidney injury) (HCC)   . Dysphagia   . Degenerative disc disease, lumbar   . Kidney stone 08/06/2016  . Hydronephrosis   . Malnutrition of moderate degree 07/28/2016  . CAD (coronary artery disease)   . OSA (obstructive sleep apnea) 10/01/2014  . Nocturnal hypoxia 08/27/2014  . Hammer toe 03/06/2012  . Anxiety 01/25/2012  . Cervical dystonia 12/19/2011  . Tremor 12/17/2011  . Osteoporosis 08/07/2011  . Edema 05/24/2011  . Seasonal allergic rhinitis 05/24/2011  . Foot joint pain 04/26/2011  . Memory changes 04/16/2011  . Headache causing frequent awakening from sleep 04/16/2011  . Constipation, slow transit 04/16/2011  . Hypothyroid 02/06/2011  . Biventricular cardiac pacemaker in situ 07/25/2010  . St. Jude Riata lead-series of 1500 07/25/2010  . Paroxysmal ventricular tachycardia (HCC) 11/14/2009  . Chronic cystitis 02/25/2009  . History of recurrent UTIs 02/25/2009  . Female stress incontinence 02/25/2009  .  Postmenopausal atrophic vaginitis 02/25/2009  . Esophageal stricture 09/03/2008  . Hyperlipidemia 10/22/2007  . Essential (primary) hypertension 10/22/2007  . Ischemic cardiomyopathy 10/22/2007  . Chronic systolic heart failure (HCC) 10/22/2007    Orientation RESPIRATION BLADDER Height & Weight     Self, Time, Situation, Place  Normal, Other (Comment)(CPAP Adult Full Face Mask Auto Titrate  Respiratory Rate 18 breaths/minute) Continent Weight: 127 lb 10.3 oz (57.9 kg) Height:  4' 11.9" (152.1 cm)  BEHAVIORAL SYMPTOMS/MOOD NEUROLOGICAL BOWEL NUTRITION STATUS        Diet(DYS 2)  AMBULATORY STATUS COMMUNICATION OF NEEDS Skin   Limited Assist Verbally Normal                       Personal Care Assistance Level of Assistance  Bathing, Feeding, Dressing Bathing Assistance: Maximum assistance Feeding assistance: Independent Dressing Assistance: Maximum assistance     Functional Limitations Info  Sight, Hearing, Speech Sight Info: Adequate Hearing Info: Impaired Speech Info: Adequate    SPECIAL CARE FACTORS FREQUENCY  PT (By licensed PT), OT (By licensed OT)     PT Frequency: 5x OT Frequency: 5x            Contractures Contractures Info: Not present    Additional Factors Info  Code Status, Allergies Code Status Info: DNR Allergies Info: Allergies:  Sulfonamide Derivatives           Current Medications (12/26/2016):  This is the current hospital active medication list Current Facility-Administered Medications  Medication Dose Route Frequency Provider Last Rate Last Dose  . acetaminophen (TYLENOL) tablet 650  mg  650 mg Oral Q6H PRN Maxie BarbBhandari, Dron Prasad, MD       Or  . acetaminophen (TYLENOL) suppository 650 mg  650 mg Rectal Q6H PRN Maxie BarbBhandari, Dron Prasad, MD      . acetaminophen (TYLENOL) tablet 1,000 mg  1,000 mg Oral TID Anderson MaltaGolding, [name] L, DO   1,000 mg at 12/26/16 0957  . acidophilus (RISAQUAD) capsule 1 capsule  1 capsule Oral Daily Maxie BarbBhandari, Dron  Prasad, MD   1 capsule at 12/26/16 0957  . alum & mag hydroxide-simeth (MAALOX/MYLANTA) 200-200-20 MG/5ML suspension 15 mL  15 mL Oral Q6H PRN Esterwood, Amy S, PA-C      . amoxicillin (AMOXIL) 250 MG/5ML suspension 500 mg  500 mg Oral Q12H Maxie BarbBhandari, Dron Prasad, MD   500 mg at 12/25/16 2258  . aspirin EC tablet 81 mg  81 mg Oral Daily Maxie BarbBhandari, Dron Prasad, MD   81 mg at 12/26/16 0957  . budesonide (PULMICORT) nebulizer solution 0.25 mg  0.25 mg Nebulization BID Len ChildsBell, Michelle T, RPH   0.25 mg at 12/26/16 0953  . diltiazem (CARDIZEM) tablet 30 mg  30 mg Oral Q6H Jonelle SidleMcDowell, Samuel G, MD   30 mg at 12/26/16 0636  . diphenhydrAMINE (BENADRYL) capsule 25 mg  25 mg Oral Q6H PRN Maxie BarbBhandari, Dron Prasad, MD      . enoxaparin (LOVENOX) injection 30 mg  30 mg Subcutaneous Q24H Maxie BarbBhandari, Dron Prasad, MD   30 mg at 12/25/16 2258  . feeding supplement (GLUCERNA SHAKE) (GLUCERNA SHAKE) liquid 237 mL  237 mL Oral BID BM Osvaldo ShipperKrishnan, Gokul, MD   237 mL at 12/26/16 0957  . levothyroxine (SYNTHROID, LEVOTHROID) tablet 100 mcg  100 mcg Oral QAC breakfast Maxie BarbBhandari, Dron Prasad, MD   100 mcg at 12/26/16 0845  . lidocaine (LIDODERM) 5 % 2 patch  2 patch Transdermal Q24H Anderson MaltaGolding, [name] L, DO   2 patch at 12/25/16 1408  . metoprolol succinate (TOPROL-XL) 24 hr tablet 75 mg  75 mg Oral BID Osvaldo ShipperKrishnan, Gokul, MD   75 mg at 12/26/16 0957  . ondansetron (ZOFRAN) tablet 4 mg  4 mg Oral Q6H PRN Maxie BarbBhandari, Dron Prasad, MD       Or  . ondansetron [facility](ZOFRAN) injection 4 mg  4 mg Intravenous Q6H PRN Maxie BarbBhandari, Dron Prasad, MD      . oxyCODONE (Oxy IR/ROXICODONE) immediate release tablet 5 mg  5 mg Oral Q6H PRN Maxie BarbBhandari, Dron Prasad, MD   5 mg at 12/22/16 0802  . polyethylene glycol (MIRALAX / GLYCOLAX) packet 17 g  17 g Oral Daily PRN Maxie BarbBhandari, Dron Prasad, MD   17 g at 12/21/16 2227  . pravastatin (PRAVACHOL) tablet 20 mg  20 mg Oral Daily Maxie BarbBhandari, Dron Prasad, MD   20 mg at 12/26/16 0957  . psyllium (HYDROCIL/METAMUCIL) packet 1  packet  1 packet Oral Daily Osvaldo ShipperKrishnan, Gokul, MD   1 packet at 12/26/16 1001  . ranitidine (ZANTAC) 150 MG/10ML syrup 150 mg  150 mg Oral BID Esterwood, Amy S, PA-C   150 mg at 12/26/16 1001     Discharge Medications: Please see discharge summary for a list of discharge medications.  Relevant Imaging Results:  Relevant Lab Results:   Additional Information SSN [id number]  Antionette PolesKimberly L Zeplin Aleshire, LCSW

## 2016-12-27 LAB — CREATININE, SERUM
CREATININE: 1.05 mg/dL — AB (ref 0.44–1.00)
GFR, EST AFRICAN AMERICAN: 51 mL/min — AB (ref >60)
GFR, EST NON AFRICAN AMERICAN: 44 mL/min — AB (ref >60)

## 2016-12-27 MED ORDER — AMOXICILLIN-POT CLAVULANATE 500-125 MG PO TABS: 1.0000 | ORAL_TABLET | Freq: Two times a day (BID) | ORAL | 0 refills | Status: AC

## 2016-12-27 MED ORDER — FLUCONAZOLE 100 MG PO TABS: 100.0000 mg | ORAL_TABLET | Freq: Every day | ORAL | 0 refills | Status: AC

## 2016-12-27 MED ORDER — METOPROLOL SUCCINATE ER 25 MG PO TB24: 75.0000 mg | ORAL_TABLET | Freq: Two times a day (BID) | ORAL | 0 refills | Status: AC

## 2016-12-27 MED ORDER — PREMIER PROTEIN SHAKE: 11.0000 [oz_av] | Freq: Two times a day (BID) | ORAL | 0 refills | Status: AC

## 2016-12-27 MED ORDER — LIDOCAINE 5 % EX PTCH: 2.0000 | MEDICATED_PATCH | CUTANEOUS | 0 refills | Status: AC

## 2016-12-27 MED ORDER — DILTIAZEM HCL ER COATED BEADS 120 MG PO CP24: 120.0000 mg | ORAL_CAPSULE | Freq: Every day | ORAL | 0 refills | Status: AC

## 2016-12-27 MED ORDER — FLUCONAZOLE 100 MG PO TABS
100.0000 mg | ORAL_TABLET | Freq: Every day | ORAL | Status: DC
  Administered 2016-12-27: 100 mg via ORAL
  Filled 2016-12-27: qty 1

## 2016-12-27 MED ORDER — RANITIDINE HCL 150 MG/10ML PO SYRP: 150.0000 mg | ORAL_SOLUTION | Freq: Two times a day (BID) | ORAL | 0 refills | Status: AC

## 2016-12-27 NOTE — Discharge Instructions (Signed)
Summary of Dr. Lamar BlinksGolding's Recommendations:  1. Make an Appointment with [facility] for dilation ASAP 2. Do not Drive. You have a serious issue with your back and weakness which could make you AND OTHERS unsafe. Additionally you have heart issues with Afib which further make driving unsafe.  3. Drink 1/2 cup of 100% cranberry juice at bedtime, needs daily antibiotic low dose for urinary tract infection prevention. 4. Crush Pills 5. Avoid mouthwash with alcohol- consider Closys Rinse 6. When she goes home from rehab call [facility] for palliative consultation. [phone]

## 2016-12-27 NOTE — Progress Notes (Signed)
Attempted to [facility] to give report. No one answered

## 2016-12-27 NOTE — Clinical Social Work Placement (Signed)
[facility]. Facility aware of patient's discharge and confirmed bed offer. PTAR contacted, patient's family notified. Patient's RN can call report to [phone], packet complete. CSW signing off, no other needs identified at this time.  CLINICAL SOCIAL WORK PLACEMENT  NOTE  Date:  12/27/2016  Patient Details  Name: [name] MRN: [id number] Date of Birth: [birth year]  Clinical Social Work is seeking post-discharge placement for this patient at the Skilled  Nursing Facility level of care (*CSW will initial, date and re-position this form in  chart as items are completed):  Yes   Patient/family provided with [facility] Clinical Social Work Department's list of facilities offering this level of care within the geographic area requested by the patient (or if unable, by the patient's family).  Yes   Patient/family informed of their freedom to choose among providers that offer the needed level of care, that participate in Medicare, Medicaid or managed care program needed by the patient, have an available bed and are willing to accept the patient.  Yes   Patient/family informed of [facility]'s ownership interest in [facility] and [facility], as well as of the fact that they are under no obligation to receive care at these facilities.  PASRR submitted to EDS on 12/26/16     PASRR number received on 12/26/16     Existing PASRR number confirmed on       FL2 transmitted to all facilities in geographic area requested by pt/family on 12/26/16     FL2 transmitted to all facilities within larger geographic area on       Patient informed that his/her managed care company has contracts with or will negotiate with certain facilities, including the following:        Yes   Patient/family informed of bed offers received.  [facility]     Physician recommends and patient chooses bed at      [facility].  Patient to be transferred to facility by PTAR     Patient family notified on 12/27/16 of transfer.  Name of family member notified:  Hilaria Otaonna Williams     PHYSICIAN       Additional Comment:    _______________________________________________ Antionette PolesKimberly L Lakeisa Heninger, LCSW 12/27/2016, 3:29 PM

## 2016-12-27 NOTE — Discharge Summary (Addendum)
Physician Discharge Summary  Laurann MontanaDorothy G Harbold ZOX:[id number] DOB: 01/06/1922 DOA: 12/20/2016  PCP: Helane RimaWallace, Erica, DO  Admit date: 12/20/2016 Discharge date: 12/27/2016  Time spent: over 30 minutes  Recommendations for Outpatient Follow-up:  1. Follow up outpatient CBC/CMP  2. Continue palliative care to follow at SNF 3. Follow up with cardiology for HR with Irys Nigh flutter 4. Discontinued losartan given renal function and imdur as well, follow up renal function as outpatient and BP's to see if able to restart (also had low BP's while admitted) 5. Follow up outpatient CXR for RUL opacity concerning for pneumonia (she was asymptomatic here from pulm standpoint 6.  Follow up with urology as outpatient  7. Continue full liquid to mechanical soft diet as noted below.  F/u with Dr. Lavon PaganiniNandigam.  8. Follow up outpatient thyroid studies 9. Continue lidoderm patch and scheduled tylenol for pain   Discharge Diagnoses:  Principal Problem:   Atrial flutter with rapid ventricular response (HCC) Active Problems:   Paroxysmal ventricular tachycardia (HCC)   History of recurrent UTIs   Malnutrition of moderate degree   AKI (acute kidney injury) (HCC)   Dysphagia   DCM (dilated cardiomyopathy) (HCC)   Hyponatremia   Acute on chronic systolic CHF (congestive heart failure) (HCC)   Palliative care by specialist   Discharge Condition: stable  Diet recommendation: full liquid to mechanical soft  Filed Weights   12/22/16 1230 12/24/16 1600 12/26/16 0640  Weight: 62 kg (136 lb 11 oz) 62.4 kg (137 lb 9.1 oz) 57.9 kg (127 lb 10.3 oz)    History of present illness:  89yow presented with gradual decline, worsening back pain, hip pain, decreased oral intake, fall at home with ecchymosis to head. Admitted for new aflutter with RVR, VT, hyponatremia, AKI. Rate control was challenging but eventually responded to BB and CCB,. Hyponatremia resolved with IVF as did AKI. Seen by GI for chronic dysphagia. No  further evaluation suggested, discharged on full liquid to mechanical soft diet. Developed acute on chronic CHF secondary to IVF for AKI. Gradually improving. Plan is for SNF at discharge, continue palliative care as an outpatient.  Hospital Course:  Atrial flutter typical with RVR, new dx. CHA2DS2-VASc 6. Not Maleigh Bagot candidate for anticoagulation per cardiology secondary to falls, frailty. - rate control as rec by cardiology with Toprol XL 75 mg BID and diltiazem 120 mg daily . - continue ASA 12/19  RUL infiltrate. Completely asymptomatic, no apparent clinical significance. No s/s of pneumonia. No further evaluation. Consider repeat CXR as an outpatient.  AKI. Renal u/s showed persistent right-sided hydronephrosis.  - improved.   Acute hyponatremia. Secondary to poor oral intake. - nearly resolved with IVF   Acute on chronic systolic CHF, ischemic cardiomyopathy. Echocardiogram shows LVEF 45-50% - resume oral Lasix - holding losartan and imdur on discharge  Recurrent UTI, nephrolithiasis, hydronephrosis followed by urology. - discussed with urology, urine culture showed resistance to ampicillin, sensitive to augmentin, will d/c with this  S/p PPM; PMH CAD, MI  Esophageal stricture and dysmotilty with chronic dysphagia solid and liquid associated with significant weight loss. - seen by GI, pt declined endoscopy. GI rec full liquid to mechanical soft diet. Avoid meats and bread. Increase supplements to at least BID. Sit upright for meals and stay upright one hour afterward. Zantac 150mg  BID started.  - speech therapy rec Dysphagia 2 (Fine chop);Nectar-thick liquid. Liquid Administration via: Cup;Straw Medication Administration: Crushed with puree [ ]  per palliative, d/c with fluconazole for possible thrush  Hypothyroidism - continue  levothyroxine - TSH slightly elevated, recheck in 4 weeks. No change to Rx now.  Chronic back and hip pain secondary to degenerative disease, multiple  disc disease. - seen by PMT for symptom management, scheduled Tylenol, lidoderm patch, oxycodone PRN  OSA on CPAP at home.  Aortic atherosclerosis.   Procedures: Echo 12/14 Study Conclusions  - Left ventricle: The cavity size was normal. Systolic function was   mildly reduced. The estimated ejection fraction was in the range   of 45% to 50%. Diffuse hypokinesis. - Ventricular septum: Septal motion showed abnormal function and   dyssynergy. - Aortic valve: Trileaflet; mildly thickened, mildly calcified   leaflets. - Mitral valve: Calcified annulus. There was moderate   regurgitation. - Left atrium: The atrium was severely dilated. - Right ventricle: Pacer wire or catheter noted in right ventricle. - Tricuspid valve: There was mild regurgitation. - Pulmonary arteries: Systolic pressure was mildly increased. PA   peak pressure: 44 mm Hg (S).  Impressions:  - Compared to the prior study, there has been no significant   interval change.  Consultations:  Palliative Care  Cardiology  GI   Discharge Exam: Vitals:   12/27/16 0646 12/27/16 0815  BP: 111/64   Pulse: 76   Resp: 18   Temp: 98.3 F (36.8 C)   SpO2: 96% 96%   No CP or SOB.   Decreased appetite.  General: No acute distress. Cardiovascular: Heart sounds show Desiraye Rolfson regular rate, and rhythm. No gallops or rubs. No murmurs. No JVD. Lungs: Clear to auscultation bilaterally with good air movement. No rales, rhonchi or wheezes. Abdomen: Soft, nontender, nondistended with normal active bowel sounds. No masses. No hepatosplenomegaly. Neurological: Alert and oriented 3. Moves all extremities 4 with equal strength. Cranial nerves II through XII grossly intact. Skin: Warm and dry. No rashes or lesions. Extremities: No clubbing or cyanosis. No edema. Psychiatric: Mood and affect are normal. Insight and judgment are appropriate.  Discharge Instructions   Discharge Instructions    Call MD for:  difficulty  breathing, headache or visual disturbances   Complete by:  As directed    Call MD for:  extreme fatigue   Complete by:  As directed    Call MD for:  persistant dizziness or light-headedness   Complete by:  As directed    Call MD for:  persistant nausea and vomiting   Complete by:  As directed    Call MD for:  redness, tenderness, or signs of infection (pain, swelling, redness, odor or green/yellow discharge around incision site)   Complete by:  As directed    Call MD for:  severe uncontrolled pain   Complete by:  As directed    Call MD for:  temperature >100.4   Complete by:  As directed    Discharge instructions   Complete by:  As directed    You were seen for Chalsea Darko flutter with Camauri Fleece fast heart rate.  Your medications were adjusted and your heart rate has improved. Your pain has been improved with scheduled tylenol and the lidocaine patch, please continue these. I discussed your antibiotics for your UTI with urology and we will change this at discharge to augmentin.  Follow up with your provider at the SNF or your PCP regarding when and if to restart the losartan and imdur. Please follow up with your PCP, cardiology, GI, and urology as planned. Return with any new or worsening symptoms.   Continue the modified diet per GI.   Full liquid to mechanical soft  diet.  Avoid meats and bread.  Sit upright for meals and stay upright one hour afterward.  Zantac 150mg  BID started.  Dysphagia 2 diet (Fine chop).  Nectar-thick liquid.  Liquid Administration via Cup;Straw  Medication Administration: Crushed with puree Please have your PCP request records so they know what was done during this hospitalization.   Increase activity slowly   Complete by:  As directed      Allergies as of 12/27/2016      Reactions   Sulfonamide Derivatives Itching, Rash      Medication List    STOP taking these medications   amoxicillin 250 MG/5ML suspension Commonly known as:  AMOXIL   doxycycline 100 MG  capsule Commonly known as:  VIBRAMYCIN   isosorbide mononitrate 30 MG 24 hr tablet Commonly known as:  IMDUR   losartan 50 MG tablet Commonly known as:  COZAAR     TAKE these medications   albuterol 108 (90 Base) MCG/ACT inhaler Commonly known as:  PROVENTIL HFA;VENTOLIN HFA Inhale 2 puffs into the lungs every 6 (six) hours as needed for wheezing or shortness of breath.   amoxicillin-clavulanate 500-125 MG tablet Commonly known as:  AUGMENTIN Take 1 tablet (500 mg total) by mouth 2 (two) times daily for 7 days.   ASPIR-81 81 MG EC tablet Generic drug:  aspirin Take 81 mg by mouth daily.   beclomethasone 40 MCG/ACT inhaler Commonly known as:  QVAR Inhale 2 puffs into the lungs 2 (two) times daily.   bifidobacterium infantis capsule Take 1 capsule by mouth daily.   diltiazem 120 MG 24 hr capsule Commonly known as:  CARDIZEM CD Take 1 capsule (120 mg total) by mouth daily. Start taking on:  12/28/2016   diphenhydrAMINE 25 mg capsule Commonly known as:  BENADRYL Take 25 mg by mouth every 6 (six) hours as needed for itching or allergies.   fluconazole 100 MG tablet Commonly known as:  DIFLUCAN Take 1 tablet (100 mg total) by mouth daily for 10 days. Start taking on:  12/28/2016   furosemide 20 MG tablet Commonly known as:  LASIX Take 1 tablet (20 mg total) by mouth daily as needed for fluid or edema.   levothyroxine 100 MCG tablet Commonly known as:  SYNTHROID, LEVOTHROID TAKE 1 TABLET BY MOUTH IN THE MORNING BEFORE BREAKFAST   lidocaine 5 % Commonly known as:  LIDODERM Place 2 patches onto the skin daily. Remove & Discard patch within 12 hours or as directed by MD   METAFIBER 48.57 % Powd Generic drug:  Psyllium Take 10 mLs by mouth every evening. Mixed with water   metoprolol succinate 25 MG 24 hr tablet Commonly known as:  TOPROL-XL Take 3 tablets (75 mg total) by mouth 2 (two) times daily. What changed:    medication strength  See the new  instructions.   nitroGLYCERIN 0.4 MG SL tablet Commonly known as:  NITROSTAT Place 1 tablet (0.4 mg total) under the tongue every 5 (five) minutes as needed. At the onset of chest pain, Up to 3 doses   NON FORMULARY C-Pap machine, uses nightly for sleep apnea   pravastatin 20 MG tablet Commonly known as:  PRAVACHOL TAKE 1 TABLET BY MOUTH ONCE DAILY   protein supplement shake Liqd Commonly known as:  PREMIER PROTEIN Take 325 mLs (11 oz total) by mouth 2 (two) times daily between meals.   ranitidine 150 MG/10ML syrup Commonly known as:  ZANTAC Take 10 mLs (150 mg total) by mouth 2 (two) times daily.  SALONPAS PAIN RELIEF PATCH EX Apply 1 patch topically daily as needed (pain).   traMADol 50 MG tablet Commonly known as:  ULTRAM Take 50 mg by mouth every 12 (twelve) hours as needed for moderate pain.      Allergies  Allergen Reactions  . Sulfonamide Derivatives Itching and Rash   Follow-up Information    Helane RimaWallace, Erica, DO Follow up.   Specialty:  Family Medicine Contact information: [address]  [place] [address] [phone]        Napoleon FormNandigam, Kavitha V, MD Follow up.   Specialty:  Gastroenterology Contact information: [address]  [place] [phone]        Crist FatHerrick, Benjamin W, MD Follow up.   Specialty:  Urology Contact information: [address] [place] [address] [phone]            The results of significant diagnostics from this hospitalization (including imaging, microbiology, ancillary and laboratory) are listed below for reference.    Significant Diagnostic Studies: Dg Chest 2 View  Result Date: 12/20/2016 CLINICAL DATA:  Congestive heart failure. EXAM: CHEST  2 VIEW COMPARISON:  Radiographs of July 27, 2016. FINDINGS: Stable cardiomediastinal silhouette. Atherosclerosis of thoracic aorta is noted. Stable elevated right hemidiaphragm is noted with mild right basilar subsegmental atelectasis. Stable  large hiatal hernia is noted. Left-sided pacemaker is unchanged in position. Bony thorax is unremarkable. IMPRESSION: Aortic atherosclerosis. Stable elevated right hemidiaphragm with mild right basilar subsegmental atelectasis. Stable large hiatal hernia. Electronically Signed   By: Lupita RaiderJames  Green Jr, M.D.   On: 12/20/2016 16:35   Dg Lumbar Spine Complete  Result Date: 12/20/2016 CLINICAL DATA:  Acute on chronic low back pain after fall several weeks ago. EXAM: LUMBAR SPINE - COMPLETE 4+ VIEW COMPARISON:  CT scan of December 05, 2016. Radiographs of July 28, 2016. FINDINGS: Severe levoscoliosis of lumbar spine is noted. Atherosclerosis of thoracic aorta is noted. No fracture or spondylolisthesis is noted. Severe degenerative disc disease is noted at all levels of the lumbar spine. IMPRESSION: Severe multilevel degenerative disc disease. Severe levoscoliosis of lumbar spine. No acute abnormality is noted. Electronically Signed   By: Lupita RaiderJames  Green Jr, M.D.   On: 12/20/2016 16:44   Ct Head Wo Contrast  Result Date: 12/20/2016 CLINICAL DATA:  Altered level of consciousness. EXAM: CT HEAD WITHOUT CONTRAST TECHNIQUE: Contiguous axial images were obtained from the base of the skull through the vertex without intravenous contrast. COMPARISON:  CT scan of September 27, 2015. FINDINGS: Brain: Mild diffuse cortical atrophy is noted. Mild chronic ischemic white matter disease is noted. No mass effect or midline shift is noted. Ventricular size is within normal limits. There is no evidence of mass lesion, hemorrhage or acute infarction. Vascular: No hyperdense vessel or unexpected calcification. Skull: Normal. Negative for fracture or focal lesion. Sinuses/Orbits: No acute finding. Other: None. IMPRESSION: Mild diffuse cortical atrophy. Mild chronic ischemic white matter disease. No acute intracranial abnormality seen. Electronically Signed   By: Lupita RaiderJames  Green Jr, M.D.   On: 12/20/2016 14:51   [place] Renal  Result Date:  12/21/2016 CLINICAL DATA:  Acute renal failure. EXAM: RENAL / URINARY TRACT ULTRASOUND COMPLETE COMPARISON:  CT 12/05/2016 FINDINGS: Right Kidney: Length: 13.0 cm. Moderate right hydronephrosis. Right renal pelvis measures 2.1 cm. Nonobstructing calculi on prior CT are not well seen sonographically. Left Kidney: Length: 11.2 cm. Echogenicity within normal limits. No mass or hydronephrosis visualized. Bladder: Not visualized.  Bladder stone on prior CT is not seen. IMPRESSION: 1. Persistent  moderate right hydronephrosis, similar to CT 2.5 weeks prior allowing for differences in modality. 2. Bilateral renal calculi on prior CT are not well seen sonographically. 3. Bladder is not visualized, presumably empty. Electronically Signed   By: Rubye Oaks M.D.   On: 12/21/2016 18:40   Dg Chest Port 1 View  Result Date: 12/24/2016 CLINICAL DATA:  Dyspnea. EXAM: PORTABLE CHEST 1 VIEW COMPARISON:  Radiograph of December 22, 2016. FINDINGS: Stable cardiomediastinal silhouette. Atherosclerosis of thoracic aorta is noted. Left-sided pacemaker is unchanged in position. No pneumothorax is noted. Stable elevated right hemidiaphragm. Stable bibasilar subsegmental atelectasis is noted. Increased right upper lobe opacity is noted concerning for inflammation. Bony thorax is unremarkable. IMPRESSION: Aortic atherosclerosis. Stable bibasilar subsegmental atelectasis. Increased right upper lobe opacity is noted concerning for worsening inflammation or pneumonia. Electronically Signed   By: Lupita Raider, M.D.   On: 12/24/2016 08:11   Dg Chest Port 1 View  Result Date: 12/22/2016 CLINICAL DATA:  Dyspnea EXAM: PORTABLE CHEST 1 VIEW COMPARISON:  12/20/2016 FINDINGS: Cardiac shadow is stable. Aortic calcifications are noted. Defibrillator is again seen. Elevation the right hemidiaphragm is again noted. Mild bibasilar atelectatic changes are seen. Hiatal hernia is again noted. IMPRESSION: Bibasilar atelectatic changes.  Electronically Signed   By: Alcide Clever M.D.   On: 12/22/2016 08:49   Dg Hip Unilat W Or Wo Pelvis 2-3 Views Right  Result Date: 12/20/2016 CLINICAL DATA:  Patient fell several weeks ago with worsening right hip pain. EXAM: DG HIP (WITH OR WITHOUT PELVIS) 2-3V RIGHT COMPARISON:  07/28/2016 FINDINGS: Chronic stable joint space narrowing of both hips without fracture or joint dislocation. The pubic rami appear intact. Phleboliths are noted within the pelvis. Tubing projects over the pubic symphysis. Levoconvex curvature of the visualized lower lumbar spine as before. The bony pelvis appears intact. Obturator rings appear intact. IMPRESSION: 1. Uniform bilateral joint space narrowing of the hips without acute osseous abnormality. 2. Levoscoliosis of the lower lumbar spine. Electronically Signed   By: Tollie Eth M.D.   On: 12/20/2016 16:45    Microbiology: Recent Results (from the past 240 hour(s))  MRSA PCR Screening     Status: None   Collection Time: 12/20/16  6:55 PM  Result Value Ref Range Status   MRSA by PCR NEGATIVE NEGATIVE Final    Comment:        The GeneXpert MRSA Assay (FDA approved for NASAL specimens only), is one component of [facility] comprehensive MRSA colonization surveillance program. It is not intended to diagnose MRSA infection nor to guide or monitor treatment for MRSA infections.      Labs: Basic Metabolic Panel: Recent Labs  Lab 12/21/16 0451 12/22/16 0326 12/23/16 0327 12/24/16 0340 12/25/16 0326 12/27/16 0453  NA 130* 133* 134* 132* 134*  --   K 4.4 5.0 5.4* 4.9 3.9  --   CL 103 104 105 102 103  --   CO2 21* 25 24 23 23   --   GLUCOSE 109* 108* 111* 109* 88  --   BUN 53* 43* 37* 39* 34*  --   CREATININE 1.32* 1.21* 1.23* 1.34* 1.32* 1.05*  CALCIUM 8.3* 8.8* 9.1 8.7* 8.4*  --    Liver Function Tests: No results for input(s): AST, ALT, ALKPHOS, BILITOT, PROT, ALBUMIN in the last 168 hours. No results for input(s): LIPASE, AMYLASE in the last 168  hours. No results for input(s): AMMONIA in the last 168 hours. CBC: Recent Labs  Lab 12/21/16 0451 12/22/16 0326 12/23/16 0327 12/24/16  0340 12/25/16 0326  WBC 9.4 8.8 12.1* 11.8* 11.1*  HGB 10.4* 11.0* 11.7* 11.6* 11.4*  HCT 30.1* 33.0* 34.8* 33.9* 33.8*  MCV 89.1 90.7 90.6 89.0 88.7  PLT 221 271 315 332 313   Cardiac Enzymes: No results for input(s): CKTOTAL, CKMB, CKMBINDEX, TROPONINI in the last 168 hours. BNP: BNP (last 3 results) Recent Labs    07/27/16 2041  BNP 603.4*    ProBNP (last 3 results) No results for input(s): PROBNP in the last 8760 hours.  CBG: Recent Labs  Lab 12/25/16 0832  GLUCAP 93       Signed:  Lacretia Nicks MD.  [facility] 12/27/2016, 2:25 PM

## 2016-12-27 NOTE — Care Management Important Message (Signed)
Important Message  Patient Details  Name: [name] MRN: [id number] Date of Birth: [birth year]   Medicare Important Message Given:  Yes    Caren MacadamFuller, Hiilani Jetter 12/27/2016, 10:12 AMImportant Message  Patient Details  Name: Tracy MontanaDorothy G [name] MRN: [id number] Date of Birth: [birth year]   Medicare Important Message Given:  Yes    Caren MacadamFuller, Leala Bryand 12/27/2016, 10:12 AM

## 2016-12-28 ENCOUNTER — Telehealth: Payer: Self-pay | Admitting: *Deleted

## 2016-12-28 DIAGNOSIS — J181 Lobar pneumonia, unspecified organism: Secondary | ICD-10-CM | POA: Diagnosis not present

## 2016-12-28 DIAGNOSIS — N390 Urinary tract infection, site not specified: Secondary | ICD-10-CM | POA: Diagnosis not present

## 2016-12-28 DIAGNOSIS — E039 Hypothyroidism, unspecified: Secondary | ICD-10-CM | POA: Diagnosis not present

## 2016-12-28 DIAGNOSIS — I4892 Unspecified atrial flutter: Secondary | ICD-10-CM | POA: Diagnosis not present

## 2016-12-28 NOTE — Telephone Encounter (Signed)
Attempted TCM call, left voicemail.

## 2017-01-01 ENCOUNTER — Emergency Department (HOSPITAL_COMMUNITY): Payer: Medicare Other

## 2017-01-01 ENCOUNTER — Inpatient Hospital Stay (HOSPITAL_COMMUNITY)
Admission: EM | Admit: 2017-01-01 | Discharge: 2017-01-08 | DRG: 291 | Disposition: E | Payer: Medicare Other | Attending: Family Medicine | Admitting: Family Medicine

## 2017-01-01 ENCOUNTER — Encounter (HOSPITAL_COMMUNITY): Payer: Self-pay | Admitting: Emergency Medicine

## 2017-01-01 DIAGNOSIS — R21 Rash and other nonspecific skin eruption: Secondary | ICD-10-CM | POA: Diagnosis present

## 2017-01-01 DIAGNOSIS — Z79891 Long term (current) use of opiate analgesic: Secondary | ICD-10-CM

## 2017-01-01 DIAGNOSIS — J189 Pneumonia, unspecified organism: Secondary | ICD-10-CM | POA: Diagnosis present

## 2017-01-01 DIAGNOSIS — F419 Anxiety disorder, unspecified: Secondary | ICD-10-CM | POA: Diagnosis present

## 2017-01-01 DIAGNOSIS — Z803 Family history of malignant neoplasm of breast: Secondary | ICD-10-CM

## 2017-01-01 DIAGNOSIS — K222 Esophageal obstruction: Secondary | ICD-10-CM | POA: Diagnosis present

## 2017-01-01 DIAGNOSIS — E785 Hyperlipidemia, unspecified: Secondary | ICD-10-CM | POA: Diagnosis present

## 2017-01-01 DIAGNOSIS — Z8371 Family history of colonic polyps: Secondary | ICD-10-CM

## 2017-01-01 DIAGNOSIS — I4892 Unspecified atrial flutter: Secondary | ICD-10-CM | POA: Diagnosis present

## 2017-01-01 DIAGNOSIS — Z955 Presence of coronary angioplasty implant and graft: Secondary | ICD-10-CM

## 2017-01-01 DIAGNOSIS — M1990 Unspecified osteoarthritis, unspecified site: Secondary | ICD-10-CM | POA: Diagnosis present

## 2017-01-01 DIAGNOSIS — Z9181 History of falling: Secondary | ICD-10-CM

## 2017-01-01 DIAGNOSIS — I472 Ventricular tachycardia: Secondary | ICD-10-CM | POA: Diagnosis present

## 2017-01-01 DIAGNOSIS — R011 Cardiac murmur, unspecified: Secondary | ICD-10-CM | POA: Diagnosis present

## 2017-01-01 DIAGNOSIS — Z96653 Presence of artificial knee joint, bilateral: Secondary | ICD-10-CM | POA: Diagnosis present

## 2017-01-01 DIAGNOSIS — R0602 Shortness of breath: Secondary | ICD-10-CM | POA: Diagnosis not present

## 2017-01-01 DIAGNOSIS — I255 Ischemic cardiomyopathy: Secondary | ICD-10-CM | POA: Diagnosis present

## 2017-01-01 DIAGNOSIS — Z7951 Long term (current) use of inhaled steroids: Secondary | ICD-10-CM

## 2017-01-01 DIAGNOSIS — Z801 Family history of malignant neoplasm of trachea, bronchus and lung: Secondary | ICD-10-CM

## 2017-01-01 DIAGNOSIS — I447 Left bundle-branch block, unspecified: Secondary | ICD-10-CM | POA: Diagnosis present

## 2017-01-01 DIAGNOSIS — G4710 Hypersomnia, unspecified: Secondary | ICD-10-CM | POA: Diagnosis present

## 2017-01-01 DIAGNOSIS — Z66 Do not resuscitate: Secondary | ICD-10-CM | POA: Diagnosis present

## 2017-01-01 DIAGNOSIS — Z833 Family history of diabetes mellitus: Secondary | ICD-10-CM

## 2017-01-01 DIAGNOSIS — I252 Old myocardial infarction: Secondary | ICD-10-CM | POA: Diagnosis not present

## 2017-01-01 DIAGNOSIS — E039 Hypothyroidism, unspecified: Secondary | ICD-10-CM | POA: Diagnosis present

## 2017-01-01 DIAGNOSIS — K219 Gastro-esophageal reflux disease without esophagitis: Secondary | ICD-10-CM | POA: Diagnosis present

## 2017-01-01 DIAGNOSIS — R1900 Intra-abdominal and pelvic swelling, mass and lump, unspecified site: Secondary | ICD-10-CM | POA: Diagnosis present

## 2017-01-01 DIAGNOSIS — R1310 Dysphagia, unspecified: Secondary | ICD-10-CM | POA: Diagnosis present

## 2017-01-01 DIAGNOSIS — I130 Hypertensive heart and chronic kidney disease with heart failure and stage 1 through stage 4 chronic kidney disease, or unspecified chronic kidney disease: Principal | ICD-10-CM | POA: Diagnosis present

## 2017-01-01 DIAGNOSIS — R7989 Other specified abnormal findings of blood chemistry: Secondary | ICD-10-CM | POA: Diagnosis not present

## 2017-01-01 DIAGNOSIS — Z79899 Other long term (current) drug therapy: Secondary | ICD-10-CM

## 2017-01-01 DIAGNOSIS — J9601 Acute respiratory failure with hypoxia: Secondary | ICD-10-CM | POA: Diagnosis present

## 2017-01-01 DIAGNOSIS — I5023 Acute on chronic systolic (congestive) heart failure: Secondary | ICD-10-CM | POA: Diagnosis present

## 2017-01-01 DIAGNOSIS — D631 Anemia in chronic kidney disease: Secondary | ICD-10-CM | POA: Diagnosis present

## 2017-01-01 DIAGNOSIS — I4891 Unspecified atrial fibrillation: Secondary | ICD-10-CM | POA: Diagnosis not present

## 2017-01-01 DIAGNOSIS — I2510 Atherosclerotic heart disease of native coronary artery without angina pectoris: Secondary | ICD-10-CM | POA: Diagnosis present

## 2017-01-01 DIAGNOSIS — I10 Essential (primary) hypertension: Secondary | ICD-10-CM | POA: Diagnosis present

## 2017-01-01 DIAGNOSIS — Z95810 Presence of automatic (implantable) cardiac defibrillator: Secondary | ICD-10-CM

## 2017-01-01 DIAGNOSIS — N183 Chronic kidney disease, stage 3 (moderate): Secondary | ICD-10-CM | POA: Diagnosis present

## 2017-01-01 DIAGNOSIS — Z515 Encounter for palliative care: Secondary | ICD-10-CM | POA: Diagnosis not present

## 2017-01-01 DIAGNOSIS — Z8249 Family history of ischemic heart disease and other diseases of the circulatory system: Secondary | ICD-10-CM

## 2017-01-01 DIAGNOSIS — J9811 Atelectasis: Secondary | ICD-10-CM | POA: Diagnosis present

## 2017-01-01 DIAGNOSIS — Z808 Family history of malignant neoplasm of other organs or systems: Secondary | ICD-10-CM

## 2017-01-01 DIAGNOSIS — Z79890 Hormone replacement therapy (postmenopausal): Secondary | ICD-10-CM

## 2017-01-01 DIAGNOSIS — G4730 Sleep apnea, unspecified: Secondary | ICD-10-CM | POA: Diagnosis present

## 2017-01-01 DIAGNOSIS — B379 Candidiasis, unspecified: Secondary | ICD-10-CM | POA: Diagnosis present

## 2017-01-01 DIAGNOSIS — Z8042 Family history of malignant neoplasm of prostate: Secondary | ICD-10-CM

## 2017-01-01 DIAGNOSIS — Z7982 Long term (current) use of aspirin: Secondary | ICD-10-CM

## 2017-01-01 DIAGNOSIS — Z792 Long term (current) use of antibiotics: Secondary | ICD-10-CM

## 2017-01-01 DIAGNOSIS — Z90710 Acquired absence of both cervix and uterus: Secondary | ICD-10-CM

## 2017-01-01 DIAGNOSIS — Z87440 Personal history of urinary (tract) infections: Secondary | ICD-10-CM

## 2017-01-01 DIAGNOSIS — Z882 Allergy status to sulfonamides status: Secondary | ICD-10-CM

## 2017-01-01 DIAGNOSIS — R000 Tachycardia, unspecified: Secondary | ICD-10-CM | POA: Diagnosis not present

## 2017-01-01 DIAGNOSIS — R071 Chest pain on breathing: Secondary | ICD-10-CM | POA: Diagnosis not present

## 2017-01-01 LAB — CBC WITH DIFFERENTIAL/PLATELET
BASOS PCT: 0 %
Basophils Absolute: 0.1 10*3/uL (ref 0.0–0.1)
EOS PCT: 1 %
Eosinophils Absolute: 0.1 10*3/uL (ref 0.0–0.7)
HEMATOCRIT: 31.8 % — AB (ref 36.0–46.0)
Hemoglobin: 10.4 g/dL — ABNORMAL LOW (ref 12.0–15.0)
Lymphocytes Relative: 19 %
Lymphs Abs: 2.6 10*3/uL (ref 0.7–4.0)
MCH: 29.9 pg (ref 26.0–34.0)
MCHC: 32.7 g/dL (ref 30.0–36.0)
MCV: 91.4 fL (ref 78.0–100.0)
MONO ABS: 1.2 10*3/uL — AB (ref 0.1–1.0)
MONOS PCT: 9 %
NEUTROS ABS: 9.4 10*3/uL — AB (ref 1.7–7.7)
Neutrophils Relative %: 71 %
Platelets: 350 10*3/uL (ref 150–400)
RBC: 3.48 MIL/uL — ABNORMAL LOW (ref 3.87–5.11)
RDW: 13.7 % (ref 11.5–15.5)
WBC: 13.4 10*3/uL — ABNORMAL HIGH (ref 4.0–10.5)

## 2017-01-01 LAB — I-STAT TROPONIN, ED: TROPONIN I, POC: 0 ng/mL (ref 0.00–0.08)

## 2017-01-01 LAB — I-STAT VENOUS BLOOD GAS, ED
ACID-BASE DEFICIT: 1 mmol/L (ref 0.0–2.0)
Bicarbonate: 25.1 mmol/L (ref 20.0–28.0)
O2 Saturation: 40 %
PH VEN: 7.341 (ref 7.250–7.430)
TCO2: 26 mmol/L (ref 22–32)
pCO2, Ven: 46.3 mmHg (ref 44.0–60.0)
pO2, Ven: 25 mmHg — CL (ref 32.0–45.0)

## 2017-01-01 LAB — COMPREHENSIVE METABOLIC PANEL
ALBUMIN: 3 g/dL — AB (ref 3.5–5.0)
ALT: 16 U/L (ref 14–54)
ANION GAP: 10 (ref 5–15)
AST: 27 U/L (ref 15–41)
Alkaline Phosphatase: 68 U/L (ref 38–126)
BILIRUBIN TOTAL: 0.7 mg/dL (ref 0.3–1.2)
BUN: 15 mg/dL (ref 6–20)
CO2: 23 mmol/L (ref 22–32)
Calcium: 9.2 mg/dL (ref 8.9–10.3)
Chloride: 100 mmol/L — ABNORMAL LOW (ref 101–111)
Creatinine, Ser: 1.15 mg/dL — ABNORMAL HIGH (ref 0.44–1.00)
GFR calc Af Amer: 46 mL/min — ABNORMAL LOW (ref >60)
GFR, EST NON AFRICAN AMERICAN: 40 mL/min — AB (ref >60)
Glucose, Bld: 124 mg/dL — ABNORMAL HIGH (ref 65–99)
POTASSIUM: 4.2 mmol/L (ref 3.5–5.1)
Sodium: 133 mmol/L — ABNORMAL LOW (ref 135–145)
TOTAL PROTEIN: 6.5 g/dL (ref 6.5–8.1)

## 2017-01-01 LAB — BLOOD GAS, ARTERIAL
Acid-base deficit: 1.8 mmol/L (ref 0.0–2.0)
Bicarbonate: 22.7 mmol/L (ref 20.0–28.0)
DRAWN BY: 51702
FIO2: 100
O2 SAT: 98.9 %
PATIENT TEMPERATURE: 98.6
PO2 ART: 182 mmHg — AB (ref 83.0–108.0)
pCO2 arterial: 40.7 mmHg (ref 32.0–48.0)
pH, Arterial: 7.366 (ref 7.350–7.450)

## 2017-01-01 LAB — CBC
HCT: 30.4 % — ABNORMAL LOW (ref 36.0–46.0)
Hemoglobin: 9.9 g/dL — ABNORMAL LOW (ref 12.0–15.0)
MCH: 29.6 pg (ref 26.0–34.0)
MCHC: 32.6 g/dL (ref 30.0–36.0)
MCV: 90.7 fL (ref 78.0–100.0)
PLATELETS: 285 10*3/uL (ref 150–400)
RBC: 3.35 MIL/uL — AB (ref 3.87–5.11)
RDW: 13.5 % (ref 11.5–15.5)
WBC: 10.7 10*3/uL — AB (ref 4.0–10.5)

## 2017-01-01 LAB — CREATININE, SERUM
CREATININE: 1.19 mg/dL — AB (ref 0.44–1.00)
GFR calc non Af Amer: 38 mL/min — ABNORMAL LOW (ref >60)
GFR, EST AFRICAN AMERICAN: 44 mL/min — AB (ref >60)

## 2017-01-01 LAB — TSH: TSH: 10.084 u[IU]/mL — AB (ref 0.350–4.500)

## 2017-01-01 LAB — TROPONIN I

## 2017-01-01 LAB — BRAIN NATRIURETIC PEPTIDE: B NATRIURETIC PEPTIDE 5: 764 pg/mL — AB (ref 0.0–100.0)

## 2017-01-01 LAB — D-DIMER, QUANTITATIVE (NOT AT ARMC): D DIMER QUANT: 1.75 ug{FEU}/mL — AB (ref 0.00–0.50)

## 2017-01-01 MED ORDER — FLUCONAZOLE 100 MG PO TABS
100.0000 mg | ORAL_TABLET | Freq: Every day | ORAL | Status: DC
  Administered 2017-01-02 – 2017-01-03 (×2): 100 mg via ORAL
  Filled 2017-01-01 (×3): qty 1

## 2017-01-01 MED ORDER — IPRATROPIUM BROMIDE 0.02 % IN SOLN
RESPIRATORY_TRACT | Status: AC
  Administered 2017-01-01: 18:00:00
  Filled 2017-01-01: qty 2.5

## 2017-01-01 MED ORDER — FAMOTIDINE 20 MG PO TABS
20.0000 mg | ORAL_TABLET | Freq: Two times a day (BID) | ORAL | Status: DC
  Administered 2017-01-02 – 2017-01-03 (×4): 20 mg via ORAL
  Filled 2017-01-01 (×4): qty 1

## 2017-01-01 MED ORDER — ALBUTEROL SULFATE (2.5 MG/3ML) 0.083% IN NEBU
INHALATION_SOLUTION | RESPIRATORY_TRACT | Status: AC
  Administered 2017-01-01: 18:00:00
  Filled 2017-01-01: qty 18

## 2017-01-01 MED ORDER — DILTIAZEM HCL ER COATED BEADS 120 MG PO CP24
120.0000 mg | ORAL_CAPSULE | Freq: Every day | ORAL | Status: DC
  Administered 2017-01-02 – 2017-01-03 (×2): 120 mg via ORAL
  Filled 2017-01-01 (×2): qty 1

## 2017-01-01 MED ORDER — BUDESONIDE 0.25 MG/2ML IN SUSP
0.2500 mg | Freq: Two times a day (BID) | RESPIRATORY_TRACT | Status: DC
  Administered 2017-01-01 – 2017-01-04 (×6): 0.25 mg via RESPIRATORY_TRACT
  Filled 2017-01-01 (×6): qty 2

## 2017-01-01 MED ORDER — DILTIAZEM HCL-DEXTROSE 100-5 MG/100ML-% IV SOLN (PREMIX)
5.0000 mg/h | Freq: Once | INTRAVENOUS | Status: AC
  Administered 2017-01-01: 5 mg/h via INTRAVENOUS

## 2017-01-01 MED ORDER — LEVALBUTEROL HCL 0.63 MG/3ML IN NEBU
0.6300 mg | INHALATION_SOLUTION | Freq: Four times a day (QID) | RESPIRATORY_TRACT | Status: DC
  Administered 2017-01-01: 0.63 mg via RESPIRATORY_TRACT
  Filled 2017-01-01: qty 3

## 2017-01-01 MED ORDER — METOPROLOL SUCCINATE ER 50 MG PO TB24
75.0000 mg | ORAL_TABLET | Freq: Two times a day (BID) | ORAL | Status: DC
  Administered 2017-01-02 – 2017-01-03 (×4): 75 mg via ORAL
  Filled 2017-01-01 (×4): qty 1

## 2017-01-01 MED ORDER — ALBUTEROL (5 MG/ML) CONTINUOUS INHALATION SOLN: 15.0000 mg/h | INHALATION_SOLUTION | Freq: Once | RESPIRATORY_TRACT | Status: DC

## 2017-01-01 MED ORDER — OXYCODONE HCL 5 MG PO TABS: 5.0000 mg | ORAL_TABLET | Freq: Three times a day (TID) | ORAL | Status: DC | PRN

## 2017-01-01 MED ORDER — MAGNESIUM SULFATE 2 GM/50ML IV SOLN
2.0000 g | Freq: Once | INTRAVENOUS | Status: AC
  Administered 2017-01-01: 2 g via INTRAVENOUS
  Filled 2017-01-01: qty 50

## 2017-01-01 MED ORDER — ACETAMINOPHEN 325 MG PO TABS: 650.0000 mg | ORAL_TABLET | Freq: Four times a day (QID) | ORAL | Status: DC | PRN

## 2017-01-01 MED ORDER — LIDOCAINE 5 % EX PTCH
2.0000 | MEDICATED_PATCH | CUTANEOUS | Status: DC
  Administered 2017-01-02 – 2017-01-03 (×2): 2 via TRANSDERMAL
  Filled 2017-01-01 (×3): qty 2

## 2017-01-01 MED ORDER — FUROSEMIDE 10 MG/ML IJ SOLN
40.0000 mg | Freq: Once | INTRAMUSCULAR | Status: AC
  Administered 2017-01-01: 40 mg via INTRAVENOUS
  Filled 2017-01-01: qty 4

## 2017-01-01 MED ORDER — BECLOMETHASONE DIPROPIONATE 40 MCG/ACT IN AERS: 2.0000 | INHALATION_SPRAY | Freq: Two times a day (BID) | RESPIRATORY_TRACT | Status: DC

## 2017-01-01 MED ORDER — ENOXAPARIN SODIUM 40 MG/0.4ML ~~LOC~~ SOLN
40.0000 mg | SUBCUTANEOUS | Status: DC
  Administered 2017-01-01 – 2017-01-03 (×3): 40 mg via SUBCUTANEOUS
  Filled 2017-01-01 (×3): qty 0.4

## 2017-01-01 MED ORDER — LEVOTHYROXINE SODIUM 100 MCG PO TABS
100.0000 ug | ORAL_TABLET | Freq: Every day | ORAL | Status: DC
  Administered 2017-01-02 – 2017-01-03 (×2): 100 ug via ORAL
  Filled 2017-01-01 (×2): qty 1

## 2017-01-01 MED ORDER — NITROGLYCERIN 0.4 MG SL SUBL
0.4000 mg | SUBLINGUAL_TABLET | SUBLINGUAL | Status: DC | PRN
  Administered 2017-01-04 (×2): 0.4 mg via SUBLINGUAL
  Filled 2017-01-01: qty 1

## 2017-01-01 MED ORDER — ASPIRIN EC 81 MG PO TBEC
81.0000 mg | DELAYED_RELEASE_TABLET | Freq: Every day | ORAL | Status: DC
  Administered 2017-01-02 – 2017-01-03 (×2): 81 mg via ORAL
  Filled 2017-01-01 (×2): qty 1

## 2017-01-01 MED ORDER — ONDANSETRON HCL 4 MG PO TABS: 4.0000 mg | ORAL_TABLET | Freq: Four times a day (QID) | ORAL | Status: DC | PRN

## 2017-01-01 MED ORDER — ONDANSETRON HCL 4 MG/2ML IJ SOLN: 4.0000 mg | Freq: Four times a day (QID) | INTRAMUSCULAR | Status: DC | PRN

## 2017-01-01 MED ORDER — LORAZEPAM 2 MG/ML IJ SOLN
0.5000 mg | Freq: Once | INTRAMUSCULAR | Status: AC
  Administered 2017-01-01: 1 mg via INTRAVENOUS
  Filled 2017-01-01: qty 1

## 2017-01-01 MED ORDER — ACETAMINOPHEN 650 MG RE SUPP: 650.0000 mg | Freq: Four times a day (QID) | RECTAL | Status: DC | PRN

## 2017-01-01 MED ORDER — IPRATROPIUM BROMIDE 0.02 % IN SOLN
0.5000 mg | Freq: Once | RESPIRATORY_TRACT | Status: DC
Start: 2017-01-01 — End: 2017-01-01

## 2017-01-01 MED ORDER — DILTIAZEM HCL-DEXTROSE 100-5 MG/100ML-% IV SOLN (PREMIX)
5.0000 mg/h | Freq: Once | INTRAVENOUS | Status: AC
  Administered 2017-01-01: 5 mg/h via INTRAVENOUS
  Filled 2017-01-01: qty 100

## 2017-01-01 MED ORDER — LEVALBUTEROL HCL 0.63 MG/3ML IN NEBU
0.6300 mg | INHALATION_SOLUTION | Freq: Three times a day (TID) | RESPIRATORY_TRACT | Status: DC
  Administered 2017-01-02 – 2017-01-03 (×4): 0.63 mg via RESPIRATORY_TRACT
  Filled 2017-01-01 (×4): qty 3

## 2017-01-01 NOTE — H&P (Signed)
History and Physical    Tracy MontanaDorothy G Shatzer GNF:[id number] DOB: 07-07-1921 DOA: 05-28-16  PCP: Helane RimaWallace, Erica, DO  Patient coming from: Skilled nursing facility.  Chief Complaint: Shortness of breath.  HPI: [name] is a 83 y.o. female with history of CAD status post stenting, systolic heart failure, recently diagnosed atrial flutter was felt not a candidate for anticoagulation secondary to risk of falls, anemia, chronic kidney disease was brought to the ER the patient became suddenly short of breath this evening.  The patient was discharged to skilled nursing facility 5 days ago.  During that admission patient was treated for new onset atrial flutter/fibrillation and was started on Toprol and Cardizem.  Patient also had difficulties with dysphagia but declined EGD.  Patient suddenly became short of breath as per the history provided.  ED Course: Patient was placed on initially BiPAP and BiPAP was eventually weaned off and placed on nonrebreather.  Chest x-ray does not show anything acute.  BNP was elevated at 764.  EKG was showing A. fib with RVR.  Patient was started on Cardizem infusion and Lasix 40 mg IV was given for possible CHF.  At the time of my exam patient is mildly drowsy ABG does not show any carbon dioxide retention.  Patient has per the reports did receive Ativan in the ER.  Review of Systems: As per HPI, rest all negative.   Past Medical History:  Diagnosis Date  . Anxiety   . Arthritis   . CAD (coronary artery disease)    a. h/o anterior wall MI. b. stenting of RCAx2 in 05/2001. c. DES to Cx in 2005 (last cath).  . Chronic systolic CHF (congestive heart failure) (HCC)   . DD (diverticular disease)    Hx of it - severe. Left colon 2004  . Esophageal stricture    last dialated 2007  . GERD (gastroesophageal reflux disease)   . Hiatal hernia   . HTN (hypertension)   . Hyperlipidemia   . Hypothyroidism   . Ischemic cardiomyopathy    EF 25-30% improved to 45%  with biventricular pacing.   [name]. LBBB (left bundle branch block)   . Pacemaker    a. had prior ICD, but this was downgraded to CRT-P in 2017.  [name]. PVT (paroxysmal ventricular tachycardia) (HCC)   . Sleep apnea    has a Cpap machine  . UTI (lower urinary tract infection)    recurrent. (Klebsiella pneumoniae). Last Cx 02/03/09. (R-ancef, nitrofurantoin, Augmentin, Zyosyn)    Past Surgical History:  Procedure Laterality Date  . ANGIOPLASTY     stent  . APPENDECTOMY    . CARDIAC CATHETERIZATION     stent  . CARDIAC DEFIBRILLATOR PLACEMENT     [employer]  . CATARACT EXTRACTION, BILATERAL    . COLONOSCOPY W/ POLYPECTOMY    . EP IMPLANTABLE DEVICE N/A 12/05/2015   Procedure: ICD Generator Changeout;  Surgeon: Duke SalviaSteven C Klein, MD;  [facility];  Service: Cardiovascular;  Laterality: N/A;  . HAMMER TOE SURGERY Left 01/15/2013   Procedure: LEFT SECOND THROUGH FOURTH HAMMERTOE CORRECTION ;  Surgeon: Toni ArthursJohn Hewitt, MD;  Location: [facility] OR;  Service: Orthopedics;  Laterality: Left;  . HAMMERTOE RECONSTRUCTION WITH WEIL OSTEOTOMY Right 07/17/2012   Procedure: RIGHT SECOND AND THIRD MT WEIL OSTEOTOMIES AND SECOND AND THIRD HAMMERTOE CORRECTIONS;  Surgeon: Toni ArthursJohn Hewitt, MD;  Location: [facility] OR;  Service: Orthopedics;  Laterality: Right;  . INSERT / REPLACE / REMOVE PACEMAKER    . TONSILLECTOMY    .  TOTAL ABDOMINAL HYSTERECTOMY    . TOTAL KNEE ARTHROPLASTY Bilateral   . TUBAL LIGATION    . WEIL OSTEOTOMY Left 01/15/2013   Procedure:  WEIL OSTEOTOMY AND DORSAL CAPSULOTOMY;  Surgeon: Toni ArthursJohn Hewitt, MD;  Location: [facility] OR;  Service: Orthopedics;  Laterality: Left;     reports that  has never smoked. she has never used smokeless tobacco. She reports that she does not drink alcohol or use drugs.  Allergies  Allergen Reactions  . Sulfonamide Derivatives Itching and Rash    Family History  Problem Relation Age of Onset  . Heart disease Mother   . Heart disease Father   . Colon polyps Sister   .  Heart disease Brother   . Heart disease Sister   . Prostate cancer Brother   . Diabetes Daughter   . Diabetes Unknown   . Breast cancer Daughter   . Alcohol abuse Son   . Brain cancer Son   . Lung cancer Son   . Colon cancer Neg Hx     Prior to Admission medications   Medication Sig Start Date End Date Taking? Authorizing Provider  acetaminophen (TYLENOL) 500 MG tablet Take 1,000 mg by mouth every 12 (twelve) hours.   Yes [provider]  albuterol (PROVENTIL HFA;VENTOLIN HFA) 108 (90 Base) MCG/ACT inhaler Inhale 2 puffs into the lungs every 6 (six) hours as needed for wheezing or shortness of breath. 08/18/15  Yes Coralyn HellingSood, Vineet, MD  amoxicillin-clavulanate (AUGMENTIN) 500-125 MG tablet Take 1 tablet (500 mg total) by mouth 2 (two) times daily for 7 days. 12/27/16 01/03/17 Yes Zigmund DanielPowell, A Caldwell Jr., MD  aspirin (ASPIR-81) 81 MG EC tablet Take 81 mg by mouth daily.     Yes [provider]  diltiazem (CARDIZEM CD) 120 MG 24 hr capsule Take 1 capsule (120 mg total) by mouth daily. 12/28/16 01/27/17 Yes Zigmund DanielPowell, A Caldwell Jr., MD  diphenhydrAMINE (BENADRYL) 25 mg capsule Take 25 mg by mouth every 6 (six) hours as needed for itching or allergies.   Yes [provider]  fluconazole (DIFLUCAN) 100 MG tablet Take 1 tablet (100 mg total) by mouth daily for 10 days. 12/28/16 01/07/17 Yes Zigmund DanielPowell, A Caldwell Jr., MD  furosemide (LASIX) 20 MG tablet Take 1 tablet (20 mg total) by mouth daily as needed for fluid or edema. Patient taking differently: Take 20 mg by mouth daily as needed ("for fluid retention").  07/30/16  Yes Maxie BarbBhandari, Dron Prasad, MD  levothyroxine (SYNTHROID, LEVOTHROID) 100 MCG tablet TAKE 1 TABLET BY MOUTH IN THE MORNING BEFORE BREAKFAST Patient taking differently: Take 100 mcg by mouth daily before breakfast.  10/30/16  Yes Zola ButtonLowne Chase, Yvonne R, DO  lidocaine (LIDODERM) 5 % Place 2 patches onto the skin daily. Remove & Discard patch within 12 hours or as  directed by MD 12/27/16  Yes Zigmund DanielPowell, A Caldwell Jr., MD  metoprolol succinate (TOPROL-XL) 25 MG 24 hr tablet Take 3 tablets (75 mg total) by mouth 2 (two) times daily. 12/27/16 01/26/17 Yes Zigmund DanielPowell, A Caldwell Jr., MD  nitroGLYCERIN (NITROSTAT) 0.4 MG SL tablet Place 1 tablet (0.4 mg total) under the tongue every 5 (five) minutes as needed. At the onset of chest pain, Up to 3 doses Patient taking differently: Place 0.4 mg under the tongue every 5 (five) minutes x 3 doses as needed for chest pain.  01/31/16  Yes Duke SalviaKlein, Steven C, MD  oxyCODONE (OXY IR/ROXICODONE) 5 MG immediate release tablet Take 5 mg by mouth every 8 (eight) hours as  needed (for pain).   Yes [provider]  pravastatin (PRAVACHOL) 20 MG tablet TAKE 1 TABLET BY MOUTH ONCE DAILY Patient taking differently: Take 20 mg by mouth once a day 09/17/16  Yes Lowne Chase, Yvonne R, DO  ranitidine (ZANTAC) 150 MG tablet Take 150 mg by mouth 2 (two) times daily.   Yes [provider]  beclomethasone (QVAR) 40 MCG/ACT inhaler Inhale 2 puffs into the lungs 2 (two) times daily. 02/18/15   Saguier, Ramon Dredge, PA-C  [facility] PAIN RELIEF PATCH EX) Apply 1 patch topically daily as needed (pain).     [provider]  NON FORMULARY C-Pap machine, uses nightly for sleep apnea    [provider]  protein supplement shake (PREMIER PROTEIN) LIQD Take 325 mLs (11 oz total) by mouth 2 (two) times daily between meals. Patient not taking: Reported on 01/07/2017 12/27/16 01/26/17  Zigmund Daniel., MD  ranitidine (ZANTAC) 150 MG/10ML syrup Take 10 mLs (150 mg total) by mouth 2 (two) times daily. Patient not taking: Reported on 12/15/2016 12/27/16 01/26/17  Zigmund Daniel., MD    Physical Exam: Vitals:   12/11/2016 2000 12/11/2016 2015 12/31/2016 2030 12/20/2016 2100  BP: 116/63 103/60 (!) 106/48 (!) 104/50  Pulse: 86 90 86 77  Resp: 14 14 16 15   SpO2: 100% 100% 100% 100%      Constitutional: Moderately built  and poorly nourished. Vitals:   12/09/2016 2000 12/29/2016 2015 12/27/2016 2030 12/30/2016 2100  BP: 116/63 103/60 (!) 106/48 (!) 104/50  Pulse: 86 90 86 77  Resp: 14 14 16 15   SpO2: 100% 100% 100% 100%   Eyes: Anicteric no pallor. ENMT: No discharge from the ears eyes nose or mouth. Neck: JVD elevated no mass felt. Respiratory: Bilateral air entry present no rhonchi or crepitation appreciated. Cardiovascular: S1-S2 heard no murmurs appreciated. Abdomen: Small mass in the suprapubic area.  The mass appears to be nontender. Musculoskeletal: No edema.  No joint effusion. Skin: Rash. Neurologic: Patient is mildly drowsy but follows commands moves all extremities.  Patient did receive Ativan in the ER. Psychiatric: Mildly drowsy.   Labs on Admission: I have personally reviewed following labs and imaging studies  CBC: Recent Labs  Lab 12/10/2016 1833  WBC 13.4*  NEUTROABS 9.4*  HGB 10.4*  HCT 31.8*  MCV 91.4  PLT 350   Basic Metabolic Panel: Recent Labs  Lab 12/27/16 0453 12/08/2016 1833  NA  --  133*  K  --  4.2  CL  --  100*  CO2  --  23  GLUCOSE  --  124*  BUN  --  15  CREATININE 1.05* 1.15*  CALCIUM  --  9.2   GFR: Estimated Creatinine Clearance: 24.3 mL/min (A) (by C-G formula based on SCr of 1.15 mg/dL (H)). Liver Function Tests: Recent Labs  Lab 12/17/2016 1833  AST 27  ALT 16  ALKPHOS 68  BILITOT 0.7  PROT 6.5  ALBUMIN 3.0*   No results for input(s): LIPASE, AMYLASE in the last 168 hours. No results for input(s): AMMONIA in the last 168 hours. Coagulation Profile: No results for input(s): INR, PROTIME in the last 168 hours. Cardiac Enzymes: No results for input(s): CKTOTAL, CKMB, CKMBINDEX, TROPONINI in the last 168 hours. BNP (last 3 results) No results for input(s): PROBNP in the last 8760 hours. HbA1C: No results for input(s): HGBA1C in the last 72 hours. CBG: No results for input(s): GLUCAP in the last 168 hours. Lipid Profile: No results for  input(s): CHOL, HDL, LDLCALC, TRIG, CHOLHDL, LDLDIRECT in the last 72 hours. Thyroid Function Tests: No results for input(s): TSH, T4TOTAL, FREET4, T3FREE, THYROIDAB in the last 72 hours. Anemia Panel: No results for input(s): VITAMINB12, FOLATE, FERRITIN, TIBC, IRON, RETICCTPCT in the last 72 hours. Urine analysis:    Component Value Date/Time   COLORURINE YELLOW 12/20/2016 1225   APPEARANCEUR CLEAR 12/20/2016 1225   LABSPEC 1.013 12/20/2016 1225   PHURINE 5.0 12/20/2016 1225   GLUCOSEU NEGATIVE 12/20/2016 1225   HGBUR NEGATIVE 12/20/2016 1225   BILIRUBINUR NEGATIVE 12/20/2016 1225   BILIRUBINUR neg 08/06/2016 1240   KETONESUR NEGATIVE 12/20/2016 1225   PROTEINUR NEGATIVE 12/20/2016 1225   UROBILINOGEN 0.2 08/06/2016 1240   UROBILINOGEN 0.2 04/01/2010 1425   NITRITE NEGATIVE 12/20/2016 1225   LEUKOCYTESUR TRACE (A) 12/20/2016 1225   Sepsis Labs: @LABRCNTIP (procalcitonin:4,lacticidven:4) )No results found for this or any previous visit (from the past 240 hour(s)).   Radiological Exams on Admission: Dg Chest Port 1 View  Result Date: 06-Jan-2017 CLINICAL DATA:  83 year old female with shortness of breath. EXAM: PORTABLE CHEST 1 VIEW COMPARISON:  Chest radiograph dated 12/24/2016 FINDINGS: There is shallow inspiration. Mild eventration of the right hemidiaphragm similar to prior radiograph. Bibasilar atelectatic changes. No focal consolidation, pleural effusion, or pneumothorax. Overall the appearance of the lungs is similar to prior radiograph. Stable cardiomegaly. Left pectoral AICD device. Atherosclerotic calcification of the aortic arch. No acute osseous pathology. IMPRESSION: Shallow inspiration with bibasilar atelectasis. No focal consolidation. Electronically Signed   By: Elgie Collard M.D.   On: 06-Jan-2017 19:04    EKG: Independently reviewed.  A. fib with RVR.  Assessment/Plan Principal Problem:   Acute respiratory failure with hypoxia (HCC) Active Problems:    Essential (primary) hypertension   Hypothyroid   Atrial flutter with rapid ventricular response (HCC)   Acute on chronic systolic CHF (congestive heart failure) (HCC)    1. Acute respiratory failure with hypoxia -cause not clear.  Suspect CHF for which patient was given Lasix 40 mg IV in the ER.  Based on the response and blood pressure trends for the dose of Lasix to be decided.  Since patient symptoms were acute onset I have ordered a d-dimer and if positive will need VQ scan.  CPAP at bedtime for sleep apnea.  I would also check pro-calcitonin level lactic acid levels and for now we will keep patient on empiric antibiotics for pneumonia since patient has history of dysphagia.  If procalcitonin is negative we will can discontinue antibiotics. 2. A. fib with RVR -on Cardizem infusion which can be tapered off once patient takes Toprol and p.o. Cardizem.  Check TSH. 3. History of systolic heart failure last EF measured 2 weeks ago was 45-50%.  See #1. 4. Chronic kidney disease stage III -creatinine appears to be at baseline. 5. Normocytic normochromic anemia -follow CBC. 6. Hypothyroidism on Synthroid -check TSH.  Since patient is in A. fib with RVR. 7. History of dysphagia on dysphagia 2 diet.  Patient had declined EGD during last admission.  Patient on fluconazole for thrush. 8. Suprapubic mass -appears to be nontender.  Once patient is more stable may consider further imaging.   DVT prophylaxis: Lovenox. Code Status: DNR. Family Communication: No family at the bedside. Disposition Plan: To be determined. Consults called: None. Admission status: Inpatient.   Eduard Clos MD [facility] Pager [phone].  If 7PM-7AM, please contact night-coverage [email or web address] Password [facility]  2017-01-06, 9:23 PM

## 2017-01-01 NOTE — ED Notes (Signed)
MD at bedside updating patient and family.  

## 2017-01-01 NOTE — Progress Notes (Signed)
Patient was receiving neb on arrival, appeared SOB, shaky, bbs clear diminished.  15mg  CAT started per MD order.  Patient received about 15 minutes of neb before treatment was terminiated due to arrhythmias, a-fib.  Currently BBS clear, only slightly diminished in bases.

## 2017-01-01 NOTE — ED Notes (Signed)
Spoke to lab to add d-dimer, will add

## 2017-01-01 NOTE — ED Triage Notes (Signed)
Per EMS:  Pt presents to ED after facility called for increasing SOB.  Bilateral lower limb swelling noted as well.  Pt placed on CPAP en route.  Given 125 solu-medrol, 0.5 of atrovent, and 10mg  of albuterol.

## 2017-01-01 NOTE — ED Notes (Signed)
Radiology at bedside

## 2017-01-01 NOTE — ED Provider Notes (Signed)
[facility] EMERGENCY DEPARTMENT Provider Note   CSN: [id number] Arrival date & time: 2017/01/10  1809     History   Chief Complaint Chief Complaint  Patient presents with  . Shortness of Breath    HPI [name] is a 83 y.o. female hx of CAD, CHF with EF 35%, GERD, HTN, here presenting with shortness of breath.  Patient was recently admitted to hospital for rapid A. fib, CHF exacerbation.  Patient is back at [facility].  Family visited today and she was doing well at that after they left, patient had sudden onset of shortness of breath.  Patient was noted to be hypoxic and EMS put patient on BiPAP for about 15 minutes.  Patient then given 2 nebs and Solu-Medrol. Patient was noted to have bilateral leg edema.   The history is provided by the patient.    Past Medical History:  Diagnosis Date  . Anxiety   . Arthritis   . CAD (coronary artery disease)    a. h/o anterior wall MI. b. stenting of RCAx2 in 05/2001. c. DES to Cx in 2005 (last cath).  . Chronic systolic CHF (congestive heart failure) (HCC)   . DD (diverticular disease)    Hx of it - severe. Left colon 2004  . Esophageal stricture    last dialated 2007  . GERD (gastroesophageal reflux disease)   . Hiatal hernia   . HTN (hypertension)   . Hyperlipidemia   . Hypothyroidism   . Ischemic cardiomyopathy    EF 25-30% improved to 45% with biventricular pacing.   [name] LBBB (left bundle branch block)   . Pacemaker    a. had prior ICD, but this was downgraded to CRT-P in 2017.  [name] PVT (paroxysmal ventricular tachycardia) (HCC)   . Sleep apnea    has a Cpap machine  . UTI (lower urinary tract infection)    recurrent. (Klebsiella pneumoniae). Last Cx 02/03/09. (R-ancef, nitrofurantoin, Augmentin, Zyosyn)    Patient Active Problem List   Diagnosis Date Noted  . Palliative care by specialist   . Hyponatremia 12/25/2016  . Acute on chronic systolic CHF (congestive heart failure) (HCC) 12/25/2016  .  Atrial flutter with rapid ventricular response (HCC)   . DCM (dilated cardiomyopathy) (HCC)   . AKI (acute kidney injury) (HCC)   . Dysphagia   . Degenerative disc disease, lumbar   . Kidney stone 08/06/2016  . Hydronephrosis   . Malnutrition of moderate degree 07/28/2016  . CAD (coronary artery disease)   . OSA (obstructive sleep apnea) 10/01/2014  . Nocturnal hypoxia 08/27/2014  . Hammer toe 03/06/2012  . Anxiety 01/25/2012  . Cervical dystonia 12/19/2011  . Tremor 12/17/2011  . Osteoporosis 08/07/2011  . Edema 05/24/2011  . Seasonal allergic rhinitis 05/24/2011  . Foot joint pain 04/26/2011  . Memory changes 04/16/2011  . Headache causing frequent awakening from sleep 04/16/2011  . Constipation, slow transit 04/16/2011  . Hypothyroid 02/06/2011  . Biventricular cardiac pacemaker in situ 07/25/2010  . St. Jude Riata lead-series of 1500 07/25/2010  . Paroxysmal ventricular tachycardia (HCC) 11/14/2009  . Chronic cystitis 02/25/2009  . History of recurrent UTIs 02/25/2009  . Female stress incontinence 02/25/2009  . Postmenopausal atrophic vaginitis 02/25/2009  . Esophageal stricture 09/03/2008  . Hyperlipidemia 10/22/2007  . Essential (primary) hypertension 10/22/2007  . Ischemic cardiomyopathy 10/22/2007  . Chronic systolic heart failure (HCC) 10/22/2007    Past Surgical History:  Procedure Laterality Date  . ANGIOPLASTY     stent  .  APPENDECTOMY    . CARDIAC CATHETERIZATION     stent  . CARDIAC DEFIBRILLATOR PLACEMENT     St Jude Atlas  . CATARACT EXTRACTION, BILATERAL    . COLONOSCOPY W/ POLYPECTOMY    . EP IMPLANTABLE DEVICE N/A 12/05/2015   Procedure: ICD Generator Changeout;  Surgeon: Duke SalviaSteven C Klein, MD;  [facility];  Service: Cardiovascular;  Laterality: N/A;  . HAMMER TOE SURGERY Left 01/15/2013   Procedure: LEFT SECOND THROUGH FOURTH HAMMERTOE CORRECTION ;  Surgeon: Toni ArthursJohn Hewitt, MD;  Location: [facility] OR;  Service: Orthopedics;  Laterality: Left;   . HAMMERTOE RECONSTRUCTION WITH WEIL OSTEOTOMY Right 07/17/2012   Procedure: RIGHT SECOND AND THIRD MT WEIL OSTEOTOMIES AND SECOND AND THIRD HAMMERTOE CORRECTIONS;  Surgeon: Toni ArthursJohn Hewitt, MD;  Location: [facility] OR;  Service: Orthopedics;  Laterality: Right;  . INSERT / REPLACE / REMOVE PACEMAKER    . TONSILLECTOMY    . TOTAL ABDOMINAL HYSTERECTOMY    . TOTAL KNEE ARTHROPLASTY Bilateral   . TUBAL LIGATION    . WEIL OSTEOTOMY Left 01/15/2013   Procedure:  WEIL OSTEOTOMY AND DORSAL CAPSULOTOMY;  Surgeon: Toni ArthursJohn Hewitt, MD;  Location: [facility] OR;  Service: Orthopedics;  Laterality: Left;    OB History    No data available       Home Medications    Prior to Admission medications   Medication Sig Start Date End Date Taking? Authorizing Provider  albuterol (PROVENTIL HFA;VENTOLIN HFA) 108 (90 Base) MCG/ACT inhaler Inhale 2 puffs into the lungs every 6 (six) hours as needed for wheezing or shortness of breath. 08/18/15   Coralyn HellingSood, Vineet, MD  amoxicillin-clavulanate (AUGMENTIN) 500-125 MG tablet Take 1 tablet (500 mg total) by mouth 2 (two) times daily for 7 days. 12/27/16 01/03/17  Zigmund DanielPowell, A Caldwell Jr., MD  aspirin (ASPIR-81) 81 MG EC tablet Take 81 mg by mouth daily.      [provider]  beclomethasone (QVAR) 40 MCG/ACT inhaler Inhale 2 puffs into the lungs 2 (two) times daily. 02/18/15   Saguier, Ramon DredgeEdward, PA-C  bifidobacterium infantis (ALIGN) capsule Take 1 capsule by mouth daily.    [provider]  diltiazem (CARDIZEM CD) 120 MG 24 hr capsule Take 1 capsule (120 mg total) by mouth daily. 12/28/16 01/27/17  Zigmund DanielPowell, A Caldwell Jr., MD  diphenhydrAMINE (BENADRYL) 25 mg capsule Take 25 mg by mouth every 6 (six) hours as needed for itching or allergies.    [provider]  fluconazole (DIFLUCAN) 100 MG tablet Take 1 tablet (100 mg total) by mouth daily for 10 days. 12/28/16 01/07/17  Zigmund DanielPowell, A Caldwell Jr., MD  furosemide (LASIX) 20 MG tablet Take 1 tablet (20 mg total) by mouth daily  as needed for fluid or edema. 07/30/16   Maxie BarbBhandari, Dron Prasad, MD  levothyroxine (SYNTHROID, LEVOTHROID) 100 MCG tablet TAKE 1 TABLET BY MOUTH IN THE MORNING BEFORE BREAKFAST 10/30/16   Zola ButtonLowne Chase, Yvonne R, DO  lidocaine (LIDODERM) 5 % Place 2 patches onto the skin daily. Remove & Discard patch within 12 hours or as directed by MD 12/27/16   Zigmund DanielPowell, A Caldwell Jr., MD  [facility](SALONPAS PAIN RELIEF PATCH EX) Apply 1 patch topically daily as needed (pain).     [provider]  metoprolol succinate (TOPROL-XL) 25 MG 24 hr tablet Take 3 tablets (75 mg total) by mouth 2 (two) times daily. 12/27/16 01/26/17  Zigmund DanielPowell, A Caldwell Jr., MD  nitroGLYCERIN (NITROSTAT) 0.4 MG SL tablet Place 1 tablet (0.4 mg total) under the tongue every  5 (five) minutes as needed. At the onset of chest pain, Up to 3 doses 01/31/16   Duke Salvia, MD  NON FORMULARY C-Pap machine, uses nightly for sleep apnea    [provider]  pravastatin (PRAVACHOL) 20 MG tablet TAKE 1 TABLET BY MOUTH ONCE DAILY 09/17/16   Zola Button, Grayling Congress, DO  protein supplement shake (PREMIER PROTEIN) LIQD Take 325 mLs (11 oz total) by mouth 2 (two) times daily between meals. 12/27/16 01/26/17  Zigmund Daniel., MD  Psyllium (METAFIBER) 48.57 % POWD Take 10 mLs by mouth every evening. Mixed with water    [provider]  ranitidine (ZANTAC) 150 MG/10ML syrup Take 10 mLs (150 mg total) by mouth 2 (two) times daily. 12/27/16 01/26/17  Zigmund Daniel., MD  traMADol (ULTRAM) 50 MG tablet Take 50 mg by mouth every 12 (twelve) hours as needed for moderate pain.    [provider]    Family History Family History  Problem Relation Age of Onset  . Heart disease Mother   . Heart disease Father   . Colon polyps Sister   . Heart disease Brother   . Heart disease Sister   . Prostate cancer Brother   . Diabetes Daughter   . Diabetes Unknown   . Breast cancer Daughter   . Alcohol abuse Son   . Brain cancer  Son   . Lung cancer Son   . Colon cancer Neg Hx     Social History Social History   Tobacco Use  . Smoking status: Never Smoker  . Smokeless tobacco: Never Used  Substance Use Topics  . Alcohol use: No    Alcohol/week: 0.0 oz  . Drug use: No     Allergies   Sulfonamide derivatives   Review of Systems Review of Systems  Respiratory: Positive for shortness of breath.   All other systems reviewed and are negative.    Physical Exam Updated Vital Signs BP 138/83   Pulse 99   Resp 15   SpO2 100%   Physical Exam  Constitutional:  Chronically ill,   HENT:  Head: Normocephalic.  Mouth/Throat: Oropharynx is clear and moist.  Eyes: EOM are normal. Pupils are equal, round, and reactive to light.  Neck: Normal range of motion.  Cardiovascular:  Tachycardic, irregular   Pulmonary/Chest: Breath sounds normal.  Diminished bilateral bases   Abdominal: Soft.  Musculoskeletal: Normal range of motion.       Right lower leg: She exhibits edema.       Left lower leg: She exhibits edema.  Neurological: She is alert.  Skin: Skin is warm.  Nursing note and vitals reviewed.    ED Treatments / Results  Labs (all labs ordered are listed, but only abnormal results are displayed) Labs Reviewed  CBC WITH DIFFERENTIAL/PLATELET - Abnormal; Notable for the following components:      Result Value   WBC 13.4 (*)    RBC 3.48 (*)    Hemoglobin 10.4 (*)    HCT 31.8 (*)    Neutro Abs 9.4 (*)    Monocytes Absolute 1.2 (*)    All other components within normal limits  COMPREHENSIVE METABOLIC PANEL - Abnormal; Notable for the following components:   Sodium 133 (*)    Chloride 100 (*)    Glucose, Bld 124 (*)    Creatinine, Ser 1.15 (*)    Albumin 3.0 (*)    GFR calc non Af Amer 40 (*)    GFR calc  Af Amer 46 (*)    All other components within normal limits  I-STAT VENOUS BLOOD GAS, ED - Abnormal; Notable for the following components:   pO2, Ven 25.0 (*)    All other components  within normal limits  BRAIN NATRIURETIC PEPTIDE  BLOOD GAS, VENOUS  I-STAT TROPONIN, ED    EKG  EKG Interpretation  Date/Time:  Tuesday January 01 2017 18:32:08 EST Ventricular Rate:  113 PR Interval:    QRS Duration: 127 QT Interval:  346 QTC Calculation: 440 R Axis:   133 Text Interpretation:  Atrial fibrillation Ventricular tachycardia, unsustained IVCD, consider atypical RBBB Consider left ventricular hypertrophy Anterior Q waves, possibly due to LVH Nonspecific T abnormalities, lateral leads No significant change since last tracing Confirmed by Richardean CanalYao, David H ([id number]) on 12/23/2016 6:53:29 PM       Radiology Dg Chest Port 1 View  Result Date: 01/02/2017 CLINICAL DATA:  83 year old female with shortness of breath. EXAM: PORTABLE CHEST 1 VIEW COMPARISON:  Chest radiograph dated 12/24/2016 FINDINGS: There is shallow inspiration. Mild eventration of the right hemidiaphragm similar to prior radiograph. Bibasilar atelectatic changes. No focal consolidation, pleural effusion, or pneumothorax. Overall the appearance of the lungs is similar to prior radiograph. Stable cardiomegaly. Left pectoral AICD device. Atherosclerotic calcification of the aortic arch. No acute osseous pathology. IMPRESSION: Shallow inspiration with bibasilar atelectasis. No focal consolidation. Electronically Signed   By: Elgie CollardArash  Radparvar M.D.   On: 01/02/2017 19:04    Procedures Procedures (including critical care time)  CRITICAL CARE Performed by: Richardean Canalavid H Yao   Total critical care time: 30 minutes  Critical care time was exclusive of separately billable procedures and treating other patients.  Critical care was necessary to treat or prevent imminent or life-threatening deterioration.  Critical care was time spent personally by me on the following activities: development of treatment plan with patient and/or surrogate as well as nursing, discussions with consultants, evaluation of patient's response to  treatment, examination of patient, obtaining history from patient or surrogate, ordering and performing treatments and interventions, ordering and review of laboratory studies, ordering and review of radiographic studies, pulse oximetry and re-evaluation of patient's condition.   Medications Ordered in ED Medications  albuterol (PROVENTIL,VENTOLIN) solution continuous neb (15 mg/hr Nebulization Not Given 12/13/2016 1835)  ipratropium (ATROVENT) nebulizer solution 0.5 mg (0.5 mg Nebulization Not Given 12/26/2016 1835)  magnesium sulfate IVPB 2 g 50 mL (0 g Intravenous Stopped 01/07/2017 2007)  ipratropium (ATROVENT) 0.02 % nebulizer solution (  Given 12/26/2016 1815)  albuterol (PROVENTIL) (2.5 MG/3ML) 0.083% nebulizer solution (  Given 12/14/2016 1815)  LORazepam (ATIVAN) injection 0.5 mg (1 mg Intravenous Given 12/14/2016 1830)  diltiazem (CARDIZEM) 100 mg in dextrose 5% 100mL (1 mg/mL) infusion (5 mg/hr Intravenous New Bag/Given 12/21/2016 1840)     Initial Impression / Assessment and Plan / ED Course  I have reviewed the triage vital signs and the nursing notes.  Pertinent labs & imaging results that were available during my care of the patient were reviewed by me and considered in my medical decision making (see chart for details).    Laurann MontanaDorothy G Seufert is a 83 y.o. female here with SOB, leg swelling. Patient was in rapid afib on arrival, rate 130-140s. Recently admitted for afib. Patient CHADVASC 6 but not on anticoagulation due to falls. Will start on cardizem drip. Will get labs, CXR.   8:24 PM CXR stable. BNP 700. On cardizem drip and HR upper 90s to low 100s. Given lasix. Will admit for  rapid afib, CHF exacerbation    Final Clinical Impressions(s) / ED Diagnoses   Final diagnoses:  None    ED Discharge Orders    None       Charlynne Pander, MD 12/20/2016 2027

## 2017-01-02 ENCOUNTER — Inpatient Hospital Stay (HOSPITAL_COMMUNITY): Payer: Medicare Other

## 2017-01-02 ENCOUNTER — Other Ambulatory Visit: Payer: Self-pay

## 2017-01-02 LAB — TROPONIN I

## 2017-01-02 LAB — CBC
HCT: 32 % — ABNORMAL LOW (ref 36.0–46.0)
HEMOGLOBIN: 10.8 g/dL — AB (ref 12.0–15.0)
MCH: 30.5 pg (ref 26.0–34.0)
MCHC: 33.8 g/dL (ref 30.0–36.0)
MCV: 90.4 fL (ref 78.0–100.0)
PLATELETS: 317 10*3/uL (ref 150–400)
RBC: 3.54 MIL/uL — ABNORMAL LOW (ref 3.87–5.11)
RDW: 14 % (ref 11.5–15.5)
WBC: 7.5 10*3/uL (ref 4.0–10.5)

## 2017-01-02 LAB — INFLUENZA PANEL BY PCR (TYPE A & B)
Influenza A By PCR: NEGATIVE
Influenza B By PCR: NEGATIVE

## 2017-01-02 LAB — BASIC METABOLIC PANEL
ANION GAP: 12 (ref 5–15)
BUN: 14 mg/dL (ref 6–20)
CALCIUM: 9 mg/dL (ref 8.9–10.3)
CO2: 24 mmol/L (ref 22–32)
CREATININE: 1.13 mg/dL — AB (ref 0.44–1.00)
Chloride: 97 mmol/L — ABNORMAL LOW (ref 101–111)
GFR calc Af Amer: 47 mL/min — ABNORMAL LOW (ref >60)
GFR, EST NON AFRICAN AMERICAN: 41 mL/min — AB (ref >60)
GLUCOSE: 175 mg/dL — AB (ref 65–99)
Potassium: 4.1 mmol/L (ref 3.5–5.1)
Sodium: 133 mmol/L — ABNORMAL LOW (ref 135–145)

## 2017-01-02 LAB — PROCALCITONIN

## 2017-01-02 LAB — MRSA PCR SCREENING: MRSA BY PCR: NEGATIVE

## 2017-01-02 LAB — LACTIC ACID, PLASMA
LACTIC ACID, VENOUS: 1.2 mmol/L (ref 0.5–1.9)
Lactic Acid, Venous: 1.9 mmol/L (ref 0.5–1.9)

## 2017-01-02 MED ORDER — PIPERACILLIN-TAZOBACTAM 3.375 G IVPB
3.3750 g | Freq: Three times a day (TID) | INTRAVENOUS | Status: DC
  Administered 2017-01-02: 3.375 g via INTRAVENOUS
  Filled 2017-01-02 (×2): qty 50

## 2017-01-02 MED ORDER — VANCOMYCIN HCL 500 MG IV SOLR: 500.0000 mg | INTRAVENOUS | Status: DC

## 2017-01-02 MED ORDER — BOOST PLUS PO LIQD
237.0000 mL | Freq: Two times a day (BID) | ORAL | Status: DC
  Administered 2017-01-03 (×2): 237 mL via ORAL
  Filled 2017-01-02 (×7): qty 237

## 2017-01-02 MED ORDER — VANCOMYCIN HCL IN DEXTROSE 1-5 GM/200ML-% IV SOLN
1000.0000 mg | Freq: Once | INTRAVENOUS | Status: AC
  Administered 2017-01-02: 1000 mg via INTRAVENOUS
  Filled 2017-01-02: qty 200

## 2017-01-02 MED ORDER — FUROSEMIDE 10 MG/ML IJ SOLN: 40.0000 mg | Freq: Once | INTRAMUSCULAR | Status: DC

## 2017-01-02 MED ORDER — IOPAMIDOL (ISOVUE-370) INJECTION 76%
INTRAVENOUS | Status: AC
  Filled 2017-01-02: qty 100

## 2017-01-02 MED ORDER — FUROSEMIDE 10 MG/ML IJ SOLN
20.0000 mg | Freq: Once | INTRAMUSCULAR | Status: AC
  Administered 2017-01-02: 20 mg via INTRAVENOUS
  Filled 2017-01-02: qty 2

## 2017-01-02 MED ORDER — FUROSEMIDE 20 MG PO TABS
20.0000 mg | ORAL_TABLET | Freq: Every day | ORAL | Status: DC
  Administered 2017-01-03: 20 mg via ORAL
  Filled 2017-01-02: qty 1

## 2017-01-02 MED ORDER — ENSURE ENLIVE PO LIQD: 237.0000 mL | Freq: Two times a day (BID) | ORAL | Status: DC

## 2017-01-02 MED ORDER — METHYLPREDNISOLONE SODIUM SUCC 125 MG IJ SOLR
60.0000 mg | Freq: Every day | INTRAMUSCULAR | Status: DC
  Administered 2017-01-02: 60 mg via INTRAVENOUS
  Filled 2017-01-02: qty 2

## 2017-01-02 NOTE — Progress Notes (Addendum)
Pharmacy Antibiotic Note  [name] is a 83 y.o. female admitted on Apr 10, 2016 with suspected pneumonia.  Pharmacy has been consulted for vancomycin and zosyn dosing. Patient was recently discharged from hospital to SNF.   Tmax 97.8, WBC 10.7. SCr 1.19 for estimated CrCl ~ 20-25 mL/min.   Plan: Vancomycin 1g IV x1, then 500 mg IV q24hr Zosyn 3.375g IV q8hr Vancomycin trough at SS and PRN (goal 15-20 mcg/mL) Monitor renal function, clinical picture, and culture data F/u length of therapy, PCT, de-escalation    Height: 4\' 11"  (149.9 cm) Weight: 131 lb 9.8 oz (59.7 kg) IBW/kg (Calculated) : 43.2  Temp (24hrs), Avg:97.7 F (36.5 C), Min:97.5 F (36.4 C), Max:97.8 F (36.6 C)  Recent Labs  Lab 12/27/16 0453 02-25-2016 1833 02-25-2016 2144  WBC  --  13.4* 10.7*  CREATININE 1.05* 1.15* 1.19*    Estimated Creatinine Clearance: 23.2 mL/min (A) (by C-G formula based on SCr of 1.19 mg/dL (H)).    Allergies  Allergen Reactions  . Sulfonamide Derivatives Itching and Rash    Antimicrobials this admission: 12/26 Vanc >>  12/26 Zosyn >>   Microbiology results: pending   Einar CrowKatherine Weigle, PharmD Clinical Pharmacist 01/02/17 6:10 AM

## 2017-01-02 NOTE — Progress Notes (Signed)
[facility] Progress Note  Patient: Tracy MontanaDorothy G Cravens ZOX:[id number]   PCP: Helane RimaWallace, Erica, DO DOB: Feb 10, 1921   DOA: 2016-08-31   DOS: 01/02/2017   Date of Service: the patient was seen and examined on 01/02/2017  Subjective: Feeling better but continues to have shortness of breath as well as cough.  No chest pain no abdominal pain no nausea no vomiting.  Brief hospital course: Pt. with PMH of CAD S/P PCI, chronic systolic CHF, a flutter not on any anticoagulation due to fall risk, anemia, CKD, chronic dysphagia; admitted on 2016-08-31, presented with complaint of shortness of breath, was found to have acute on chronic systolic CHF due to a flutter with RVR. Currently further plan is continue IV Lasix and monitor.  Assessment and Plan: 1.  Acute hypoxic respiratory failure. Acute on chronic systolic CHF. A. fib with RVR. Most likely patient went into A. fib with RVR that led to CHF exacerbation. Received IV Lasix with significant improvement in her respiration right now. Continue IV Lasix for today. Also has basal atelectasis, continue incentive spirometry. Patient was started on empiric antibiotics due to her recent hospital stay, pro calcitonin levels are negative and therefore will discontinue antibiotics. D-dimer was also elevated, CT PE scan is negative for pulmonary embolism and shows basal atelectasis.  2.  A. fib with RVR. Started on Cardizem infusion, currently transition back to oral Toprol and Cardizem monitor.  3.  Hypothyroidism. TSH stable, continue Synthroid.  4.  Chronic dysphagia. Patient is on dysphagia type II diet, nectar thick. Last admission decline EGD and further workup. Was also on fluconazole which has been continued.  5.  Chronic kidney disease stage III, renal function stable, monitor.  6.  Anemia of chronic disease. Monitor H&H no active bleeding right now.  Diet: Dysphagia type II diet nectar thick DVT Prophylaxis: subcutaneous  Heparin  Advance goals of care discussion: DNR/DNI  Family Communication: family was present at bedside, at the time of interview. The pt provided permission to discuss medical plan with the family. Opportunity was given to ask question and all questions were answered satisfactorily.   Disposition:  Discharge to most likely back to SNF, family wants PT OT re-eval.  Consultants: none Procedures: none  Antibiotics: Anti-infectives (From admission, onward)   Start     Dose/Rate Route Frequency Ordered Stop   01/03/17 0630  vancomycin (VANCOCIN) 500 mg in sodium chloride 0.9 % 100 mL IVPB  Status:  Discontinued     500 mg 100 mL/hr over 60 Minutes Intravenous Every 24 hours 01/02/17 0619 01/02/17 1252   01/02/17 1000  fluconazole (DIFLUCAN) tablet 100 mg     100 mg Oral Daily 23-Jul-2016 2122     01/02/17 0630  vancomycin (VANCOCIN) IVPB 1000 mg/200 mL premix     1,000 mg 200 mL/hr over 60 Minutes Intravenous  Once 01/02/17 0619 01/02/17 0937   01/02/17 0630  piperacillin-tazobactam (ZOSYN) IVPB 3.375 g  Status:  Discontinued     3.375 g 12.5 mL/hr over 240 Minutes Intravenous Every 8 hours 01/02/17 0619 01/02/17 1252       Objective: Physical Exam: Vitals:   01/02/17 0800 01/02/17 1200 01/02/17 1437 01/02/17 1600  BP: (!) 118/59 102/65  100/65  Pulse: 66 (!) 25  87  Resp: 20 14  (!) 25  Temp: 97.8 F (36.6 C) 97.8 F (36.6 C)  97.8 F (36.6 C)  TempSrc:      SpO2: 96% (!) 89% 96% 99%  Weight:  Height:        Intake/Output Summary (Last 24 hours) at 01/02/2017 1852 Last data filed at 01/02/2017 1800 Gross per 24 hour  Intake 1001.5 ml  Output 150 ml  Net 851.5 ml   Filed Weights   01/03/2017 2332 01/02/17 0137  Weight: 59.7 kg (131 lb 9.8 oz) 59.7 kg (131 lb 9.8 oz)   General: Alert, Awake and Oriented to Time, Place and Person. Appear in moderate distress, affect appropriate Eyes: PERRL, Conjunctiva normal ENT: Oral Mucosa clear moist. Neck: no JVD, no Abnormal  Mass Or lumps Cardiovascular: S1 and S2 Present, aortic systolic Murmur, Peripheral Pulses Present Respiratory: normal respiratory effort, Bilateral Air entry equal and Decreased, no use of accessory muscle, basal Crackles, no wheezes Abdomen: Bowel Sound present, Soft and no tenderness, no hernia Skin: no redness, no Rash, ono induration Extremities: no Pedal edema, no calf tenderness Neurologic: Grossly no focal neuro deficit. Bilaterally Equal motor strength  Data Reviewed: CBC: Recent Labs  Lab 12/30/2016 1833 12/28/2016 2144 01/02/17 0940  WBC 13.4* 10.7* 7.5  NEUTROABS 9.4*  --   --   HGB 10.4* 9.9* 10.8*  HCT 31.8* 30.4* 32.0*  MCV 91.4 90.7 90.4  PLT 350 285 317   Basic Metabolic Panel: Recent Labs  Lab 12/27/16 0453 12/12/2016 1833 01/06/2017 2144 01/02/17 0940  NA  --  133*  --  133*  K  --  4.2  --  4.1  CL  --  100*  --  97*  CO2  --  23  --  24  GLUCOSE  --  124*  --  175*  BUN  --  15  --  14  CREATININE 1.05* 1.15* 1.19* 1.13*  CALCIUM  --  9.2  --  9.0    Liver Function Tests: Recent Labs  Lab 12/13/2016 1833  AST 27  ALT 16  ALKPHOS 68  BILITOT 0.7  PROT 6.5  ALBUMIN 3.0*   No results for input(s): LIPASE, AMYLASE in the last 168 hours. No results for input(s): AMMONIA in the last 168 hours. Coagulation Profile: No results for input(s): INR, PROTIME in the last 168 hours. Cardiac Enzymes: Recent Labs  Lab 12/24/2016 2144 01/02/17 0220 01/02/17 0940  TROPONINI <0.03 <0.03 <0.03   BNP (last 3 results) No results for input(s): PROBNP in the last 8760 hours. CBG: No results for input(s): GLUCAP in the last 168 hours. Studies: Ct Angio Chest Pe W Or Wo Contrast  Result Date: 01/02/2017 CLINICAL DATA:  Shortness of breath.  Elevated D-dimer. EXAM: CT ANGIOGRAPHY CHEST WITH CONTRAST TECHNIQUE: Multidetector CT imaging of the chest was performed using the standard protocol during bolus administration of intravenous contrast. Multiplanar CT image  reconstructions and MIPs were obtained to evaluate the vascular anatomy. CONTRAST:  60 cc Isovue 370 COMPARISON:  Chest x-rays dated 01/02/2017, 12/24/2016, 12/20/2016 and 04/10/2016 and chest CT dated 12/23/2008 FINDINGS: Cardiovascular: Satisfactory opacification of the pulmonary arteries to the segmental level. No evidence of pulmonary embolism. Slight cardiomegaly. Prominent left atrium. No pericardial effusion. Aortic atherosclerosis. Coronary artery atherosclerosis. AICD in place. Mediastinum/Nodes: No adenopathy.  Large hiatal hernia. Lungs/Pleura: Chronic elevation of the right hemidiaphragm. Small left pleural effusion with slight compressive atelectasis at the left lung base. Moderate right effusion with compressive atelectasis in the right lung base. Upper Abdomen: Chronic cholelithiasis. Diverticulosis of the hepatic flexure of the colon. Musculoskeletal: No acute abnormalities. 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 Review of the MIP images confirms the above findings. IMPRESSION: 1. Slight cardiomegaly with dilated left atrium. 2. No pulmonary emboli. 3. Moderate right and small left pleural effusions with bibasilar compressive atelectasis. 4. Aortic atherosclerosis. 5. Coronary artery atherosclerosis. 6. Chronic cholelithiasis. 7. Large hiatal hernia, increased since 2010. Electronically Signed   By: Francene Boyers M.D.   On: 01/02/2017 09:20   Dg Chest Port 1 View  Result Date: 01/02/2017 CLINICAL DATA:  Acute onset of shortness of breath. EXAM: PORTABLE CHEST 1 VIEW COMPARISON:  Chest radiograph performed 23-Jan-2017 FINDINGS: There is persistent elevation of the right hemidiaphragm. Bibasilar and left apical airspace opacities raise concern for pneumonia, more prominent than on the prior study. Small bilateral pleural effusions are suspected. No pneumothorax is seen. The  cardiomediastinal silhouette is mildly enlarged. A pacemaker/AICD is noted overlying the left chest wall, with leads ending overlying the right atrium, right ventricle and coronary sinus. No acute osseous abnormalities are seen. External pacing pads are noted. IMPRESSION: 1. Persistent elevation of the right hemidiaphragm. Bibasilar and left apical airspace opacities raise concern for pneumonia, more prominent than on the prior study. Suspect small bilateral pleural effusions. 2. Mild cardiomegaly. Electronically Signed   By: Roanna Raider M.D.   On: 01/02/2017 06:59   Dg Chest Port 1 View  Result Date: 2017/01/23 CLINICAL DATA:  83 year old female with shortness of breath. EXAM: PORTABLE CHEST 1 VIEW COMPARISON:  Chest radiograph dated 12/24/2016 FINDINGS: There is shallow inspiration. Mild eventration of the right hemidiaphragm similar to prior radiograph. Bibasilar atelectatic changes. No focal consolidation, pleural effusion, or pneumothorax. Overall the appearance of the lungs is similar to prior radiograph. Stable cardiomegaly. Left pectoral AICD device. Atherosclerotic calcification of the aortic arch. No acute osseous pathology. IMPRESSION: Shallow inspiration with bibasilar atelectasis. No focal consolidation. Electronically Signed   By: Elgie Collard M.D.   On: 2017-01-23 19:04    Scheduled Meds: . aspirin EC  81 mg Oral Daily  . budesonide (PULMICORT) nebulizer solution  0.25 mg Nebulization BID  . diltiazem  120 mg Oral Daily  . enoxaparin (LOVENOX) injection  40 mg Subcutaneous Q24H  . famotidine  20 mg Oral BID  . fluconazole  100 mg Oral Daily  . [START ON 01/03/2017] furosemide  20 mg Oral Daily  . iopamidol      . lactose free nutrition  237 mL Oral BID BM  . levalbuterol  0.63 mg Nebulization TID  . levothyroxine  100 mcg Oral QAC breakfast  . lidocaine  2 patch Transdermal Q24H  . metoprolol succinate  75 mg Oral BID   Continuous Infusions: PRN Meds: acetaminophen **OR**  acetaminophen, nitroGLYCERIN, ondansetron **OR** ondansetron (ZOFRAN) IV, oxyCODONE  Time spent: The patient is critically ill with multiple organ systems failure and requires high complexity decision making for assessment and support, frequent evaluation and titration of therapies. Critical Care Time devoted to patient care services described in this note is 35 minutes   Author: Lynden Oxford, MD [facility] Pager: [phone] 01/02/2017 6:52 PM  If 7PM-7AM, please contact night-coverage at [email or web address], password [facility]

## 2017-01-02 NOTE — Progress Notes (Addendum)
Initial Nutrition Assessment  DOCUMENTATION CODES:   Not applicable  INTERVENTION:   Discontinue Ensure BID  Provide Boost Plus po BID (nectar thick consistency), each supplement provides  360 kcal and 16 grams of protein  Magic cup BID with meals, each supplement provides 290 kcal and 9 grams of protein  NUTRITION DIAGNOSIS:   Inadequate oral intake related to dysphagia, other (see comment)(taste changes (too sweet/salty)) as evidenced by per patient/family report.  GOAL:   Patient will meet greater than or equal to 90% of their needs  MONITOR:   PO intake, Supplement acceptance, Weight trends, I & O's  REASON FOR ASSESSMENT:   Malnutrition Screening Tool    ASSESSMENT:   Pt with PMH of CAD, CHF, recently diagnosed A. fib, HTN, HLD, GERD, sleep apnea, and hypothyroidism presents from SNF with acute respiratory failure with hypoxia   Pt with esophageal stricture impacting swallowing. Pt downgraded to Dysphagia 2 diet with nectar liquids today at 1249. Pt reports her doctor is restricting her consumption of bread and meats.   Pt reports fair intake at baseline, however food/beverages have been tasting sweeter/saltier than normal.  Pt's family member reports pt consumes 2 light meals and a snack per day. RD emphasized importance of adequate protein consumption.  Pt's breakfast tray at bedside during visit. Pt had consumed a majority of eggs, yogurt and almost all of oatmeal.   Pt's weight stable and trending upwards over past two months. Depletions likely due to age-related sarcopenia not malnutrition.   Pt not consuming Ensure as they are "too sweet", reports she will consume Boost supplements. Pt also willing to try Magic Cup supplements on lunch and dinner trays.   Labs reviewed; Na 133, Albumin 3.0 Medications reviewed; Pepcid   NUTRITION - FOCUSED PHYSICAL EXAM:    Most Recent Value  Orbital Region  No depletion  Upper Arm Region  Moderate depletion  Thoracic  and Lumbar Region  No depletion  Buccal Region  No depletion  Temple Region  Mild depletion  Clavicle Bone Region  Mild depletion  Clavicle and Acromion Bone Region  Mild depletion  Scapular Bone Region  No depletion  Dorsal Hand  No depletion  Patellar Region  No depletion  Anterior Thigh Region  No depletion  Posterior Calf Region  No depletion      Diet Order:  DIET DYS 2 Room service appropriate? Yes; Fluid consistency: Nectar Thick  EDUCATION NEEDS:   No education needs have been identified at this time  Skin:  Skin Assessment: Reviewed RN Assessment  Last BM:  12/27/16  Height:   Ht Readings from Last 1 Encounters:  01/02/17 4\' 11"  (1.499 m)    Weight:   Wt Readings from Last 1 Encounters:  01/02/17 131 lb 9.8 oz (59.7 kg)    Ideal Body Weight:  42.9 kg  BMI:  Body mass index is 26.58 kg/m.  Estimated Nutritional Needs:   Kcal:  1300-1500  Protein:  65-75 grams  Fluid:  >/= 1.3 L/d  Fransisca KaufmannAllison Ioannides, MS, RDN, LDN 01/02/2017 3:07 PM

## 2017-01-03 LAB — CBC
HCT: 29.2 % — ABNORMAL LOW (ref 36.0–46.0)
Hemoglobin: 9.6 g/dL — ABNORMAL LOW (ref 12.0–15.0)
MCH: 30 pg (ref 26.0–34.0)
MCHC: 32.9 g/dL (ref 30.0–36.0)
MCV: 91.3 fL (ref 78.0–100.0)
PLATELETS: 332 10*3/uL (ref 150–400)
RBC: 3.2 MIL/uL — ABNORMAL LOW (ref 3.87–5.11)
RDW: 13.9 % (ref 11.5–15.5)
WBC: 18.3 10*3/uL — AB (ref 4.0–10.5)

## 2017-01-03 LAB — BASIC METABOLIC PANEL
Anion gap: 10 (ref 5–15)
BUN: 28 mg/dL — AB (ref 6–20)
CALCIUM: 9.4 mg/dL (ref 8.9–10.3)
CO2: 25 mmol/L (ref 22–32)
CREATININE: 1.36 mg/dL — AB (ref 0.44–1.00)
Chloride: 98 mmol/L — ABNORMAL LOW (ref 101–111)
GFR calc Af Amer: 38 mL/min — ABNORMAL LOW (ref >60)
GFR, EST NON AFRICAN AMERICAN: 32 mL/min — AB (ref >60)
GLUCOSE: 184 mg/dL — AB (ref 65–99)
POTASSIUM: 4.9 mmol/L (ref 3.5–5.1)
SODIUM: 133 mmol/L — AB (ref 135–145)

## 2017-01-03 LAB — MAGNESIUM: MAGNESIUM: 2.6 mg/dL — AB (ref 1.7–2.4)

## 2017-01-03 MED ORDER — LEVALBUTEROL HCL 0.63 MG/3ML IN NEBU
0.6300 mg | INHALATION_SOLUTION | Freq: Four times a day (QID) | RESPIRATORY_TRACT | Status: DC | PRN
  Administered 2017-01-04: 0.63 mg via RESPIRATORY_TRACT
  Filled 2017-01-03 (×2): qty 3

## 2017-01-03 MED ORDER — WHITE PETROLATUM EX OINT
TOPICAL_OINTMENT | CUTANEOUS | Status: AC
  Administered 2017-01-03: 1
  Filled 2017-01-03: qty 28.35

## 2017-01-03 MED ORDER — RESOURCE THICKENUP CLEAR PO POWD
ORAL | Status: DC | PRN
  Filled 2017-01-03: qty 125

## 2017-01-03 MED ORDER — LORAZEPAM 2 MG/ML IJ SOLN
0.2500 mg | Freq: Once | INTRAMUSCULAR | Status: AC
  Administered 2017-01-03: 0.25 mg via INTRAVENOUS
  Filled 2017-01-03: qty 1

## 2017-01-03 MED ORDER — FUROSEMIDE 20 MG PO TABS
20.0000 mg | ORAL_TABLET | Freq: Every evening | ORAL | Status: AC
  Administered 2017-01-03: 20 mg via ORAL
  Filled 2017-01-03: qty 1

## 2017-01-03 NOTE — Progress Notes (Signed)
Occupational Therapy Progress Note  Pt seen a second time by OT.  She performed toilet transfers and toileting as well as grooming standing at sink with min guard assistance.  She ambulated in hallway ~100' with min A to maneuver RW as she fatigued.  Family is very supportive.  Continue to recommend post acute rehab - family prefers CIR if she qualifies.  Will continue to follow.    Discharge recommendations:   CIR vs SNF   01/03/17 1500  OT Visit Information  Last OT Received On 01/03/17  Assistance Needed +1  History of Present Illness This [age] [facility] with SOB.  Dx:  Acute respiratory failure with hypoxia, cause not clear.  EKG was done and she  was found to be in A-FIb with RVR, CHF.  PMH includes:  Dysphagia (D-2 diet),  anxiety, h/o falls, LBBB, pacemaker, esophageal stricture.   Precautions  Precautions Fall  Precaution Comments h/o falls   Pain Assessment  Pain Assessment No/denies pain  Cognition  Arousal/Alertness Awake/alert  Behavior During Therapy WFL for tasks assessed/performed  Overall Cognitive Status Impaired/Different from baseline  Area of Impairment Attention;Memory;Following commands;Safety/judgement;Awareness;Problem solving  Current Attention Level Selective  Memory Decreased short-term memory  Following Commands Follows one step commands consistently;Follows multi-step commands inconsistently  Safety/Judgement Decreased awareness of safety;Decreased awareness of deficits  Problem Solving Difficulty sequencing;Requires verbal cues;Requires tactile cues  General Comments Pt requires cues to redirect to task.  Pt confabulates at times   ADL  Overall ADL's  Needs assistance/impaired  Grooming Wash/dry hands;Min Warden/rangerguard;Standing  Toilet Transfer Min guard;Ambulation;Comfort height toilet;RW;Grab bars  Toilet Transfer Details (indicate cue type and reason) Pt chose to back into bathroom.  cautioned her against  this at home   Toileting- IT trainerClothing Manipulation and Hygiene Min guard;Sit to/from stand  Functional mobility during ADLs Min guard;Rolling walker  Bed Mobility  Overal bed mobility Needs Assistance  Bed Mobility Sit to Supine;Supine to Sit  Supine to sit Supervision  Sit to supine Supervision  Balance  Overall balance assessment History of Falls;Needs assistance  Sitting-balance support Feet supported  Sitting balance-Leahy Scale Good  Standing balance support Bilateral upper extremity supported  Standing balance-Leahy Scale Poor  Transfers  Overall transfer level Needs assistance  Equipment used Rolling walker (2 wheeled)  Transfers Sit to/from Stand;Stand Pivot Transfers  Sit to Stand Min guard  Stand pivot transfers Min guard  General transfer comment min guard for safety   OT - End of Session  Equipment Utilized During Treatment Rolling walker  Activity Tolerance Patient tolerated treatment well  Patient left in bed;with call bell/phone within reach;with bed alarm set;with family/visitor present  Nurse Communication Mobility status  OT Assessment/Plan  OT Plan Discharge plan remains appropriate  OT Visit Diagnosis Unsteadiness on feet (R26.81)  OT Frequency (ACUTE ONLY) Min 2X/week  Recommendations for Other Services Rehab consult  Follow Up Recommendations CIR;SNF;Supervision/Assistance - 24 hour  OT Equipment None recommended by OT  AM-PAC OT "6 Clicks" Daily Activity Outcome Measure  Help from another person eating meals? 4  Help from another person taking care of personal grooming? 3  Help from another person toileting, which includes using toliet, bedpan, or urinal? 3  Help from another person bathing (including washing, rinsing, drying)? 3  Help from another person to put on and taking off regular upper body clothing? 3  Help from another person to put on and taking off regular lower body clothing? 3  6 Click Score 19  ADL G Code Conversion CK  OT Goal Progression   Progress towards OT goals Progressing toward goals  OT Time Calculation  OT Start Time (ACUTE ONLY) 1431  OT Stop Time (ACUTE ONLY) 1501  OT Time Calculation (min) 30 min  OT General Charges  $OT Visit 1 Visit  OT Treatments  $Self Care/Home Management  8-22 mins  $Therapeutic Activity 8-22 mins   Reynolds AmericanWendi Takia Runyon, OTR/L [phone]

## 2017-01-03 NOTE — Progress Notes (Signed)
Pt transferred to 3E04 with all of her belongings. Pt family informed that pt has been transferred.

## 2017-01-03 NOTE — Evaluation (Signed)
Physical Therapy Evaluation Patient Details Name: Tracy MontanaDorothy G [name] MRN: [id number] DOB: [birth year] Today's Date: 01/03/2017   History of Present Illness  This [id number] [facility] with SOB.  Dx:  Acute respiratory failure with hypoxia, cause not clear.  EKG was done and she  was found to be in A-FIb with RVR, CHF.  PMH includes:  Dysphagia (D-2 diet),  anxiety, h/o falls, LBBB, pacemaker, esophageal stricture.   Clinical Impression  Pt admitted with/for acute respiratory failure with hypoxia.  Pt presently at min guard level with occasional min assist to stand up when tired..  Pt currently limited functionally due to the problems listed. ( See problems list.)   Pt will benefit from PT to maximize function and safety in order to get ready for next venue listed below.     Follow Up Recommendations Other (comment)(post acute rehab.  pt's family really want CIR, will accept going to SNF)    Equipment Recommendations  None recommended by PT    Recommendations for Other Services       Precautions / Restrictions Precautions Precautions: Fall Precaution Comments: h/o falls       Mobility  Bed Mobility Overal bed mobility: Needs Assistance Bed Mobility: Sit to Supine     Supine to sit: Supervision        Transfers Overall transfer level: Needs assistance Equipment used: Rolling walker (2 wheeled) Transfers: Sit to/from [employer] CorporationStand;Stand Pivot Transfers Sit to Stand: Min guard Stand pivot transfers: Min guard       General transfer comment: min guard for safety   Ambulation/Gait Ambulation/Gait assistance: Min guard Ambulation Distance (Feet): 80 Feet Assistive device: Rolling walker (2 wheeled) Gait Pattern/deviations: Step-through pattern;Trunk flexed   Gait velocity interpretation: Below normal speed for age/gender General Gait Details: pt's gait generally steady, but tentative, flexed posture, short steps, behind the RW too  much.  No overt deviations or LOB.  Sats maintained mid 90's, HR stable.  Stairs            Wheelchair Mobility    Modified Rankin (Stroke Patients Only)       Balance Overall balance assessment: History of Falls;Needs assistance Sitting-balance support: Feet supported Sitting balance-Leahy Scale: Good(to fair) Sitting balance - Comments: MMT over challenged her balance causing her to list posteriorly   Standing balance support: Bilateral upper extremity supported Standing balance-Leahy Scale: Poor                               Pertinent Vitals/Pain Pain Assessment: No/denies pain    Home Living Family/patient expects to be discharged to:: Unsure Living Arrangements: Alone Available Help at Discharge: Family;Available PRN/intermittently Type of Home: House       Home Layout: Two level;Other (Comment)(has elevator) Home Equipment: Shower seat;Walker - 2 wheels;Walker - 4 wheels Additional Comments: Pt has elevator.  Grand daughter in law stays with pt during day 7 days/week.  Family has considered adding caregiver for pm.       Prior Function Level of Independence: Needs assistance   Gait / Transfers Assistance Needed: Pt was ambulating with RW mod I until the past week or so   ADL's / Homemaking Assistance Needed: grand daughter in law reports a progressive decline in function over the past ~6 mos in which pt has required increased assistince with IADLs, as well as intermitent assist with ADLs   Comments: daughter in law  comes daily and helps as needed.      Hand Dominance   Dominant Hand: Right    Extremity/Trunk Assessment   Upper Extremity Assessment Upper Extremity Assessment: Generalized weakness    Lower Extremity Assessment Lower Extremity Assessment: Overall WFL for tasks assessed;Generalized weakness(weakness more proximal, hams/hip flexors and core)    Cervical / Trunk Assessment Cervical / Trunk Assessment: Kyphotic   Communication   Communication: HOH  Cognition Arousal/Alertness: Awake/alert Behavior During Therapy: WFL for tasks assessed/performed Overall Cognitive Status: Impaired/Different from baseline Area of Impairment: Attention;Memory;Following commands;Safety/judgement;Awareness;Problem solving                   Current Attention Level: Selective Memory: Decreased short-term memory Following Commands: Follows one step commands consistently;Follows multi-step commands inconsistently Safety/Judgement: Decreased awareness of safety;Decreased awareness of deficits   Problem Solving: Difficulty sequencing;Requires verbal cues;Requires tactile cues General Comments: Pt provided inaccurate information re: PLOF multiple times.  Self distracts requiring cues to redirect to task       General Comments General comments (skin integrity, edema, etc.): vss    Exercises     Assessment/Plan    PT Assessment Patient needs continued PT services  PT Problem List Decreased strength;Decreased activity tolerance;Decreased mobility;Decreased knowledge of use of DME;Decreased balance       PT Treatment Interventions DME instruction;Gait training;Stair training;Functional mobility training;Therapeutic activities;Balance training;Patient/family education    PT Goals (Current goals can be found in the Care Plan section)  Acute Rehab PT Goals Patient Stated Goal: to go home  PT Goal Formulation: With patient Time For Goal Achievement: 01/17/17 Potential to Achieve Goals: Good    Frequency Min 3X/week   Barriers to discharge        Co-evaluation PT/OT/SLP Co-Evaluation/Treatment: Yes Reason for Co-Treatment: For patient/therapist safety;To address functional/ADL transfers PT goals addressed during session: Mobility/safety with mobility OT goals addressed during session: ADL's and self-care       AM-PAC PT "6 Clicks" Daily Activity  Outcome Measure Difficulty turning over in bed  (including adjusting bedclothes, sheets and blankets)?: A Little Difficulty moving from lying on back to sitting on the side of the bed? : A Little Difficulty sitting down on and standing up from a chair with arms (e.g., wheelchair, bedside commode, etc,.)?: None Help needed moving to and from a bed to chair (including a wheelchair)?: A Little Help needed walking in hospital room?: A Little Help needed climbing 3-5 steps with a railing? : A Lot 6 Click Score: 18    End of Session   Activity Tolerance: Patient tolerated treatment well Patient left: with call bell/phone within reach;with family/visitor present;in chair;with chair alarm set Nurse Communication: Mobility status PT Visit Diagnosis: Unsteadiness on feet (R26.81);Other abnormalities of gait and mobility (R26.89)    Time: [id number] PT Time Calculation (min) (ACUTE ONLY): 30 min   Charges:   PT Evaluation $PT Eval Moderate Complexity: 1 Mod     PT G Codes:        01/03/2017  [place] BingKen April Colter, PT [phone]  (pager)  Eliseo GumKenneth V Kaja Jackowski 01/03/2017, 2:14 PM

## 2017-01-03 NOTE — Progress Notes (Signed)
[facility] Progress Note  Patient: [name] IEP:[id number]   PCP: Helane RimaWallace, Erica, DO DOB: 06-27-1921   DOA: 2016-05-11   DOS: 01/03/2017   Date of Service: the patient was seen and examined on 01/03/2017  Subjective: Shortness of breath is still present, cough is still present.  Oral diet is improving.  No nausea no vomiting.  Brief hospital course: Pt. with PMH of CAD S/P PCI, chronic systolic CHF, a flutter not on any anticoagulation due to fall risk, anemia, CKD, chronic dysphagia; admitted on 2016-05-11, presented with complaint of shortness of breath, was found to have acute on chronic systolic CHF due to a flutter with RVR. Currently further plan is continue Lasix and monitor.  Assessment and Plan: 1.  Acute hypoxic respiratory failure. Acute on chronic systolic CHF. A. fib with RVR. Most likely patient went into A. fib with RVR that led to CHF exacerbation. Received IV Lasix with significant improvement in her respiration right now. Continue increased dose of Lasix today Also has basal atelectasis, continue incentive spirometry. Patient was started on empiric antibiotics due to her recent hospital stay, pro calcitonin levels are negative and therefore will discontinue antibiotics. D-dimer was also elevated, CT PE scan is negative for pulmonary embolism and shows basal atelectasis.  2.  A. fib with RVR. Started on Cardizem infusion, currently transition back to oral Toprol and Cardizem monitor.  3.  Hypothyroidism. TSH stable, continue Synthroid.  4.  Chronic dysphagia. Patient is on dysphagia type II diet, nectar thick. Last admission decline EGD and further workup. Was also on fluconazole which has been continued.  5.  Chronic kidney disease stage III, renal function stable, monitor.  6.  Anemia of chronic disease. Monitor H&H no active bleeding right now.  Diet: Dysphagia type II diet nectar thick DVT Prophylaxis: subcutaneous Heparin  Advance goals  of care discussion: DNR/DNI  Family Communication: no family was present at bedside, at the time of interview.  Discussed with family on 01/02/2017  Disposition:  Discharge to most likely back to SNF, family wants CIR.  Consultants: none Procedures: none  Antibiotics: Anti-infectives (From admission, onward)   Start     Dose/Rate Route Frequency Ordered Stop   01/03/17 0630  vancomycin (VANCOCIN) 500 mg in sodium chloride 0.9 % 100 mL IVPB  Status:  Discontinued     500 mg 100 mL/hr over 60 Minutes Intravenous Every 24 hours 01/02/17 0619 01/02/17 1252   01/02/17 1000  fluconazole (DIFLUCAN) tablet 100 mg     100 mg Oral Daily 04/07/2016 2122     01/02/17 0630  vancomycin (VANCOCIN) IVPB 1000 mg/200 mL premix     1,000 mg 200 mL/hr over 60 Minutes Intravenous  Once 01/02/17 0619 01/02/17 0937   01/02/17 0630  piperacillin-tazobactam (ZOSYN) IVPB 3.375 g  Status:  Discontinued     3.375 g 12.5 mL/hr over 240 Minutes Intravenous Every 8 hours 01/02/17 0619 01/02/17 1252       Objective: Physical Exam: Vitals:   01/03/17 0728 01/03/17 0729 01/03/17 0943 01/03/17 1231  BP:    108/67  Pulse:   84 73  Resp:   20 15  Temp:    97.9 F (36.6 C)  TempSrc:    Oral  SpO2: 95% 96% 96% 96%  Weight:      Height:        Intake/Output Summary (Last 24 hours) at 01/03/2017 1613 Last data filed at 01/02/2017 2307 Gross per 24 hour  Intake 360 ml  Output -  Net 360 ml   Filed Weights   2016-05-19 2332 01/02/17 0137  Weight: 59.7 kg (131 lb 9.8 oz) 59.7 kg (131 lb 9.8 oz)   General: Alert, Awake and Oriented to Time, Place and Person. Appear in moderate distress, affect appropriate Eyes: PERRL, Conjunctiva normal ENT: Oral Mucosa clear moist. Neck: no JVD, no Abnormal Mass Or lumps Cardiovascular: S1 and S2 Present, aortic systolic Murmur, Peripheral Pulses Present Respiratory: normal respiratory effort, Bilateral Air entry equal and Decreased, no use of accessory muscle, basal  Crackles, no wheezes Abdomen: Bowel Sound present, Soft and no tenderness, no hernia Skin: no redness, no Rash, ono induration Extremities: no Pedal edema, no calf tenderness Neurologic: Grossly no focal neuro deficit. Bilaterally Equal motor strength  Data Reviewed: CBC: Recent Labs  Lab 2016-05-19 1833 2016-05-19 2144 01/02/17 0940 01/03/17 1054  WBC 13.4* 10.7* 7.5 18.3*  NEUTROABS 9.4*  --   --   --   HGB 10.4* 9.9* 10.8* 9.6*  HCT 31.8* 30.4* 32.0* 29.2*  MCV 91.4 90.7 90.4 91.3  PLT 350 285 317 332   Basic Metabolic Panel: Recent Labs  Lab 2016-05-19 1833 2016-05-19 2144 01/02/17 0940 01/03/17 1054  NA 133*  --  133* 133*  K 4.2  --  4.1 4.9  CL 100*  --  97* 98*  CO2 23  --  24 25  GLUCOSE 124*  --  175* 184*  BUN 15  --  14 28*  CREATININE 1.15* 1.19* 1.13* 1.36*  CALCIUM 9.2  --  9.0 9.4  MG  --   --   --  2.6*    Liver Function Tests: Recent Labs  Lab 2016-05-19 1833  AST 27  ALT 16  ALKPHOS 68  BILITOT 0.7  PROT 6.5  ALBUMIN 3.0*   No results for input(s): LIPASE, AMYLASE in the last 168 hours. No results for input(s): AMMONIA in the last 168 hours. Coagulation Profile: No results for input(s): INR, PROTIME in the last 168 hours. Cardiac Enzymes: Recent Labs  Lab 2016-05-19 2144 01/02/17 0220 01/02/17 0940  TROPONINI <0.03 <0.03 <0.03   BNP (last 3 results) No results for input(s): PROBNP in the last 8760 hours. CBG: No results for input(s): GLUCAP in the last 168 hours. Studies: No results found.  Scheduled Meds: . aspirin EC  81 mg Oral Daily  . budesonide (PULMICORT) nebulizer solution  0.25 mg Nebulization BID  . diltiazem  120 mg Oral Daily  . enoxaparin (LOVENOX) injection  40 mg Subcutaneous Q24H  . famotidine  20 mg Oral BID  . fluconazole  100 mg Oral Daily  . furosemide  20 mg Oral Daily  . furosemide  20 mg Oral QPM  . lactose free nutrition  237 mL Oral BID BM  . levothyroxine  100 mcg Oral QAC breakfast  . lidocaine  2 patch  Transdermal Q24H  . metoprolol succinate  75 mg Oral BID   Continuous Infusions: PRN Meds: acetaminophen **OR** acetaminophen, levalbuterol, nitroGLYCERIN, ondansetron **OR** ondansetron (ZOFRAN) IV, oxyCODONE  Time spent: 35 minute Author: Lynden OxfordPranav Moe Brier, MD [facility] Pager: [phone] 01/03/2017 4:13 PM  If 7PM-7AM, please contact night-coverage at [email or web address], password [email or web address]

## 2017-01-03 NOTE — Plan of Care (Signed)
Continue current care plan 

## 2017-01-03 NOTE — Evaluation (Signed)
Occupational Therapy Evaluation Patient Details Name: [name] MRN: [id number] DOB: Jun 07, 1921 Today's Date: 01/03/2017    History of Present Illness This [id number] female [facility] for [facility] days), with SOB.  Dx:  Acute respiratory failure with hypoxia, cause not clear.  EKG was done and she  was found to be in A-FIb with RVR, CHF.  PMH includes:  Dysphagia (D-2 diet),  anxiety, h/o falls, LBBB, pacemaker, esophageal stricture.    Clinical Impression   Pt admitted with above. She demonstrates the below listed deficits and will benefit from continued OT to maximize safety and independence with BADLs.  Pt presents to OT with impaired cognition, decreased balance with h/o falls, generalized weakness, and decreased activity tolerance.  She currently requires min A for ADLs and min guard assist for functional mobility.  PTA, [facility], and prior to that, she was living alone with assist during the day from great grand daughter in law.   SHe was mod I with ambulation with RW, and variable amount of assist with ADLs.   Grand daughter in law states today's performance was the best she has seen pt do in several weeks > month.  Feel pt would benefit from post acute rehab - family really prefers CIR, although she may be functioning at too a high a level for CIR - will contact admissions coordinator to screen for CIR.  Will follow acutely.        Follow Up Recommendations  CIR;SNF;Supervision/Assistance - 24 hour    Equipment Recommendations  None recommended by OT    Recommendations for Other Services Rehab consult     Precautions / Restrictions Precautions Precautions: Fall Precaution Comments: h/o falls       Mobility Bed Mobility Overal bed mobility: Needs Assistance Bed Mobility: Sit to Supine     Supine to sit: Supervision        Transfers Overall transfer level: Needs assistance Equipment used: Rolling walker (2  wheeled) Transfers: Sit to/from [employer] CorporationStand;Stand Pivot Transfers Sit to Stand: Min guard Stand pivot transfers: Min guard       General transfer comment: min guard for safety     Balance Overall balance assessment: History of Falls;Needs assistance Sitting-balance support: Feet supported Sitting balance-Leahy Scale: Good     Standing balance support: Bilateral upper extremity supported Standing balance-Leahy Scale: Poor                             ADL either performed or assessed with clinical judgement   ADL Overall ADL's : Needs assistance/impaired Eating/Feeding: Set up;Sitting   Grooming: Wash/dry hands;Wash/dry face;Oral care;Brushing hair;Min guard;Standing   Upper Body Bathing: Set up;Sitting   Lower Body Bathing: Minimal assistance;Sit to/from stand   Upper Body Dressing : Set up;Supervision/safety;Sitting   Lower Body Dressing: Minimal assistance;Sit to/from stand   Toilet Transfer: Min guard;Ambulation;Comfort height toilet;RW;Grab bars   Toileting- Clothing Manipulation and Hygiene: Min guard;Sit to/from stand       Functional mobility during ADLs: Min guard;Rolling walker General ADL Comments: Pt requires assist to steady      Vision Patient Visual Report: No change from baseline       Perception     Praxis      Pertinent Vitals/Pain Pain Assessment: No/denies pain     Hand Dominance Right   Extremity/Trunk Assessment Upper Extremity Assessment Upper Extremity Assessment: Generalized weakness   Lower  Extremity Assessment Lower Extremity Assessment: Defer to PT evaluation   Cervical / Trunk Assessment Cervical / Trunk Assessment: Kyphotic   Communication Communication Communication: HOH   Cognition Arousal/Alertness: Awake/alert Behavior During Therapy: WFL for tasks assessed/performed Overall Cognitive Status: Impaired/Different from baseline Area of Impairment: Attention;Memory;Following  commands;Safety/judgement;Awareness;Problem solving                   Current Attention Level: Selective Memory: Decreased short-term memory Following Commands: Follows one step commands consistently;Follows multi-step commands inconsistently Safety/Judgement: Decreased awareness of safety;Decreased awareness of deficits   Problem Solving: Difficulty sequencing;Requires verbal cues;Requires tactile cues General Comments: Pt provided inaccurate information re: PLOF multiple times.  Self distracts requiring cues to redirect to task    General Comments  VSS    Exercises     Shoulder Instructions      Home Living Family/patient expects to be discharged to:: Unsure Living Arrangements: Alone Available Help at Discharge: Family;Available PRN/intermittently Type of Home: House       Home Layout: Two level;Other (Comment)     Bathroom Shower/Tub: Chief Strategy OfficerTub/shower unit   Bathroom Toilet: Handicapped height     Home Equipment: Emergency planning/management officerhower seat;Walker - 2 wheels;Walker - 4 wheels   Additional Comments: Pt has elevator.  Grand daughter in law stays with pt during day 7 days/week.  Family has considered adding caregiver for pm.         Prior Functioning/Environment Level of Independence: Needs assistance  Gait / Transfers Assistance Needed: Pt was ambulating with RW mod I until the past week or so  ADL's / Homemaking Assistance Needed: grand daughter in law reports a progressive decline in function over the past ~6 mos in which pt has required increased assistince with IADLs, as well as intermitent assist with ADLs             OT Problem List: Decreased strength;Decreased activity tolerance;Impaired balance (sitting and/or standing);Decreased cognition;Decreased safety awareness;Decreased knowledge of use of DME or AE      OT Treatment/Interventions: Self-care/ADL training;Therapeutic exercise;DME and/or AE instruction;Therapeutic activities;Cognitive  remediation/compensation;Patient/family education;Balance training    OT Goals(Current goals can be found in the care plan section) Acute Rehab OT Goals Patient Stated Goal: to go home  OT Goal Formulation: With patient/family Time For Goal Achievement: 01/17/17 Potential to Achieve Goals: Good  OT Frequency: Min 2X/week   Barriers to D/C: Decreased caregiver support          Co-evaluation PT/OT/SLP Co-Evaluation/Treatment: Yes Reason for Co-Treatment: For patient/therapist safety   OT goals addressed during session: ADL's and self-care      AM-PAC PT "6 Clicks" Daily Activity     Outcome Measure Help from another person eating meals?: None Help from another person taking care of personal grooming?: A Little Help from another person toileting, which includes using toliet, bedpan, or urinal?: A Little Help from another person bathing (including washing, rinsing, drying)?: A Little Help from another person to put on and taking off regular upper body clothing?: A Little Help from another person to put on and taking off regular lower body clothing?: A Little 6 Click Score: 19   End of Session Equipment Utilized During Treatment: Rolling walker Nurse Communication: Mobility status  Activity Tolerance: Patient tolerated treatment well Patient left: in chair;with call bell/phone within reach;with family/visitor present;with chair alarm set  OT Visit Diagnosis: Unsteadiness on feet (R26.81)                Time: [id number] OT Time Calculation (min): 30 min  Charges:  OT General Charges $OT Visit: 1 Visit OT Evaluation $OT Eval Moderate Complexity: 1 Mod G-Codes:     [employer], OTR/L [phone]   Jeani Hawking M 01/03/2017, 1:33 PM

## 2017-01-04 ENCOUNTER — Telehealth: Payer: Self-pay | Admitting: *Deleted

## 2017-01-04 ENCOUNTER — Encounter: Payer: Medicare Other | Admitting: *Deleted

## 2017-01-04 ENCOUNTER — Inpatient Hospital Stay (HOSPITAL_COMMUNITY): Payer: Medicare Other

## 2017-01-04 ENCOUNTER — Telehealth: Payer: Self-pay | Admitting: Cardiology

## 2017-01-04 MED ORDER — IPRATROPIUM-ALBUTEROL 0.5-2.5 (3) MG/3ML IN SOLN: 3.0000 mL | Freq: Four times a day (QID) | RESPIRATORY_TRACT | Status: DC

## 2017-01-04 MED ORDER — FUROSEMIDE 10 MG/ML IJ SOLN
40.0000 mg | Freq: Once | INTRAMUSCULAR | Status: AC
  Administered 2017-01-04: 40 mg via INTRAVENOUS

## 2017-01-04 MED ORDER — GLYCOPYRROLATE 0.2 MG/ML IJ SOLN
0.2000 mg | INTRAMUSCULAR | Status: DC | PRN
  Administered 2017-01-04: 0.2 mg via INTRAVENOUS
  Filled 2017-01-04: qty 1

## 2017-01-04 MED ORDER — SODIUM CHLORIDE 0.9 % IV SOLN
3.0000 mg/h | INTRAVENOUS | Status: DC
  Administered 2017-01-04 (×2): 2 mg/h via INTRAVENOUS
  Filled 2017-01-04: qty 10

## 2017-01-04 MED ORDER — IPRATROPIUM-ALBUTEROL 0.5-2.5 (3) MG/3ML IN SOLN
RESPIRATORY_TRACT | Status: AC
  Administered 2017-01-04: 05:00:00
  Filled 2017-01-04: qty 3

## 2017-01-04 MED ORDER — MORPHINE SULFATE (PF) 2 MG/ML IV SOLN
2.0000 mg | Freq: Once | INTRAVENOUS | Status: AC
  Administered 2017-01-04: 2 mg via INTRAVENOUS

## 2017-01-04 MED ORDER — MORPHINE SULFATE (PF) 2 MG/ML IV SOLN: 2.0000 mg | Freq: Once | INTRAVENOUS | Status: DC

## 2017-01-04 MED ORDER — MORPHINE BOLUS VIA INFUSION
2.0000 mg | INTRAVENOUS | Status: DC | PRN
  Administered 2017-01-04: 2 mg via INTRAVENOUS
  Filled 2017-01-04: qty 2

## 2017-01-04 MED ORDER — MORPHINE SULFATE (PF) 2 MG/ML IV SOLN
INTRAVENOUS | Status: AC
  Filled 2017-01-04: qty 1

## 2017-01-04 MED ORDER — IPRATROPIUM-ALBUTEROL 0.5-2.5 (3) MG/3ML IN SOLN
3.0000 mL | Freq: Four times a day (QID) | RESPIRATORY_TRACT | Status: DC
  Administered 2017-01-04 (×2): 3 mL via RESPIRATORY_TRACT
  Filled 2017-01-04: qty 3

## 2017-01-04 MED ORDER — LORAZEPAM 2 MG/ML IJ SOLN: 0.5000 mg | INTRAMUSCULAR | Status: DC | PRN

## 2017-01-04 MED ORDER — FUROSEMIDE 10 MG/ML IJ SOLN
INTRAMUSCULAR | Status: AC
  Filled 2017-01-04: qty 4

## 2017-01-04 MED ORDER — LORAZEPAM 2 MG/ML IJ SOLN
0.5000 mg | Freq: Once | INTRAMUSCULAR | Status: AC
  Administered 2017-01-04: 0.5 mg via INTRAVENOUS
  Filled 2017-01-04: qty 1

## 2017-01-04 MED ORDER — IPRATROPIUM-ALBUTEROL 0.5-2.5 (3) MG/3ML IN SOLN
3.0000 mL | Freq: Once | RESPIRATORY_TRACT | Status: AC
  Administered 2017-01-04: 3 mL via RESPIRATORY_TRACT

## 2017-01-05 NOTE — Progress Notes (Signed)
On change of shift, pt was in bed with family @ bedside. Remained on comfort care with Morphine gtt. Highly congested with increased secretion. Rubinol given with good effect. Patient's [name] came in and prayed with her & her family. Patient finally passed @ 2115 with family @ bedside. The remaining of Morphine gtt (202.6 LM)  was D/C'd with Marlinda Mikeallas, Consulting civil engineercharge RN. MD and [place] [facility] notified.

## 2017-01-08 NOTE — Progress Notes (Signed)
RT came to do neb tx. RT found pt on 10L HFNC

## 2017-01-08 NOTE — Progress Notes (Addendum)
Called by RNs.  Patient was experiencing respiratory distress.  I asked the RNs to call the provider since I was with another patient with a medical emergency. [facility] and call me with an update.  Per update [facility] NP came to bedside, patient was DNR, transitioned to comfort care, morphine drip was started.   Call Time was 340.

## 2017-01-08 NOTE — Progress Notes (Signed)
Pt family at bedside. Pt pulling off venturi mask. Nasal cannula used but not effective.  Pt began to try to get out of bed pulling off 02.  Ativan given and was not effective.  Rapid called on call at bedside giving orders for nebs and morphine.  Morphine drip ordered and implemented. Pt comfortable

## 2017-01-08 NOTE — Progress Notes (Signed)
Pt is somolent this AM, MD paged concerning schedule oral morning meds, ordered nurse to hold due to pt condition. Comfortable in bed, family at bedside.

## 2017-01-08 NOTE — Clinical Social Work Note (Signed)
CSW acknowledges SNF consult. [facility]. According to documentation, focus now is on comfort care. CSW will continue to follow for discharge needs.  Charlynn CourtSarah Pietra Zuluaga, CSW [phone]

## 2017-01-08 NOTE — Progress Notes (Signed)
Pt is comfortable in bed, comfort care measures in place as order, Morphine bolus given and drip rate changed to 3 ML/hr. Families at bedside.

## 2017-01-08 NOTE — Progress Notes (Signed)
Palliative Medicine RN Note: Consult order noted. Discussed pt with Dr Phillips OdorGolding.  Patient was seen by Dr Phillips OdorGolding [facility] Decision was made overnight for strict comfort care following a rapid decline in status. Pt is currently on morphine gtt at 2mg /hour. She is in bed with daughter and grandchildren at bedside. Other family arrived during my visit. Confirmed that goal is comfort and dignity. They clearly remember Dr Phillips OdorGolding and are grateful that she is again involved in Yvana's care. Nicole CellaDorothy is having some labored breathing despite drip, and some secretions are heard very quietly.   Dr Phillips OdorGolding adjusted comfort medications, increasing basal rate of morphine and adding bolus dose. She also added Ativan and Robinul prn. Telemetry was d/c, and comfort measures were initiated. Discussed changes with RN and family, and I answered their questions. Plan for follow up by PMT member this afternoon.  Margret ChanceMelanie G. Toddy Boyd, RN, BSN, [facility] 11/09/16 12:08 PM Office [phone]

## 2017-01-08 NOTE — Telephone Encounter (Signed)
LMOVM reminding pt to send remote transmission.   

## 2017-01-08 NOTE — Progress Notes (Signed)
Physical medicine rehabilitation consult requested chart reviewed. Patient is currently not able to participate with therapies and family has requested comfort care.

## 2017-01-08 NOTE — Progress Notes (Signed)
[facility] Progress Note  Patient: [name] [id number]   PCP: Helane RimaWallace, Erica, DO DOB: Aug 20, 1921   DOA: 12/23/2016   DOS: Jul 08, 2016   Date of Service: the patient was seen and examined on Jul 08, 2016  Subjective: Shortness of breath is still present, cough is still present.  Oral diet is improving.  No nausea no vomiting.  Brief hospital course: Pt. with PMH of CAD S/P PCI, chronic systolic CHF, a flutter not on any anticoagulation due to fall risk, anemia, CKD, chronic dysphagia; admitted on 01/03/2017, presented with complaint of shortness of breath, was found to have acute on chronic systolic CHF due to a flutter with RVR.  Pt found to have pneumonia with pleural effusions. Nurse practitioner spoke to family and plan is for comfort care measures. Consulted Palliative to assist with symptom management. I have discontinued some of patient's home medication regimen, nurse reports patient is too somnolent to take anyhow.  Assessment and Plan: 1.  Acute hypoxic respiratory failure. Acute on chronic systolic CHF. A. fib with RVR. Most likely patient went into A. fib with RVR that led to CHF exacerbation. Also has effusions and pna - treat for comfort  2.  A. fib with RVR. - comfort care measures. Pt unable to take oral medications due to hypersomnolence   3.  Hypothyroidism. TSH stable - comfort care per palliative  4.  Chronic dysphagia. Patient is on dysphagia type II diet, nectar thick. - comfort care  5.  Chronic kidney disease stage III, renal function stable, monitor.  6.  Anemia of chronic disease. Monitor H&H no active bleeding right now.  Diet: Dysphagia type II diet nectar thick DVT Prophylaxis: subcutaneous Heparin  Advance goals of care discussion: DNR/DNI  Family Communication:   Disposition:  Comfort care measures.  Consultants: none Procedures: none  Antibiotics: Anti-infectives (From admission, onward)   Start     Dose/Rate Route  Frequency Ordered Stop   01/03/17 0630  vancomycin (VANCOCIN) 500 mg in sodium chloride 0.9 % 100 mL IVPB  Status:  Discontinued     500 mg 100 mL/hr over 60 Minutes Intravenous Every 24 hours 01/02/17 0619 01/02/17 1252   01/02/17 1000  fluconazole (DIFLUCAN) tablet 100 mg  Status:  Discontinued     100 mg Oral Daily 12/14/2016 2122 01-31-2016 1013   01/02/17 0630  vancomycin (VANCOCIN) IVPB 1000 mg/200 mL premix     1,000 mg 200 mL/hr over 60 Minutes Intravenous  Once 01/02/17 0619 01/02/17 0937   01/02/17 0630  piperacillin-tazobactam (ZOSYN) IVPB 3.375 g  Status:  Discontinued     3.375 g 12.5 mL/hr over 240 Minutes Intravenous Every 8 hours 01/02/17 0619 01/02/17 1252       Objective: Physical Exam: Vitals:   01-31-2016 0355 01-31-2016 0434 01-31-2016 0922 01-31-2016 0955  BP:      Pulse:      Resp:      Temp:      TempSrc:      SpO2: (!) 82% 97% 98% 96%  Weight:      Height:        Intake/Output Summary (Last 24 hours) at Jul 08, 2016 1015 Last data filed at 01/03/2017 1231 Gross per 24 hour  Intake 240 ml  Output -  Net 240 ml   Filed Weights   01/02/17 0137 01/03/17 2037 01-31-2016 0321  Weight: 59.7 kg (131 lb 9.8 oz) 58.4 kg (128 lb 12 oz) 57.2 kg (126 lb 1.7 oz)   General: Pt resting comfortably Cardiovascular: s1  and s2 present, no rubs Pulmonary: no increased wob, no wheezes Abdomen: non distended Skin: warm and dry  Data Reviewed: CBC: Recent Labs  Lab 11/04/16 1833 11/04/16 2144 01/02/17 0940 01/03/17 1054  WBC 13.4* 10.7* 7.5 18.3*  NEUTROABS 9.4*  --   --   --   HGB 10.4* 9.9* 10.8* 9.6*  HCT 31.8* 30.4* 32.0* 29.2*  MCV 91.4 90.7 90.4 91.3  PLT 350 285 317 332   Basic Metabolic Panel: Recent Labs  Lab 11/04/16 1833 11/04/16 2144 01/02/17 0940 01/03/17 1054  NA 133*  --  133* 133*  K 4.2  --  4.1 4.9  CL 100*  --  97* 98*  CO2 23  --  24 25  GLUCOSE 124*  --  175* 184*  BUN 15  --  14 28*  CREATININE 1.15* 1.19* 1.13* 1.36*  CALCIUM 9.2  --   9.0 9.4  MG  --   --   --  2.6*    Liver Function Tests: Recent Labs  Lab 11/04/16 1833  AST 27  ALT 16  ALKPHOS 68  BILITOT 0.7  PROT 6.5  ALBUMIN 3.0*   No results for input(s): LIPASE, AMYLASE in the last 168 hours. No results for input(s): AMMONIA in the last 168 hours. Coagulation Profile: No results for input(s): INR, PROTIME in the last 168 hours. Cardiac Enzymes: Recent Labs  Lab 11/04/16 2144 01/02/17 0220 01/02/17 0940  TROPONINI <0.03 <0.03 <0.03   BNP (last 3 results) No results for input(s): PROBNP in the last 8760 hours. CBG: No results for input(s): GLUCAP in the last 168 hours. Studies: Dg Chest Port 1 View  Result Date: 12/25/2016 CLINICAL DATA:  Acute onset of shortness of breath. EXAM: PORTABLE CHEST 1 VIEW COMPARISON:  Chest radiograph and CTA of the chest performed 01/02/2017 FINDINGS: New diffuse right-sided airspace opacification is compatible with pneumonia. The lungs are hypoexpanded. Small bilateral pleural effusions are again noted. No pneumothorax is seen. The cardiomediastinal silhouette is mildly enlarged. A pacemaker/AICD is noted overlying the left chest wall, with leads ending overlying the right atrium, right ventricle and coronary sinus. No acute osseous abnormalities are identified. IMPRESSION: 1. New right-sided pneumonia. Lungs hypoexpanded. Small bilateral pleural effusions again noted. 2. Mild cardiomegaly. Electronically Signed   By: Roanna RaiderJeffery  Chang M.D.   On: 12/15/2016 02:55    Scheduled Meds: . budesonide (PULMICORT) nebulizer solution  0.25 mg Nebulization BID  . diltiazem  120 mg Oral Daily  . furosemide      . ipratropium-albuterol  3 mL Nebulization Q6H  . lactose free nutrition  237 mL Oral BID BM  . lidocaine  2 patch Transdermal Q24H  .  morphine injection  2 mg Intravenous Once  . morphine       Continuous Infusions: . morphine 2 mg/hr (01/07/2017 0414)   PRN Meds: acetaminophen **OR** acetaminophen, levalbuterol,  ondansetron **OR** ondansetron (ZOFRAN) IV, RESOURCE THICKENUP CLEAR  Time spent: 20 minute Author: Penny PiaVEGA, Nigel Ericsson  [facility] Pager: [phone] 12/19/2016 10:15 AM  If 7PM-7AM, please contact night-coverage at [email or web address], password [email or web address]

## 2017-01-08 NOTE — Telephone Encounter (Signed)
Noted  

## 2017-01-08 NOTE — Progress Notes (Signed)
Shift event note:  Multiple notifications by RN since MN regarding increasing agitation and intermittent hypoxia. Pt refused to comply w/ NRM or VM however 02 sats in the 70-80's on [place] @ 6L. No improvement with neb. Minimal to know improvement w/ small doses of IV Ativan or IV Lasix. Stat PCXR obtained and revealed a new (R) sided PNA and persistent small bil pleural effusions. Went to bedside to discuss options w/ family (2 grand daughters) and POA (son-in-law). After much discussion regarding restarting abx w/ BiPAP which pt is not likely to tolerate the family made decision to transition to full comfort care. POA who is also an MD preferred morphine drip over IV boluses. Morphine drip initiated while this NP at bedside and pt noted resting calmly on 6L [place] w/ 02 sats of 92-96%. They have requested no invasive interventions such as labs etc. AM labs d/c'd. Family very appreciative of this discussion and were relieved to see pt resting calmly and comfortably. Assessment/Plan: 1. Acute Hypoxic respiratory failure: In clinical setting of 84 y/o pt w/ CHF and new (R) sided PNA. Pt DNR and family has decided to transition to full comfort care. Will defer further changes in plan to rounding MD.   Leanne ChangKatherine P. Catheleen Langhorne, NP-C [facility] Pager [phone]

## 2017-01-08 NOTE — Progress Notes (Addendum)
Pt having SOB. 02 @93 %on 6l.  On call notified order for 40mg  lasix IV.  Pt sat between 86 to 93 on venturi mask. HOB elevated. Family at bedside

## 2017-01-08 NOTE — Telephone Encounter (Signed)
Melanie [facility], calling would like to inform you that patient  "is in hospice." Patient is unable to send transmission.

## 2017-01-08 DEATH — deceased

## 2017-01-10 NOTE — Discharge Summary (Signed)
Death Summary  Laurann MontanaDorothy G Scarbrough [id number] DOB: 1921/07/23 DOA: 08/03/2016  PCP: Helane RimaWallace, Erica, DO   Admit date: 08/03/2016 Date of Death: 01/10/2017  Final Diagnoses:  Principal Problem:   Acute respiratory failure with hypoxia (HCC) Active Problems:   Essential (primary) hypertension   Hypothyroid   Atrial flutter with rapid ventricular response (HCC)   Acute on chronic systolic CHF (congestive heart failure) (HCC)   History of present illness:  Pt. with PMH of CAD S/P PCI, chronic systolic CHF, a flutter not on any anticoagulation due to fall risk, anemia, CKD, chronic dysphagia; admitted on 08/03/2016, presented with complaint of shortness of breath, was found to have acute on chronic systolic CHF due to a flutter with RVR.  Pt found to have pneumonia with pleural effusions. Nurse practitioner spoke to family and plan is for comfort care measures. Consulted Palliative to assist with symptom management.  Hospital Course:  1.  Acute hypoxic respiratory failure. Acute on chronic systolic CHF. A. fib with RVR. Most likely patient went into A. fib with RVR that led to CHF exacerbation. Also has effusions and pna - treated for comfort  2.  A. fib with RVR. - comfort care measures. Pt unable to take oral medications due to hypersomnolence   3.  Hypothyroidism. TSH stable - comfort care per palliative  4.  Chronic dysphagia. Patient is on dysphagia type II diet, nectar thick. - comfort care  5.  Chronic kidney disease stage III, renal function stable, monitor.  6.  Anemia of chronic disease.  Time: > 20 min  Signed:  Penny PiaVEGA, Jenika Chiem  [facility] 01/10/2017, 8:37 AM

## 2017-10-14 ENCOUNTER — Ambulatory Visit: Payer: Self-pay | Admitting: Family Medicine

## 2018-06-15 IMAGING — US US RENAL
1 series · 14 of 25 positions shown · non-contrast
Comparison: CT 12/05/2016

CLINICAL DATA: Acute renal failure.

EXAM:
RENAL / URINARY TRACT ULTRASOUND COMPLETE

[Series 1: us renal · 0.23mm/px · 14 of 49 slices shown]
[im 1/49]
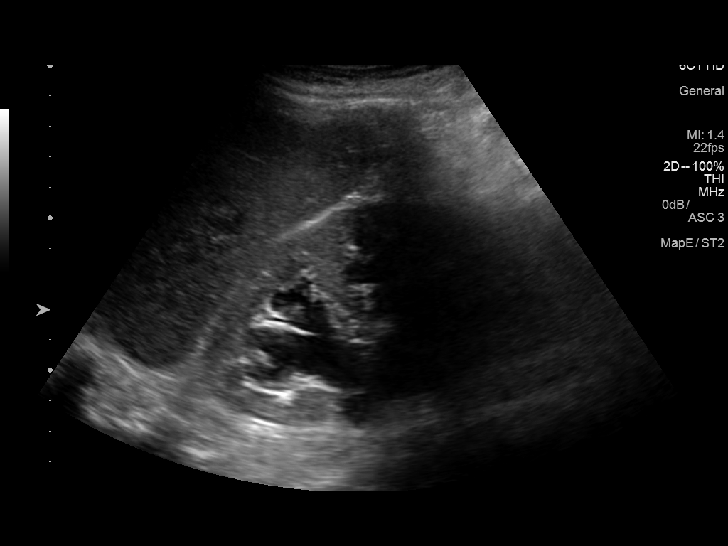
[im 5/49]
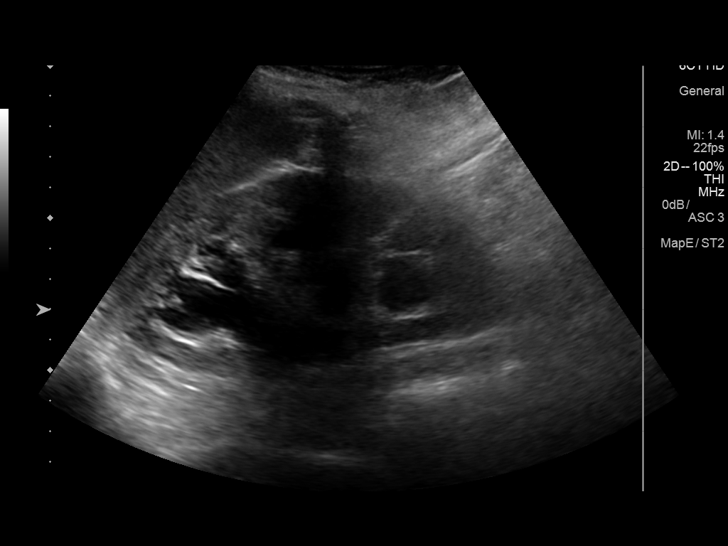
[im 9/49]
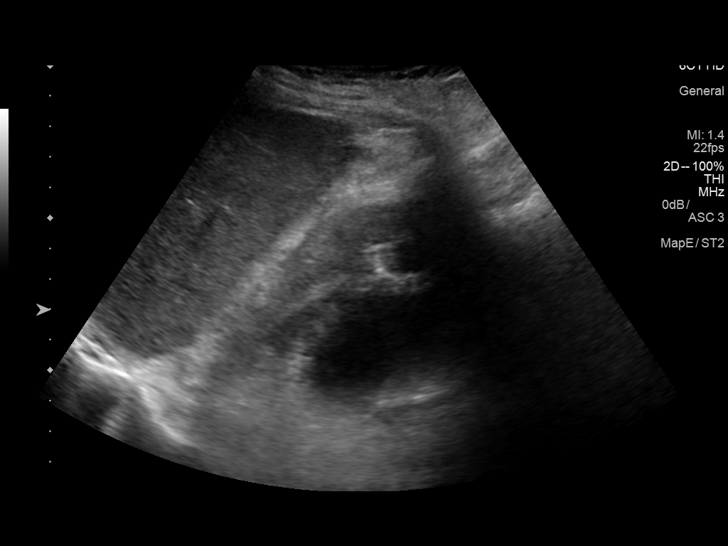
[im 13/49]
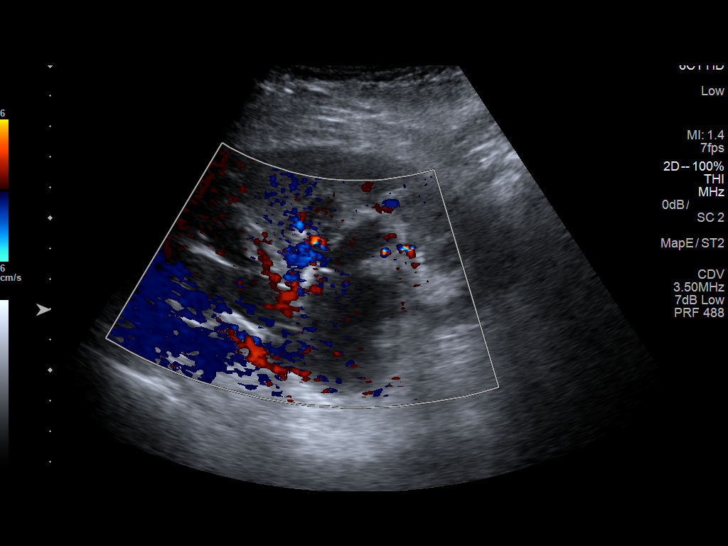
[im 17/49]
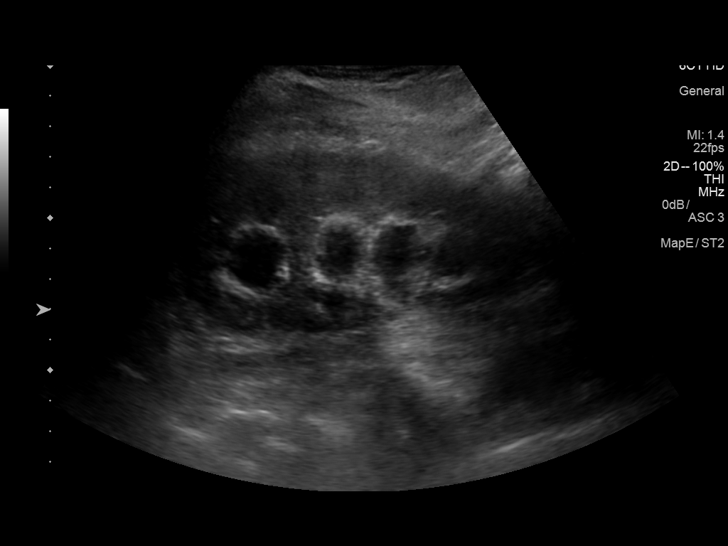
[im 19/49]
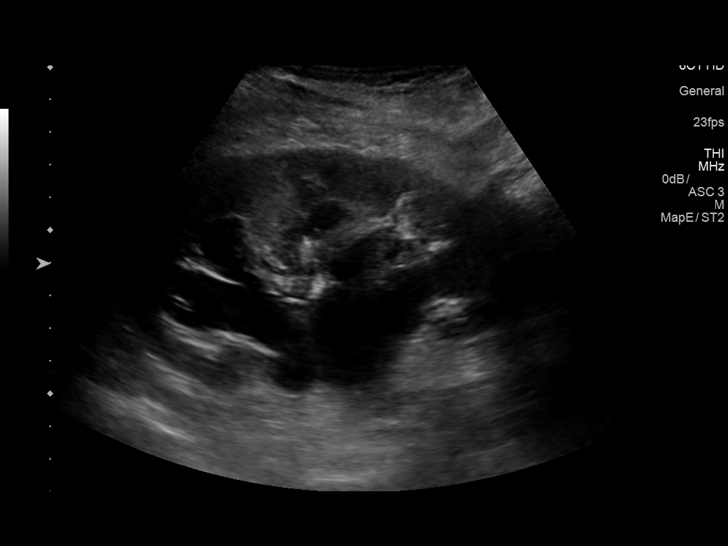
[im 23/49]
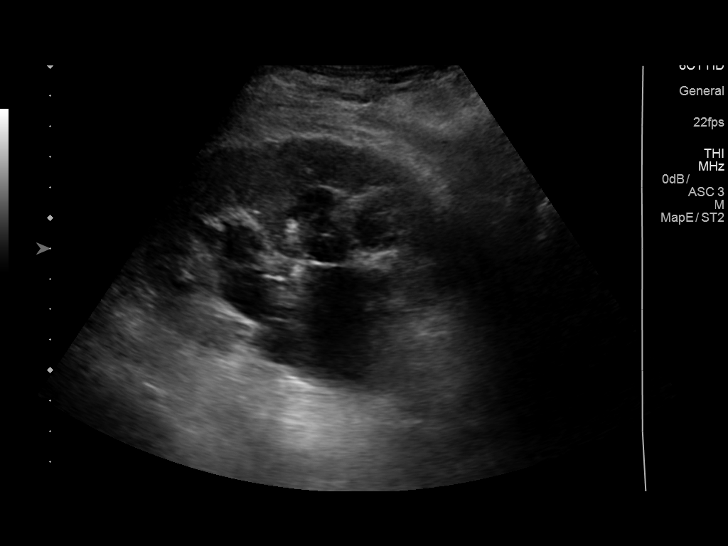
[im 27/49]
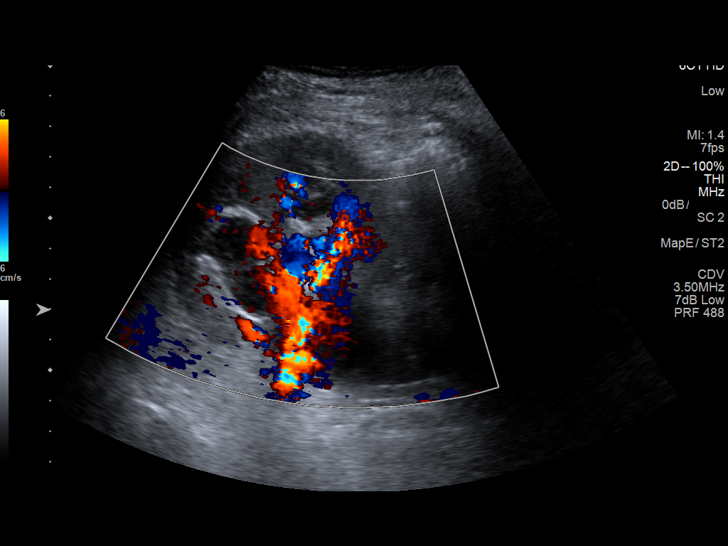
[im 31/49]
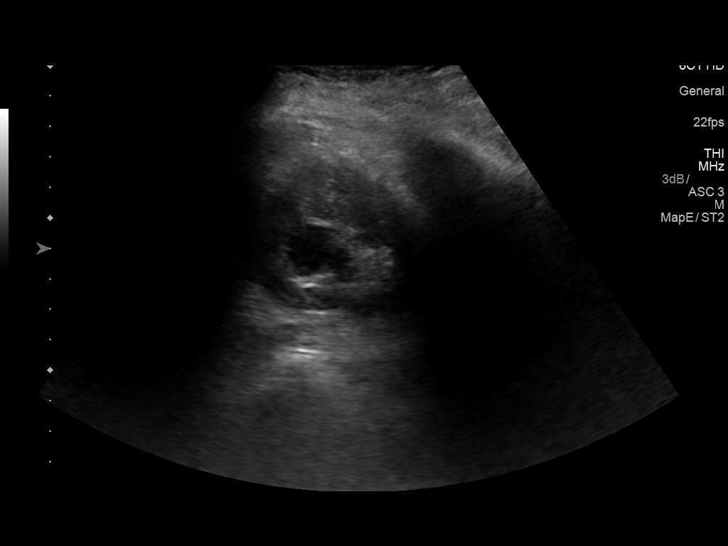
[im 33/49]
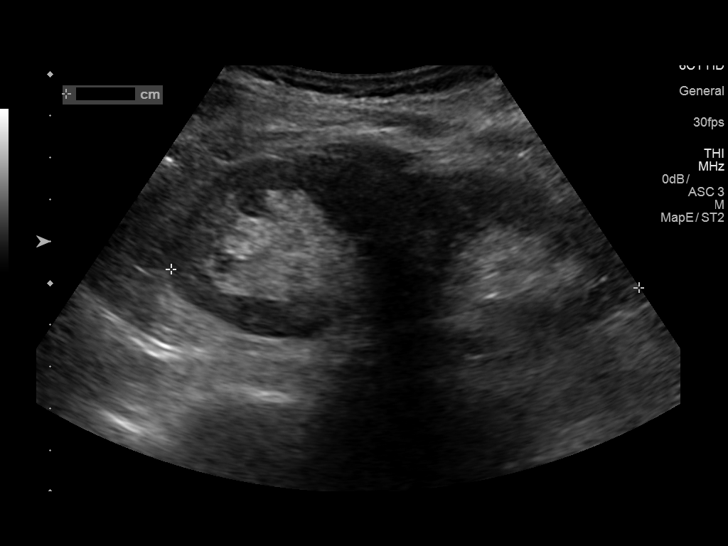
[im 37/49]
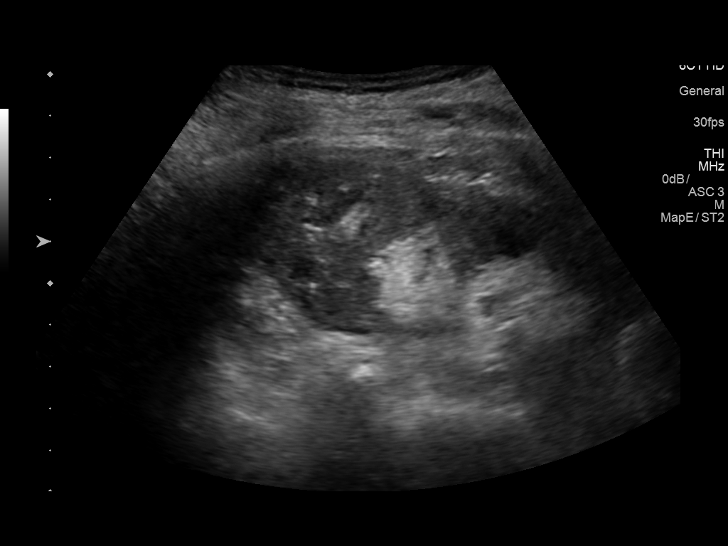
[im 41/49]
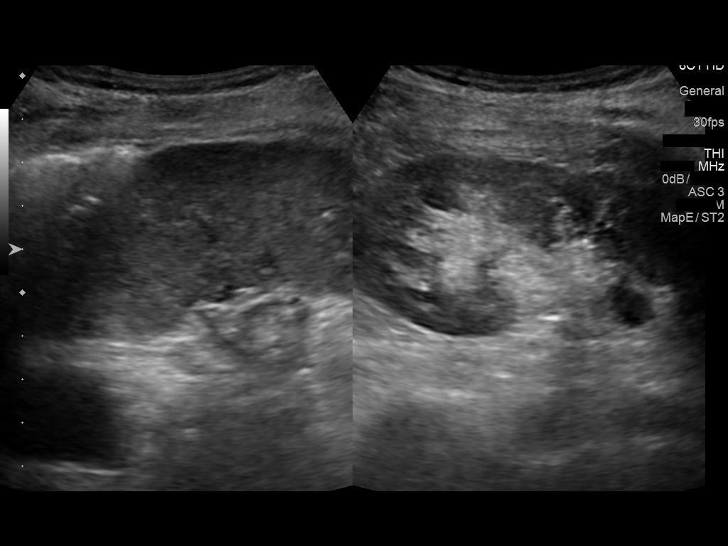
[im 45/49]
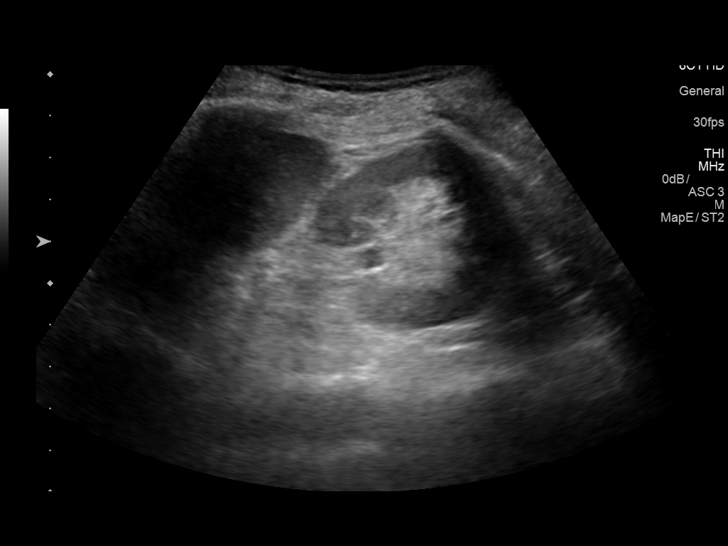
[im 49/49]
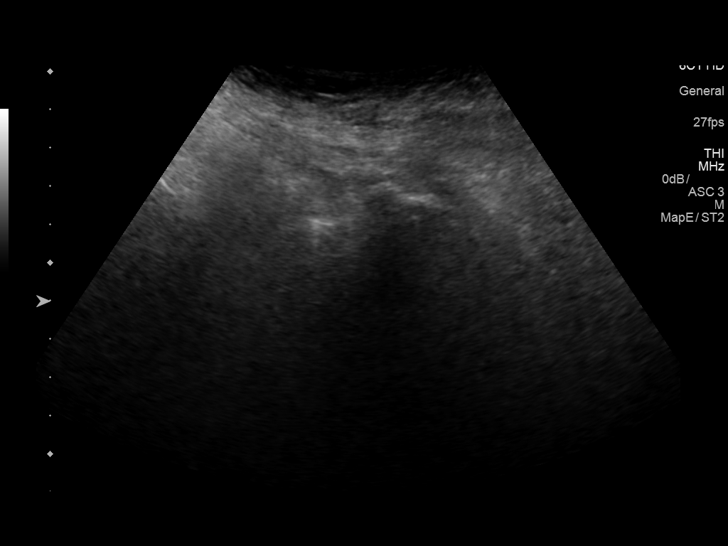

[14 of 25 positions shown; findings below may reference images not displayed]

FINDINGS: Right Kidney:

Length: 13.0 cm. Moderate right hydronephrosis. Right renal pelvis
measures 2.1 cm. Nonobstructing calculi on prior CT are not well
seen sonographically.

Left Kidney:

Length: 11.2 cm. Echogenicity within normal limits. No mass or
hydronephrosis visualized.

Bladder:

Not visualized.  Bladder stone on prior CT is not seen.
IMPRESSION: 1. Persistent moderate right hydronephrosis, similar to CT 2.5 weeks
prior allowing for differences in modality.
2. Bilateral renal calculi on prior CT are not well seen
sonographically.
3. Bladder is not visualized, presumably empty.
